# Patient Record
Sex: Male | Born: 1955 | Race: White | Hispanic: No | Marital: Married | Smoking: Never smoker
Health system: Southern US, Community
[De-identification: ages and names within clinical notes are randomized; demographics above are authoritative.]

## PROBLEM LIST (undated history)

## (undated) DIAGNOSIS — R011 Cardiac murmur, unspecified: Secondary | ICD-10-CM

## (undated) DIAGNOSIS — I4510 Unspecified right bundle-branch block: Secondary | ICD-10-CM

## (undated) DIAGNOSIS — I421 Obstructive hypertrophic cardiomyopathy: Secondary | ICD-10-CM

## (undated) DIAGNOSIS — I441 Atrioventricular block, second degree: Secondary | ICD-10-CM

## (undated) DIAGNOSIS — I442 Atrioventricular block, complete: Secondary | ICD-10-CM

## (undated) DIAGNOSIS — G35 Multiple sclerosis: Principal | ICD-10-CM

## (undated) DIAGNOSIS — R0602 Shortness of breath: Secondary | ICD-10-CM

## (undated) DIAGNOSIS — N400 Enlarged prostate without lower urinary tract symptoms: Secondary | ICD-10-CM

## (undated) HISTORY — DX: Benign prostatic hyperplasia without lower urinary tract symptoms: N40.0

## (undated) HISTORY — DX: Atrioventricular block, complete: I44.2

## (undated) HISTORY — DX: Multiple sclerosis: G35

## (undated) HISTORY — DX: Atrioventricular block, second degree: I44.1

## (undated) HISTORY — DX: Unspecified right bundle-branch block: I45.10

## (undated) HISTORY — DX: Shortness of breath: R06.02

## (undated) HISTORY — PX: OTHER SURGICAL HISTORY: SHX169

## (undated) HISTORY — DX: Obstructive hypertrophic cardiomyopathy: I42.1

## (undated) HISTORY — DX: Cardiac murmur, unspecified: R01.1

---

## 1996-07-06 DIAGNOSIS — G35 Multiple sclerosis: Secondary | ICD-10-CM

## 1996-07-06 HISTORY — DX: Multiple sclerosis: G35

## 2004-07-17 ENCOUNTER — Ambulatory Visit: Payer: Self-pay | Admitting: Internal Medicine

## 2004-07-21 ENCOUNTER — Ambulatory Visit: Payer: Self-pay | Admitting: Internal Medicine

## 2005-10-23 ENCOUNTER — Ambulatory Visit: Payer: Self-pay | Admitting: Hospitalist

## 2006-07-06 HISTORY — PX: LEG TENDON SURGERY: SHX1004

## 2006-07-18 ENCOUNTER — Emergency Department (HOSPITAL_COMMUNITY): Admission: EM | Admit: 2006-07-18 | Discharge: 2006-07-18 | Payer: Self-pay | Admitting: Emergency Medicine

## 2006-09-16 ENCOUNTER — Encounter (INDEPENDENT_AMBULATORY_CARE_PROVIDER_SITE_OTHER): Payer: Self-pay | Admitting: Internal Medicine

## 2006-10-30 ENCOUNTER — Emergency Department (HOSPITAL_COMMUNITY): Admission: EM | Admit: 2006-10-30 | Discharge: 2006-10-30 | Payer: Self-pay | Admitting: Emergency Medicine

## 2006-12-11 ENCOUNTER — Encounter (INDEPENDENT_AMBULATORY_CARE_PROVIDER_SITE_OTHER): Payer: Self-pay | Admitting: Internal Medicine

## 2007-01-12 ENCOUNTER — Encounter (INDEPENDENT_AMBULATORY_CARE_PROVIDER_SITE_OTHER): Payer: Self-pay | Admitting: Internal Medicine

## 2007-02-14 ENCOUNTER — Encounter (INDEPENDENT_AMBULATORY_CARE_PROVIDER_SITE_OTHER): Payer: Self-pay | Admitting: Internal Medicine

## 2007-07-07 DIAGNOSIS — I421 Obstructive hypertrophic cardiomyopathy: Secondary | ICD-10-CM

## 2007-07-07 HISTORY — DX: Obstructive hypertrophic cardiomyopathy: I42.1

## 2007-12-05 DIAGNOSIS — I442 Atrioventricular block, complete: Secondary | ICD-10-CM

## 2007-12-05 HISTORY — PX: PACEMAKER INSERTION: SHX728

## 2007-12-05 HISTORY — DX: Atrioventricular block, complete: I44.2

## 2007-12-12 ENCOUNTER — Telehealth (INDEPENDENT_AMBULATORY_CARE_PROVIDER_SITE_OTHER): Payer: Self-pay | Admitting: Internal Medicine

## 2007-12-12 ENCOUNTER — Encounter: Payer: Self-pay | Admitting: Cardiology

## 2007-12-12 ENCOUNTER — Ambulatory Visit: Payer: Self-pay | Admitting: Internal Medicine

## 2007-12-12 ENCOUNTER — Inpatient Hospital Stay (HOSPITAL_COMMUNITY): Admission: EM | Admit: 2007-12-12 | Discharge: 2007-12-16 | Payer: Self-pay | Admitting: Emergency Medicine

## 2007-12-12 ENCOUNTER — Ambulatory Visit: Payer: Self-pay | Admitting: Cardiology

## 2007-12-13 ENCOUNTER — Encounter: Payer: Self-pay | Admitting: Internal Medicine

## 2007-12-16 ENCOUNTER — Encounter: Payer: Self-pay | Admitting: Internal Medicine

## 2007-12-29 ENCOUNTER — Ambulatory Visit: Payer: Self-pay

## 2008-01-05 ENCOUNTER — Encounter (INDEPENDENT_AMBULATORY_CARE_PROVIDER_SITE_OTHER): Payer: Self-pay | Admitting: Internal Medicine

## 2008-01-05 ENCOUNTER — Ambulatory Visit: Payer: Self-pay | Admitting: Internal Medicine

## 2008-01-12 ENCOUNTER — Ambulatory Visit: Payer: Self-pay | Admitting: Internal Medicine

## 2008-01-12 ENCOUNTER — Ambulatory Visit: Payer: Self-pay

## 2008-01-12 ENCOUNTER — Encounter: Payer: Self-pay | Admitting: Cardiology

## 2008-01-12 ENCOUNTER — Encounter: Payer: Self-pay | Admitting: Internal Medicine

## 2008-01-13 ENCOUNTER — Ambulatory Visit: Payer: Self-pay

## 2008-01-30 ENCOUNTER — Ambulatory Visit: Payer: Self-pay | Admitting: Internal Medicine

## 2008-02-09 ENCOUNTER — Encounter (INDEPENDENT_AMBULATORY_CARE_PROVIDER_SITE_OTHER): Payer: Self-pay | Admitting: Internal Medicine

## 2008-02-13 ENCOUNTER — Ambulatory Visit (HOSPITAL_COMMUNITY): Admission: RE | Admit: 2008-02-13 | Discharge: 2008-02-13 | Payer: Self-pay | Admitting: Internal Medicine

## 2008-02-21 ENCOUNTER — Encounter (INDEPENDENT_AMBULATORY_CARE_PROVIDER_SITE_OTHER): Payer: Self-pay | Admitting: Internal Medicine

## 2008-03-20 ENCOUNTER — Ambulatory Visit: Payer: Self-pay | Admitting: Internal Medicine

## 2008-04-16 ENCOUNTER — Encounter (INDEPENDENT_AMBULATORY_CARE_PROVIDER_SITE_OTHER): Payer: Self-pay | Admitting: Internal Medicine

## 2008-04-18 ENCOUNTER — Ambulatory Visit: Payer: Self-pay

## 2008-04-18 ENCOUNTER — Encounter: Payer: Self-pay | Admitting: Cardiology

## 2008-04-25 ENCOUNTER — Ambulatory Visit: Payer: Self-pay | Admitting: Internal Medicine

## 2008-06-12 ENCOUNTER — Ambulatory Visit: Payer: Self-pay | Admitting: Internal Medicine

## 2008-06-14 ENCOUNTER — Ambulatory Visit: Payer: Self-pay

## 2008-08-31 ENCOUNTER — Ambulatory Visit: Payer: Self-pay | Admitting: Internal Medicine

## 2008-09-14 ENCOUNTER — Encounter: Payer: Self-pay | Admitting: Internal Medicine

## 2008-09-21 ENCOUNTER — Ambulatory Visit: Payer: Self-pay | Admitting: Internal Medicine

## 2008-09-21 ENCOUNTER — Encounter: Payer: Self-pay | Admitting: Internal Medicine

## 2008-10-18 ENCOUNTER — Telehealth (INDEPENDENT_AMBULATORY_CARE_PROVIDER_SITE_OTHER): Payer: Self-pay | Admitting: *Deleted

## 2008-11-02 ENCOUNTER — Ambulatory Visit: Payer: Self-pay

## 2008-11-02 ENCOUNTER — Encounter: Payer: Self-pay | Admitting: Internal Medicine

## 2008-12-18 ENCOUNTER — Ambulatory Visit: Payer: Self-pay | Admitting: Internal Medicine

## 2009-03-26 ENCOUNTER — Encounter (INDEPENDENT_AMBULATORY_CARE_PROVIDER_SITE_OTHER): Payer: Self-pay | Admitting: *Deleted

## 2009-03-28 ENCOUNTER — Ambulatory Visit: Payer: Self-pay | Admitting: Internal Medicine

## 2009-04-25 ENCOUNTER — Encounter: Payer: Self-pay | Admitting: Internal Medicine

## 2009-06-25 ENCOUNTER — Encounter: Payer: Self-pay | Admitting: Internal Medicine

## 2009-06-25 ENCOUNTER — Ambulatory Visit: Payer: Self-pay

## 2009-06-25 ENCOUNTER — Telehealth (INDEPENDENT_AMBULATORY_CARE_PROVIDER_SITE_OTHER): Payer: Self-pay | Admitting: *Deleted

## 2009-06-26 ENCOUNTER — Ambulatory Visit: Payer: Self-pay | Admitting: Internal Medicine

## 2009-07-08 ENCOUNTER — Telehealth: Payer: Self-pay | Admitting: Internal Medicine

## 2009-07-11 ENCOUNTER — Encounter: Payer: Self-pay | Admitting: Internal Medicine

## 2009-07-11 ENCOUNTER — Ambulatory Visit: Payer: Self-pay

## 2009-07-11 ENCOUNTER — Encounter: Payer: Self-pay | Admitting: Cardiovascular Disease

## 2009-07-11 ENCOUNTER — Encounter (HOSPITAL_COMMUNITY): Admission: RE | Admit: 2009-07-11 | Discharge: 2009-09-09 | Payer: Self-pay | Admitting: Internal Medicine

## 2009-07-11 ENCOUNTER — Ambulatory Visit: Payer: Self-pay | Admitting: Internal Medicine

## 2009-07-12 ENCOUNTER — Telehealth (INDEPENDENT_AMBULATORY_CARE_PROVIDER_SITE_OTHER): Payer: Self-pay | Admitting: *Deleted

## 2009-07-12 LAB — CONVERTED CEMR LAB
AST: 27 units/L (ref 0–37)
Bilirubin, Direct: 0 mg/dL (ref 0.0–0.3)
LDL Cholesterol: 111 mg/dL — ABNORMAL HIGH (ref 0–99)
Total Bilirubin: 1.2 mg/dL (ref 0.3–1.2)
VLDL: 18 mg/dL (ref 0.0–40.0)

## 2009-08-06 ENCOUNTER — Ambulatory Visit: Payer: Self-pay | Admitting: Internal Medicine

## 2009-08-07 ENCOUNTER — Telehealth: Payer: Self-pay | Admitting: Internal Medicine

## 2009-08-09 ENCOUNTER — Encounter: Payer: Self-pay | Admitting: Internal Medicine

## 2009-08-13 ENCOUNTER — Telehealth: Payer: Self-pay | Admitting: Internal Medicine

## 2009-08-19 ENCOUNTER — Encounter: Payer: Self-pay | Admitting: Cardiovascular Disease

## 2009-09-25 ENCOUNTER — Ambulatory Visit: Payer: Self-pay | Admitting: Internal Medicine

## 2009-10-01 ENCOUNTER — Encounter: Payer: Self-pay | Admitting: Internal Medicine

## 2009-11-06 ENCOUNTER — Encounter: Payer: Self-pay | Admitting: Internal Medicine

## 2009-11-13 ENCOUNTER — Encounter: Payer: Self-pay | Admitting: Internal Medicine

## 2010-01-28 ENCOUNTER — Ambulatory Visit: Payer: Self-pay | Admitting: Internal Medicine

## 2010-05-02 ENCOUNTER — Encounter: Payer: Self-pay | Admitting: Internal Medicine

## 2010-05-03 ENCOUNTER — Encounter: Payer: Self-pay | Admitting: Internal Medicine

## 2010-05-05 ENCOUNTER — Ambulatory Visit: Payer: Self-pay | Admitting: Internal Medicine

## 2010-05-21 ENCOUNTER — Encounter: Payer: Self-pay | Admitting: Internal Medicine

## 2010-07-24 ENCOUNTER — Telehealth: Payer: Self-pay | Admitting: Internal Medicine

## 2010-08-05 NOTE — Cardiovascular Report (Signed)
Summary: Office Visit Remote   Office Visit Remote   Imported By: Roderic Ovens 07/17/2009 14:57:01  _____________________________________________________________________  External Attachment:    Type:   Image     Comment:   External Document

## 2010-08-05 NOTE — Letter (Signed)
Summary: Remote Device Check  [employer], Main Office  [address]   [place], [place] [address]   Phone: [phone]  Fax: [phone]     July 11, 2009 MRN: [id number]   Rodney Adkins [address] [place], [place]  [address]   Dear Mr. Barbian,   Your remote transmission was recieved and reviewed by your physician.  All diagnostics were within normal limits for you.  __X___Your next transmission is scheduled for:     September 25, 2009.  Please transmit at any time this day.  If you have a wireless device your transmission will be sent automatically.     Sincerely,  Proofreader

## 2010-08-05 NOTE — Progress Notes (Signed)
Summary: [facility]   Imported By: Harlon Flor 08/28/2009 11:24:39  _____________________________________________________________________  External Attachment:    Type:   Image     Comment:   External Document

## 2010-08-05 NOTE — Letter (Signed)
Summary: Remote Device Check  [facility] [employer], Main Office  [address]   [place], [place] [address]   Phone: [phone]  Fax: [phone]     Nov 13, 2009 MRN: [id number]   Rodney Adkins [address] [place], [place]  [address]   Dear Mr. Bergland,   Your remote transmission was recieved and reviewed by your physician.  All diagnostics were within normal limits for you.   __X____Your next office visit is scheduled for:    August 2011. Please call our office to schedule an appointment.    Sincerely,  Vella Kohler

## 2010-08-05 NOTE — Letter (Signed)
Summary: Remote Device Check  [employer], Main Office  [address]   [place], [place] [address]   Phone: [phone]  Fax: [phone]     October 01, 2009 MRN: [id number]   JAYAN RAYMUNDO [address] [place], [place]  [address]   Dear Mr. Gunnarson,   Your remote transmission was recieved and reviewed by your physician.  All diagnostics were within normal limits for you.  __X___Your next transmission is scheduled for:   Nov 06, 2009.  Please transmit at any time this day.  If you have a wireless device your transmission will be sent automatically.      Sincerely,  Proofreader

## 2010-08-05 NOTE — Letter (Signed)
Summary: [facility] [employer], newspaper, Main Office  [address]   [place], [place] [address]   Phone: [phone]  Fax: [phone]     May 02, 2010 MRN: [id number]   ZACCARY CREECH [address] [place], [place]  [address]   Dear Mr. Theys,  According to our records, you were scheduled for a device phone transmission on 05-01-2010                             .     We did not receive any results from this check.  If you transmitted on your scheduled day, please call us to help troubleshoot your system.  If you forgot to send your transmission, please send one upon receipt of this letter.  Thank you,   Architectural technologist [facility]

## 2010-08-05 NOTE — Cardiovascular Report (Signed)
Summary: Office Visit   Office Visit   Imported By: Roderic Ovens 08/13/2009 11:22:45  _____________________________________________________________________  External Attachment:    Type:   Image     Comment:   External Document

## 2010-08-05 NOTE — Progress Notes (Signed)
Summary:  pt having SOB and wants to talk to someoone  Phone Note Call from Patient Call back at [phone]   Caller: Mom Reason for Call: Talk to Nurse, Talk to Doctor Summary of Call: pt is is having SOB and it is getting worse and he wants to talk to someone regarding this Initial call taken by: Omer Jack,  August 13, 2009 8:40 AM  Follow-up for Phone Call        S/W wife and they have an appt with Dr. Regino Schultze at [facility] but it isn't until Aprill 11, 2011. Mr. Bologna SOB was "Worse than it has ever been," yesterday. He is feeling some better today. Wants to know if there is anything that Dr. Graciela Husbands can do to get him in to see Dr. Regino Schultze any sooner. Also wants to know if they need to call the [facility] for Dr. Erenest Blank or if Dr. Graciela Husbands had gotten a change to contact him. She is aware that it will be late today when I s/w Dr. Graciela Husbands. Duncan Dull, RN, BSN  August 13, 2009 12:11 PM   Additional Follow-up for Phone Call Additional follow up Details #1::        Dr. Graciela Husbands s/w Meriam Sprague and let her know that he would be glad to get with Dr. Regino Schultze and let him know that SOB continues to be a problem with Mr. Ridlon. He also gave her the name of Dr. Forestine Na at [facility] whose specialty is Cardiac Genetics to call if she would like to call him in reference to gene testing.  Additional Follow-up by: Duncan Dull, RN, BSN,  August 13, 2009 6:17 PM

## 2010-08-05 NOTE — Cardiovascular Report (Signed)
Summary: Office Visit Remote   Office Visit Remote   Imported By: Roderic Ovens 10/03/2009 12:00:13  _____________________________________________________________________  External Attachment:    Type:   Image     Comment:   External Document

## 2010-08-05 NOTE — Progress Notes (Signed)
Summary:  regarding hocm  Phone Note Call from Patient Call back at Home Phone [phone] Call back at [phone]   Caller: Spouse-beverly Reason for Call: Talk to Nurse Details for Reason: pt was seen on yesterday, question regarding hocm - test.  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 I will s/w Dr. Graciela Husbands tomorrow.  Follow-up by: Duncan Dull, RN, BSN,  August 07, 2009 5:46 PM  Additional Follow-up for Phone Call Additional follow up Details #1::        Read next phone note Additional Follow-up by: Duncan Dull, RN, BSN,  August 13, 2009 6:17 PM

## 2010-08-05 NOTE — Progress Notes (Signed)
  Phone Note Outgoing Call   Call placed by: Duncan Dull, RN, BSN,  July 12, 2009 3:14 PM Call placed to: Patient Summary of Call: Called patient and left message on machine  To discuss Nuc. Study and labs Duncan Dull, RN, BSN  July 12, 2009 3:15 PM   Follow-up for Phone Call        Pt aware of Nuc Study and labs. Labs mailed at pt request Follow-up by: Duncan Dull, RN, BSN,  July 12, 2009 4:08 PM

## 2010-08-05 NOTE — Progress Notes (Signed)
[facility]: [facility]   Imported By: Harlon Flor 08/28/2009 11:25:04  _____________________________________________________________________  External Attachment:    Type:   Image     Comment:   External Document

## 2010-08-05 NOTE — Cardiovascular Report (Signed)
Summary: Office Visit Remote   Office Visit Remote   Imported By: Roderic Ovens 05/23/2010 14:04:45  _____________________________________________________________________  External Attachment:    Type:   Image     Comment:   External Document

## 2010-08-05 NOTE — Progress Notes (Signed)
  Phone Note Call from Patient Call back at Home Phone [phone] Call back at [phone]   Caller: Spouse Reason for Call: Talk to Nurse Summary of Call: chest tightness, pain and sob ongoing for a couple of weeks Initial call taken by: Migdalia Dk,  July 08, 2009 8:45 AM  Follow-up for Phone Call        Pt wants to see seen by a Cardiologist for a Nuc Stress Test to see if he has had an "event."  His appt with Dr. Graciela Husbands is not until Feb. I will s/w Dr. Graciela Husbands this afternoon and see what he suggests. Pt aware of plan.  Follow-up by: Duncan Dull, RN, BSN,  July 08, 2009 9:12 AM  Additional Follow-up for Phone Call Additional follow up Details #1::        S/w Dr. Graciela Husbands and pt was scheduled for a Nuc Stress and he will see Dr. Graciela Husbands as scheduled. Pt aware of and agrees with plan.  Additional Follow-up by: Duncan Dull, RN, BSN,  July 09, 2009 5:31 PM  New Problems: CHEST PAIN, INTERMITTENT (ICD-786.50)   New Problems: CHEST PAIN, INTERMITTENT (ICD-786.50)

## 2010-08-05 NOTE — Assessment & Plan Note (Signed)
Summary: rov/per pt call/jss   CC:  rov/ patient getting device checked and is reported lower than normal blood pressures..  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 After discussion with Dr. Regino Schultze at [facility] we undertook stress echo looking to see the effects on the mitral valve and left ventricular outflow tract. Nothing was noted by Dr. Myrtis Ser. he noted that there was a rapid heart rate with activity. The patient has intrinsic sinus function. He has had concerns about chest pain and recently underwent Myoview scanning that was normal with an ejection fraction of 65%.    He also has complaints of breathlessness occurring when he lies down at night. This occurred shortly after becoming recumbent. He then has to go to sleep propped up. His wife notes that sometimes he awakens 3-4 hours into the night and has to sit up to regain his breath. He continues to have some breathlessness with exertion. There has been no peripheral edema. They have noted that his blood pressures are relatively low at recent visits with systolics in the high 90s. He has not had significant lightheadedness dizziness or syncope     Current Medications (verified): 1)  Ativan 1 Mg  Tabs (Lorazepam) .... 1/2 To 1mg  At Night As Needed For Sleep 2)  Aspirin 81 Mg  Tbec (Aspirin) .... One By Mouth Every Day 3)  Fish Oil   Oil (Fish Oil) .... Qd 4)  Vitamin C 500 Mg  Tabs (Ascorbic Acid) .... By Mouth Bid 5)  Multivitamins  Tabs (Multiple Vitamin) .... Take One Tablet Once Daily  Allergies (verified): No Known Drug Allergies  Past History:  Past Medical History: Last updated: 09/18/2008 Multiple sclerosis dyspnea Murmur palpitation dizziness Flash pulmonary edema Right bundle-branch block HOCM  Past Surgical History: Last updated: 09/18/2008 pacemaker--Medtronic adapta ADDr4 --12/13/07  Family History: Last updated:  09/18/2008  His parents are both in their 66s and neither one has a   history of coronary artery disease.  There is no family history of   sudden death.  He has 1 brother who does not have heart disease.  There   is no family member that has required a pacemaker or other device of   which he is aware.   Social History: Last updated: 09/18/2008 Married  Tobacco Use - No.  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 HEENT Normal.  Neck veins were 7-carotids were brisk.  Lungs were clear.  Heart sounds were regular with a high-pitched 2-3/6 systolic murmur heard at the left lower sternal border that is accentuated by Valsalva and relieved with release Abdomen was soft with active bowel sounds. There is no clubbing cyanosis or edema. Skin Warm and dry    PPM Specifications Following MD:  Sherryl Manges, MD     PPM Vendor:  Medtronic     PPM Model Number:  ADDRL1  PPM Serial Number:  [id number] PPM DOI:  12/12/2007     PPM Implanting MD:  Sherryl Manges, MD  Lead 1    Location: RA     DOI: 12/12/2007     Model #: 9147     Serial #: [id number]     Status: active Lead 2    Location: RV     DOI: 12/12/2007     Model #: 6578     Serial #: [id number]     Status: active  Magnet Response Rate:  BOL 85 ERI  65  Indications:  CHB; HOCM  Explantation Comments:  Pacemaker dependent  PPM Follow Up Remote Check?  No Battery Voltage:  2.80 V     Battery Est. Longevity:  7.5 years     Pacer Dependent:  Yes       PPM Device Measurements Atrium  Amplitude: 5.6 mV,  Impedance: 545 ohms, Threshold: 0.375 V at 0.4 msec Right Ventricle  Impedance: 521 ohms, Threshold: 0.625 V at 0.4 msec  Episodes MS Episodes:  3     Percent Mode Switch:  <0.1%     Coumadin:  No Ventricular High Rate:  0     Atrial Pacing:  21.3%     Ventricular Pacing:  100%  Parameters Mode:  DDD     Lower Rate Limit:  60     Upper Rate Limit:  160 Paced AV Delay:  180     Sensed AV Delay:  150 Tech Comments:  Mode switch collection delay  changed to zero.  Carelink transmissions every 3 months.  ROV 6 months Dr. Graciela Husbands. Altha Harm, LPN  August 06, 2009 10:46 AM   Impression & Recommendations:  Problem # 1:  PAROXYSMAL NOCTURNAL DYSPNEA (ICD-786.09) I am bothered by this. He has mild jugular venous distention suggesting a little bit of right sided fluid overload. Recumbent dyspnea suggests left-sided fluid overload. He has normal left ventricular function this would be occurring in the context of diastolic abnormalities. We could certainly explain this related to his hypertrophic cardiopathy and possibly aggravation by pacing induced asynchrony. However, given the difficulties we have had with establishing a diagnosis I suggested that there may be value in having him seen by hypertrophic cardiomyopathy expert. We have discussed possibly going to the [facility] to see Dr. Erenest Blank.  For right now we will hold off on any symptomatic relief with either diuretics or nocturnal nitroglycerin as his blood pressures are low and I were a little bit about aggravation of his gradient His updated medication list for this problem includes:    Aspirin 81 Mg Tbec (Aspirin) ..... One by mouth every day  Problem # 2:  ATRIAL TACHYCARDIA (ICD-427.0) interrogation of his device demonstrated atrial tachycardia these were relatively brief there is a 2 episodes less than one minute one episode less than 10 minutes. Interestingly one of these occasions was associated with one to one antegrade conduction. I  do not think that this represented ventricular tachycardia as atrial rates were similar between episodes of conduction and heart block His updated medication list for this problem includes:    Aspirin 81 Mg Tbec (Aspirin) ..... One by mouth every day  Problem # 3:  PACEMAKER, PERMANENT (ICD-V45.01) Device parameters and data were reviewed .  we program the device to mode switch +0 his pulse  to MS + 30 to see if wecould  identify why was that the device is mode switching  Problem # 4:  ATRIOVENTRICULAR BLOCK,  COMPLETE (ICD-426.0) as above His updated medication list for this problem includes:    Aspirin 81 Mg Tbec (Aspirin) ..... 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 One by mouth every day

## 2010-08-05 NOTE — Letter (Signed)
Summary: Remote Device Check  [employer], Main Office  [address]   [place], [place] [address]   Phone: [phone]  Fax: [phone]     May 21, 2010 MRN: [id number]   ELENO WEIMAR [address] [place], [place]  [address]   Dear Mr. Sarwar,   Your remote transmission was recieved and reviewed by your physician.  All diagnostics were within normal limits for you.  __X___Your next transmission is scheduled for:   08-07-2010.  Please transmit at any time this day.  If you have a wireless device your transmission will be sent automatically.   Sincerely,  Vella Kohler

## 2010-08-05 NOTE — Cardiovascular Report (Signed)
Summary: Office Visit Remote   Office Visit Remote   Imported By: Roderic Ovens 11/13/2009 13:37:01  _____________________________________________________________________  External Attachment:    Type:   Image     Comment:   External Document

## 2010-08-05 NOTE — Progress Notes (Signed)
[facility]   Imported By: Harlon Flor 08/22/2009 12:41:18  _____________________________________________________________________  External Attachment:    Type:   Image     Comment:   External Document

## 2010-08-05 NOTE — Assessment & Plan Note (Signed)
Summary: pc2   CC:  pc2.  Pt states he is feeling well.l  He has no cardiac conerns at this time.  Marland Kitchen  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 After discussion with Dr. Regino Schultze at [facility] we undertook stress echo looking to see the effects on the mitral valve and left ventricular outflow tract. Nothing was noted by Dr. Cline Cools  the patient saw Dr. Regino Schultze at [facility] in February. evidence of outflow obstruction was noted by echo. Beta blocker therapy was recommended but never consummated by the patient. According to him and his wife the last 6 months however have been better with fewer symptoms than any of the preceding times.  The patient also notes that there is a certain sense of invalidism associated with the placement of the pacemaker    Allergies: No Known Drug Allergies  Past History:  Past Medical History: Last updated: 09/18/2008 Multiple sclerosis dyspnea Murmur palpitation dizziness Flash pulmonary edema Right bundle-branch block HOCM  Past Surgical History: Last updated: 09/18/2008 pacemaker--Medtronic adapta ADDr4 --12/13/07  Family History: Last updated: 09/18/2008  His parents are both in their 68s and neither one has a   history of coronary artery disease.  There is no family history of   sudden death.  He has 1 brother who does not have heart disease.  There   is no family member that has required a pacemaker or other device of   which he is aware.   Social History: Last updated: 09/18/2008 Married  Tobacco Use - No.  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 HEENT Normal.  Neck veins were 7-carotids were brisk.  Lungs were clear.  Heart sounds were regular with a high-pitched 2-3/6 systolic murmur heard at the left lower sternal border that is accentuated by Valsalva and relieved with release Abdomen was soft with active bowel sounds. There is no clubbing cyanosis or edema. Skin Warm and dry    PPM Specifications Following MD:  Sherryl Manges, MD     PPM Vendor:  Medtronic     PPM Model Number:  ADDRL1     PPM Serial Number:  [id number] PPM DOI:  12/12/2007     PPM Implanting MD:  Sherryl Manges, MD  Lead 1    Location: RA     DOI: 12/12/2007     Model #: 9147     Serial #: [id number]     Status: active Lead 2    Location: RV     DOI: 12/12/2007     Model #: 6578     Serial #: [id number]     Status: active  Magnet Response Rate:  BOL 85 ERI  65  Indications:  CHB; HOCM  Explantation Comments:  Pacemaker dependent  PPM Follow Up Remote Check?  No Battery Voltage:  2.80 V     Battery Est. Longevity:  10 years     Pacer Dependent:  Yes       PPM Device Measurements Atrium  Amplitude: 2.8 mV, Impedance: 563 ohms, Threshold: 0.5 V at 0.4 msec Right Ventricle  Impedance: 480 ohms, Threshold: 0.625 V at 0.4 msec  Episodes MS Episodes:  1     Percent Mode Switch:  <0.1%     Coumadin:  No Ventricular High Rate:  0     Atrial Pacing:  17.7%     Ventricular Pacing:  100%  Parameters Mode:  DDD     Lower Rate Limit:  60     Upper Rate Limit:  160 Paced AV Delay:  180     Sensed AV Delay:  150 Next Remote Date:  05/01/2010     Next Cardiology Appt Due:  01/04/2011 Tech Comments:  No parameter changes.  1 mode switch lasting > 1 hour, - coumadin.  Carelink transmissions every 3 months.  ROV 1 year with Dr. Graciela Husbands. Altha Harm, LPN  January 28, 2010 4:52 PM   Impression & Recommendations:  Problem # 1:  HYPERTROPHIC OBSTRUCTIVE  CARDIOMYOPATHY (ICD-425.1) the patient is doing relatively well. He has not taken his beta blocker. Review of his histogram shows that about 20% of his heartbeats are faster than 100 beats per minute. We discussed the physiology of diastolic dysfunction and its relationship to exercise performance. He is not interested at this point given the fact that he is doing well to try beta blockers because of the side effects. We have decided to hold off right now. The following medications were removed from the medication list:    Metoprolol Tartrate 25 Mg Tabs (Metoprolol tartrate) .Marland Kitchen... Take 1/2 tablet by mouth daily His updated medication list for this problem includes:    Aspirin 81 Mg Tbec (Aspirin) ..... One by mouth every day  Problem # 2:  ATRIOVENTRICULAR BLOCK, COMPLETE (ICD-426.0) .stable on current meds The following medications were removed from the medication list:    Metoprolol Tartrate 25 Mg Tabs (Metoprolol tartrate) .Marland Kitchen... Take 1/2 tablet by mouth daily His updated medication list for this problem includes:    Aspirin 81 Mg Tbec (Aspirin) ..... One by mouth every day  Problem # 3:  PAROXYSMAL NOCTURNAL DYSPNEA (ICD-786.09) dyspnea is stable  The following medications were removed from the medication list:    Metoprolol Tartrate 25 Mg Tabs (Metoprolol tartrate) .Marland Kitchen... Take 1/2 tablet by mouth daily His updated medication list for this problem includes:    Aspirin 81 Mg Tbec (Aspirin) ..... One by mouth every day  Problem # 4:  PACEMAKER, PERMANENT (ICD-V45.01) Device parameters and data were reviewed and no changes were made  Patient Instructions: 1)  Your physician wants you to follow-up in: March 2012 with Dr Graciela Husbands.  You will receive a reminder letter in the mail two months in advance. If you don't receive a letter, please call our office to schedule the follow-up appointment.

## 2010-08-05 NOTE — Assessment & Plan Note (Signed)
Summary: Cardiology Nuclear Study  Nuclear Med Background Indications for Stress Test: Evaluation for Ischemia   History: Echo, Myocardial Perfusion Study, Pacemaker  History Comments: 6/09 PTVP; 7/09 Rest Thallium:small infero-septal defect, ? thinning; 11/02/08 Stress Echo:normal, normal LVF; h/o hypertrophic obstructive cardiomyopathy  Symptoms: Chest Pain, Chest Pain with Exertion, Chest Tightness, Chest Tightness with Exertion, DOE, Palpitations, Rapid HR, SOB  Symptoms Comments: Last episode of CP:2 days ago.   Nuclear Pre-Procedure Caffeine/Decaff Intake: none NPO After: 9:00 PM Lungs: Clear IV 0.9% NS with Angio Cath: 20g     IV Site: (R) AC IV Started by: Stanton Kidney EMT-P Chest Size (in) 38-40     Height (in): 70 Weight (lb): 144 BMI: 20.74 Tech Comments: NO CARDIAC RISK FACTORS  Nuclear Med Study 1 or 2 day study:  1 day     Stress Test Type:  Adenosine Reading MD:  Dietrich Pates, MD     Referring MD:  Berton Mount, MD Resting Radionuclide:  Technetium 28m Tetrofosmin     Resting Radionuclide Dose:  11.0 mCi  Stress Radionuclide:  Technetium 4m Tetrofosmin     Stress Radionuclide Dose:  33.0 mCi   Stress Protocol  Dose of Adenosine:  36.7 mg    Stress Test Technologist:  Rea College CMA-N     Nuclear Technologist:  Burna Mortimer Deal RT-N  Rest Procedure  Myocardial perfusion imaging was performed at rest 45 minutes following the intravenous administration of Myoview Technetium 75m Tetrofosmin.  Stress Procedure  The patient received IV adenosine at 140 mcg/kg/min for 4 minutes. There were no significant changes with infusion. He did c/o chest tightness in recovery of  infusion.  Myoview was injected at the 2 minute mark and quantitative spect images were obtained after a 45 minute delay.  QPS Raw Data Images:  Stress images were motion corrected.  Note soft tissue (diaphragm, bowel activity) underlie heart. Stress Images:  Thinning with decreased counts in the  inferoseptal region (base.)  Note bowel activity overlies heart inferiorly. Rest Images:  Thinning in the inferoseptal region more prominent than in stress images (base, mid, minimally distal) Transient Ischemic Dilatation:  1.12  (Normal <1.22)  Lung/Heart Ratio:  .23  (Normal <0.45)  Quantitative Gated Spect Images QGS EDV:  90 ml QGS ESV:  32 ml QGS EF:  64 %   Overall Impression  Exercise Capacity: Adenosine study with no exercise. BP Response: Normal blood pressure response. Clinical Symptoms: Mild chest pain/dyspnea. ECG Impression: Nondiagnositic because of pacing activity Overall Impression: Probable normal perfusion and soft tissue attenuation (bowel activity, diaphragm).  No evidence of significant ischemia. Cannot completely excluded small subendocardial scar.   Overall low risk scan.  Appended Document: Cardiology Nuclear Study Pt requested after having a couple weeks of CP on and off. Pt aware of results Has f/u first week of Feb

## 2010-08-07 ENCOUNTER — Encounter (INDEPENDENT_AMBULATORY_CARE_PROVIDER_SITE_OTHER): Payer: BC Managed Care – PPO

## 2010-08-07 ENCOUNTER — Encounter: Payer: Self-pay | Admitting: Internal Medicine

## 2010-08-07 ENCOUNTER — Ambulatory Visit: Admit: 2010-08-07 | Payer: Self-pay | Admitting: Internal Medicine

## 2010-08-07 DIAGNOSIS — I495 Sick sinus syndrome: Secondary | ICD-10-CM

## 2010-08-07 NOTE — Progress Notes (Signed)
Summary: ref to pulmonary  Phone Note Call from Patient Call back at Home Phone [phone]   Caller: Patient Reason for Call: Talk to Nurse Summary of Call: pt would to be ref to a pulmonary dr. Initial call taken by: Roe Coombs,  July 24, 2010 10:54 AM  Follow-up for Phone Call        lmfcb, adv pt need more info and then will see if Dr. Graciela Husbands wants to ok referral next week.  Follow-up by: Claris Gladden RN,  July 24, 2010 3:23 PM  Additional Follow-up for Phone Call Additional follow up Details #1::        pt rtn your call you can reach him at [phone] Omer Jack  July 28, 2010 11:08 AM     Additional Follow-up for Phone Call Additional follow up Details #2::    see DR Marchelle Gearing Follow-up by: Nathen May, MD, [facility],  July 28, 2010 5:47 PM   Appended Document: ref to pulmonary    Clinical Lists Changes  Orders: Added new Referral order of Pulmonary Referral (Pulmonary) - Signed      Appended Document: ref to pulmonary adv Mrs. Lenis Noon of referal. Claris Gladden, RN, BSN

## 2010-08-14 ENCOUNTER — Institutional Professional Consult (permissible substitution): Payer: Self-pay | Admitting: Internal Medicine

## 2010-08-19 ENCOUNTER — Ambulatory Visit (INDEPENDENT_AMBULATORY_CARE_PROVIDER_SITE_OTHER): Payer: BC Managed Care – PPO | Admitting: Internal Medicine

## 2010-08-19 ENCOUNTER — Encounter: Payer: Self-pay | Admitting: Internal Medicine

## 2010-08-19 ENCOUNTER — Encounter (INDEPENDENT_AMBULATORY_CARE_PROVIDER_SITE_OTHER): Payer: BC Managed Care – PPO | Admitting: Internal Medicine

## 2010-08-19 DIAGNOSIS — I421 Obstructive hypertrophic cardiomyopathy: Secondary | ICD-10-CM

## 2010-08-19 DIAGNOSIS — Z950 Presence of cardiac pacemaker: Secondary | ICD-10-CM

## 2010-08-19 DIAGNOSIS — I442 Atrioventricular block, complete: Secondary | ICD-10-CM

## 2010-08-25 ENCOUNTER — Encounter (INDEPENDENT_AMBULATORY_CARE_PROVIDER_SITE_OTHER): Payer: Self-pay | Admitting: *Deleted

## 2010-08-27 NOTE — Assessment & Plan Note (Signed)
Summary: Per patient request/sl   History of Present Illness: Dr. [name] comes in in followup for his hypertrophic cardiomyopathy associated with exercise intolerance. After discussion with Dr. Regino Adkins at [facility] we undertook stress echo looking to see the effects on the mitral valve and left ventricular outflow tract. Nothing was noted by Dr. Cline Adkins  the patient saw Dr. Regino Adkins at [facility] in February. evidence of outflow obstruction was noted by echo. Beta blocker therapy was recommended but never consummated by the patient.Hcontinues to have problems with dyspnea on exertion. The beta blockers previously recommended were not undertaken.      Current Medications (verified): 1)  Ativan 1 Mg  Tabs (Lorazepam) .... 1/2 To 1mg  At Night As Needed For Sleep-Very Rarely 2)  Aspirin 81 Mg  Tbec (Aspirin) .... One By Mouth Every Day 3)  Fish Oil   Oil (Fish Oil) .... Qd 4)  Vitamin C 500 Mg  Tabs (Ascorbic Acid) .... By Mouth Bid 5)  Multivitamins  Tabs (Multiple Vitamin) .... Take One Tablet Once Daily  Allergies (verified): No Known Drug Allergies  Past History:  Past Medical History: Last updated: 09/18/2008 Multiple sclerosis dyspnea Murmur palpitation dizziness Flash pulmonary edema Right bundle-branch block HOCM  Past Surgical History: Last updated: 09/18/2008 pacemaker--Medtronic adapta ADDr4 --12/13/07  Family History: Last updated: 09/18/2008  His parents are both in their 36s and neither one has a   history of coronary artery disease.  There is no family history of   sudden death.  He has 1 brother who does not have heart disease.  There   is no family member that has required a pacemaker or other device of   which he is aware.   Social History: Last updated: 09/18/2008 Married  Tobacco Use - No.  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 HEENT Normal.  Neck veins were 7-carotids were brisk.  Lungs were clear.  Heart sounds were regular with a high-pitched 2-3/6 systolic murmur heard at the left lower sternal border that is accentuated by Valsalva and relieved with release Abdomen was soft with active bowel sounds. There is no clubbing cyanosis or edema. Skin Warm and dry    PPM Specifications Following MD:  Rodney Manges, MD     PPM Vendor:  Medtronic     PPM Model Number:  [id number]     PPM Serial Number:  [id number] PPM DOI:  12/12/2007     PPM Implanting MD:  Rodney Manges, MD  Lead 1    Location: RA     DOI: 12/12/2007     Model #: [id number]     Serial #: [id number]     Status: active Lead 2    Location: RV     DOI: 12/12/2007     Model #: [id number]     Serial #: [id number]     Status: active  Magnet Response Rate:  BOL 85 ERI  65  Indications:  CHB; HOCM  Explantation  Comments:  Pacemaker dependent  PPM Follow Up Pacer Dependent:  Yes      Episodes Coumadin:  No  Parameters Mode:  DDD     Lower Rate Limit:  60     Upper Rate Limit:  160 Paced AV Delay:  180     Sensed AV Delay:  150  Impression & Recommendations:  Problem # 1:  HYPERTROPHIC OBSTRUCTIVE CARDIOMYOPATHY (ICD-425.1)  Device parameters and data were reviewed and no changes were made   His updated medication list for this problem includes:    Aspirin 81 Mg Tbec (Aspirin) ..... One by mouth every day    Atenolol 50 Mg Tabs (Atenolol) .Marland Kitchen... As directed    Metoprolol Succinate 50 Mg Xr24h-tab (Metoprolol succinate) .Marland Kitchen... As directed    Corgard 40 Mg Tabs (Nadolol) .Marland Kitchen... As directed  His updated medication list for this problem includes:    Aspirin 81 Mg Tbec (Aspirin)  ..... One by mouth every day    Atenolol 50 Mg Tabs (Atenolol) .Marland Kitchen... As directed  Problem # 2:  PACEMAKER, PERMANENT (ICD-V45.01) Device parameters and data were reviewed and no changes were made  Problem # 3:  ATRIOVENTRICULAR BLOCK, COMPLETE (ICD-426.0)  stable His updated medication list for this problem includes:    Aspirin 81 Mg Tbec (Aspirin) ..... One by mouth every day    Atenolol 50 Mg Tabs (Atenolol) .Marland Kitchen... As directed    Metoprolol Succinate 50 Mg Xr24h-tab (Metoprolol succinate) .Marland Kitchen... As directed    Corgard 40 Mg Tabs (Nadolol) .Marland Kitchen... As directed  His updated medication list for this problem includes:    Aspirin 81 Mg Tbec (Aspirin) ..... One by mouth every day    Atenolol 50 Mg Tabs (Atenolol) .Marland Kitchen... As directed    Metoprolol Succinate 50 Mg Xr24h-tab (Metoprolol succinate) .Marland Kitchen... As directed  Problem # 4:  DYSPNEA ON EXERTION (ICD-786.09)  as above; we will also get a cardiopulmonary stress test results from [facility] His updated medication list for this problem includes:    Aspirin 81 Mg Tbec (Aspirin) ..... One by mouth every day    Atenolol 50 Mg Tabs (Atenolol) .Marland Kitchen... As directed    Metoprolol Succinate 50 Mg Xr24h-tab (Metoprolol succinate) .Marland Kitchen... As directed    Corgard 40 Mg Tabs (Nadolol) .Marland Kitchen... As directed  His updated medication list for this problem includes:    Aspirin 81 Mg Tbec (Aspirin) ..... One by mouth every day    Atenolol 50 Mg Tabs (Atenolol) .Marland Kitchen... As directed    Metoprolol Succinate 50 Mg Xr24h-tab (Metoprolol succinate) .Marland Kitchen... As directed    Corgard 40 Mg Tabs (Nadolol) .Marland Kitchen... As directed  Patient Instructions: 1)  Your physician recommends that you schedule a follow-up appointment in: 6 MONTHS WITH DR Rodney Adkins 2)  Your physician has recommended you make the following change in your medication: 3 scripts given take as directed Prescriptions: CORGARD 40 MG TABS (NADOLOL) as directed  #14 x 0   Entered by:   Scherrie Bateman, LPN   Authorized by:   Nathen May, MD, [facility]   Signed by:   Scherrie Bateman, LPN on 21/30/8657   Method used:   Print then Give to Patient   RxID:   [id number] METOPROLOL SUCCINATE 50 MG XR24H-TAB (METOPROLOL SUCCINATE) as directed  #14 x 0   Entered by:   Scherrie Bateman, LPN   Authorized by:   Nathen May, MD, [facility]   Signed by:   Scherrie Bateman, LPN on 07/08/7251   Method used:  Print then Give to Patient   RxID:   [id number] ATENOLOL 50 MG TABS (ATENOLOL) as directed  #14 x 0   Entered by:   Scherrie Bateman, LPN   Authorized by:   Nathen May, MD, [facility]   Signed by:   Scherrie Bateman, LPN on 69/62/9528   Method used:   Print then Give to Patient   RxID:   [id number]

## 2010-08-29 ENCOUNTER — Encounter: Payer: Self-pay | Admitting: Internal Medicine

## 2010-08-29 DIAGNOSIS — I4510 Unspecified right bundle-branch block: Secondary | ICD-10-CM | POA: Insufficient documentation

## 2010-08-29 DIAGNOSIS — I442 Atrioventricular block, complete: Secondary | ICD-10-CM

## 2010-08-29 DIAGNOSIS — I421 Obstructive hypertrophic cardiomyopathy: Secondary | ICD-10-CM | POA: Insufficient documentation

## 2010-08-29 DIAGNOSIS — G35 Multiple sclerosis: Secondary | ICD-10-CM | POA: Insufficient documentation

## 2010-09-01 ENCOUNTER — Institutional Professional Consult (permissible substitution) (INDEPENDENT_AMBULATORY_CARE_PROVIDER_SITE_OTHER): Payer: BC Managed Care – PPO | Admitting: Internal Medicine

## 2010-09-01 ENCOUNTER — Encounter: Payer: Self-pay | Admitting: Internal Medicine

## 2010-09-01 DIAGNOSIS — R05 Cough: Secondary | ICD-10-CM

## 2010-09-01 DIAGNOSIS — R0602 Shortness of breath: Secondary | ICD-10-CM

## 2010-09-02 NOTE — Letter (Signed)
Summary: Remote Device Check  [employer], Main Office  [address]   [place], [place] [address]   Phone: [phone]  Fax: [phone]     August 25, 2010 MRN: [id number]   Rodney Adkins [address] [place], [place]  [address]   Dear Mr. Grunert,   Your remote transmission was recieved and reviewed by your physician.  All diagnostics were within normal limits for you.  __X____Your next office visit is scheduled for:  July 2012 with Dr Graciela Husbands. Please call our office to schedule an appointment.    Sincerely,  Vella Kohler

## 2010-09-02 NOTE — Cardiovascular Report (Signed)
Summary: Office Visit   Office Visit   Imported By: Roderic Ovens 08/29/2010 09:04:28  _____________________________________________________________________  External Attachment:    Type:   Image     Comment:   External Document

## 2010-09-02 NOTE — Cardiovascular Report (Signed)
Summary: Office Visit Remote   Office Visit Remote   Imported By: Roderic Ovens 08/29/2010 11:49:58  _____________________________________________________________________  External Attachment:    Type:   Image     Comment:   External Document

## 2010-09-11 NOTE — Assessment & Plan Note (Addendum)
Summary: CONSULT FOR DYSPNEA PER DR KLINE//SH   Visit Type:  Initial Consult Copy to:  Dr. Sherryl Manges Primary Provider/Referring Provider:  none  CC:  Pulmonary Consult for SOB with activity x 2 years. .  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 Kown Heart Block on Pacer since 2009. Childhood asthma. Muld multiple sclerosis  Presents with wife. They state that with each viral URI  (happens 1-2 a year)he has a nasty, cough that lasts 3-4 weeks associated with wheeze/raspiness according to wife. STates these episodes have been going on since early to mid 2000s. Runs in families. Most recent episode was xmas 2011 and cough resolved early Feb 2012. in addition since 2005 (even befor CHF) he has mild dry  cough on many occassions  when he lies down to sleep but does not bother him during sleep and it goes away after he lies down for a while.   Also, c/o dyspnea since 2008. Initially insidious onset and with exertion like walking dog and then got worse. In 2009 dx with complete heart block and shock and pacer was placed. Did need multople adjustments on pacer 2009-2010 for exertional dyspnea and had  improvement in dyspnea but  then hit plateau. Dyspnea brought on by exertion like stairs, or walking up a hill.  Dyspnea improved by resting but paradoxically will have rare episodes of subjective dyspnea at night during sleep but they deny it is parozysmal nocturnal dyspnea. Subsequently referred to Dr. Regino Schultze at [facility] in Aug 2009. Recollects Treadmill CPST at [facility] in 2009 but ? results - told it was fine per hx. Last saw Dr. Regino Schultze Feb 2011 - noted to have LVH, normal EF but also  LVOT worsened with valsalva. Advised beta blocker but never took.  Other workup: August 2009 - normal CT chest. Jan 2011: Low risk nuclear medicine stress test  Preventive Screening-Counseling & Management  Alcohol-Tobacco     Smoking Status: never  Caffeine-Diet-Exercise   Does Patient Exercise: yes     Type of exercise: WALKSDOG     Times/week: 8+  Current Medications (verified): 1)  Aspirin 81 Mg  Tbec (Aspirin) .... One By Mouth Every Day 2)  Fish Oil   Oil (Fish Oil) .... Qd 3)  Vitamin C 500 Mg  Tabs (Ascorbic Acid) .... By Mouth Bid 4)  Multivitamins  Tabs (Multiple Vitamin) .... Take One Tablet Once Daily 5)  Metoprolol Succinate 50 Mg Xr24h-Tab (Metoprolol Succinate) .... As Directed 6)  Corgard 40 Mg Tabs (Nadolol) .... As Directed  Allergies (verified): No Known Drug Allergies  Past History:  Past Medical History: Multiple sclerosis dyspnea Murmur palpitation dizziness Flash pulmonary edema Right bundle-branch block HOCM  - echo fbe 2011:   Social History: Married  Tobacco Use - No.  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  The patient denies shortness of breath at rest, productive cough, non-productive cough, coughing up blood, chest pain, irregular heartbeats, acid heartburn, indigestion, loss of appetite, weight change, abdominal pain, difficulty swallowing, sore throat, tooth/dental problems, headaches, nasal congestion/difficulty breathing through nose, sneezing, itching, ear ache, anxiety, depression, hand/feet swelling, joint stiffness or pain, rash, change in color of  mucus, and fever.    Vital Signs:  Patient profile:   55 year old male Height:      70 inches Weight:      150.13 pounds BMI:     21.62 O2 Sat:      97 % on Room air Temp:     98.1 degrees F oral Pulse rate:   76 / minute BP sitting:   110 / 60  (left arm) Cuff size:   regular  Vitals Entered By: Carron Curie CMA (September 01, 2010 10:23 AM)  O2 Flow:  Room air  Serial Vital  Signs/Assessments:  Comments: Ambulatory Pulse Oximetry  Resting; HR__73___    02 Sat__99___  Lap1 (185 feet)   HR_106____   02 Sat__98___ Lap2 (185 feet)   HR__117___   02 Sat__98___    Lap3 (185 feet)   HR___122__   02 Sat__97___  __x_Test Completed without Difficulty ___Test Stopped due to:   By: Carron Curie CMA    Physical Exam  General:  well developed, well nourished, in no acute distress Head:  normocephalic and atraumatic Eyes:  PERRLA/EOM intact; conjunctiva and sclera clear Ears:  TMs intact and clear with normal canals Nose:  no deformity, discharge, inflammation, or lesions Mouth:  no deformity or lesions Neck:  no masses, thyromegaly, or abnormal cervical nodes Chest Wall:  no deformities noted Lungs:  clear bilaterally to auscultation and percussion Heart:  regular rhythm, normal rate, no rubs, no gallops, and Grade  3/6  systolic murmur loudest at apex.   Abdomen:  bowel sounds positive; abdomen soft and non-tender without masses, or organomegaly Msk:  no deformity or scoliosis noted with normal posture Pulses:  pulses normal Extremities:  no clubbing, cyanosis, edema, or deformity noted Neurologic:  CN II-XII grossly intact with normal reflexes, coordination, muscle strength and tone Skin:  intact without lesions or rashes Cervical Nodes:  no significant adenopathy Axillary Nodes:  no significant adenopathy Psych:  alert and cooperative; normal mood and affect; normal attention span and concentration   MISC. Report  Procedure date:  09/01/2010  Findings:      see HPI for images and results that I personally reviewed  Impression & Recommendations:  Problem # 1:  COUGH (ICD-786.2) Assessment New  Ddx is GERD, sillent nasal drainage both of which he denies or asthma which he and wife think are possibilities due to hx of it in childhood, wheeze and prlolnged cough after viral illness. I wil get full PFT and reassess  Orders: Consultation  Level V ([address])  Problem # 2:  SHORTNESS OF BREATH (SOB) (ICD-786.05) Assessment: New  Ddx is related to pacer inability to meet with demand of exercise, LV outlet obstruction wtih murmur, multiple sclerosis related restriction, ? asthma though I doubt this, and deconditioning  PLAN get old CPST report from [facility] get full PFTs based on above might need methacholine challenge test and/or repeat CPST - will need to know from Dr. Graciela Husbands if safe to do CPST because American Thor[employer] Society lists HOCM as relative contraindication  Orders: Consultation Level V [phone])  Patient Instructions: 1)  please have breathing test called PFT 2)   - call me after above or email me to discuss 3)    - I will discuss with Dr. Graciela Husbands about getting CPST bike test 4)  Please sign release to get CPST results from [facility] 2009-2010  Appended Document: CONSULT FOR DYSPNEA PER DR KLINE//SH i need to fu with Dr Graciela Husbands  Appended Document: CONSULT FOR  DYSPNEA PER DR KLINE//SH has he had his PFTs ? I called him to discuss but lmtcb. Dr. Graciela Husbands and Dr. Teressa Lower ok with doing CPST. Please get me his CPSTs from [facility]. Need them to compare once I order one here

## 2010-09-16 ENCOUNTER — Encounter (INDEPENDENT_AMBULATORY_CARE_PROVIDER_SITE_OTHER): Payer: BC Managed Care – PPO

## 2010-09-16 ENCOUNTER — Encounter: Payer: Self-pay | Admitting: Internal Medicine

## 2010-09-16 DIAGNOSIS — R05 Cough: Secondary | ICD-10-CM

## 2010-09-16 DIAGNOSIS — R059 Cough, unspecified: Secondary | ICD-10-CM

## 2010-09-16 DIAGNOSIS — R0609 Other forms of dyspnea: Secondary | ICD-10-CM

## 2010-09-16 LAB — PULMONARY FUNCTION TEST

## 2010-09-22 ENCOUNTER — Telehealth: Payer: Self-pay | Admitting: Internal Medicine

## 2010-09-22 ENCOUNTER — Encounter: Payer: Self-pay | Admitting: Internal Medicine

## 2010-09-23 ENCOUNTER — Telehealth: Payer: Self-pay | Admitting: *Deleted

## 2010-09-23 DIAGNOSIS — R0602 Shortness of breath: Secondary | ICD-10-CM

## 2010-09-23 NOTE — Assessment & Plan Note (Signed)
Summary: pft charges   Allergies: No Known Drug Allergies   Other Orders: Carbon Monoxide diffusing w/capacity (94729) Lung Volumes/Gas dilution or washout (94727) Spirometry (Pre & Post) (94060) 

## 2010-09-23 NOTE — Telephone Encounter (Signed)
Phone Note  Outgoing Call   Summary of Call: pfts 09/16/2010 is normal. pls let him know result. Need to do repeat CPST with EIB Testing. Order done. Pls give fu after CPST. ensure EIB testing with CPST Initial call taken by: Kalman Shan MD,  September 22, 2010 6:36 PM    Order placed for CPST. Pt aware. Carron Curie, MA

## 2010-09-24 NOTE — Telephone Encounter (Signed)
cpst scheduled@[facility]  10/06/10@1 :30pm Oneita Jolly

## 2010-09-29 ENCOUNTER — Telehealth: Payer: Self-pay | Admitting: Internal Medicine

## 2010-09-29 NOTE — Telephone Encounter (Signed)
lmomtcb x1. Per libby pt can call [phone] to reschedule his cpst  Carver Fila, CMA

## 2010-09-30 ENCOUNTER — Telehealth: Payer: Self-pay | Admitting: Internal Medicine

## 2010-09-30 NOTE — Telephone Encounter (Signed)
Spoke with patient-has already [facility] appt.

## 2010-09-30 NOTE — Telephone Encounter (Signed)
Victorino Dike please advise if you have seen pt's pft results. Thanks  Carver Fila, CMA

## 2010-10-01 ENCOUNTER — Encounter: Payer: Self-pay | Admitting: Internal Medicine

## 2010-10-01 NOTE — Telephone Encounter (Signed)
Printed results mailed to pt per pt request. Carron Curie, CMA

## 2010-10-02 NOTE — Progress Notes (Signed)
Summary: pft normal, needs EIB  Phone Note Outgoing Call   Summary of Call: pfts 09/16/2010 is normal. pls let him know result. Need to do repeat CPST with EIB Testing. Order done. Pls give fu after CPST. ensure EIB testing with CPST Initial call taken by: Kalman Shan MD,  September 22, 2010 6:36 PM  Follow-up for Phone Call        order placed to [facility]...see epic. Carron Curie CMA  September 23, 2010 5:43 PM

## 2010-10-02 NOTE — Letter (Signed)
Summary: CPST Network engineer Pulmonary  [address]   [place], [place] [address]   Phone: [phone]  Fax: [phone]     Patient's Name: Rodney Adkins Date of Birth: 16-Jul-1955 MRN: [id number]  CPST  Choose test method and choice  a)__x_Bike - recommended by ATS/ACCP. Do at [facility] at Dr. Gala Romney [facility]  b)___Treadmill - less preferred. Do at [facility] at Dr. Gala Romney lab or do at [facility] PFT lab  Choose one or more indication for test  INDICATIONS FOR CARDIOPULMONARY EXERCISE TESTING Evaluation of exercise tolerance __x____ Determination of functional impairment or capacity (peak V? O2) ___x___ Determination of exercise-limiting factors and pathophysiologic mechanisms  Evaluation of undiagnosed exercise intolerance __x___ Assessing contribution of cardiac and pulmonary etiology in coexisting disease __x___ Symptoms disproportionate to resting pulmonary and cardiac tests  __x___Unexplained dyspnea when initial cardiopulmonary testing is nondiagnostic  Evaluation of patients with cardiovascular disease _____ Functional evaluation and prognosis in patients with heart failure _____ Selection for cardiac transplantation _____ Exercise prescription and monitoring response to exercise training for cardiac rehabilitation (special circumstances; i.e., pacemakers)  Evaluation of patients with respiratory disease _____ Functional impairment assessment (see specific clinical applications)  _____Chronic obstructive pulmonary disease Establishing exercise limitation(s) and assessing other potential contributing factors, especially occult heart disease (ischemia) ______Determination of magnitude of hypoxemia and for O2 prescription When objective determination of therapeutic intervention is necessary and not adequately addressed by standard pulmonary function testing  _____ Interstitial lung diseases _____Detection of early (occult) gas exchange abnormalities _____Overall  assessment/monitoring of pulmonary gas exchange _____Determination of magnitude of hypoxemia and for O2 prescription _____Determination of potential exercise-limiting factors _____Documentation of therapeutic response to potentially toxic therapy  ____ Pulmonary vascular disease (careful risk-benefit analysis required)  ____ Cystic fibrosis  _x___ Exercise-induced bronchospasm  Specific clinical applications ____  Preoperative evaluation _____Lung resectional surgery _____Elderly patients undergoing major abdominal surgery _____Lung volume resectional surgery for emphysema (currently investigational)  ____ Exercise evaluation and prescription for pulmonary rehabilitation  ____ Evaluation for impairment-disability  ____ Evaluation for lung, heart-lung transplantation ____ Definition of abbreviation: V? O2______ -oxygen consumption.    Kalman Shan MD    [facility]

## 2010-10-02 NOTE — Letter (Signed)
Summary: CPST- R/O Contraindications  [facility]  [address]   [place], [place] [address]   Phone: [phone]  Fax: [phone]    Patient's Name: Rodney Adkins Date of Birth: October 12, 1955 MRN: [id number]  *********Rule out Contraindications**************** Absolute                                                                                                                           ___ Acute MI (3-5 Days)                                 ___ Unstable Angina                                          ___ Uncontrolled arrhythmias causing symptoms or hemodynamic compromise. ___ Syncope                                                     ___ Active endocarditis                                         ___ Acute Myocarditis/Pericarditis                        ___ Symptomatic severe aortic stenosis  ___ Acute Pulmonary embolus or pulmonary infarction                ___ Uncontrolled Heart Failure  ___ Thrombosis of lower extremitie ___ Suspected dissecting aneurysm ___ Uncontrolled Asthma                          ___ Pulmonary Edema                                        ___ RA desat @ rest<85%                                      ___ Repiratory Failure                                         ___ Acute noncardiopulmonary disorder that may affect exercise performance or be         aggravated by exercise (ie infection, renal failure,  thyrotoxicosis) .                               ___ Mental impairment leading to inabliity to cooperate   Relative ___ Left main coronary stenosis or its equivalent ___ Moderate stentoic valvular heart disease ___ Severe untreated arterial hypertension @ rest (<200 mmHg             systolic,>175mmHg Diastolic ___ Tachy/Brady Arrhythmias ___ High- degree artioventricular block __x_ Hypertrophic cardiomyopathy ___ Significant pulmonary hypertension ___ Advanced or complicated pregnancy ___ Electrolyte abnormalities ___ Orthopedic impairment  that compromises exercise performance  He has HOCM (partly) but has had 2 CPST in [facility] whic he tolerated fine. Face to face discussion with Dr. Graciela Husbands and Dr. Teressa Lower both have approved CPST testing in this patient.   Kalman Shan MD    [facility]

## 2010-10-06 ENCOUNTER — Ambulatory Visit (HOSPITAL_COMMUNITY): Payer: BC Managed Care – PPO

## 2010-10-21 ENCOUNTER — Ambulatory Visit (HOSPITAL_COMMUNITY): Payer: BC Managed Care – PPO | Attending: Internal Medicine

## 2010-10-21 DIAGNOSIS — R0602 Shortness of breath: Secondary | ICD-10-CM | POA: Insufficient documentation

## 2010-10-28 ENCOUNTER — Telehealth: Payer: Self-pay | Admitting: Internal Medicine

## 2010-10-28 DIAGNOSIS — R0602 Shortness of breath: Secondary | ICD-10-CM

## 2010-10-28 NOTE — Telephone Encounter (Signed)
Candise Bowens, Can you call Mr. Leasure. Please let him know that CPST shows no major lung issues but heart rate dropped at peak exercise. I am talking to cardiologist what this means and will get back to him. Meanwhile, Iwould recommend he do methacholine challenge test to rule out asthma due to hx of childhood asthma and current cough. The CPST test is not diagnostic for asthma. If he has more questions, he could email me (he has my email)

## 2010-11-03 ENCOUNTER — Encounter: Payer: BC Managed Care – PPO | Admitting: *Deleted

## 2010-11-04 NOTE — Telephone Encounter (Signed)
Spoke with pt and he will call back if he wants to set up MCT.Carron Curie, CMA

## 2010-11-10 ENCOUNTER — Other Ambulatory Visit: Payer: Self-pay

## 2010-11-10 ENCOUNTER — Ambulatory Visit (INDEPENDENT_AMBULATORY_CARE_PROVIDER_SITE_OTHER): Payer: BC Managed Care – PPO | Admitting: *Deleted

## 2010-11-10 DIAGNOSIS — I442 Atrioventricular block, complete: Secondary | ICD-10-CM

## 2010-11-10 NOTE — Telephone Encounter (Signed)
Addended by: Scherrie Bateman on: 11/10/2010 01:12 PM   Modules accepted: Orders

## 2010-11-10 NOTE — Progress Notes (Signed)
Pacer check in clinic  

## 2010-11-14 ENCOUNTER — Encounter: Payer: Self-pay | Admitting: Internal Medicine

## 2010-11-18 NOTE — Letter (Signed)
April 19, 2008    Nolon Rod, MD  [facility] [id number]  [place], [place]  [address]   RE:  ASTIN, SAYRE  MRN:  [id number]  /  DOB:  1956-04-09   Dear [name],   I hope this letter finds you well.  Please find the initial echo on Dr.  Lenis Noon.  I will look forward to hearing from you after you have just  looked at them with your team, as we seek to better understand the  catastrophic physiology with which he presented.   I should note that yesterday he came in with the intention reprogramming  his AV delay to see we could maximize his TVI and his cardiac output.  It turned out that he was in sinus rhythm with left bundle-branch block  and left axis deviation and that on interrogation of his pacemaker.  There has been 3% intrinsically conducted beats that was initially a  relatively short AV interval and that AV interval has been length and  down to 250 ms.  We will see how he does.   I am looking forward to hearing from you.    Sincerely,      [name], MD, [facility]  Electronically Signed    SCK/MedQ  DD: 04/19/2008  DT: 04/19/2008  Job #: [id number]

## 2010-11-18 NOTE — Procedures (Signed)
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  This study is performed to evaluate myocardial perfusion  and viability.   The patient was injected with 3.1 mCi of thallium at rest.  The images  were obtained 3 hours later.  Four hours following this, the patient was  reinjected with 1 mCi of thallium and again images were obtained 30  minutes later.   The images were reconstructed in the short axis as well as the vertical  and horizontal long axis.  Both at rest and reinjection images show a  small defect in the inferoseptal wall, but otherwise the perfusion is  normal.  There is no change in the defect from the initial images to the  reinjection images.  This appears to be a most likely inferoseptal  thinning, but otherwise normal perfusion.   FINAL INTERPRETATION:  Rest reinjection thallium revealing a small  inferoseptal defect, most likely due to inferior thinning.  The  remaining perfusion is normal.  This does not appear to suggest an  infiltrative process in the septum.     Madolyn Frieze Jens Som, MD, [facility]  Electronically Signed    BSC/MedQ  DD: 01/13/2008  DT: 01/14/2008  Job #: [id number]   cc:   [name], MD, [facility]

## 2010-11-18 NOTE — Letter (Signed)
January 05, 2008    Nolon Rod, M.D.  [address] [place], [place]  [address]   RE:  [name], [name]  MRN:  [id number]  /  DOB:  November 02, 1955   Dear [name],   This letter is to remind you about our conversation about Rodney Adkins, who will be calling you this coming week for an appointment and  consultation regarding his cardiomyopathy.   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  He  turned out to have an old known left  bundle-branch block and echocardiography done on the day of his  presentation, demonstrated an hypertrophic obstructive cardiomyopathy  with systolic anterior motion, significant MR.  He underwent emergent  pacing that night for both refractory hypotension and developing  pulmonary edema, which was associated with rapid improvement.   He continues to improve, although slowly.  He is still quite fatigued.   PAST MEDICAL HISTORY:  Notable for multiple sclerosis.   Of note is an echo that he had done in February 2004, which refers to  normal ventricular wall thicknesses, although they are not quantitated  in the report, normal-appearing mitral leaflets, and normal mitral  inflow.  No evidence of problems across the aortic valve by Doppler.  The question at this stage is the observed hypertrophic cardiomyopathy  and it is occurring in the setting of complete heart block and  antecedent left bundle-branch block, in fact, a hokum phenocopy or not.  To that end, we will be planing to do another echo here as well as a  thallium perfusion scan trying to identify the presence of an  intramyocardial process that might give rise to a phenocopy.   As relates to exercise capacity, at some point, stress echo may be, I  think, useful to elucidate what is happening with exercise, if he is not  able to get himself back to where he was with his walking and  yoga.   We also had a lengthy discussion about the impact of lifestyle on his  long-term health and is related to his hokum.   He will be working on modifying those things.   Thanks for your help in anticipation.     Sincerely,      [name], MD, [facility]  Electronically Signed    SCK/MedQ  DD: 01/06/2008  DT: 01/06/2008  Job #: [id number]   CC:    Madaline Guthrie, M.D.

## 2010-11-18 NOTE — Assessment & Plan Note (Signed)
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  We spent an hour or so working on  symptoms of shortness of breath with exertion.  This has been somewhat  episodic.  He has noted that when he walks his heart rate would get to  230, slow downs and abruptly fall to 75, and this was associated  significant symptoms.  This clearly represented upper rate limit  behavior.  We also interrogated the device and he recalls having been on  the [employer] a couple of months.  A number of weeks ago, he is able  to generate a heart rate of 150-250.  The heart rate has been in 140-  150, 50% of beats were intrinsically conducted.  We had seen some  evidence of intrinsically conducted beats some weeks before.   He also describes pounding with exertion and my hypothesis is that this  represents upper rate Wenckebach behavior with relative VA dissociation.   We did a number of walk tests and clarified in fact that with walking  and climbing stairs his heart rate exceeds upper rate limit of 130, does  not exceed upper rate limit of 150.  The trade-off here is obvious in  that higher rates are associated with short and diastole, it may  empiric/tolerance particularly in this cord of the patient with  diastolic dysfunction.  The alternative is to use beta-blockers with  their attendant side effects to reduce heart rate.   What we have decided to do initially is to try the former, increase his  heart rate to 150 and see how he does, see if any improved exercise  tolerance, and eliminates the pounding.  In the event that it does look  some symptoms persist and heart rate control with a beta-blocker would  be the right thing in the past.  We have been reluctant to do that  because  of blood pressures in the high 90s.  Today, it is 115 and so we  may well be able to get away with it.   He is to call us next week unless otherwise doing well.  We will see him  again in 6-8 weeks' time.     [name], MD, [facility]  Electronically Signed    SCK/MedQ  DD: 06/12/2008  DT: 06/13/2008  Job #: [id number]

## 2010-11-18 NOTE — H&P (Signed)
Rodney Adkins, Rodney Adkins              ACCOUNT NO.:  [id number]   MEDICAL RECORD NO.:  [id number]          PATIENT TYPE:  INP   LOCATION:  2306                         FACILITY:  [facility]   PHYSICIAN:  Rodney Sans. Wall, MD, FACCDATE OF BIRTH:  January 13, 1956   DATE OF ADMISSION:  12/12/2007  DATE OF DISCHARGE:                              HISTORY & PHYSICAL   PRIMARY CARE PHYSICIAN:  Rodney Guthrie, MD.   PRIMARY CARDIOLOGIST:  Rodney Sans. Wall, MD.   CHIEF COMPLAINT:  Weakness.   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  He has no history of coronary artery disease.  He woke up yesterday in the morning with shortness of breath and  dizziness.  His symptoms were worse with exertion.  Occasionally, he  also felt his heart pounding but does not feel that the rate was very  high.  His wife took his pulse and felt that his heart rate was low.  When his symptoms did not improve today, he went to an urgent care and  Rodney Adkins realized that he was in complete heart block and sent him  via EMS to the emergency room.   Rodney Adkins has never had symptoms such as these before.  He states that  he can walk from room to room and around in the house without  presyncope, but he does get short of breath with exertion and does not  feel that he would be able to make it up a whole flight of stairs  without stopping.  This is unusual for him and that he exercises  regularly.  Currently at rest, he feels a little weak but is otherwise  asymptomatic.   PAST MEDICAL HISTORY:  1. He has no history of diabetes, hypertension, hyperlipidemia,      tobacco use, or family history of premature coronary artery disease      or any history of sudden death or family history of arrhythmia.  2. Status post ER visit in January 2008, for chest pain, discharged      from the emergency room and Cardiology followup recommended, but      did not occur because his symptoms  resolved.  3. Multiple sclerosis.  4. History of systolic ejection murmur and a bundle branch block,      status post Cardiology evaluation in [place], [place],      several years ago.   SURGICAL HISTORY:  He is status post tonsillectomy and throat surgery.   ALLERGIES:  No known drug allergies.   CURRENT MEDICATIONS:  Occasional vitamins and occasional aspirin.   SOCIAL HISTORY:  Lives in [place] with his wife and works as a  Immunologist at [employer].  He has no history of alcohol, tobacco, or  drug abuse.  He is a vegetarian and exercises regularly.   FAMILY HISTORY:  His parents are both in their 54s and neither one has a  history of coronary artery disease.  There is no family history of  sudden death.  He has 1 brother who does not have heart disease.  There  is no family member that has required a pacemaker or other device of  which he is aware.   REVIEW OF SYSTEMS:  He complains of weakness.  He has some dyspnea on  exertion.  He has not had chest pain or presyncope, just slight  dizziness.  He denies arthralgias, hematemesis, hemoptysis, or melena.  Rodney Adkins 14-point review of systems is otherwise, negative.   PHYSICAL EXAMINATION:  VITAL SIGNS:  Temperature 96.7, blood pressure  107/63, pulse 47, and respiratory rate 18.  O2 saturation 100% on 2 L.  GENERAL:  He is a well-developed, well-nourished, white male in no acute  distress.  HEENT:  Normal.  NECK:  There is no lymphadenopathy, thyromegaly, or JVD noted.  HEART:  He has a heart murmur that radiates to both carotids.  CV:  His heart has regular rate and rhythm with an S1, S2, S3, and a 3/6  systolic ejection murmur is noted at the apex.  His distal pulses are  intact in all 4 extremities and no bruits are appreciated.  LUNGS:  Clear to auscultation bilaterally.  SKIN:  No rashes or lesions are noted.  ABDOMEN:  Soft and nontender with active bowel sounds.  EXTREMITIES:  There is no cyanosis, clubbing,  or edema noted.  MUSCULOSKELETAL:  There is no joint deformity or effusion and no spinal  or CVA tenderness was noted.  NEURO:  He is alert and oriented with cranial nerves II through XII  grossly intact.   Chest x-ray is pending.   EKG, complete heart block with a right bundle-branch block.  Rate 47.   LABORATORY DATA:  Laboratory values are pending but CBC is within normal  limits.   IMPRESSION:  Weakness/complete heart block:  He will be admitted to step  down.  We will get cardiac enzymes.  An echo has been ordered.  TSH and  other labs are pending.  Electrophysiology has been contacted regarding  a pacemaker.  His murmur is consistent more with HCM and/or MITRAL VALVE  ABNORMALITY.  He also needs a cardiac catheterization to make sure there  is no ischemic cause of his symptoms.  Further evaluation and treatment  will depend on the results of the above testing.      Rodney Demark, PA-C      Rodney Sans. Daleen Squibb, MD, [facility]  Electronically Signed    Rodney Adkins  D:  12/12/2007  T:  12/13/2007  Job:  [id number]   cc:   Rodney Adkins, M.D.  Rodney Adkins, M.D.

## 2010-11-18 NOTE — Letter (Signed)
April 25, 2008    Nolon Rod, MD  [facility]  [facility] [address]  [place], [place] [address]   RE:  Rodney Adkins, Rodney Adkins  MRN:  [id number]  /  DOB:  11/15/1955   Dear [name],   I am glad we had a chance to talk yesterday.  I am sorry it was so  brief, and I look forward to getting your letter.   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  Last week, he had come in and we had used a  TVI of the aortic valve to try to maximize cardiac output with various  AV delay programmings.  What we found is that if we took out the AV  delay long enough, he would conduct intrinsically, and indeed today in  sinus rhythm is conducting with a PR interval of 140 milliseconds.  However, rather paradoxically, he was feeling considerably worse over  the last week, with 86% intrinsic conduction.   I look forward to hearing what you say about the echoes, but the above  observation now begs the issue as to whether notwithstanding his  presumably still relatively normal left ventricular function and  presumably also the absence of tricuspid regurgitation, which I did not  hear about in the echoes, whether an LV lead might help here.   What I have done today is I have reprogrammed him to 220/250 to allow  him to intrinsically conduct at almost all heart rates.  I have  decreased his lower rate from 60 to 50 to minimize atrial pacing.  He is  to let us know how he feels.  If he does not feel better, we will plan  to have him come back, and we will reprogram him at 180/150 as he was  before.   I look forward to talking with you about the issue of resynchronization  in this gentleman.   I should note that on examination today his blood pressure was 92/72,  his pulse was 64, and there was a 2/6 murmur heard along the left lower  sternal border, which I could not modulate by reprogramming of his AV  delays.  This is not a big surprise as he has got left bundle-branch  block with or without pacing, and the QRS morphology of  his left bundle-  branch block is fairly similar except that the axis is a little bit more  leftward with pacing.   I look forward to hearing from you.  Thank you for your help with him.    Sincerely,      [name], MD, [facility]  Electronically Signed    SCK/MedQ  DD: 04/25/2008  DT: 04/25/2008  Job #: [id number]

## 2010-11-18 NOTE — Discharge Summary (Signed)
NAMEKAWON, WILLCUTT              ACCOUNT NO.:  [id number]   MEDICAL RECORD NO.:  [id number]          PATIENT TYPE:  INP   LOCATION:  3315                         FACILITY:  [facility]   PHYSICIAN:  [name], MD, FACCDATE OF BIRTH:  26-Dec-1955   DATE OF ADMISSION:  12/12/2007  DATE OF DISCHARGE:  12/16/2007                               DISCHARGE SUMMARY   PRIMARY CAREGIVERS:  Madaline Guthrie, M.D.   ALLERGIES:  This patient has no known drug allergies.   TIME FOR DICTATION EXAM AND DISCUSSION:  45 minutes.   FINAL DIAGNOSES:  1. Admitted with dyspnea/dizziness/palpitation.  2. Finding of complete heart block at this admission.  3. Right bundle-branch block.  (Previously, the patient had indication      for left bundle-branch block on previous electrocardiogram).  4. Murmur.  There is systolic anterior motion in the anterior and      posterior mitral valve leaflets.  5. HOCM:  Moderate focal basal septal hypertrophy by echocardiogram,      December 12, 2007.  6. Ejection fraction 55% to 65% by echocardiogram on December 12, 2007; no      left-ventricular wall-motion abnormalities; severe systolic      anterior motion of the anterior and posterior mitral valve leaflets      (SAM).  7. Flash pulmonary edema, day of admission.      a.     The patient was hypotensive, required extensive fluid       resuscitation.      b.     Loss of AV synchrony, with complete heart block.      c.     Decreased cardiac output with complete heart block.      d.     Decreased left ventricular filling exacerbates SAM/LVOT       gradient.  The patient has an estimated 36-mm gradient.  8. Urgent pacemaker implanted at midnight December 12, 2007.  The patient      is now discharging day 4 after pacemaker implantation.      a.     Intubation prior to the pacemaker.  The patient had       ventilator-dependent respiratory failure and was intubated for a       48-hour period.  9. Pleuritic chest pain, worse with cough,  worse with inspiration.      a.     Chest x-ray, December 16, 2007, lungs fully inflated; leads       appropriate position; no pneumothorax; minor right pleural       effusion at the costophrenic angle.      b.     Echocardiogram, December 16, 2007, trivial pericardial effusion.      c.     Home on NSAIDs with close followup with Dr. Graciela Husbands.   SECONDARY DIAGNOSES:  1. Persistent dry cough.  2. History of multiple sclerosis, in remission.   PLAN:  1. NSAIDs for pleurisy, which was a diagnosis of exclusion.  2. The patient is to call [phone] to call Dr. Graciela Husbands, Saturday, to      discuss how he is feeling  as an outpatient.  He is to discuss any      change in his breathing, increase cough, or increased chest pain.  3. Pacer clinic, Thursday, December 29, 2007 at 9:40 for an incision      check.  4. He will see Dr. Graciela Husbands on January 05, 2008, at 4:30, at that time to      formulate plans for possible repeat echocardiogram/possible Myoview      study; also to make a recommendation for pulmonary followup for the      persistent dry cough which has been there for several years.  Also,      if he has not already been contacted by Dr. Donalee Citrin he is to be      followed up by Dr. Modesto Charon.  5. He will see Dr. Graciela Husbands Tuesday, March 20, 2008, at 9:20 for re-      interrogation of his device.   PROCEDURE:  Midnight, December 12, 2007, implantation of a Medtronic Adapta  dual-chamber pacemaker urgently by Dr. Sherryl Manges; the patient was in  flash pulmonary edema.   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  He woke up on the morning of December 11, 2007,  short of breath and with dizziness.  He also had palpitations.  On December 12, 2007, his symptoms had gotten no better.  He was unable to walk far  without stopping.  He had increased dyspnea with exertion.  He was  admitted to the emergency room and found to be in complete heart block.  Serial cardiac enzymes  were negative for acute myocardial infarction;  they were 0.01, then 0.05, then 0.05.  Echocardiogram was also obtained.  A murmur was auscultated, and it was felt the patient probably had HOCM.   HOSPITAL COURSE:  The patient was admitted through the emergency room on  December 12, 2007.  He was in complete heart block.  He had right bundle-  branch block.  He was hypotensive.  He required fluid resuscitation.  He  was seen by Dr. Sherryl Manges on the day of admission, who recommended  implantation of pacemaker with possible temporary pacemaker if blood  pressure continued to be decreased.  At 11 o'clock in the evening of the  day of admission, December 12, 2007, the patient had worsening dyspnea.  Anesthesia was called, and Dr. Graciela Husbands was also called by Dr. Jens Som,  who had been monitoring the patient for 1 hour prior to this.  The  patient was intubated and the pacemaker was urgently implanted.  The  reasons for the flash pulmonary edema have been given above.  They  include decreased cardiac output, hypotension, decreased left  ventricular filling with worsening of the SAM, worsening of the left  ventricular outflow tract gradient, and loss of atrioventricular  synchrony.  All of these led to pulmonary edema and required pacemaker  implantation urgently.  The patient was intubated for about 48 hours,  was successfully weaned from the ventilator on December 14, 2007.  He  required 48 hours to fully recover.  He still has a persistent grade 2  over grade 6 holosystolic murmur.  He has had serial echocardiograms  here, most recently because he was complaining of pleuritic-type chest  pain on the day of discharge, December 16, 2007.  He had a chest x-ray which  showed no pneumothorax and very minor pleural effusions, right greater  than left.  The pacer wires were in appropriate position.  He also had a  2D echocardiogram on December 16, 2007; the study showed trivial pericardial  effusion.  The patient will  go home with the following medications:  1. Advil 200 mg 1-2 tablets every 4-6 hours as needed for pain; this      would be NSAID therapy.  2. Enteric-coated aspirin 81 mg daily.  3. Multivitamin daily.   He follows up as in the plan above.   LABORATORY STUDIES ON THIS ADMISSION:  His hemoglobin is 14.2,  hematocrit 40.6, white cells 14.3, platelets of 130.  Serum  electrolytes:  Sodium 142, potassium 3.9, chloride 109, bicarbonate 25,  BUN is 22,  creatinine 1.17, glucose 103.  His pro time this admission is 14.2, INR  1.1, alkaline phosphatase this admission is 67, SGOT 28, and SGPT is 16.  As mentioned above, troponin I studies are 0.01, then 0.05, then 0.05.  TSH this admission was 1.890.  The ESR was 1.  The BNP on admission was  986.      Maple Mirza, [place]      [name], MD, [facility]  Electronically Signed    GM/MEDQ  D:  12/16/2007  T:  12/17/2007  Job:  [id number]   cc:   Madaline Guthrie, M.D.  [name], MD, [facility]  Jesse Sans. Wall, MD, [facility]

## 2010-11-18 NOTE — Op Note (Signed)
NAMEORLEY, LAWRY              ACCOUNT NO.:  [id number]   MEDICAL RECORD NO.:  [id number]          PATIENT TYPE:  INP   LOCATION:  2306                         FACILITY:  [facility]   PHYSICIAN:  [name], MD, FACCDATE OF BIRTH:  01/23/1956   DATE OF PROCEDURE:  12/13/2007  DATE OF DISCHARGE:                               OPERATIVE REPORT   PREOPERATIVE DIAGNOSES:  1. Hypertrophic cardiomyopathy.  2. Complete heart block.  3. Congestive heart failure   POSTOPERATIVE DIAGNOSES:  1. Hypertrophic cardiomyopathy.  2. Complete heart block.  3. Congestive heart failure.   PROCEDURE:  Dual-chamber pacemaker implantation.   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  He was hypotensive.  Volume resuscitation resulted in acute pulmonary  edema requiring intubation.  He was taken emergently to the Cath Lab for  dual-chamber pacemaker implantation and restore dyssynchrony.   After routine prep and drape of the left upper chest, lidocaine was  infiltrated in the prepectoral subclavicular region.  The incision was  made and carried down to the level of prepectoral fascia with  electrocautery and sharp dissection.  A pocket was formed similarly.  Hemostasis was obtained.   Thereafter, attention was turned to gain access to the extrathoracic  left subclavian vein, which was accomplished without difficulty, without  the aspiration of air or puncture of the artery.  Two separate  venipunctures were accomplished.   Sequentially, 7-French sheaths were placed through which were passed  Medtronic 5076 58 cm active fixation ventricular lead serial number  [id number] and Medtronic 5076 52 cm active fixation atrial lead serial  number [id number].  Under fluoroscopic guidance, these were manipulated  in the right ventricular apex and right atrial appendage respectively  where the bipolar R-wave was 12.2 with the pace impedance of 1126 ohms  and threshold of 0.7 V at  0.5 milliseconds.  Current threshold was 0.6  mA.  No diaphragmatic pacing at 10 volts and the current of injury was  prominent.   Dyssynchrony was marked with a tie.   The bipolar P-wave was 8 with the pace impedance of 844 ohms and  threshold of 0.8 V at 0.5 milliseconds.  Current threshold was 1.2 mA.  There was no diaphragmatic pacing at 10 volts and the occurrence of  injury was prominent.  These leads were secured to the prepectoral  fascia, then attached to Medtronic adapter L pulse generator serial  number [id number].  Ventricular pacing and presynchronous pacing were  identified.  The pocket was copiously irrigated with antibiotic  containing saline solution.  Hemostasis was assured.  The leads and the  pulse generator were placed in the pocket, secured to the prepectoral  fascia and the wound was then closed in three layers in normal  fashion.  The wound was wash dried, and a benzoin Steri-Strip dressing  was applied.  Needle count, sponge counts, and instrument counts were  correct at the end of the procedure according to staff.  The patient  tolerated the procedure without apparent complication.      [name], MD, [facility]  Electronically Signed  SCK/MEDQ  D:  12/13/2007  T:  12/13/2007  Job:  [id number]

## 2010-11-18 NOTE — Assessment & Plan Note (Signed)
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  These are nonexertional unrelieved by rest, fairly constant and  can last hours.  His wife remains concerned that he may have underlying  structural heart disease.   There has been a great deal of stress of late.   He has no known coronary artery disease.   He continues to complain of exercise intolerance particularly while  doing yoga.   A stress echo that we did unfortunately does not have a lot of  information regarding changes in outflow obstruction with various AV  delays.   Medications include aspirin and fish oil.   On examination, his weight was 147, which is stable.  His blood pressure  is 104/68, his pulse was 60.  His lungs were clear.  Heart sounds were  regular.  There was a 2/6 murmur heard best as we went out to the apex.  The extremities were without edema.   Electrocardiogram demonstrated AV pacing.  Interrogation of his  pacemaker demonstrates his atrial paced about 35% of time with decent  heart rate excursion.   IMPRESSION:  1. Hypertrophic cardiomyopathy.  2. Complete heart block.  3. Ongoing problems with exercise intolerance.  4. Chest pain probably not cardiac.   We will plan to undertake troponin just to make sure that I am not long  here.  I suspect this is GE reflux disease and had probably getting a  Pepcid AC or omeprazole over-the-counter taken for 4 weeks.  As he has  persistent symptoms and his troponin is negative, he should follow up  with his primary care physician.   The other issue that I told him I would raise with Dr. Modesto Charon is whether  reprogramming his AV delay will tightly might help prevent outflow  obstruction  and faster heart rates and whether this  might improve exercise tolerance.  It may also be that he is just going  to have limited exercise tolerance given his current physiology.   We will see him again as previously scheduled.     [name], MD, [facility]  Electronically Signed    SCK/MedQ  DD: 08/31/2008  DT: 09/01/2008  Job #: [id number]

## 2010-11-18 NOTE — Letter (Signed)
March 20, 2008    Nolon Rod, MD  [facility] [address]  [place], [place] [address]   RE:  [name], [name]  MRN:  [id number]  /  DOB:  [birth year]   Dear [name],   I saw Rodney Adkins today with his wife.  I guess you saw them  yesterday.  The question remains on the table what is the underlying  process, and I think as I review our notes it may be helpful if I can  figure out a way to do it to get the initial data from the ultrasound  where Dr. Olga Millers noted the same with significant MR and focal  hypertrophy of the proximal septum.  That may help clarify what is going  on.  He has some shortness of breath, but he thinks maybe it is related  to inadequate exercise.  We will see how it is that it changes over the  next couple of months.  We talked about changing the AV delays, but they  have elected not to do that at present.   PHYSICAL EXAMINATION:  VITAL SIGNS:  His blood pressure is 107/69, and  his pulse is 71.  LUNGS:  Clear.  HEART:  Sounds regular.  EXTREMITIES:  Without edema.   I will try to get that data too just as quickly as I can.    Sincerely,      [name], MD, [facility]  Electronically Signed   SCK/MedQ  DD: 03/20/2008  DT: 03/21/2008  Job #: [id number]

## 2010-11-19 ENCOUNTER — Telehealth: Payer: Self-pay | Admitting: Internal Medicine

## 2010-11-19 ENCOUNTER — Ambulatory Visit (INDEPENDENT_AMBULATORY_CARE_PROVIDER_SITE_OTHER): Payer: BC Managed Care – PPO | Admitting: Internal Medicine

## 2010-11-19 DIAGNOSIS — R000 Tachycardia, unspecified: Secondary | ICD-10-CM

## 2010-11-19 NOTE — Telephone Encounter (Signed)
Rodney Adkins, He emailed stating he wanted CPST result copy. I already signed it but dont see it scanned into EPIC. Please print a copy for him and call him and have him pick it up at front desk. If it is not in epic, please get hold of Laymond Purser and have him fax you a copy that you can give the patient

## 2010-11-19 NOTE — Progress Notes (Signed)
Exercise Treadmill Test  Pre-Exercise Testing Evaluation Rhythm: Pacemaker  Rate: 65   PR:  .14 QRS:  .17  QT:  .46 QTc: .48     Test  Exercise Tolerance Test Ordering MD: Sherryl Manges, MD  Interpreting MD:  Sherryl Manges, MD  Unique Test No: 1  Treadmill:  1  Indication for ETT: Tachycardia  Contraindication to ETT: No   Stress Modality: exercise - treadmill  Cardiac Imaging Performed: non   Protocol: standard Bruce - submaximal  Max BP:198/74  Max MPHR (bpm):  164 139  MPHR obtained (bpm): 169 % MPHR obtained:  103%  Reached 85% MPHR (min:sec): 5:00 Total Exercise Time (min-sec): 11:51  Workload in METS:  13.1 Borg Scale15  Reason ETT Terminated:  desired heart rate attained    ST Segment Analysis At Rest: normal ST segments - no evidence of significant ST depression With Exercise: no evidence of significant ST depression  Other Information Arrhythmia:  No Angina during ETT:  absent (0) Quality of ETT:  diagnostic  ETT Interpretation:  normal - no evidence of ischemia by ST analysis  Comments:exercie associtated loss of atrial tracking with apusess reprogramming of devoce tp shprtem PVARP elimianated pauses   Recommendations: doene

## 2010-11-20 ENCOUNTER — Encounter: Payer: BC Managed Care – PPO | Admitting: *Deleted

## 2010-11-20 NOTE — Telephone Encounter (Signed)
Pt aware results available and he request I mail him a copy. Copy mailed. Carron Curie, CMA

## 2010-11-21 NOTE — Assessment & Plan Note (Signed)
[facility]                                 ON-CALL NOTE   NAME:Rodney Adkins, Rodney Adkins                       MRN:          [id number]  DATE:07/18/2006                            DOB:          [birth year]    This phone message is in response to a call that I took from the  emergency department at [facility].  The patient's name is Rodney Adkins.  He is new to our practice and apparently lives in [place] now.  By  report from the emergency room physician assistant, whose name I did not  get, the patient came to the emergency department today with 3 days of  chest pain, was evaluated, had negative serial cardiac enzymes, D-  dimers, and x-ray.  The report was that his ECG demonstrated an old left  bundle branch block.  Because the patient had all of the above findings,  it was felt by the emergency department staff that he was stable for  discharge, and we were called to arrange followup.  Specifically, we  were not asked to admit the patient.  I have instructed the physician's  assistant in the emergency department that we would be happy to arrange  followup as referred, which will be scheduled in the next several days  and gave our office number for the patient to call to be seen for  evaluation.     Doylene Canning. Ladona Ridgel, MD  Electronically Signed    GWT/MedQ  DD: 07/18/2006  DT: 07/18/2006  Job #: [id number]

## 2011-01-20 ENCOUNTER — Telehealth: Payer: Self-pay | Admitting: Gastroenterology

## 2011-01-20 ENCOUNTER — Telehealth: Payer: Self-pay | Admitting: Internal Medicine

## 2011-01-20 NOTE — Telephone Encounter (Signed)
That would be fine 

## 2011-01-20 NOTE — Telephone Encounter (Signed)
Pt is calling wanting to be seen, Dr. Arlyce Dice you are the doc of the day. Pt c/o nausea for 7-10days, has a very full/bloated feeling. He has noticed a narrowing of his stools and this morning he had very little at all. Pt would like to be seen. Please advise.

## 2011-01-20 NOTE — Telephone Encounter (Signed)
Will review with Dr. Graciela Husbands to see if he wants to make this referral or have the PCP do this.

## 2011-01-20 NOTE — Telephone Encounter (Signed)
Pt scheduled to see 1st available 01/27/11@10 :15am with Dr. Leone Payor. Wife states that have an appt with [facility] this Friday but she will discuss with her husband and may cancel. State she will call back. Pt contacted his PCP today.

## 2011-01-20 NOTE — Telephone Encounter (Signed)
Per pt wife calling, pt is having symptoms of colon blockage. Pt seeing [facility] for actue x-rays today to rule out acute blockage. Pt wife would like referral to [facility] gastroenterology for colonoscopy. Pt currently has appt for Friday at [facility] gastroenterology. However pt and pt wife would prefer to stay in the [facility] family for the colonoscopy provided Dr. Graciela Husbands can refer them. Pt wife also has questions about pt pace makers and the colonoscopy any complications associated.

## 2011-01-20 NOTE — Telephone Encounter (Signed)
He does not have to be seen today.  Can contact PCP or make app't for later in week

## 2011-01-21 NOTE — Telephone Encounter (Signed)
I spoke with the patient's wife and she states she had called [facility] yesterday before calling us. She only called [place] because she didn't know if they would need a referral. GI had already contacted her back with an appointment with Dr. Leone Payor for 01/27/11. She states her husband's x-rays came back negative for an acute blockage to his colon, but he is having decreased stools and bloating with abdominal pain. He is not able to eat as much. She wanted to know if we could call GI to see if they could get him in before next Tuesday. I called and spoke with the scheduling dept for GI. They did have a cancellation for Dr. Leone Payor tomorrow at 9:00am and they have moved the patient to that slot. I have spoken with the patient's wife and she is aware of the date and time.

## 2011-01-22 ENCOUNTER — Ambulatory Visit (INDEPENDENT_AMBULATORY_CARE_PROVIDER_SITE_OTHER): Payer: BC Managed Care – PPO | Admitting: Internal Medicine

## 2011-01-22 ENCOUNTER — Encounter: Payer: Self-pay | Admitting: Internal Medicine

## 2011-01-22 VITALS — BP 110/64 | HR 80 | Ht 70.0 in | Wt 144.0 lb

## 2011-01-22 DIAGNOSIS — R198 Other specified symptoms and signs involving the digestive system and abdomen: Secondary | ICD-10-CM

## 2011-01-22 DIAGNOSIS — R634 Abnormal weight loss: Secondary | ICD-10-CM

## 2011-01-22 MED ORDER — PEG-KCL-NACL-NASULF-NA ASC-C 100 G PO SOLR: 1.0000 | Freq: Once | ORAL | Status: DC

## 2011-01-22 NOTE — Assessment & Plan Note (Signed)
Narrow-caliber stools and smaller stools overall for 2-3 weeks. Cause not clear. No hx of colonoscopy. Needs one to evaluate for cause of this. Risks and benefits discussed. Mild weight loss associated with this problem also.

## 2011-01-22 NOTE — Assessment & Plan Note (Signed)
Mild recent weight loss, temporally associated with change in bowels

## 2011-01-22 NOTE — Patient Instructions (Signed)
You have been scheduled for a Colonoscopy with separate instructions given. Your prep kit has been sent to your pharmacy for you to pick up. You can try using a Fleets enema and Milk of Magnesia 1-2 tbsp several times a day to help with constipation for a couple of days as needed.

## 2011-01-22 NOTE — Progress Notes (Signed)
  Subjective:    Patient ID: Rodney Adkins, male    DOB: 1955-10-13, 55 y.o.   MRN: [id number]  HPI 55 yo wm has noticed recent change in bowels with reduced frequency of defecation. Bloated and nauseate. Gas on my stomach and a lot of belching. Stools have narrowed quite a bit. Last 2 nights tried a suppository to help defecation and metamucil was tried. Not much help. He saw Dr. Cliffton Asters of [place] [facility] 2 days ago and labs, xrays performed with no particular abnormalities, including a rectal exam. There was a large amount of fecal matter in the bowels on KUB per patent and wife. No prior history of problems like this. Not much if any pain. No vomiting but has had at least one spell of nausea. Only change was started exercising more. Healthy diet, vegetarian with dairy/eggs.  Review of Systems All other ROS negative or as per HPI.    Objective:   Physical Exam  Constitutional: He appears well-developed and well-nourished.  HENT:  Head: Normocephalic and atraumatic.  Mouth/Throat: No oropharyngeal exudate.  Eyes: Conjunctivae are normal. No scleral icterus.  Neck: Normal range of motion. Neck supple. No thyromegaly present.       No Gem adenopathy  Cardiovascular: Normal rate, regular rhythm and normal heart sounds.  Exam reveals no gallop and no friction rub.   No murmur heard. Pulmonary/Chest: Effort normal and breath sounds normal.  Abdominal: Soft. Bowel sounds are normal. He exhibits no distension and no mass. There is no tenderness. There is no guarding.       No HSM  Genitourinary: Rectum normal and prostate normal.       Normal tone, no stool in vault, no mass and normal anoderm  Musculoskeletal: He exhibits no edema.  Lymphadenopathy:    He has no cervical adenopathy.  Skin: Skin is warm and dry.  Psychiatric: He has a normal mood and affect. His behavior is normal.          Assessment & Plan:

## 2011-01-26 ENCOUNTER — Ambulatory Visit (AMBULATORY_SURGERY_CENTER): Payer: BC Managed Care – PPO | Admitting: Internal Medicine

## 2011-01-26 ENCOUNTER — Encounter: Payer: Self-pay | Admitting: Internal Medicine

## 2011-01-26 VITALS — BP 129/50 | HR 61 | Temp 98.4°F | Resp 16

## 2011-01-26 DIAGNOSIS — R198 Other specified symptoms and signs involving the digestive system and abdomen: Secondary | ICD-10-CM

## 2011-01-26 MED ORDER — SODIUM CHLORIDE 0.9 % IV SOLN: 500.0000 mL | INTRAVENOUS | Status: DC

## 2011-01-26 NOTE — Patient Instructions (Addendum)
Your colon and rectum were normal on exam today. I think your prostate gland is mildly enlarged but no nodules were felt. You should repeat a routine colonoscopy in 10 years and follow-up with your primary care physician about he prostate before the end of 2012, unless you were already aware of this and have been evaluated. You may continue with milk of magnesia or use MiraLax to promote better defecation and see me as needed. Iva Boop, MD, [facility]   Please review all discharge papers given to you by the recovery room nurse. Please call [phone] if you experience any problems after discharge. You will receive a call in the am from one of our nurses to see how you are doing and answer any questions you may have. Thank you

## 2011-01-27 ENCOUNTER — Ambulatory Visit: Payer: BC Managed Care – PPO | Admitting: Internal Medicine

## 2011-01-27 ENCOUNTER — Telehealth: Payer: Self-pay | Admitting: *Deleted

## 2011-01-27 NOTE — Telephone Encounter (Signed)

## 2011-04-02 LAB — BASIC METABOLIC PANEL
BUN: 15
CO2: 21
CO2: 25
Calcium: 8.6
Chloride: 109
Creatinine, Ser: 1.05
GFR calc Af Amer: >60
GFR calc Af Amer: >60
Glucose, Bld: 103 — ABNORMAL HIGH
Potassium: 3.9
Sodium: 142

## 2011-04-02 LAB — POCT I-STAT 3, ART BLOOD GAS (G3+)
Acid-base deficit: 7 — ABNORMAL HIGH
Acid-base deficit: 9 — ABNORMAL HIGH
Bicarbonate: 16.1 — ABNORMAL LOW
Bicarbonate: 20.1
Bicarbonate: 20.9
Bicarbonate: 22.4
O2 Saturation: 92
O2 Saturation: 96
O2 Saturation: 97
Operator id: 199821
Operator id: 199821
Patient temperature: 97.7
Patient temperature: 98.1
Patient temperature: 98.1
TCO2: 17
TCO2: 22
TCO2: 22
TCO2: 23
pCO2 arterial: 33.5 — ABNORMAL LOW
pCO2 arterial: 40.5
pH, Arterial: 7.404
pH, Arterial: 7.449
pO2, Arterial: 81
pO2, Arterial: 88
pO2, Arterial: 89

## 2011-04-02 LAB — COMPREHENSIVE METABOLIC PANEL
AST: 28
BUN: 19
CO2: 26
Calcium: 9.7
Chloride: 105
Creatinine, Ser: 0.97
GFR calc non Af Amer: >60
Glucose, Bld: 92
Total Bilirubin: 0.9

## 2011-04-02 LAB — CBC
HCT: 40.6
Hemoglobin: 14.2
Hemoglobin: 15.1
MCHC: 34.4
MCHC: 34.9
MCHC: 35.8
MCV: 89.9
MCV: 90.2
Platelets: 146 — ABNORMAL LOW
Platelets: 179
RBC: 4.51
RBC: 4.87
RBC: 5.39
RDW: 12.3
RDW: 12.5

## 2011-04-02 LAB — LIPID PANEL
Cholesterol: 151
HDL: 35 — ABNORMAL LOW
LDL Cholesterol: 100 — ABNORMAL HIGH
Total CHOL/HDL Ratio: 4.3
Triglycerides: 79

## 2011-04-02 LAB — B-NATRIURETIC PEPTIDE (CONVERTED LAB): Pro B Natriuretic peptide (BNP): 986 — ABNORMAL HIGH

## 2011-04-02 LAB — CARDIAC PANEL(CRET KIN+CKTOT+MB+TROPI)
CK, MB: 1.1
Relative Index: INVALID
Relative Index: INVALID
Total CK: 55
Total CK: 67
Total CK: 72

## 2011-04-02 LAB — SEDIMENTATION RATE: Sed Rate: 1

## 2011-04-02 LAB — DIFFERENTIAL
Basophils Relative: 0
Lymphocytes Relative: 23
Lymphs Abs: 2.3
Monocytes Relative: 9
Neutro Abs: 6.8
Neutrophils Relative %: 67

## 2011-04-02 LAB — CK TOTAL AND CKMB (NOT AT ARMC): Relative Index: INVALID

## 2011-04-02 LAB — APTT: aPTT: 32

## 2011-04-22 ENCOUNTER — Encounter: Payer: Self-pay | Admitting: Internal Medicine

## 2011-04-29 ENCOUNTER — Encounter: Payer: Self-pay | Admitting: Internal Medicine

## 2011-04-29 ENCOUNTER — Ambulatory Visit (INDEPENDENT_AMBULATORY_CARE_PROVIDER_SITE_OTHER): Payer: BC Managed Care – PPO | Admitting: Internal Medicine

## 2011-04-29 DIAGNOSIS — I442 Atrioventricular block, complete: Secondary | ICD-10-CM

## 2011-04-29 DIAGNOSIS — Z950 Presence of cardiac pacemaker: Secondary | ICD-10-CM | POA: Insufficient documentation

## 2011-04-29 DIAGNOSIS — I421 Obstructive hypertrophic cardiomyopathy: Secondary | ICD-10-CM

## 2011-04-29 LAB — PACEMAKER DEVICE OBSERVATION
AL AMPLITUDE: 5.6 mv
BAMS-0001: 210 {beats}/min
RV LEAD IMPEDENCE PM: 510 Ohm
RV LEAD THRESHOLD: 0.75 V

## 2011-04-29 NOTE — Assessment & Plan Note (Signed)
stable °

## 2011-04-29 NOTE — Assessment & Plan Note (Addendum)
Stable. We will need to review genetic testing; he came back. He has one brother. Neither he nor his brother has children. His brother will get echocardiographic evaluation

## 2011-04-29 NOTE — Assessment & Plan Note (Addendum)
The patient's device was interrogated.  The information was reviewed. No changes were made in the programming.    

## 2011-04-29 NOTE — Patient Instructions (Signed)

## 2011-04-29 NOTE — Progress Notes (Signed)
  HPI  Rodney Adkins is a 55 y.o. 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 After discussion with Dr. Regino Schultze at [facility] we undertook stress echo looking to see the effects on the mitral valve and left ventricular outflow tract. Nothing was noted by Dr. Myrtis Ser Also per does so slowly that he was having high rate Wenckebach behavior resulting in extreme exercise tolerance. He underwent treadmill testing earlier this year with subsequent reprogramming which has been associated with a marked improvement in exercise tolerance and quality of life. He is now exercising    Past Medical History  Diagnosis Date  . Multiple sclerosis 1998    Sxs started 1998 with L body numbness. MRI c/w MS. Started Avonex. Saw Dr Leotis Shames at [facility] and switched to Betaseron and was on for 5 yrs but developed depression and site rxns and D/C'd 2006. While on Betaseron, occassionally required solumedrol for flares.  . Complete heart block June 2009    Presented with hypotension and pul edema. Pacemaker placed. Thallium stress test 7/09 negative for infiltrative disease or ischemia.  Followed Dr Graciela Husbands  . HOCM (hypertrophic obstructive cardiomyopathy) 2009    Discovered by ECHO when he developed CHB. Mgmt Dr Graciela Husbands  and Dr Regino Schultze at [facility]. Has outflow obstruction on ECHO 2011  . Right bundle branch block     Past Surgical History  Procedure Date  . Leg tendon surgery 2008    Os peroneus resection and peroneal tendon repair  . Pacemaker insertion 12/2007    Complete heart block    Current Outpatient Prescriptions  Medication Sig Dispense Refill  . Ascorbic Acid (VITAMIN C) 500 MG tablet Take 500 mg by mouth daily.        Marland Kitchen aspirin 81 MG EC tablet Take 81 mg by mouth daily.        . Cholecalciferol (VITAMIN D-3 PO) Take 1 capsule by mouth daily.        . Multiple Vitamin (MULTIVITAMIN) tablet Take 1 tablet by mouth daily.        . Omega-3 Fatty Acids (FISH OIL PO) Take 1  capsule by mouth daily.        . silodosin (RAPAFLO) 8 MG CAPS capsule Take 8 mg by mouth daily with breakfast.         Current Facility-Administered Medications  Medication Dose Route Frequency Provider Last Rate Last Dose  . 0.9 %  sodium chloride infusion  500 mL Intravenous Continuous Iva Boop, MD        No Known Allergies  Review of Systems negative except from HPI and PMH  Physical Exam Well developed and well nourished in no acute distress HENT normal E scleral and icterus clear Neck Supple JVP flat; carotids brisk and full Clear to ausculation Regular rate and rhythm, no murmurs with or without the Valsalva Soft with active bowel sounds No clubbing cyanosis and edema Alert and oriented, grossly normal motor and sensory function Skin Warm and Dry    Assessment and  Plan

## 2011-07-30 ENCOUNTER — Encounter: Payer: BC Managed Care – PPO | Admitting: *Deleted

## 2011-08-03 ENCOUNTER — Encounter: Payer: Self-pay | Admitting: *Deleted

## 2011-09-25 ENCOUNTER — Ambulatory Visit (INDEPENDENT_AMBULATORY_CARE_PROVIDER_SITE_OTHER): Payer: BC Managed Care – PPO | Admitting: *Deleted

## 2011-09-25 DIAGNOSIS — Z950 Presence of cardiac pacemaker: Secondary | ICD-10-CM

## 2011-09-25 DIAGNOSIS — I421 Obstructive hypertrophic cardiomyopathy: Secondary | ICD-10-CM

## 2011-09-25 DIAGNOSIS — I442 Atrioventricular block, complete: Secondary | ICD-10-CM

## 2011-09-27 ENCOUNTER — Encounter: Payer: Self-pay | Admitting: Internal Medicine

## 2011-09-28 LAB — REMOTE PACEMAKER DEVICE
AL IMPEDENCE PM: 529 Ohm
ATRIAL PACING PM: 15
BAMS-0001: 210 {beats}/min
RV LEAD IMPEDENCE PM: 543 Ohm
VENTRICULAR PACING PM: 100

## 2011-10-06 NOTE — Progress Notes (Signed)
Remote pacer check  

## 2011-10-09 ENCOUNTER — Encounter: Payer: Self-pay | Admitting: *Deleted

## 2011-12-31 ENCOUNTER — Encounter: Payer: BC Managed Care – PPO | Admitting: *Deleted

## 2012-01-04 ENCOUNTER — Encounter: Payer: Self-pay | Admitting: *Deleted

## 2012-01-10 ENCOUNTER — Encounter: Payer: Self-pay | Admitting: Internal Medicine

## 2012-01-11 ENCOUNTER — Ambulatory Visit (INDEPENDENT_AMBULATORY_CARE_PROVIDER_SITE_OTHER): Payer: BC Managed Care – PPO | Admitting: *Deleted

## 2012-01-11 DIAGNOSIS — I442 Atrioventricular block, complete: Secondary | ICD-10-CM

## 2012-01-11 LAB — REMOTE PACEMAKER DEVICE
AL THRESHOLD: 0.5 V
ATRIAL PACING PM: 18
BAMS-0001: 210 {beats}/min
RV LEAD IMPEDENCE PM: 519 Ohm
RV LEAD THRESHOLD: 0.625 V

## 2012-01-29 ENCOUNTER — Encounter: Payer: Self-pay | Admitting: *Deleted

## 2012-02-01 ENCOUNTER — Telehealth: Payer: Self-pay | Admitting: Internal Medicine

## 2012-02-01 NOTE — Telephone Encounter (Signed)
The patient would like Dr. Graciela Husbands to recommend a urologist for BPH issues. I will call him back with a recommendation.

## 2012-02-01 NOTE — Telephone Encounter (Signed)
I left a message for the patient to call. 

## 2012-02-01 NOTE — Telephone Encounter (Signed)
PT REQUESTING NURSE CALL

## 2012-02-01 NOTE — Telephone Encounter (Signed)
Pt rtn call, pls call  °

## 2012-02-09 NOTE — Telephone Encounter (Signed)
No specific recs

## 2012-02-11 NOTE — Telephone Encounter (Signed)
The patient's wife is aware Dr. Graciela Husbands has no specific recommendations. I have stated we would typically refer to [facility] which I mentioned to the patient previously. Per the patient's wife, she thinks the patient has already called and gotten an appointment with someone there.

## 2012-02-25 ENCOUNTER — Encounter: Payer: Self-pay | Admitting: Internal Medicine

## 2012-02-25 ENCOUNTER — Ambulatory Visit (INDEPENDENT_AMBULATORY_CARE_PROVIDER_SITE_OTHER): Payer: BC Managed Care – PPO | Admitting: Internal Medicine

## 2012-02-25 VITALS — BP 96/71 | HR 96 | Ht 70.0 in | Wt 144.4 lb

## 2012-02-25 DIAGNOSIS — I442 Atrioventricular block, complete: Secondary | ICD-10-CM

## 2012-02-25 DIAGNOSIS — R079 Chest pain, unspecified: Secondary | ICD-10-CM | POA: Insufficient documentation

## 2012-02-25 LAB — PACEMAKER DEVICE OBSERVATION
AL AMPLITUDE: 2.8 mv
AL IMPEDENCE PM: 529 Ohm
BAMS-0001: 210 {beats}/min
BATTERY VOLTAGE: 2.8 V
RV LEAD IMPEDENCE PM: 519 Ohm
VENTRICULAR PACING PM: 100

## 2012-02-25 NOTE — Progress Notes (Signed)
  HPI  Rodney Adkins is a 56 y.o. 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 After discussion with Dr. Regino Schultze at [facility] we undertook stress echo looking to see the effects on the mitral valve and left ventricular outflow tract. Nothing was noted by Dr. Myrtis Ser Also per does so slowly that he was having high rate Wenckebach behavior resulting in extreme exercise tolerance. He underwent treadmill testing earlier this year with subsequent reprogramming which has been associated with a marked improvement in exercise tolerance and quality of life. He is now exercising   He has had a couple of episodes of left-sided chest pain. They have not been particularly related to exertion but have been related to anxiety. He is not fit. He does walk and has not had any symptoms  Past Medical History  Diagnosis Date  . Multiple sclerosis 1998    Sxs started 1998 with L body numbness. MRI c/w MS. Started Avonex. Saw Dr Leotis Shames at [facility] and switched to Betaseron and was on for 5 yrs but developed depression and site rxns and D/C'd 2006. While on Betaseron, occassionally required solumedrol for flares.  . Complete heart block June 2009    Presented with hypotension and pul edema. Pacemaker placed. Thallium stress test 7/09 negative for infiltrative disease or ischemia.  Followed Dr Graciela Husbands  . HOCM (hypertrophic obstructive cardiomyopathy) 2009    Discovered by ECHO when he developed CHB. Mgmt Dr Graciela Husbands  and Dr Regino Schultze at [facility]. Has outflow obstruction on ECHO 2011  . Right bundle branch block     Past Surgical History  Procedure Date  . Leg tendon surgery 2008    Os peroneus resection and peroneal tendon repair  . Pacemaker insertion 12/2007    Complete heart block    Current Outpatient Prescriptions  Medication Sig Dispense Refill  . Ascorbic Acid (VITAMIN C) 500 MG tablet Take 500 mg by mouth daily.        Marland Kitchen aspirin 81 MG EC tablet Take 81 mg by mouth daily.          . Cholecalciferol (VITAMIN D-3 PO) Take 1 capsule by mouth daily.        . Multiple Vitamin (MULTIVITAMIN) tablet Take 1 tablet by mouth daily.        . Omega-3 Fatty Acids (FISH OIL PO) Take 1 capsule by mouth daily.        . silodosin (RAPAFLO) 8 MG CAPS capsule Take 8 mg by mouth daily with breakfast.         Current Facility-Administered Medications  Medication Dose Route Frequency Provider Last Rate Last Dose  . DISCONTD: 0.9 %  sodium chloride infusion  500 mL Intravenous Continuous Iva Boop, MD        No Known Allergies  Review of Systems negative except from HPI and PMH  Physical Exam Well developed and well nourished in no acute distress HENT normal E scleral and icterus clear Neck Supple JVP flat; carotids brisk and full Device pocket well healed; without hematoma or erythema Clear to ausculation Regular rate and rhythm, no murmurs with or without the Valsalva Soft with active bowel sounds No clubbing cyanosis and edema Alert and oriented, grossly normal motor and sensory function Skin Warm and Dry  ECG demonstrates AV pacing  Assessment and  Plan

## 2012-02-25 NOTE — Patient Instructions (Addendum)
Please call Dr. Odessa Fleming nurse, Herbert Seta, if you decide to proceed with cardiac catheterization.  Remote monitoring is used to monitor your Pacemaker of ICD from home. This monitoring reduces the number of office visits required to check your device to one time per year. It allows [place] to keep an eye on the functioning of your device to ensure it is working properly. You are scheduled for a device check from home on 05/30/12. You may send your transmission at any time that day. If you have a wireless device, the transmission will be sent automatically. After your physician reviews your transmission, you will receive a postcard with your next transmission date.  Your physician wants you to follow-up in: 1 year with Dr. Graciela Husbands. You will receive a reminder letter in the mail two months in advance. If you don't receive a letter, please call our office to schedule the follow-up appointment.

## 2012-02-25 NOTE — Assessment & Plan Note (Signed)
The patient has had recurrent chest pain prompting emergency room evaluation a number of occasions. This is both typical and atypical features. His electrocardiogram is uninterpretable because of AV pacing; the presence of a right ventricular pacing lead precludes the utility of CT scanning.  We have discussed diagnostic tools and with recurrent ER visits( or almost visits), his pretest probability is sufficiently high that a post test prob with a nuclear test would not preclude in my mind the need for cath so I have recommended that  eh will need an FLP  He will let us know what he decides  We spent 35 min discussing the above

## 2012-05-30 ENCOUNTER — Encounter: Payer: BC Managed Care – PPO | Admitting: *Deleted

## 2012-06-03 ENCOUNTER — Ambulatory Visit (INDEPENDENT_AMBULATORY_CARE_PROVIDER_SITE_OTHER): Payer: BC Managed Care – PPO | Admitting: Family Medicine

## 2012-06-03 ENCOUNTER — Encounter: Payer: Self-pay | Admitting: Family Medicine

## 2012-06-03 VITALS — BP 116/72 | HR 73 | Temp 97.6°F | Resp 16 | Ht 68.75 in | Wt 147.0 lb

## 2012-06-03 DIAGNOSIS — Z950 Presence of cardiac pacemaker: Secondary | ICD-10-CM

## 2012-06-03 DIAGNOSIS — R011 Cardiac murmur, unspecified: Secondary | ICD-10-CM

## 2012-06-03 DIAGNOSIS — R209 Unspecified disturbances of skin sensation: Secondary | ICD-10-CM

## 2012-06-03 DIAGNOSIS — R200 Anesthesia of skin: Secondary | ICD-10-CM

## 2012-06-03 DIAGNOSIS — G35 Multiple sclerosis: Secondary | ICD-10-CM

## 2012-06-03 NOTE — Patient Instructions (Addendum)
Followup with your neurologist. If you feel like this is getting steadily worse however, gone to the emergency room.

## 2012-06-03 NOTE — Progress Notes (Signed)
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 Has had numbness in his left side. It has gradually gotten more obvious to him. He has a numbness sensation in his face also on the left. He has not had any motor changes. He does have a history of multiple sclerosis. He is concerned about his heart. He has been followed by Dr. Graciela Husbands for his heart, having received a pacemaker for heart block. That has functioned well. He does have a history of hypertrophic obstructive cardiomyopathy, and a murmur from his valve. He has been her alert and oriented. 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 Symptoms have persisted and may be a little worse ever since he got up.  Objective: Alert oriented in man in no acute distress. HEENT TMs normal. Throat clear. Neck supple without nodes thyromegaly. No carotid bruits. Chest is clear to auscultation. Heart regular with a grade 3/6 rumbling murmur at the left sternal border. His pacemaker is located in the left anterior chest wall. Abdomen soft without mass or tenderness. Finger to nose normal. Cranial nerves grossly normal. Good grip of upper extremities. Gait is normal without any gait disturbance.  Assessment: Left numbness, probably from his MS Pacemaker History of hypertrophic obstructive cardiomyopathy  Plan: Check EKG though it probably will not be very revealing because of the pacemaker. Proceed from there in making  a decision as to what to do.  EKG looks essentially the same  Discussed with the patient that I do not believe this is cardiac or a CVA, most consistent with his MS. I think he needs to see his neurologist back, and he will do so on Monday. He was wanted her to return if worse.

## 2012-06-07 ENCOUNTER — Encounter: Payer: Self-pay | Admitting: *Deleted

## 2012-06-19 ENCOUNTER — Encounter: Payer: Self-pay | Admitting: Internal Medicine

## 2012-06-20 ENCOUNTER — Ambulatory Visit (INDEPENDENT_AMBULATORY_CARE_PROVIDER_SITE_OTHER): Payer: BC Managed Care – PPO | Admitting: *Deleted

## 2012-06-20 DIAGNOSIS — Z950 Presence of cardiac pacemaker: Secondary | ICD-10-CM

## 2012-06-20 DIAGNOSIS — I442 Atrioventricular block, complete: Secondary | ICD-10-CM

## 2012-07-01 LAB — REMOTE PACEMAKER DEVICE
AL AMPLITUDE: 2.8 mv
AL THRESHOLD: 0.5 V
BAMS-0001: 210 {beats}/min
RV LEAD IMPEDENCE PM: 552 Ohm
RV LEAD THRESHOLD: 0.5 V

## 2012-07-12 ENCOUNTER — Encounter: Payer: Self-pay | Admitting: *Deleted

## 2012-09-26 ENCOUNTER — Encounter: Payer: BC Managed Care – PPO | Admitting: *Deleted

## 2012-10-06 ENCOUNTER — Encounter: Payer: Self-pay | Admitting: *Deleted

## 2013-02-13 ENCOUNTER — Ambulatory Visit (INDEPENDENT_AMBULATORY_CARE_PROVIDER_SITE_OTHER): Payer: BC Managed Care – PPO | Admitting: Internal Medicine

## 2013-02-13 ENCOUNTER — Encounter: Payer: Self-pay | Admitting: Internal Medicine

## 2013-02-13 VITALS — BP 111/68 | HR 67 | Ht 70.0 in | Wt 144.0 lb

## 2013-02-13 DIAGNOSIS — I421 Obstructive hypertrophic cardiomyopathy: Secondary | ICD-10-CM

## 2013-02-13 DIAGNOSIS — I4949 Other premature depolarization: Secondary | ICD-10-CM

## 2013-02-13 DIAGNOSIS — Z950 Presence of cardiac pacemaker: Secondary | ICD-10-CM

## 2013-02-13 DIAGNOSIS — I493 Ventricular premature depolarization: Secondary | ICD-10-CM | POA: Insufficient documentation

## 2013-02-13 DIAGNOSIS — I442 Atrioventricular block, complete: Secondary | ICD-10-CM

## 2013-02-13 LAB — PACEMAKER DEVICE OBSERVATION
AL AMPLITUDE: 5.6 mv
AL IMPEDENCE PM: 537 Ohm
AL THRESHOLD: 0.5 V
BATTERY VOLTAGE: 2.8 V
RV LEAD IMPEDENCE PM: 546 Ohm
VENTRICULAR PACING PM: 100

## 2013-02-13 NOTE — Progress Notes (Signed)
Patient Care Team: Cala Bradford, MD as PCP - General (Family Medicine) Iva Boop, MD as Consulting Physician (Gastroenterology) [name], MD as Consulting Physician (Cardiology)   HPI  Rodney Adkins is a 57 y.o. 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 After discussion with Dr. Regino Schultze at [facility] we undertook stress echo looking to see the effects on the mitral valve and left ventricular outflow tract. Nothing was noted by Dr. Myrtis Ser  It also turned out he was having high rate Wenckebach behavior resulting in extreme exercise tolerance. He underwent treadmill testing earlier this year with subsequent reprogramming which has been associated with a marked improvement in exercise tolerance and quality of life.  As occasional episodes of shortness of breath that lasts seconds      Past Medical History  Diagnosis Date  . Multiple sclerosis 1998    Sxs started 1998 with L body numbness. MRI c/w MS. Started Avonex. Saw Dr Leotis Shames at [facility] and switched to Betaseron and was on for 5 yrs but developed depression and site rxns and D/C'd 2006. While on Betaseron, occassionally required solumedrol for flares.  . Complete heart block June 2009    Presented with hypotension and pul edema. Pacemaker placed. Thallium stress test 7/09 negative for infiltrative disease or ischemia.  Followed Dr Graciela Husbands  . HOCM (hypertrophic obstructive cardiomyopathy) 2009    Discovered by ECHO when he developed CHB. Mgmt Dr Graciela Husbands  and Dr Regino Schultze at [facility]. Has outflow obstruction on ECHO 2011  . Right bundle branch block     Past Surgical History  Procedure Laterality Date  . Leg tendon surgery  2008    Os peroneus resection and peroneal tendon repair  . Pacemaker insertion  12/2007    Complete heart block    Current Outpatient Prescriptions  Medication Sig Dispense Refill  . Ascorbic Acid (VITAMIN C) 500 MG tablet Take 500 mg by mouth daily.        Marland Kitchen aspirin  81 MG EC tablet Take 81 mg by mouth daily.        . Cholecalciferol (VITAMIN D-3 PO) Take 1 capsule by mouth daily.        . Multiple Vitamin (MULTIVITAMIN) tablet Take 1 tablet by mouth daily.        . Omega-3 Fatty Acids (FISH OIL PO) Take 1 capsule by mouth daily.         No current facility-administered medications for this visit.    No Known Allergies  Review of Systems negative except from HPI and PMH  Physical Exam BP 111/68  Pulse 67  Ht 5\' 10"  (1.778 m)  Wt 144 lb (65.318 kg)  BMI 20.66 kg/m2 Well developed and well nourished in no acute distress HENT normal E scleral and icterus clear Neck Supple JVP flat; carotids brisk and full Clear to ausculation Device pocket well healed; without hematoma or erythema.  There is no tethering Regular rate and rhythm, no murmurs gallops or rub Soft with active bowel sounds No clubbing cyanosis none Edema Alert and oriented, grossly normal motor and sensory function Skin Warm and Dry  ECG AV pacing  Assessment and  Plan

## 2013-02-13 NOTE — Assessment & Plan Note (Signed)
Continue current medications. 

## 2013-02-13 NOTE — Assessment & Plan Note (Signed)
Identify a pacemaker interrogation. I suspect this is the cause for his dyspnea complaints.

## 2013-02-13 NOTE — Assessment & Plan Note (Signed)
Stable post pacing 

## 2013-02-13 NOTE — Assessment & Plan Note (Signed)
The patient's device was interrogated.  The information was reviewed. No changes were made in the programming.    

## 2013-02-13 NOTE — Patient Instructions (Signed)
Your physician wants you to follow-up in: ONE YEAR WITH DR KLEIN You will receive a reminder letter in the mail two months in advance. If you don't receive a letter, please call our office to schedule the follow-up appointment.    

## 2013-05-22 ENCOUNTER — Ambulatory Visit (INDEPENDENT_AMBULATORY_CARE_PROVIDER_SITE_OTHER): Payer: BC Managed Care – PPO | Admitting: *Deleted

## 2013-05-22 DIAGNOSIS — Z950 Presence of cardiac pacemaker: Secondary | ICD-10-CM

## 2013-05-22 DIAGNOSIS — I442 Atrioventricular block, complete: Secondary | ICD-10-CM

## 2013-05-23 ENCOUNTER — Encounter: Payer: Self-pay | Admitting: Internal Medicine

## 2013-05-23 LAB — MDC_IDC_ENUM_SESS_TYPE_REMOTE
Battery Remaining Longevity: 83 mo
Brady Statistic AP VS Percent: 0 %
Brady Statistic AS VP Percent: 77 %
Lead Channel Impedance Value: 569 Ohm
Lead Channel Pacing Threshold Amplitude: 0.75 V
Lead Channel Pacing Threshold Pulse Width: 0.4 ms
Lead Channel Sensing Intrinsic Amplitude: 2.8 mV
Lead Channel Setting Pacing Amplitude: 2 V

## 2013-06-06 ENCOUNTER — Encounter: Payer: Self-pay | Admitting: *Deleted

## 2013-08-01 ENCOUNTER — Telehealth: Payer: Self-pay | Admitting: Internal Medicine

## 2013-08-01 ENCOUNTER — Encounter: Payer: Self-pay | Admitting: Physician Assistant

## 2013-08-01 NOTE — Telephone Encounter (Signed)
Returned call to patient he stated he has had sob for 1 week or more.Stated he notices sob when he is walking.Also has had discomfort in left arm when he walks.No chest pain.Appointment scheduled with Melina Copa PA 08/02/13 at 10:00 am.Advised to go to ER if needed.

## 2013-08-01 NOTE — Telephone Encounter (Signed)
New problem ° ° °Pt having sob °

## 2013-08-02 ENCOUNTER — Encounter: Payer: Self-pay | Admitting: Physician Assistant

## 2013-08-02 ENCOUNTER — Ambulatory Visit (INDEPENDENT_AMBULATORY_CARE_PROVIDER_SITE_OTHER): Payer: BC Managed Care – PPO | Admitting: *Deleted

## 2013-08-02 ENCOUNTER — Ambulatory Visit (INDEPENDENT_AMBULATORY_CARE_PROVIDER_SITE_OTHER): Payer: BC Managed Care – PPO | Admitting: Physician Assistant

## 2013-08-02 ENCOUNTER — Encounter (INDEPENDENT_AMBULATORY_CARE_PROVIDER_SITE_OTHER): Payer: Self-pay

## 2013-08-02 VITALS — BP 120/66 | HR 71 | Ht 70.0 in | Wt 153.0 lb

## 2013-08-02 DIAGNOSIS — I442 Atrioventricular block, complete: Secondary | ICD-10-CM

## 2013-08-02 DIAGNOSIS — G35 Multiple sclerosis: Secondary | ICD-10-CM

## 2013-08-02 DIAGNOSIS — I4510 Unspecified right bundle-branch block: Secondary | ICD-10-CM

## 2013-08-02 DIAGNOSIS — R002 Palpitations: Secondary | ICD-10-CM

## 2013-08-02 DIAGNOSIS — I421 Obstructive hypertrophic cardiomyopathy: Secondary | ICD-10-CM

## 2013-08-02 DIAGNOSIS — R079 Chest pain, unspecified: Secondary | ICD-10-CM

## 2013-08-02 DIAGNOSIS — I4949 Other premature depolarization: Secondary | ICD-10-CM

## 2013-08-02 DIAGNOSIS — R0602 Shortness of breath: Secondary | ICD-10-CM | POA: Insufficient documentation

## 2013-08-02 LAB — MDC_IDC_ENUM_SESS_TYPE_INCLINIC
Battery Impedance: 504 Ohm
Brady Statistic AP VS Percent: 0 %
Brady Statistic AS VP Percent: 78 %
Brady Statistic AS VS Percent: 0 %
Date Time Interrogation Session: 20150128112002
Lead Channel Impedance Value: 521 Ohm
Lead Channel Pacing Threshold Amplitude: 0.75 V
Lead Channel Pacing Threshold Amplitude: 0.75 V
Lead Channel Pacing Threshold Pulse Width: 0.4 ms
Lead Channel Pacing Threshold Pulse Width: 0.4 ms
Lead Channel Sensing Intrinsic Amplitude: 4 mV
Lead Channel Setting Pacing Amplitude: 2.5 V
MDC IDC MSMT BATTERY REMAINING LONGEVITY: 82 mo
MDC IDC MSMT BATTERY VOLTAGE: 2.79 V
MDC IDC MSMT LEADCHNL RV IMPEDANCE VALUE: 610 Ohm
MDC IDC SET LEADCHNL RA PACING AMPLITUDE: 2 V
MDC IDC SET LEADCHNL RV PACING PULSEWIDTH: 0.4 ms
MDC IDC SET LEADCHNL RV SENSING SENSITIVITY: 5.6 mV
MDC IDC STAT BRADY AP VP PERCENT: 22 %

## 2013-08-02 NOTE — Patient Instructions (Signed)
Your physician recommends that you continue on your current medications as directed. Please refer to the Current Medication list given to you today.  Your physician recommends that you have labs today: CMET, CBC, BNP, Lipids, TSH  Your physician has requested that you have an echocardiogram. Echocardiography is a painless test that uses sound waves to create images of your heart. It provides your doctor with information about the size and shape of your heart and how well your heart's chambers and valves are working. This procedure takes approximately one hour. There are no  restrictions for this procedure.  Your physician has requested that you have an exercise tolerance test. For further information please visit [email or web address]. Please also follow instruction sheet, as given.  Your physician recommends that you schedule a follow-up appointment in: 2 weeks with Dr. Caryl Comes or Extender

## 2013-08-02 NOTE — Progress Notes (Signed)
[address], [place], [place]  [address] Phone: [phone] Fax:  [phone]  Date:  08/02/2013   ID:  Rodney Adkins, Rodney Adkins Sep 02, 1955, MRN [id number]  PCP:  Vidal Schwalbe, MD  Cardiologist:  Dr. Caryl Comes   History of Present Illness: Rodney Adkins is a 58 y.o. male with history of HOCM, multiple sclerosis, CHB s/p Medtronic pacemaker 2009, RBBB who presents to clinic for evaluation of SOB. He has previous evaluation of this in 2012 with grossly unremarkable PFTs and CPST that was noteable for Wenckebach at high-rate episodes (with exercise, HR paradoxicaly dropped into the 80s). Pacer was reprogrammed with improvement in sx and he was doing well at last OV 02/2013. Thallium nuc in 2009: "Rest reinjection thallium revealing a small inferoseptal defect, most likely due to inferior thinning. The remaining perfusion is normal. This does not appear to suggest an infiltrative process in the septum."  For the past 2-3 weeks he has been having exertional dyspnea associated with L arm heaviness i.e. with hills. For the past 5 days he was on an ACTH analog for exacerbation of MS symptoms in his right hand. The past 3 days he's noticed intermittent tachypalpitations described as a fast pounding. This is associated with chest pressure and dyspnea. The palpitations occur when he exerts himself and tend to calm down when he rests. He also sometimes feels them when they're laying down. They vary in length of time sustained. He is not sure if he's had any chest pressure in the absence of palpitations. No syncope, bleeding diathesis, LEE. He is not tachycardic or tachypnic in the office. Weight is up a few pounds but he relates this to recent inactivity. Mild orthopnea. Pacemaker interrogation in the office today reveals no mode switches, no atrial high rate episodes and no ventricular arrhythmias.  Recent Labs: No results found for requested labs within last 365 days.  Wt Readings from Last 3  Encounters:  08/02/13 153 lb (69.4 kg)  02/13/13 144 lb (65.318 kg)  06/03/12 147 lb (66.679 kg)     Past Medical History  Diagnosis Date  . Multiple sclerosis 1998    Sxs started 1998 with L body numbness. MRI c/w MS. Started Avonex. Saw Dr [name] at [facility] and switched to Betaseron and was on for 5 yrs but developed depression and site rxns and D/C'd 2006. While on Betaseron, occassionally required solumedrol for flares.  . Complete heart block June 2009    a. Presented with hypotension/pulm edema 12/2007 when HOCM was also discovered. s/p dual chamber MDT pacemaker. Thallium stest 7/09 negative for infiltrative disease or ischemia.  Followed Dr [name]  Marland Kitchen HOCM (hypertrophic obstructive cardiomyopathy) 2009    Discovered by ECHO when he developed CHB. Mgmt Dr Caryl Comes  and Dr [name] at [facility]. Has outflow obstruction on ECHO 2011  . Right bundle branch block   . Wenckebach     a. Decreased ex tolerance 2010/2012 - underwent stress echo to look @ effects of MV/LVOT - nothing noted; although pt had high rate Wenckebach behavior with associated ex intol. Pacer reprogrammed with improvement in sx.  . SOB (shortness of breath)     a. PFTs 09/2010 essentially normal per pulm note (mild obst defect). b. 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  . Prostate enlargement     Current Outpatient Prescriptions  Medication Sig Dispense Refill  . Ascorbic Acid (VITAMIN C) 500 MG tablet Take 500 mg by mouth daily.        Marland Kitchen  aspirin 81 MG EC tablet Take 81 mg by mouth daily.        . Cholecalciferol (VITAMIN D-3 PO) Take 1 capsule by mouth daily.        . Multiple Vitamin (MULTIVITAMIN) tablet Take 1 tablet by mouth daily.        . Omega-3 Fatty Acids (FISH OIL PO) Take 1 capsule by mouth daily.         No current facility-administered medications for this visit.    Allergies:   Review of patient's allergies indicates no known allergies.   Social History:  The patient  reports that he has never  smoked. He has never used smokeless tobacco. He reports that he drinks alcohol. He reports that he does not use illicit drugs.   Family History:  The patient's family history includes Colon polyps in his father; Diabetes in his paternal grandmother. There is no history of Heart disease.   ROS:  Please see the history of present illness.    All other systems reviewed and negative.   PHYSICAL EXAM: VS:  BP 120/66  Pulse 71  Ht 5\' 10"  (1.778 m)  Wt 153 lb (69.4 kg)  BMI 21.95 kg/m2 Well nourished, well developed, in no acute distress HEENT: normal Neck: no JVD Cardiac:  normal S1, S2; RRR; 2/6 SEM best at LLSB and apex Lungs:  clear to auscultation bilaterally, no wheezing, rhonchi or rales Abd: soft, nontender, no hepatomegaly Ext: no edema Skin: warm and dry Neuro:  moves all extremities spontaneously, no focal abnormalities noted  EKG:  V paced 71bpm  ASSESSMENT AND PLAN:  1. Tachypalpitations in the setting of MDT pacemaker for CHB - no significant arrhythmias noted on interrogation. Discussed case with Dr. Caryl Comes and reviewed information. He recommended exercise treadmill with programmer in hand with him at bedside. We will work on scheduling this sooner rather than later. Check TSH. 2. Dyspnea/chest discomfort - check basic labs including lytes, CBC, TSH, BNP given that he has not had these done in quite some time to comprehensively evaluate. Will also check FLP and CMET to risk stratify for CAD. Also d/w further eval with Dr. Caryl Comes - will start with GXT as above, continue ASA, and consider pursuing more intensive evaluation based on those results. No NTG given HOCM. Return precautions given to patient who verbalized understanding. 3. HOCM - update echocardiogram. In the past he has been disinclined to take beta blocker per notes. We can revisit this based on further information. 4. Multiple sclerosis - followed by neurologist.  Dispo: studies as above, f/u afterwards (approx 2  weeks) or sooner if studies require further intervention.  Signed, Melina Copa, PA-C  08/02/2013 11:08 AM

## 2013-08-02 NOTE — Progress Notes (Signed)
Pacemaker check in clinic. Normal device function. Thresholds, sensing, impedances consistent with previous measurements. Device programmed to maximize longevity. No mode switch or high ventricular rates noted. Device programmed at appropriate safety margins. Histogram distribution appropriate for patient activity level. Device programmed to optimize intrinsic conduction. Estimated longevity 7 years. Patient enrolled in remote follow-up/TTM's with [employer]. Plan to follow every 3 months remotely and see annually in office. Patient education completed.  [facility] 11/06/13.

## 2013-08-07 ENCOUNTER — Ambulatory Visit (INDEPENDENT_AMBULATORY_CARE_PROVIDER_SITE_OTHER): Payer: BC Managed Care – PPO | Admitting: *Deleted

## 2013-08-07 DIAGNOSIS — I442 Atrioventricular block, complete: Secondary | ICD-10-CM

## 2013-08-07 DIAGNOSIS — I4949 Other premature depolarization: Secondary | ICD-10-CM

## 2013-08-07 DIAGNOSIS — R634 Abnormal weight loss: Secondary | ICD-10-CM

## 2013-08-07 DIAGNOSIS — R0602 Shortness of breath: Secondary | ICD-10-CM

## 2013-08-07 DIAGNOSIS — R079 Chest pain, unspecified: Secondary | ICD-10-CM

## 2013-08-07 LAB — COMPREHENSIVE METABOLIC PANEL
ALT: 17 U/L (ref 0–53)
AST: 25 U/L (ref 0–37)
Albumin: 4 g/dL (ref 3.5–5.2)
Alkaline Phosphatase: 78 U/L (ref 39–117)
BUN: 18 mg/dL (ref 6–23)
CALCIUM: 9.1 mg/dL (ref 8.4–10.5)
CHLORIDE: 103 meq/L (ref 96–112)
CO2: 30 meq/L (ref 19–32)
CREATININE: 0.9 mg/dL (ref 0.4–1.5)
GFR: 94.3 mL/min (ref >60.00)
GLUCOSE: 94 mg/dL (ref 70–99)
Potassium: 4.2 mEq/L (ref 3.5–5.1)
Sodium: 138 mEq/L (ref 135–145)
TOTAL PROTEIN: 6.3 g/dL (ref 6.0–8.3)
Total Bilirubin: 1 mg/dL (ref 0.3–1.2)

## 2013-08-07 LAB — LIPID PANEL
CHOL/HDL RATIO: 4
Cholesterol: 176 mg/dL (ref 0–200)
HDL: 47.5 mg/dL (ref >39.00)
LDL CALC: 106 mg/dL — AB (ref 0–99)
TRIGLYCERIDES: 112 mg/dL (ref 0.0–149.0)
VLDL: 22.4 mg/dL (ref 0.0–40.0)

## 2013-08-07 LAB — CBC WITH DIFFERENTIAL/PLATELET
Basophils Absolute: 0.1 10*3/uL (ref 0.0–0.1)
Basophils Relative: 0.4 % (ref 0.0–3.0)
Eosinophils Absolute: 0.3 10*3/uL (ref 0.0–0.7)
Eosinophils Relative: 1.9 % (ref 0.0–5.0)
HCT: 48.1 % (ref 39.0–52.0)
HEMOGLOBIN: 15.9 g/dL (ref 13.0–17.0)
LYMPHS PCT: 12.7 % (ref 12.0–46.0)
Lymphs Abs: 1.7 10*3/uL (ref 0.7–4.0)
MCHC: 33.1 g/dL (ref 30.0–36.0)
MCV: 92.5 fl (ref 78.0–100.0)
MONOS PCT: 14.5 % — AB (ref 3.0–12.0)
Monocytes Absolute: 1.9 10*3/uL — ABNORMAL HIGH (ref 0.1–1.0)
Neutro Abs: 9.3 10*3/uL — ABNORMAL HIGH (ref 1.4–7.7)
Neutrophils Relative %: 70.5 % (ref 43.0–77.0)
PLATELETS: 205 10*3/uL (ref 150.0–400.0)
RBC: 5.2 Mil/uL (ref 4.22–5.81)
RDW: 13.4 % (ref 11.5–14.6)
WBC: 13.2 10*3/uL — ABNORMAL HIGH (ref 4.5–10.5)

## 2013-08-07 LAB — BRAIN NATRIURETIC PEPTIDE: Pro B Natriuretic peptide (BNP): 182 pg/mL — ABNORMAL HIGH (ref 0.0–100.0)

## 2013-08-07 LAB — TSH: TSH: 1.81 u[IU]/mL (ref 0.35–5.50)

## 2013-08-08 ENCOUNTER — Ambulatory Visit (INDEPENDENT_AMBULATORY_CARE_PROVIDER_SITE_OTHER): Payer: BC Managed Care – PPO | Admitting: Internal Medicine

## 2013-08-08 DIAGNOSIS — R0602 Shortness of breath: Secondary | ICD-10-CM

## 2013-08-08 DIAGNOSIS — R079 Chest pain, unspecified: Secondary | ICD-10-CM

## 2013-08-08 NOTE — Progress Notes (Signed)
The patient was admitted for treadmill testing. He was diffusely pacing throughout. Heart rate was approximately 182 minutes of stage I and the test was terminated 1 minute into stage II a heart rate of 135. Device function was normal. We discussed beta blockers. However, he is in the midst of a flare of his MS and he is much better this week than last week. We will hold off on beta blocker therapy.  He also tells me however that he has been having exertional chest discomfort that radiates into his left arm. It resolved with rest. We discussed nuclear stress testing versus catheterization. We will plan to pursue catheterization. He will call as tomorrow to find a day and will plan to do a in 1-2 weeks as the symptoms are stable but in the context of his MS would like that of a to  fully resovle  Well developed and nourished in no acute distress HENT normal Neck supple with JVP-flat Clear Regular rate and rhythm, no murmurs or gallops Abd-soft with active BS No Clubbing cyanosis edema Skin-warm and dry A & Oriented  Grossly normal sensory and motor function  ECG  NSR with P-synchronous/ AV  pacing   

## 2013-08-08 NOTE — Progress Notes (Signed)
Exercise Treadmill Test  Pre-Exercise Testing Evaluation Rhythm: normal sinus  Rate: 81 bpm     Test  Exercise Tolerance Test Ordering MD: Virl Axe, MD  Interpreting MD: Virl Axe, MD  Unique Test No: 1  Treadmill:  1  Indication for ETT: chest pain - rule out ischemia/SOB  Contraindication to ETT: No   Stress Modality: exercise - treadmill  Cardiac Imaging Performed: non   Protocol: standard Bruce - maximal  Max BP:  /  Max MPHR (bpm):  162 85% MPR (bpm):  138  MPHR obtained (bpm):  140 % MPHR obtained:    Reached 85% MPHR (min:sec):   Total Exercise Time (min-sec):    Workload in METS:   Borg Scale:   Reason ETT Terminated:      ST Segment Analysis At Rest:  With Exercise:   Other Information Arrhythmia:   Angina during ETT:   Quality of ETT:    ETT Interpretation:    Comments: Pk HR unassoc with pacer upeer rate behaviousr  Recommendations: Not an issue of pacer function  Need to address sinus tach

## 2013-08-09 ENCOUNTER — Encounter: Payer: Self-pay | Admitting: Cardiology

## 2013-08-09 ENCOUNTER — Ambulatory Visit (HOSPITAL_COMMUNITY): Payer: BC Managed Care – PPO | Attending: Cardiology | Admitting: Radiology

## 2013-08-09 DIAGNOSIS — G35 Multiple sclerosis: Secondary | ICD-10-CM | POA: Insufficient documentation

## 2013-08-09 DIAGNOSIS — I422 Other hypertrophic cardiomyopathy: Secondary | ICD-10-CM | POA: Insufficient documentation

## 2013-08-09 DIAGNOSIS — I4510 Unspecified right bundle-branch block: Secondary | ICD-10-CM | POA: Insufficient documentation

## 2013-08-09 DIAGNOSIS — R0602 Shortness of breath: Secondary | ICD-10-CM | POA: Insufficient documentation

## 2013-08-09 DIAGNOSIS — Z950 Presence of cardiac pacemaker: Secondary | ICD-10-CM | POA: Insufficient documentation

## 2013-08-09 DIAGNOSIS — I359 Nonrheumatic aortic valve disorder, unspecified: Secondary | ICD-10-CM | POA: Insufficient documentation

## 2013-08-09 NOTE — Progress Notes (Signed)
Echocardiogram performed.  

## 2013-08-10 ENCOUNTER — Telehealth: Payer: Self-pay | Admitting: *Deleted

## 2013-08-10 ENCOUNTER — Telehealth: Payer: Self-pay | Admitting: Internal Medicine

## 2013-08-10 NOTE — Telephone Encounter (Signed)
lmtcb to schedule catheterization in next week/two

## 2013-08-10 NOTE — Telephone Encounter (Signed)
lmptcb for lab results 

## 2013-08-10 NOTE — Telephone Encounter (Signed)
pt notified about echo results with verbal understanding. Pt states he did not get lab results yet. I advised results in but not read yet. I will see if I can get results read today. I asked if he wants results to be mailed to him; he said yes.

## 2013-08-10 NOTE — Telephone Encounter (Signed)
Follow Up    Pt is following up on information regarding scheduling his cath. Please call.

## 2013-08-11 NOTE — Telephone Encounter (Signed)
Follow up ° ° ° ° °Returning a nurses call for lab results °

## 2013-08-11 NOTE — Telephone Encounter (Signed)
lmptcb for lab results 

## 2013-08-14 ENCOUNTER — Encounter: Payer: Self-pay | Admitting: *Deleted

## 2013-08-14 ENCOUNTER — Encounter (HOSPITAL_COMMUNITY): Payer: Self-pay | Admitting: Pharmacy Technician

## 2013-08-14 ENCOUNTER — Telehealth: Payer: Self-pay | Admitting: Internal Medicine

## 2013-08-14 ENCOUNTER — Other Ambulatory Visit: Payer: Self-pay | Admitting: *Deleted

## 2013-08-14 DIAGNOSIS — Z01812 Encounter for preprocedural laboratory examination: Secondary | ICD-10-CM

## 2013-08-14 DIAGNOSIS — R0789 Other chest pain: Secondary | ICD-10-CM

## 2013-08-14 NOTE — Telephone Encounter (Signed)
Informed pt cath scheduled for 2/17 with Dr. Tamala Julian. Pre-procedure labs 2/12. Letter of instructions left at front desk for pt pickup. Pt verbalized understanding agreeable to plan.

## 2013-08-14 NOTE — Telephone Encounter (Signed)
New message    Returning a nurses call to set up a cath

## 2013-08-14 NOTE — Telephone Encounter (Signed)
s/w pt's wife this morning about lab results from [facility], [place], I apologized that I did not see that Laurence Aly. RN Dr. Olin Pia nurse called on 08/10/13, wife said that was ok because she had questions anyway. Wife states Dr. Caryl Comes said something to them about a cath radial artery. I looked back and saw a progress note from 08/08/13 Dr. Caryl Comes, he states; We discussed nuclear stress testing versus catheterization. We will plan to pursue catheterization. He will call as tomorrow to find a day and will plan to do a in 1-2 weeks as the symptoms are stable but in the context of his MS would like that of a to fully resolve.  So wife states today to me that they would like to go ahead and schedule this heart cath I advised that I will d/w Dr. Caryl Comes and his RN about this and either Sherri or I can call and get cath scheduled and we will c/c to schedule. Mrs. Dollinger said ok. She also states to me that she wants to know from Dr. Caryl Comes what does he think about pt having "supervised exercise program" . She states she is concerned about doing anything because she states he is afraid he is going to "hurt his heart". I explained that [facility] does have cardiac rehab which is a supervised exercise program so I will ask Dr. Caryl Comes about this as well. Mrs. Whittley said thank you for our help.

## 2013-08-15 ENCOUNTER — Other Ambulatory Visit: Payer: Self-pay | Admitting: Interventional Cardiology

## 2013-08-15 DIAGNOSIS — R072 Precordial pain: Secondary | ICD-10-CM

## 2013-08-17 ENCOUNTER — Other Ambulatory Visit (INDEPENDENT_AMBULATORY_CARE_PROVIDER_SITE_OTHER): Payer: BC Managed Care – PPO

## 2013-08-17 DIAGNOSIS — R0789 Other chest pain: Secondary | ICD-10-CM

## 2013-08-17 DIAGNOSIS — Z01812 Encounter for preprocedural laboratory examination: Secondary | ICD-10-CM

## 2013-08-17 LAB — CBC WITH DIFFERENTIAL/PLATELET
BASOS ABS: 0.1 10*3/uL (ref 0.0–0.1)
Basophils Relative: 0.8 % (ref 0.0–3.0)
EOS ABS: 0.1 10*3/uL (ref 0.0–0.7)
Eosinophils Relative: 1.8 % (ref 0.0–5.0)
HEMATOCRIT: 46.3 % (ref 39.0–52.0)
Hemoglobin: 15.4 g/dL (ref 13.0–17.0)
LYMPHS ABS: 1.4 10*3/uL (ref 0.7–4.0)
LYMPHS PCT: 21.7 % (ref 12.0–46.0)
MCHC: 33.4 g/dL (ref 30.0–36.0)
MCV: 92.5 fl (ref 78.0–100.0)
MONOS PCT: 10.8 % (ref 3.0–12.0)
Monocytes Absolute: 0.7 10*3/uL (ref 0.1–1.0)
Neutro Abs: 4.3 10*3/uL (ref 1.4–7.7)
Neutrophils Relative %: 64.9 % (ref 43.0–77.0)
Platelets: 188 10*3/uL (ref 150.0–400.0)
RBC: 5 Mil/uL (ref 4.22–5.81)
RDW: 12.6 % (ref 11.5–14.6)
WBC: 6.6 10*3/uL (ref 4.5–10.5)

## 2013-08-17 LAB — BASIC METABOLIC PANEL
BUN: 15 mg/dL (ref 6–23)
CO2: 29 meq/L (ref 19–32)
Calcium: 9.2 mg/dL (ref 8.4–10.5)
Chloride: 105 mEq/L (ref 96–112)
Creatinine, Ser: 0.8 mg/dL (ref 0.4–1.5)
GFR: 99.49 mL/min (ref >60.00)
GLUCOSE: 90 mg/dL (ref 70–99)
POTASSIUM: 3.9 meq/L (ref 3.5–5.1)
SODIUM: 141 meq/L (ref 135–145)

## 2013-08-21 ENCOUNTER — Encounter: Payer: Self-pay | Admitting: Internal Medicine

## 2013-08-22 ENCOUNTER — Encounter (HOSPITAL_COMMUNITY)
Admission: RE | Disposition: A | Discharge status: Home / Self Care | Payer: Self-pay | Source: Ambulatory Visit | Attending: Interventional Cardiology

## 2013-08-22 ENCOUNTER — Ambulatory Visit: Payer: BC Managed Care – PPO | Admitting: Cardiology

## 2013-08-22 ENCOUNTER — Ambulatory Visit (HOSPITAL_COMMUNITY)
Admission: RE | Admit: 2013-08-22 | Discharge: 2013-08-22 | Disposition: A | Discharge status: Home / Self Care | Payer: BC Managed Care – PPO | Source: Ambulatory Visit | Attending: Interventional Cardiology | Admitting: Interventional Cardiology

## 2013-08-22 DIAGNOSIS — R072 Precordial pain: Secondary | ICD-10-CM

## 2013-08-22 DIAGNOSIS — I421 Obstructive hypertrophic cardiomyopathy: Secondary | ICD-10-CM | POA: Insufficient documentation

## 2013-08-22 DIAGNOSIS — R079 Chest pain, unspecified: Secondary | ICD-10-CM | POA: Insufficient documentation

## 2013-08-22 HISTORY — PX: LEFT HEART CATHETERIZATION WITH CORONARY ANGIOGRAM: SHX5451

## 2013-08-22 LAB — PROTIME-INR
INR: 1.07 (ref 0.00–1.49)
Prothrombin Time: 13.7 seconds (ref 11.6–15.2)

## 2013-08-22 SURGERY — LEFT HEART CATHETERIZATION WITH CORONARY ANGIOGRAM
Anesthesia: LOCAL

## 2013-08-22 MED ORDER — LIDOCAINE HCL (PF) 1 % IJ SOLN
INTRAMUSCULAR | Status: AC
  Filled 2013-08-22: qty 30

## 2013-08-22 MED ORDER — SODIUM CHLORIDE 0.9 % IV SOLN: INTRAVENOUS | Status: DC

## 2013-08-22 MED ORDER — ASPIRIN 81 MG PO CHEW
81.0000 mg | CHEWABLE_TABLET | ORAL | Status: AC
  Administered 2013-08-22: 81 mg via ORAL
  Filled 2013-08-22: qty 1

## 2013-08-22 MED ORDER — HEPARIN (PORCINE) IN NACL 2-0.9 UNIT/ML-% IJ SOLN
INTRAMUSCULAR | Status: AC
  Filled 2013-08-22: qty 1000

## 2013-08-22 MED ORDER — SODIUM CHLORIDE 0.9 % IJ SOLN: 3.0000 mL | INTRAMUSCULAR | Status: DC | PRN

## 2013-08-22 MED ORDER — NITROGLYCERIN 0.2 MG/ML ON CALL CATH LAB
INTRAVENOUS | Status: AC
  Filled 2013-08-22: qty 1

## 2013-08-22 MED ORDER — MIDAZOLAM HCL 2 MG/2ML IJ SOLN
INTRAMUSCULAR | Status: AC
  Filled 2013-08-22: qty 2

## 2013-08-22 MED ORDER — FENTANYL CITRATE 0.05 MG/ML IJ SOLN
INTRAMUSCULAR | Status: AC
  Filled 2013-08-22: qty 2

## 2013-08-22 MED ORDER — HEPARIN SODIUM (PORCINE) 1000 UNIT/ML IJ SOLN
INTRAMUSCULAR | Status: AC
  Filled 2013-08-22: qty 1

## 2013-08-22 MED ORDER — DIAZEPAM 5 MG PO TABS
5.0000 mg | ORAL_TABLET | ORAL | Status: AC
  Administered 2013-08-22: 5 mg via ORAL
  Filled 2013-08-22: qty 1

## 2013-08-22 MED ORDER — SODIUM CHLORIDE 0.9 % IV SOLN: 250.0000 mL | INTRAVENOUS | Status: DC | PRN

## 2013-08-22 MED ORDER — SODIUM CHLORIDE 0.9 % IJ SOLN
3.0000 mL | Freq: Two times a day (BID) | INTRAMUSCULAR | Status: DC
Start: 2013-08-22 — End: 2013-08-22

## 2013-08-22 NOTE — CV Procedure (Signed)
     Left Heart Catheterization with Coronary Angiography  Report  Rodney Adkins  58 y.o.  male Oct 06, 1955  Procedure Date: 08/22/2013  Referring Physician: Olin Pia, M.D. Primary Cardiologist: Olin Pia, M.D.  INDICATIONS: Exertional and rest left chest and arm pain worrisome for angina pectoris. The patient has known hypertrophic obstructive cardiomyopathy. This study is being done to rule out concomitant obstructive coronary artery disease.  PROCEDURE: 1. Left heart catheterization; 2. Coronary angiography; 3. Left ventriculography  CONSENT:  The risks, benefits, and details of the procedure were explained in detail to the patient. Risks including death, stroke, heart attack, kidney injury, allergy, limb ischemia, bleeding and radiation injury were discussed.  The patient verbalized understanding and wanted to proceed.  Informed written consent was obtained.  PROCEDURE TECHNIQUE:  After Xylocaine anesthesia a 5 French Slender sheath was placed in the right radial artery with a double wall needle stick using an Angiocath after multiple passes.  Coronary angiography was done using a 5 [place] JR 4 and JL 3.5 cm catheters.  Left ventriculography was done using the 5 [place] JR 4 catheter and hand injection.   No complications occurred during the procedure. Hemostasis was achieved with a wrist band. 10 cc of saline was applied to achieve hemostasis.   CONTRAST:  Total of 70 cc.  COMPLICATIONS:  None   HEMODYNAMICS:  Aortic pressure 91/56 mmHg; LV pressure 115/5 mmHg; LVEDP 17 mm mercury; LV outflow gradient 24 mm mercury  ANGIOGRAPHIC DATA:   The left main coronary artery is normal.  The left anterior descending artery is normal.  The left circumflex artery is normal.  The right coronary artery is dominant and normal.   LEFT VENTRICULOGRAM:  Left ventricular angiogram was done in the 30 RAO projection and revealed normal systolic contractility with an estimated ejection  fraction of 65%.  Near cavity obliteration is noted in the mid and apical LV.   IMPRESSIONS:  1. Normal coronary arteries  2. Normal left ventricular contractility with an estimated ejection fraction of 65%  3. 25 mmHg LV outflow track gradient   RECOMMENDATION:  Medical management of hypertrophic obstructive cardiomyopathy.

## 2013-08-22 NOTE — Interval H&P Note (Signed)
History and Physical Interval Note:  08/22/2013 8:27 AM The patient has exertional and rest left arm and chest tightness that lasts less than 30 minutes. 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 No ischemic evaluation has been performed now in several years. Catheterization is indicated to assess the anatomy and help rule out the possibility of ischemia due to obstructive coronary disease as a cause of the chest discomfort. He obviously could be having ischemia due to his hypertrophic cardiomyopathy. Also the complete heart block could be ischemically mediated. Angiography would help to define anatomy. Rodney Adkins  has presented today for surgery, with the diagnosis of Chest pain  The various methods of treatment have been discussed with the patient and family. After consideration of risks, benefits and other options for treatment, the patient has consented to  Procedure(s): LEFT HEART CATHETERIZATION WITH CORONARY ANGIOGRAM (N/A) as a surgical intervention .  The patient's history has been reviewed, patient examined, no change in status, stable for surgery.  I have reviewed the patient's chart and labs.  Questions were answered to the patient's satisfaction.   Risks of stroke, death, myocardial infarction, bleeding, limb ischemia, allergy, kidney injury, among others were discussed in detail with the patient and wife. The patient accepts these risks  [name]

## 2013-08-22 NOTE — Discharge Instructions (Signed)

## 2013-08-22 NOTE — H&P (View-Only) (Signed)
The patient was admitted for treadmill testing. He was diffusely pacing throughout. Heart rate was approximately 182 minutes of stage I and the test was terminated 1 minute into stage II a heart rate of 135. Device function was normal. We discussed beta blockers. However, he is in the midst of a flare of his MS and he is much better this week than last week. We will hold off on beta blocker therapy.  He also tells me however that he has been having exertional chest discomfort that radiates into his left arm. It resolved with rest. We discussed nuclear stress testing versus catheterization. We will plan to pursue catheterization. He will call as tomorrow to find a day and will plan to do a in 1-2 weeks as the symptoms are stable but in the context of his MS would like that of a to  fully resovle  Well developed and nourished in no acute distress HENT normal Neck supple with JVP-flat Clear Regular rate and rhythm, no murmurs or gallops Abd-soft with active BS No Clubbing cyanosis edema Skin-warm and dry A & Oriented  Grossly normal sensory and motor function  ECG  NSR with P-synchronous/ AV  pacing

## 2013-09-12 ENCOUNTER — Encounter: Payer: Self-pay | Admitting: Internal Medicine

## 2013-09-12 ENCOUNTER — Ambulatory Visit (INDEPENDENT_AMBULATORY_CARE_PROVIDER_SITE_OTHER): Payer: BC Managed Care – PPO | Admitting: Internal Medicine

## 2013-09-12 VITALS — BP 104/68 | HR 71 | Ht 70.0 in | Wt 149.0 lb

## 2013-09-12 DIAGNOSIS — I442 Atrioventricular block, complete: Secondary | ICD-10-CM

## 2013-09-12 DIAGNOSIS — R0602 Shortness of breath: Secondary | ICD-10-CM

## 2013-09-12 DIAGNOSIS — I421 Obstructive hypertrophic cardiomyopathy: Secondary | ICD-10-CM

## 2013-09-12 DIAGNOSIS — Z950 Presence of cardiac pacemaker: Secondary | ICD-10-CM

## 2013-09-12 LAB — MDC_IDC_ENUM_SESS_TYPE_INCLINIC
Battery Voltage: 2.79 V
Brady Statistic AP VP Percent: 20 %
Brady Statistic AS VP Percent: 80 %
Brady Statistic AS VS Percent: 0 %
Date Time Interrogation Session: 20150310143901
Lead Channel Impedance Value: 529 Ohm
Lead Channel Impedance Value: 580 Ohm
Lead Channel Pacing Threshold Amplitude: 0.5 V
Lead Channel Pacing Threshold Pulse Width: 0.4 ms
Lead Channel Setting Pacing Amplitude: 2.5 V
Lead Channel Setting Pacing Pulse Width: 0.4 ms
Lead Channel Setting Sensing Sensitivity: 5.6 mV
MDC IDC MSMT BATTERY IMPEDANCE: 555 Ohm
MDC IDC MSMT BATTERY REMAINING LONGEVITY: 78 mo
MDC IDC MSMT LEADCHNL RA SENSING INTR AMPL: 4 mV
MDC IDC MSMT LEADCHNL RV PACING THRESHOLD AMPLITUDE: 0.75 V
MDC IDC MSMT LEADCHNL RV PACING THRESHOLD PULSEWIDTH: 0.4 ms
MDC IDC SET LEADCHNL RA PACING AMPLITUDE: 2 V
MDC IDC STAT BRADY AP VS PERCENT: 0 %

## 2013-09-12 NOTE — Patient Instructions (Signed)
Your physician has recommended you make the following change in your medication:   Handwritten prescriptions given to you for Atenolol, Metoprolol, and Inderal LA ----  DO NOT TAKE THESE MEDICATIONS AT THE SAME TIME. ONLY TAKE ONE AT A TIME.   Your physician recommends that you continue on your current medications as directed. Please refer to the Current Medication list given to you today..ins Remote monitoring is used to monitor your Pacemaker of ICD from home. This monitoring reduces the number of office visits required to check your device to one time per year. It allows [place] to keep an eye on the functioning of your device to ensure it is working properly. You are scheduled for a device check from home on 12/14/13. You may send your transmission at any time that day. If you have a wireless device, the transmission will be sent automatically. After your physician reviews your transmission, you will receive a postcard with your next transmission date.  Your physician wants you to follow-up in: 1 year with Dr. [name]  You will receive a reminder letter in the mail two months in advance. If you don't receive a letter, please call our office to schedule the follow-up appointment.

## 2013-09-12 NOTE — Progress Notes (Signed)
skf      Patient Care Team: Vidal Schwalbe, MD as PCP - General (Family Medicine) Gatha Mayer, MD as Consulting Physician (Gastroenterology) [name], MD as Consulting Physician (Cardiology)   HPI  Rodney Adkins is a 58 y.o. male Seen in followup for HTN with complete heart block status post pacing. He has been seen again recently because of exercise intolerance previously had been associated with upper rate Wenckebach behavior. Because of episodes of chest pain he underwent catheterization demonstrating no obstructive coronary disease but there were gradients.LVEDP>>5  Gxt assoc with exaggerated HR response to low level exercise   He continues to struggle with exertional dyspnea and arm pain   He is deconditioned as well as struggling w some melancholy   Past Medical History  Diagnosis Date  . Multiple sclerosis 1998    Sxs started 1998 with L body numbness. MRI c/w MS. Started Avonex. Saw Dr [name] at [facility] and switched to Betaseron and was on for 5 yrs but developed depression and site rxns and D/C'd 2006. While on Betaseron, occassionally required solumedrol for flares.  . Complete heart block June 2009    a. Presented with hypotension/pulm edema 12/2007 when HOCM was also discovered. s/p dual chamber MDT pacemaker. Thallium stest 7/09 negative for infiltrative disease or ischemia.  Followed Dr [name]  Marland Kitchen HOCM (hypertrophic obstructive cardiomyopathy) 2009    Discovered by ECHO when he developed CHB. Mgmt Dr Caryl Comes  and Dr [name] at [facility]. Has outflow obstruction on ECHO 2011  . Right bundle branch block   . Wenckebach     a. Decreased ex tolerance 2010/2012 - underwent stress echo to look @ effects of MV/LVOT - nothing noted; although pt had high rate Wenckebach behavior with associated ex intol. Pacer reprogrammed with improvement in sx.  . SOB (shortness of breath)     a. PFTs 09/2010 essentially normal per pulm note (mild obst defect). b. CPST - showed Wenckebach a/w  exercise intolerance.  . Prostate enlargement     Past Surgical History  Procedure Laterality Date  . Leg tendon surgery  2008    Os peroneus resection and peroneal tendon repair  . Pacemaker insertion  12/2007    Complete heart block    Current Outpatient Prescriptions  Medication Sig Dispense Refill  . Ascorbic Acid (VITAMIN C) 500 MG tablet Take 500 mg by mouth daily.        Marland Kitchen aspirin 81 MG tablet Take 81 mg by mouth daily.      . cholecalciferol (VITAMIN D) 1000 UNITS tablet Take 1,000 Units by mouth daily.      . Multiple Vitamin (MULTIVITAMIN) tablet Take 1 tablet by mouth daily.        . TECFIDERA 120 MG CPDR        No current facility-administered medications for this visit.    No Known Allergies  Review of Systems negative except from HPI and PMH  Physical Exam BP 104/68  Pulse 71  Ht 5\' 10"  (1.778 m)  Wt 149 lb (67.586 kg)  BMI 21.38 kg/m2 Well developed and well nourished in no acute distress HENT normal E scleral and icterus clear Neck Supple JVP flat; carotids brisk and full Clear to ausculation  Regular rate and rhythm, 2/6 murmur  Soft with active bowel sounds No clubbing cyanosis none Edema Alert and oriented, grossly normal motor and sensory function Skin Warm and Dry     Assessment and  Plan  HCM  Complete Heart  Block  Pacer Medtronic  Dyspnea on exertion  Complex physiology likely contributing to his dyspnea. Is likely includes relative intravascular depletion as suggested by an LVEDP of only fine at the time of catheterization. Furthermore, exaggerated heart rate with exertion with further impair diastolic filling. He has had a history in the past of volume overload.  Hence we will take a judicious approach of increasing fluid intake and perhaps modestly sodium intake. In addition we'll begin him on beta blockers and I have given him prescriptions for atenolol 25, metoprolol tartrate 25, and Inderal LA 60. We will regroup in a few months  to see which of his beta blockers as tolerated best  Also encouraged him to pursue some greater degree of fitness perhaps in the context of cardiac rehabilitation

## 2013-09-19 ENCOUNTER — Encounter: Payer: Self-pay | Admitting: Internal Medicine

## 2013-12-14 ENCOUNTER — Ambulatory Visit (INDEPENDENT_AMBULATORY_CARE_PROVIDER_SITE_OTHER): Payer: BC Managed Care – PPO | Admitting: *Deleted

## 2013-12-14 DIAGNOSIS — I442 Atrioventricular block, complete: Secondary | ICD-10-CM

## 2013-12-14 NOTE — Progress Notes (Signed)
Remote pacemaker transmission.   

## 2013-12-26 LAB — MDC_IDC_ENUM_SESS_TYPE_REMOTE
Battery Impedance: 656 Ohm
Battery Remaining Longevity: 72 mo
Battery Voltage: 2.79 V
Brady Statistic AP VP Percent: 18 %
Brady Statistic AS VP Percent: 82 %
Lead Channel Impedance Value: 563 Ohm
Lead Channel Impedance Value: 576 Ohm
Lead Channel Sensing Intrinsic Amplitude: 2.8 mV
Lead Channel Setting Pacing Amplitude: 2 V
Lead Channel Setting Pacing Amplitude: 2.5 V
Lead Channel Setting Pacing Pulse Width: 0.4 ms
MDC IDC MSMT LEADCHNL RA PACING THRESHOLD AMPLITUDE: 0.5 V
MDC IDC MSMT LEADCHNL RA PACING THRESHOLD PULSEWIDTH: 0.4 ms
MDC IDC MSMT LEADCHNL RV PACING THRESHOLD AMPLITUDE: 1 V
MDC IDC MSMT LEADCHNL RV PACING THRESHOLD PULSEWIDTH: 0.4 ms
MDC IDC SESS DTM: 20150611130141
MDC IDC SET LEADCHNL RV SENSING SENSITIVITY: 5.6 mV
MDC IDC STAT BRADY AP VS PERCENT: 0 %
MDC IDC STAT BRADY AS VS PERCENT: 0 %

## 2014-01-09 ENCOUNTER — Encounter: Payer: Self-pay | Admitting: Cardiology

## 2014-01-16 ENCOUNTER — Encounter: Payer: Self-pay | Admitting: Internal Medicine

## 2014-03-19 ENCOUNTER — Telehealth: Payer: Self-pay | Admitting: Cardiology

## 2014-03-19 ENCOUNTER — Encounter: Payer: BC Managed Care – PPO | Admitting: *Deleted

## 2014-03-19 NOTE — Telephone Encounter (Signed)
LMOVM reminding pt to send remote transmission.   

## 2014-03-20 ENCOUNTER — Encounter: Payer: Self-pay | Admitting: Cardiology

## 2014-03-23 ENCOUNTER — Encounter: Payer: BC Managed Care – PPO | Admitting: *Deleted

## 2014-04-23 ENCOUNTER — Encounter: Payer: Self-pay | Admitting: *Deleted

## 2014-04-26 ENCOUNTER — Telehealth: Payer: Self-pay | Admitting: Internal Medicine

## 2014-04-26 NOTE — Telephone Encounter (Signed)
New message     Pt received a letter stating we did not get his remote check---he sent it in sometimes in sept.  Please call

## 2014-04-27 NOTE — Telephone Encounter (Signed)
LMOVM stating pt may disregard letter.

## 2014-06-14 ENCOUNTER — Encounter (HOSPITAL_COMMUNITY): Payer: Self-pay | Admitting: Interventional Cardiology

## 2014-09-28 ENCOUNTER — Encounter: Payer: Self-pay | Admitting: Internal Medicine

## 2014-09-28 ENCOUNTER — Ambulatory Visit (INDEPENDENT_AMBULATORY_CARE_PROVIDER_SITE_OTHER): Payer: BC Managed Care – PPO | Admitting: Internal Medicine

## 2014-09-28 VITALS — BP 110/64 | HR 59 | Ht 70.0 in | Wt 142.8 lb

## 2014-09-28 DIAGNOSIS — I442 Atrioventricular block, complete: Secondary | ICD-10-CM

## 2014-09-28 DIAGNOSIS — Z45018 Encounter for adjustment and management of other part of cardiac pacemaker: Secondary | ICD-10-CM

## 2014-09-28 DIAGNOSIS — I421 Obstructive hypertrophic cardiomyopathy: Secondary | ICD-10-CM

## 2014-09-28 NOTE — Progress Notes (Signed)
skf      Patient Care Team: Harlan Stains, MD as PCP - General (Family Medicine) Gatha Mayer, MD as Consulting Physician (Gastroenterology) [name], MD as Consulting Physician (Cardiology)   HPI  Rodney Adkins is a 59 y.o. male Seen in followup for HTN with complete heart block status post pacing. He has been seen again recently because of exercise intolerance previously had been associated with upper rate Wenckebach behavior. Because of episodes of chest pain he underwent catheterization demonstrating no obstructive coronary disease but there were gradients.LVEDP>>5  He continues to struggle with exertional dyspnea and arm pain   He is deconditioned as well as struggling w some melancholy   This can be somewhat unpredictable. There is been no edema.  It is also his wifes impression that sometimes even at rest tehre is a very amoutn of energy ( see bleow)   Past Medical History  Diagnosis Date  . Multiple sclerosis 1998    Sxs started 1998 with L body numbness. MRI c/w MS. Started Avonex. Saw Dr [name] at [facility] and switched to Betaseron and was on for 5 yrs but developed depression and site rxns and D/C'd 2006. While on Betaseron, occassionally required solumedrol for flares.  . Complete heart block June 2009    a. Presented with hypotension/pulm edema 12/2007 when HOCM was also discovered. s/p dual chamber MDT pacemaker. Thallium stest 7/09 negative for infiltrative disease or ischemia.  Followed Dr [name]  Marland Kitchen HOCM (hypertrophic obstructive cardiomyopathy) 2009    Discovered by ECHO when he developed CHB. Mgmt Dr Caryl Comes  and Dr [name] at [facility]. Has outflow obstruction on ECHO 2011  . Right bundle branch block   . Wenckebach     a. Decreased ex tolerance 2010/2012 - underwent stress echo to look @ effects of MV/LVOT - nothing noted; although pt had high rate Wenckebach behavior with associated ex intol. Pacer reprogrammed with improvement in sx.  . SOB (shortness of breath)     a. PFTs 09/2010 essentially normal per pulm note (mild obst defect). b. 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  . Prostate enlargement     Past Surgical History  Procedure Laterality Date  . Leg tendon surgery  2008    Os peroneus resection and peroneal tendon repair  . Pacemaker insertion  12/2007    Complete heart block  . Left heart catheterization with coronary angiogram N/A 08/22/2013    Procedure: LEFT HEART CATHETERIZATION WITH CORONARY ANGIOGRAM;  Surgeon: [name], MD;  Location[facility] CATH LAB;  Service: Cardiovascular;  Laterality: N/A;    Current Outpatient Prescriptions  Medication Sig Dispense Refill  . Ascorbic Acid (VITAMIN C) 500 MG tablet Take 500 mg by mouth daily.      Marland Kitchen aspirin 81 MG tablet Take 81 mg by mouth daily.    . cholecalciferol (VITAMIN D) 1000 UNITS tablet Take 1,000 Units by mouth daily.    . Multiple Vitamin (MULTIVITAMIN) tablet Take 1 tablet by mouth daily.      . TECFIDERA 120 MG CPDR Take 240 mg by mouth 2 (two) times daily.      No current facility-administered medications for this visit.    No Known Allergies  Review of Systems negative except from HPI and PMH  Physical Exam BP 110/64 mmHg  Pulse 59  Ht 5\' 10"  (1.778 m)  Wt 142 lb 12.8 oz (64.774 kg)  BMI 20.49 kg/m2 Well developed and well nourished in no acute distress HENT normal  E scleral and icterus clear Neck Supple JVP flat; carotids brisk and full Clear to ausculation Device pocket well healed; without hematoma or erythema.  There is no tethering   Regular rate and rhythm, 2/6 murmur enhances With Valsalva  Soft with active bowel sounds No clubbing cyanosis none Edema Alert and oriented, grossly normal motor and sensory function Skin Warm and Dry   ECG ordered today demonstrates sinus rhythm at 62 with a synchronous pacing  Assessment and  Plan  HCM  Complete Heart Block  Pacer Medtronic The patient's device was interrogated.  The information was  reviewed. No changes were made in the programming.     Dyspnea on exertion     Device interrogation demonstrates that about 25% of his heart beats are faster than 100 bpm. In the context of his HCM this may be problematic in terms of dyspnea. There is also striking that since the high percentage of his heart rates are faster than 100 as he is largely sedentary. This begs the question as to whether some of the symptoms of dyspnea with minimal exertion occur in the context of a more stressful sedentary situation.  He is going to get a heart rate monitor and try to identify a correlation between his dyspnea and heart rate.  We have also discussed the benefits of cardiovascular fitness and potential role of low-dose beta blockers. Will review this again in about 6 weeks  We spent more than 50% of our >25 min visit in face to face counseling regarding the above

## 2014-09-28 NOTE — Patient Instructions (Addendum)
Your physician recommends that you continue on your current medications as directed. Please refer to the Current Medication list given to you today.  Your physician recommends that you schedule a follow-up appointment in: 6-8 weeks with Dr. Klein.   

## 2014-10-04 LAB — MDC_IDC_ENUM_SESS_TYPE_INCLINIC
Battery Impedance: 913 Ohm
Battery Remaining Longevity: 60 mo
Battery Voltage: 2.79 V
Brady Statistic AP VP Percent: 17 %
Brady Statistic AS VP Percent: 83 %
Brady Statistic AS VS Percent: 0 %
Date Time Interrogation Session: 20160325130032
Lead Channel Pacing Threshold Amplitude: 0.5 V
Lead Channel Pacing Threshold Pulse Width: 0.4 ms
Lead Channel Pacing Threshold Pulse Width: 0.4 ms
Lead Channel Setting Pacing Amplitude: 2 V
Lead Channel Setting Pacing Amplitude: 2.5 V
Lead Channel Setting Pacing Pulse Width: 0.4 ms
Lead Channel Setting Sensing Sensitivity: 5.6 mV
MDC IDC MSMT LEADCHNL RA IMPEDANCE VALUE: 563 Ohm
MDC IDC MSMT LEADCHNL RA SENSING INTR AMPL: 4 mV
MDC IDC MSMT LEADCHNL RV IMPEDANCE VALUE: 554 Ohm
MDC IDC MSMT LEADCHNL RV PACING THRESHOLD AMPLITUDE: 1 V
MDC IDC STAT BRADY AP VS PERCENT: 0 %

## 2014-11-15 ENCOUNTER — Encounter: Payer: BC Managed Care – PPO | Admitting: Internal Medicine

## 2014-11-19 ENCOUNTER — Encounter: Payer: BC Managed Care – PPO | Admitting: Internal Medicine

## 2014-12-07 ENCOUNTER — Telehealth: Payer: Self-pay | Admitting: Internal Medicine

## 2014-12-07 NOTE — Telephone Encounter (Signed)
Patient c/o Palpitations:  High priority if patient c/o lightheadedness and shortness of breath.  1. How long have you been having palpitations? A week or less  2. Are you currently experiencing lightheadedness and shortness of breath? no  3. Have you checked your BP and heart rate? (document readings) no  4. Are you experiencing any other symptoms? Fluttering

## 2014-12-07 NOTE — Telephone Encounter (Signed)
Patient reports fluttering of the chest in the last week.  States this is new issue. He is not seeing an issue with his HR on the monitor he has at home. Patient will send transmission report and we will review with Dr. Caryl Comes next week. He is aware I will call him once reviewed with Dr. [name]

## 2014-12-12 ENCOUNTER — Encounter: Payer: Self-pay | Admitting: Internal Medicine

## 2014-12-12 NOTE — Telephone Encounter (Signed)
Call Documentation      Rodney Adkins Respus at 12/12/2014 3:52 PM     Status: Signed       Expand All Collapse All   Follow Up        Pt calling back to get information about previous message from 12/07/14. Please call back and advise.

## 2014-12-12 NOTE — Telephone Encounter (Signed)
This encounter was created in error - please disregard.

## 2014-12-12 NOTE — Telephone Encounter (Signed)
Follow Up        Pt calling back to get information about previous message from 12/07/14. Please call back and advise.

## 2014-12-12 NOTE — Telephone Encounter (Signed)
Informed patient that transmission showed 2 events of high atrial tach lasting 11 and 12 seconds. He states seems as though events keep occurring. He will send another transmission Sunday/Monday and we will review again.

## 2014-12-16 ENCOUNTER — Encounter: Payer: Self-pay | Admitting: Internal Medicine

## 2014-12-24 NOTE — Telephone Encounter (Signed)
Informed patient transmission showed no other events, according to device clinic. Denies any current issues. Patient verbalized understanding and will call back if symptoms reoccur or become problematic.

## 2015-02-07 ENCOUNTER — Ambulatory Visit (INDEPENDENT_AMBULATORY_CARE_PROVIDER_SITE_OTHER): Payer: BC Managed Care – PPO | Admitting: Emergency Medicine

## 2015-02-07 VITALS — BP 100/54 | HR 79 | Temp 98.1°F | Resp 18 | Ht 69.5 in | Wt 138.0 lb

## 2015-02-07 DIAGNOSIS — M542 Cervicalgia: Secondary | ICD-10-CM | POA: Diagnosis not present

## 2015-02-07 NOTE — Progress Notes (Signed)
Subjective:  Patient ID: Rodney Adkins, male    DOB: 1955/09/13  Age: 59 y.o. MRN: [id number]  CC: Neck Pain   HPI Rodney Adkins presents  with a complaint of migratory neck pain. He says he has a pain that's started out in the mid right neck laterally and has descended. He has no adenopathy or swelling or tender mass. He's had no erythema or fever chills runny nose or throat cough or ear pain or other complaints been no improvement with over-the-counter medication  History Rodney Adkins has a past medical history of Multiple sclerosis (1998); Complete heart block (June 2009); HOCM (hypertrophic obstructive cardiomyopathy) (2009); Right bundle branch block; Wenckebach; SOB (shortness of breath); Prostate enlargement; and Heart murmur.   He has past surgical history that includes Leg Tendon Surgery (2008); Pacemaker insertion (12/2007); left heart catheterization with coronary angiogram (N/A, 08/22/2013); and pace maker.   His  family history includes Colon polyps in his father; Diabetes in his paternal grandmother; Hypertension in his father. There is no history of Heart disease.  He   reports that he has never smoked. He has never used smokeless tobacco. He reports that he does not drink alcohol or use illicit drugs.  Outpatient Prescriptions Prior to Visit  Medication Sig Dispense Refill  . Ascorbic Acid (VITAMIN C) 500 MG tablet Take 500 mg by mouth daily.      . cholecalciferol (VITAMIN D) 1000 UNITS tablet Take 1,000 Units by mouth daily.    . Multiple Vitamin (MULTIVITAMIN) tablet Take 1 tablet by mouth daily.      . TECFIDERA 120 MG CPDR Take 240 mg by mouth 2 (two) times daily.     Marland Kitchen aspirin 81 MG tablet Take 81 mg by mouth daily.     No facility-administered medications prior to visit.    History   Social History  . Marital Status: Married    Spouse Name: N/A  . Number of Children: 0  . Years of Education: N/A   Occupational History  . professor [employer]     Social History Main Topics  . Smoking status: Never Smoker   . Smokeless tobacco: Never Used  . Alcohol Use: No  . Drug Use: No  . Sexual Activity: Not on file   Other Topics Concern  . 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     Review of Systems  Constitutional: Negative for fever, chills and appetite change.  HENT: Negative for congestion, ear pain, postnasal drip, sinus pressure and sore throat.   Eyes: Negative for pain and redness.  Respiratory: Negative for cough, shortness of breath and wheezing.   Cardiovascular: Negative for leg swelling.  Gastrointestinal: Negative for nausea, vomiting, abdominal pain, diarrhea, constipation and blood in stool.  Endocrine: Negative for polyuria.  Genitourinary: Negative for dysuria, urgency, frequency and flank pain.  Musculoskeletal: Negative for gait problem.  Skin: Negative for rash.  Neurological: Negative for weakness and headaches.  Psychiatric/Behavioral: Negative for confusion and decreased concentration. The patient is not nervous/anxious.     Objective:  BP 100/54 mmHg  Pulse 79  Temp(Src) 98.1 F (36.7 C) (Oral)  Resp 18  Ht 5' 9.5" (1.765 m)  Wt 138 lb (62.596 kg)  BMI 20.09 kg/m2  SpO2 99%  Physical Exam  Constitutional: He is oriented to person, place, and time. He appears well-developed and well-nourished.  HENT:  Head: Normocephalic and atraumatic.  Right Ear: External ear normal.  Left Ear: External ear normal.  Nose: Nose normal.  Mouth/Throat: Oropharynx is clear and moist.  Eyes: Conjunctivae are normal. Pupils are equal, round, and reactive to light.  Neck: Normal range of motion. Neck supple.  Cardiovascular: Normal rate.   Pulmonary/Chest: Effort normal and breath sounds normal.  Abdominal: Soft.  Musculoskeletal: He exhibits no edema.  Lymphadenopathy:    He has no cervical adenopathy.  Neurological: He is alert and oriented to person, place, and  time.  Skin: Skin is warm and dry.  Psychiatric: He has a normal mood and affect. His behavior is normal. Thought content normal.      Assessment & Plan:   There are no diagnoses linked to this encounter. I am having Rodney Adkins maintain his multivitamin, vitamin C, aspirin, cholecalciferol, and TECFIDERA.  No orders of the defined types were placed in this encounter.    Appropriate red flag conditions were discussed with the patient as well as actions that should be taken.  Patient expressed his understanding.  Follow-up: No Follow-up on file.  Roselee Culver, MD

## 2015-02-08 ENCOUNTER — Ambulatory Visit (INDEPENDENT_AMBULATORY_CARE_PROVIDER_SITE_OTHER): Payer: BC Managed Care – PPO | Admitting: Internal Medicine

## 2015-02-08 ENCOUNTER — Encounter: Payer: Self-pay | Admitting: Internal Medicine

## 2015-02-08 ENCOUNTER — Encounter: Payer: Self-pay | Admitting: *Deleted

## 2015-02-08 VITALS — BP 120/66 | HR 78 | Ht 69.5 in | Wt 137.6 lb

## 2015-02-08 DIAGNOSIS — I421 Obstructive hypertrophic cardiomyopathy: Secondary | ICD-10-CM | POA: Diagnosis not present

## 2015-02-08 DIAGNOSIS — Z45018 Encounter for adjustment and management of other part of cardiac pacemaker: Secondary | ICD-10-CM | POA: Diagnosis not present

## 2015-02-08 DIAGNOSIS — I442 Atrioventricular block, complete: Secondary | ICD-10-CM | POA: Diagnosis not present

## 2015-02-08 DIAGNOSIS — Z01812 Encounter for preprocedural laboratory examination: Secondary | ICD-10-CM | POA: Diagnosis not present

## 2015-02-08 LAB — CUP PACEART INCLINIC DEVICE CHECK
Battery Impedance: 1101 Ohm
Battery Remaining Longevity: 52 mo
Battery Voltage: 2.79 V
Brady Statistic AS VP Percent: 86 %
Brady Statistic AS VS Percent: 0 %
Date Time Interrogation Session: 20160805150126
Lead Channel Impedance Value: 3966 Ohm
Lead Channel Impedance Value: 546 Ohm
Lead Channel Pacing Threshold Amplitude: 1 V
Lead Channel Pacing Threshold Pulse Width: 1 ms
Lead Channel Sensing Intrinsic Amplitude: 5.6 mV
Lead Channel Setting Pacing Amplitude: 2.5 V
Lead Channel Setting Pacing Pulse Width: 0.4 ms
Lead Channel Setting Sensing Sensitivity: 5.6 mV
MDC IDC MSMT LEADCHNL RA PACING THRESHOLD AMPLITUDE: 3 V
MDC IDC MSMT LEADCHNL RV PACING THRESHOLD PULSEWIDTH: 0.4 ms
MDC IDC SET LEADCHNL RA PACING AMPLITUDE: 5 V
MDC IDC STAT BRADY AP VP PERCENT: 14 %
MDC IDC STAT BRADY AP VS PERCENT: 0 %

## 2015-02-08 LAB — BASIC METABOLIC PANEL
BUN: 16 mg/dL (ref 6–23)
CALCIUM: 9.7 mg/dL (ref 8.4–10.5)
CHLORIDE: 103 meq/L (ref 96–112)
CO2: 32 meq/L (ref 19–32)
Creatinine, Ser: 0.76 mg/dL (ref 0.40–1.50)
GFR: 111.11 mL/min (ref >60.00)
Glucose, Bld: 95 mg/dL (ref 70–99)
POTASSIUM: 4.2 meq/L (ref 3.5–5.1)
SODIUM: 140 meq/L (ref 135–145)

## 2015-02-08 LAB — CBC WITH DIFFERENTIAL/PLATELET
Basophils Absolute: 0 10*3/uL (ref 0.0–0.1)
Basophils Relative: 0.6 % (ref 0.0–3.0)
EOS ABS: 0.1 10*3/uL (ref 0.0–0.7)
EOS PCT: 1.7 % (ref 0.0–5.0)
HCT: 44.3 % (ref 39.0–52.0)
Hemoglobin: 15.2 g/dL (ref 13.0–17.0)
LYMPHS ABS: 1.3 10*3/uL (ref 0.7–4.0)
Lymphocytes Relative: 18.4 % (ref 12.0–46.0)
MCHC: 34.3 g/dL (ref 30.0–36.0)
MCV: 90.5 fl (ref 78.0–100.0)
MONO ABS: 0.7 10*3/uL (ref 0.1–1.0)
MONOS PCT: 10.1 % (ref 3.0–12.0)
NEUTROS ABS: 4.9 10*3/uL (ref 1.4–7.7)
Neutrophils Relative %: 69.2 % (ref 43.0–77.0)
Platelets: 231 10*3/uL (ref 150.0–400.0)
RBC: 4.9 Mil/uL (ref 4.22–5.81)
RDW: 13.3 % (ref 11.5–15.5)
WBC: 7.1 10*3/uL (ref 4.0–10.5)

## 2015-02-08 NOTE — Patient Instructions (Addendum)
Medication Instructions:  Your physician recommends that you continue on your current medications as directed. Please refer to the Current Medication list given to you today.  Labwork: Pre procedure labs today: BMET, CBCD  Testing/Procedures: Your physician has recommended that you have a venogram,  pacemaker lead revision and generator replacement.  Please see instruction sheet given to you today.   Follow-Up: Your wound check is scheduled for 02/21/15 at 9:30 a.m. at [address]    Any Other Special Instructions Will Be Listed Below (If Applicable). Thank you for choosing [facility]!!

## 2015-02-08 NOTE — Progress Notes (Signed)
skf      Patient Care Team: Harlan Stains, MD as PCP - General (Family Medicine) Gatha Mayer, MD as Consulting Physician (Gastroenterology) [name], MD as Consulting Physician (Cardiology)   HPI  Rodney Adkins is a 59 y.o. male Seen in followup for HTN with complete heart block status post pacing. He has been seen again recently because of exercise intolerance previously had been associated with upper rate Wenckebach behavior. Because of episodes of chest pain he underwent catheterization demonstrating no obstructive coronary disease but there were gradients.LVEDP>>5  He continues to struggle with exertional dyspnea and arm pain   there is some question as to whether rapid rates were contributing to this. He was to get a heart rate monitor.   He is having pain over his right jugular vein. He has marked with a pen. Began after he took aspirin for headache. He has also had some flutters dating back a month or 2.  It is also his wifes impression that sometimes even at rest tehre is a very amoutn of energy ( see bleow)   Past Medical History  Diagnosis Date  . Multiple sclerosis 1998    Sxs started 1998 with L body numbness. MRI c/w MS. Started Avonex. Saw Dr [name] at [facility] and switched to Betaseron and was on for 5 yrs but developed depression and site rxns and D/C'd 2006. While on Betaseron, occassionally required solumedrol for flares.  . Complete heart block June 2009    a. Presented with hypotension/pulm edema 12/2007 when HOCM was also discovered. s/p dual chamber MDT pacemaker. Thallium stest 7/09 negative for infiltrative disease or ischemia.  Followed Dr [name]  Marland Kitchen HOCM (hypertrophic obstructive cardiomyopathy) 2009    Discovered by ECHO when he developed CHB. Mgmt Dr Caryl Comes  and Dr [name] at [facility]. Has outflow obstruction on ECHO 2011  . Right bundle branch block   . Wenckebach     a. Decreased ex tolerance 2010/2012 - underwent stress echo to look @ effects of MV/LVOT -  nothing noted; although pt had high rate Wenckebach behavior with associated ex intol. Pacer reprogrammed with improvement in sx.  . SOB (shortness of breath)     a. PFTs 09/2010 essentially normal per pulm note (mild obst defect). b. 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  . Prostate enlargement   . Heart murmur     Past Surgical History  Procedure Laterality Date  . Leg tendon surgery  2008    Os peroneus resection and peroneal tendon repair  . Pacemaker insertion  12/2007    Complete heart block  . Left heart catheterization with coronary angiogram N/A 08/22/2013    Procedure: LEFT HEART CATHETERIZATION WITH CORONARY ANGIOGRAM;  Surgeon: [name], MD;  Location: [facility] CATH LAB;  Service: Cardiovascular;  Laterality: N/A;  . Pace maker      Current Outpatient Prescriptions  Medication Sig Dispense Refill  . Ascorbic Acid (VITAMIN C) 500 MG tablet Take 500 mg by mouth daily.      Marland Kitchen aspirin 81 MG tablet Take 81 mg by mouth daily.    . cholecalciferol (VITAMIN D) 1000 UNITS tablet Take 1,000 Units by mouth daily.    . Multiple Vitamin (MULTIVITAMIN) tablet Take 1 tablet by mouth daily.      . TECFIDERA 120 MG CPDR Take 240 mg by mouth 2 (two) times daily.      No current facility-administered medications for this visit.    No Known Allergies  Review of Systems negative except from HPI and PMH  Physical Exam BP 120/66 mmHg  Pulse 78  Ht 5' 9.5" (1.765 m)  Wt 137 lb 9.6 oz (62.415 kg)  BMI 20.04 kg/m2 Well developed and well nourished in no acute distress HENT normal E scleral and icterus clear Neck Supple Some tenderness of L IJ JVP flat; carotids brisk and full Clear to ausculation Device pocket well healed; without hematoma or erythema.  There is no tethering   Regular rate and rhythm, 2/6 murmur enhances With Valsalva  Soft with active bowel sounds No clubbing cyanosis none Edema Alert and oriented, grossly normal motor and sensory function Skin Warm  and Dry   ECG ordered today demonstrates sinus rhythm at 62 with a synchronous pacing  Assessment and  Plan  HCM  Complete Heart Block  Pacer Medtronic The patient's device was interrogated.  The information was reviewed. No changes were made in the programming.     Dyspnea on exertion  Atrial lead faiilure   The patient has atrial lead failure. This is been associated with oversensing palpitations. He is device dependent in the ventricle. The device is now more than 60 years old. He will need a venogram to determine whether the pain is patent. The event that it is patent we'll get a new lead with capping of the old lead and generator replacement. The event that the vein is occluded we will refer him   for lead extraction with Dr. Lovena Le.  The issue of pain in his neck is not clear to me. There is some tenderness over the internal jugular vein;  he has potentially this could be some phlebitis. We will start him on low-dose aspirin.

## 2015-02-12 ENCOUNTER — Ambulatory Visit (HOSPITAL_COMMUNITY)
Admission: RE | Admit: 2015-02-12 | Discharge: 2015-02-12 | Disposition: A | Discharge status: Home / Self Care | Payer: BC Managed Care – PPO | Source: Ambulatory Visit | Attending: Internal Medicine | Admitting: Internal Medicine

## 2015-02-12 ENCOUNTER — Encounter (HOSPITAL_COMMUNITY): Payer: Self-pay | Admitting: Internal Medicine

## 2015-02-12 ENCOUNTER — Encounter (HOSPITAL_COMMUNITY)
Admission: RE | Disposition: A | Discharge status: Home / Self Care | Payer: Self-pay | Source: Ambulatory Visit | Attending: Internal Medicine

## 2015-02-12 DIAGNOSIS — R011 Cardiac murmur, unspecified: Secondary | ICD-10-CM | POA: Diagnosis not present

## 2015-02-12 DIAGNOSIS — I421 Obstructive hypertrophic cardiomyopathy: Secondary | ICD-10-CM | POA: Diagnosis not present

## 2015-02-12 DIAGNOSIS — R002 Palpitations: Secondary | ICD-10-CM | POA: Diagnosis not present

## 2015-02-12 DIAGNOSIS — N400 Enlarged prostate without lower urinary tract symptoms: Secondary | ICD-10-CM | POA: Insufficient documentation

## 2015-02-12 DIAGNOSIS — Z7982 Long term (current) use of aspirin: Secondary | ICD-10-CM | POA: Diagnosis not present

## 2015-02-12 DIAGNOSIS — I10 Essential (primary) hypertension: Secondary | ICD-10-CM | POA: Diagnosis not present

## 2015-02-12 DIAGNOSIS — Z01812 Encounter for preprocedural laboratory examination: Secondary | ICD-10-CM

## 2015-02-12 DIAGNOSIS — Z45018 Encounter for adjustment and management of other part of cardiac pacemaker: Secondary | ICD-10-CM

## 2015-02-12 DIAGNOSIS — I871 Compression of vein: Secondary | ICD-10-CM | POA: Insufficient documentation

## 2015-02-12 DIAGNOSIS — I4510 Unspecified right bundle-branch block: Secondary | ICD-10-CM | POA: Insufficient documentation

## 2015-02-12 DIAGNOSIS — I442 Atrioventricular block, complete: Secondary | ICD-10-CM | POA: Diagnosis not present

## 2015-02-12 DIAGNOSIS — Z950 Presence of cardiac pacemaker: Secondary | ICD-10-CM | POA: Diagnosis not present

## 2015-02-12 DIAGNOSIS — T82110A Breakdown (mechanical) of cardiac electrode, initial encounter: Secondary | ICD-10-CM | POA: Diagnosis not present

## 2015-02-12 DIAGNOSIS — G35 Multiple sclerosis: Secondary | ICD-10-CM | POA: Insufficient documentation

## 2015-02-12 HISTORY — PX: PERIPHERAL VASCULAR CATHETERIZATION: SHX172C

## 2015-02-12 LAB — SURGICAL PCR SCREEN
MRSA, PCR: NEGATIVE
Staphylococcus aureus: NEGATIVE

## 2015-02-12 SURGERY — UPPER EXTREMITY VENOGRAPHY

## 2015-02-12 MED ORDER — IOHEXOL 350 MG/ML SOLN
INTRAVENOUS | Status: DC | PRN
  Administered 2015-02-12: 15 mL via INTRAVENOUS

## 2015-02-12 MED ORDER — SODIUM CHLORIDE 0.9 % IV SOLN: INTRAVENOUS | Status: DC

## 2015-02-12 MED ORDER — MUPIROCIN 2 % EX OINT
TOPICAL_OINTMENT | CUTANEOUS | Status: AC
  Administered 2015-02-12: 1
  Filled 2015-02-12: qty 22

## 2015-02-12 MED ORDER — FENTANYL CITRATE (PF) 100 MCG/2ML IJ SOLN
INTRAMUSCULAR | Status: AC
  Filled 2015-02-12: qty 4

## 2015-02-12 MED ORDER — SODIUM CHLORIDE 0.9 % IR SOLN
80.0000 mg | Status: AC
  Filled 2015-02-12: qty 2

## 2015-02-12 MED ORDER — CEFAZOLIN SODIUM-DEXTROSE 2-3 GM-% IV SOLR: 2.0000 g | INTRAVENOUS | Status: AC

## 2015-02-12 MED ORDER — MIDAZOLAM HCL 5 MG/5ML IJ SOLN
INTRAMUSCULAR | Status: AC
  Filled 2015-02-12: qty 25

## 2015-02-12 MED ORDER — CEFAZOLIN SODIUM-DEXTROSE 2-3 GM-% IV SOLR
INTRAVENOUS | Status: AC
  Filled 2015-02-12: qty 50

## 2015-02-12 SURGICAL SUPPLY — 14 items
CABLE SURGICAL S-101-97-12 (CABLE) IMPLANT
HEMOSTAT SURGICEL 2X4 FIBR (HEMOSTASIS) IMPLANT
KIT ESSENTIALS PG (KITS) IMPLANT
KIT PV (KITS) IMPLANT
PAD DEFIB LIFELINK (PAD) IMPLANT
SHEATH CLASSIC 7F (SHEATH) IMPLANT
SHEATH CLASSIC 8F (SHEATH) IMPLANT
SHEATH CLASSIC 9.5F (SHEATH) IMPLANT
SHEATH CLASSIC 9F (SHEATH) IMPLANT
SHIELD RADPAD SCOOP 12X17 (MISCELLANEOUS) IMPLANT
SYR MEDRAD MARK V 150ML (SYRINGE) IMPLANT
TRANSDUCER W/STOPCOCK (MISCELLANEOUS) IMPLANT
TRAY PACEMAKER INSERTION (CUSTOM PROCEDURE TRAY) IMPLANT
TRAY PV CATH (CUSTOM PROCEDURE TRAY) IMPLANT

## 2015-02-12 NOTE — H&P (View-Only) (Signed)
skf      Patient Care Team: Harlan Stains, MD as PCP - General (Family Medicine) Gatha Mayer, MD as Consulting Physician (Gastroenterology) [name], MD as Consulting Physician (Cardiology)   HPI  Rodney Adkins is a 59 y.o. male Seen in followup for HTN with complete heart block status post pacing. He has been seen again recently because of exercise intolerance previously had been associated with upper rate Wenckebach behavior. Because of episodes of chest pain he underwent catheterization demonstrating no obstructive coronary disease but there were gradients.LVEDP>>5  He continues to struggle with exertional dyspnea and arm pain   there is some question as to whether rapid rates were contributing to this. He was to get a heart rate monitor.   He is having pain over his right jugular vein. He has marked with a pen. Began after he took aspirin for headache. He has also had some flutters dating back a month or 2.  It is also his wifes impression that sometimes even at rest tehre is a very amoutn of energy ( see bleow)   Past Medical History  Diagnosis Date  . Multiple sclerosis 1998    Sxs started 1998 with L body numbness. MRI c/w MS. Started Avonex. Saw Dr [name] at [facility] and switched to Betaseron and was on for 5 yrs but developed depression and site rxns and D/C'd 2006. While on Betaseron, occassionally required solumedrol for flares.  . Complete heart block June 2009    a. Presented with hypotension/pulm edema 12/2007 when HOCM was also discovered. s/p dual chamber MDT pacemaker. Thallium stest 7/09 negative for infiltrative disease or ischemia.  Followed Dr [name]  Marland Kitchen HOCM (hypertrophic obstructive cardiomyopathy) 2009    Discovered by ECHO when he developed CHB. Mgmt Dr Caryl Comes  and Dr [name] at [facility] Has outflow obstruction on ECHO 2011  . Right bundle branch block   . Wenckebach     a. Decreased ex tolerance 2010/2012 - underwent stress echo to look @ effects of MV/LVOT -  nothing noted; although pt had high rate Wenckebach behavior with associated ex intol. Pacer reprogrammed with improvement in sx.  . SOB (shortness of breath)     a. PFTs 09/2010 essentially normal per pulm note (mild obst defect). b. 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  . Prostate enlargement   . Heart murmur     Past Surgical History  Procedure Laterality Date  . Leg tendon surgery  2008    Os peroneus resection and peroneal tendon repair  . Pacemaker insertion  12/2007    Complete heart block  . Left heart catheterization with coronary angiogram N/A 08/22/2013    Procedure: LEFT HEART CATHETERIZATION WITH CORONARY ANGIOGRAM;  Surgeon: [name], MD;  Location: [facility] CATH LAB;  Service: Cardiovascular;  Laterality: N/A;  . Pace maker      Current Outpatient Prescriptions  Medication Sig Dispense Refill  . Ascorbic Acid (VITAMIN C) 500 MG tablet Take 500 mg by mouth daily.      Marland Kitchen aspirin 81 MG tablet Take 81 mg by mouth daily.    . cholecalciferol (VITAMIN D) 1000 UNITS tablet Take 1,000 Units by mouth daily.    . Multiple Vitamin (MULTIVITAMIN) tablet Take 1 tablet by mouth daily.      . TECFIDERA 120 MG CPDR Take 240 mg by mouth 2 (two) times daily.      No current facility-administered medications for this visit.    No Known Allergies  Review of Systems negative except from HPI and PMH  Physical Exam BP 120/66 mmHg  Pulse 78  Ht 5' 9.5" (1.765 m)  Wt 137 lb 9.6 oz (62.415 kg)  BMI 20.04 kg/m2 Well developed and well nourished in no acute distress HENT normal E scleral and icterus clear Neck Supple Some tenderness of L IJ JVP flat; carotids brisk and full Clear to ausculation Device pocket well healed; without hematoma or erythema.  There is no tethering   Regular rate and rhythm, 2/6 murmur enhances With Valsalva  Soft with active bowel sounds No clubbing cyanosis none Edema Alert and oriented, grossly normal motor and sensory function Skin Warm  and Dry   ECG ordered today demonstrates sinus rhythm at 62 with a synchronous pacing  Assessment and  Plan  HCM  Complete Heart Block  Pacer Medtronic The patient's device was interrogated.  The information was reviewed. No changes were made in the programming.     Dyspnea on exertion  Atrial lead faiilure   The patient has atrial lead failure. This is been associated with oversensing palpitations. He is device dependent in the ventricle. The device is now more than 60 years old. He will need a venogram to determine whether the pain is patent. The event that it is patent we'll get a new lead with capping of the old lead and generator replacement. The event that the vein is occluded we will refer him   for lead extraction with Dr. Lovena Le.  The issue of pain in his neck is not clear to me. There is some tenderness over the internal jugular vein;  he has potentially this could be some phlebitis. We will start him on low-dose aspirin.

## 2015-02-12 NOTE — Interval H&P Note (Signed)
History and Physical Interval Note:  02/12/2015 12:20 PM  Rodney Adkins  has presented today for surgery, with the diagnosis of atrial lead failure  The various methods of treatment have been discussed with the patient and family. After consideration of risks, benefits and other options for treatment, the patient has consented to  Procedure(s): Venogram (N/A) as a surgical intervention .  The patient's history has been reviewed, patient examined, no change in status, stable for surgery.  I have reviewed the patient's chart and labs.  Questions were answered to the patient's satisfaction.     Virl Axe

## 2015-02-12 NOTE — Progress Notes (Signed)
Patient self dressed and left

## 2015-02-12 NOTE — Interval H&P Note (Signed)
History and Physical Interval Note:  02/12/2015 10:57 AM  Rodney Adkins  has presented today for surgery, with the diagnosis of atrial lead failure  The various methods of treatment have been discussed with the patient and family. After consideration of risks, benefits and other options for treatment, the patient has consented to  Procedure(s): Venogram (N/A) as a surgical intervention .  The patient's history has been reviewed, patient examined, no change in status, stable for surgery.  I have reviewed the patient's chart and labs.  Questions were answered to the patient's satisfaction.     Virl Axe  lenghy discussion >30 min outlining the issue of MRI and the preclusion of abandoned leads.  With his MS, he thinks he would like that option so we will do venogram and athen have him meet with GT to discuss re extraction

## 2015-02-12 NOTE — Progress Notes (Signed)
Discharge instructions reviewed w/patient; copy handed to him. Waiting on d/c orders from Dr. [name] Wife going to get his cloths.

## 2015-02-12 NOTE — Discharge Instructions (Signed)
Venogram, Care After °Refer to this sheet in the next few weeks. These instructions provide you with information on caring for yourself after your procedure. Your health care provider may also give you more specific instructions. Your treatment has been planned according to current medical practices, but problems sometimes occur. Call your health care provider if you have any problems or questions after your procedure. °WHAT TO EXPECT AFTER THE PROCEDURE °After your procedure, it is typical to have the following sensations: °· Mild discomfort at the catheter insertion site. °HOME CARE INSTRUCTIONS  °· Take all medicines exactly as directed. °· Follow any prescribed diet. °· Follow instructions regarding both rest and physical activity. °· Drink more fluids for the first several days after the procedure in order to help flush dye from your kidneys. °SEEK MEDICAL CARE IF: °· You develop a rash. °· You have fever not controlled by medicine. °SEEK IMMEDIATE MEDICAL CARE IF: °· There is pain, drainage, bleeding, redness, swelling, warmth or a red streak at the site of the IV tube. °· The extremity where your IV tube was placed becomes discolored, numb, or cool. °· You have difficulty breathing or shortness of breath. °· You develop chest pain. °· You have excessive dizziness or fainting. °Document Released: 04/12/2013 Document Revised: 06/27/2013 Document Reviewed: 04/12/2013 °ExitCare® Patient Information ©2015 [employer]. This information is not intended to replace advice given to you by your health care provider. Make sure you discuss any questions you have with your health care provider. ° °

## 2015-02-14 ENCOUNTER — Telehealth: Payer: Self-pay | Admitting: Internal Medicine

## 2015-02-14 NOTE — Telephone Encounter (Signed)
Informed patient that Dr. Caryl Comes would not be in the office the day. But assured patient that Dr. Lovena Le would be aware of recent venogram findings (which was one of patient's concerns) and collaborate with Dr. Caryl Comes about his plan of care. Patient verbalized understanding.

## 2015-02-14 NOTE — Telephone Encounter (Signed)
° ° ° ° °  Pt returning your call Please call by 4:45 if possible

## 2015-02-14 NOTE — Telephone Encounter (Signed)
New message     Pt is scheduled to have a procedure  Pt has an appointment with Dr Lovena Le on 8-31 @ 4pm and pt is wanting to know if Dr Caryl Comes could come in during his appt with Dr. Lovena Le  Please call to discuss

## 2015-02-21 ENCOUNTER — Ambulatory Visit: Payer: BC Managed Care – PPO

## 2015-03-06 ENCOUNTER — Encounter: Payer: Self-pay | Admitting: Internal Medicine

## 2015-03-06 ENCOUNTER — Ambulatory Visit (INDEPENDENT_AMBULATORY_CARE_PROVIDER_SITE_OTHER): Payer: BC Managed Care – PPO | Admitting: Internal Medicine

## 2015-03-06 VITALS — BP 102/64 | HR 94 | Ht 70.0 in | Wt 134.8 lb

## 2015-03-06 DIAGNOSIS — Z950 Presence of cardiac pacemaker: Secondary | ICD-10-CM

## 2015-03-06 NOTE — Progress Notes (Signed)
HPI Rodney Adkins is referred by Rodney. Rodney Adkins for consideration for PM lead extraction. He is a pleasant 59 yo man with CHB, who is s/p PPM insertion. He is active in yoga. He was found to have a broken atrial lead and underwent a venogram to assess his subclavian vein patency. 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  No Known Allergies   Current Outpatient Prescriptions  Medication Sig Dispense Refill  . Ascorbic Acid (VITAMIN C) 500 MG tablet Take 500 mg by mouth daily.      . cholecalciferol (VITAMIN D) 1000 UNITS tablet Take 1,000 Units by mouth daily.    . Dimethyl Fumarate (TECFIDERA) 240 MG CPDR Take 240 mg by mouth 2 (two) times daily.    . Multiple Vitamin (MULTIVITAMIN) tablet Take 1 tablet by mouth daily.       No current facility-administered medications for this visit.     Past Medical History  Diagnosis Date  . Multiple sclerosis 1998    Sxs started 1998 with L body numbness. MRI c/w MS. Started Avonex. Saw Rodney Adkins at [facility] and switched to Betaseron and was on for 5 yrs but developed depression and site rxns and D/C'd 2006. While on Betaseron, occassionally required solumedrol for flares.  . Complete heart block June 2009    a. Presented with hypotension/pulm edema 12/2007 when HOCM was also discovered. s/p dual chamber MDT pacemaker. Thallium stest 7/09 negative for infiltrative disease or ischemia.  Followed Rodney Adkins.  Marland Kitchen HOCM (hypertrophic obstructive cardiomyopathy) 2009    Discovered by ECHO when he developed CHB. Mgmt Rodney Adkins  and Rodney Adkins at [facility]. Has outflow obstruction on ECHO 2011  . Right bundle branch block   . Wenckebach     a. Decreased ex tolerance 2010/2012 - underwent stress echo to look @ effects of MV/LVOT - nothing noted; although pt had high rate Wenckebach behavior with associated ex intol. Pacer  reprogrammed with improvement in sx.  . SOB (shortness of breath)     a. PFTs 09/2010 essentially normal per pulm note (mild obst defect). b. 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  . Prostate enlargement   . Heart murmur     ROS:   All systems reviewed and negative except as noted in the HPI.   Past Surgical History  Procedure Laterality Date  . Leg tendon surgery  2008    Os peroneus resection and peroneal tendon repair  . Pacemaker insertion  12/2007    Complete heart block  . Left heart catheterization with coronary angiogram N/A 08/22/2013    Procedure: LEFT HEART CATHETERIZATION WITH CORONARY ANGIOGRAM;  Surgeon: Rodney Grooms, MD;  Location: [facility] CATH LAB;  Service: Cardiovascular;  Laterality: N/A;  . Pace maker    . Peripheral vascular catheterization N/A 02/12/2015    Procedure: Venogram;  Surgeon: Rodney Sprang, MD;  Location: [place] CV LAB;  Service: Cardiovascular;  Laterality: N/A;     Family History  Problem Relation Age of Onset  . Heart disease Neg Hx   . Colon polyps Father   . Hypertension Father   . Diabetes Paternal Grandmother      Social History   Social History  . Marital Status: Married    Spouse Name: N/A  . Number of Children: 0  .  Years of Education: N/A   Occupational History  . professor [employer]   Social History Main Topics  . Smoking status: Never Smoker   . Smokeless tobacco: Never Used  . Alcohol Use: No  . Drug Use: No  . Sexual Activity: Not on file   Other Topics Concern  . 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     BP 102/64 mmHg  Pulse 94  Ht 5\' 10"  (1.778 m)  Wt 134 lb 12.8 oz (61.145 kg)  BMI 19.34 kg/m2  Physical Exam:  Well appearing 59 yo man, NAD HEENT: Unremarkable Neck:  6 cm JVD, no thyromegally Lymphatics:  No adenopathy Back:  No CVA tenderness Lungs:  Clear with no wheezes HEART:  Regular rate rhythm, no murmurs, no rubs,  no clicks Abd:  soft, positive bowel sounds, no organomegally, no rebound, no guarding Ext:  2 plus pulses, no edema, no cyanosis, no clubbing Skin:  No rashes no nodules Neuro:  CN II through XII intact, motor grossly intact  DEVICE  Normal device function.  See PaceArt for details.   Assess/Plan:

## 2015-03-06 NOTE — Assessment & Plan Note (Addendum)
His medtronic device has a broken atrial lead which has been well documented. I have discussed the treatment options with the patient and his wife. The risks/benefits/goals/expectations of atrial lead extraction and insertion of a new atrial lead were reviewed vs having Dr. Caryl Comes place a new atrial lead. He is reflecting and will call us if he would like to proceed with atrial lead extraction and insertion of a new device.  I spent over an hour working on this patient with over 30 minutes spent formulating a plan with the patient face to face.

## 2015-03-06 NOTE — Patient Instructions (Signed)
Medication Instructions: - no changes  Labwork: - none  Procedures/Testing: - none  Follow-Up: - Call Janan Halter, RN [phone] when ready to schedule your procedure.  Any Additional Special Instructions Will Be Listed Below (If Applicable).

## 2015-03-07 ENCOUNTER — Encounter: Payer: Self-pay | Admitting: Internal Medicine

## 2015-03-07 ENCOUNTER — Ambulatory Visit (INDEPENDENT_AMBULATORY_CARE_PROVIDER_SITE_OTHER): Payer: BC Managed Care – PPO | Admitting: Internal Medicine

## 2015-03-07 VITALS — BP 119/60 | HR 75 | Ht 70.0 in | Wt 136.4 lb

## 2015-03-07 DIAGNOSIS — I442 Atrioventricular block, complete: Secondary | ICD-10-CM | POA: Diagnosis not present

## 2015-03-07 NOTE — Progress Notes (Signed)
Mostly inferior skf      Patient Care Team: Harlan Stains, MD as PCP - General (Family Medicine) Gatha Mayer, MD as Consulting Physician (Gastroenterology) [name], MD as Consulting Physician (Cardiology)   HPI  Rodney Adkins is a 59 y.o. male Seen in followup for HTN with complete heart block status post pacing. He has been seen again recently because of exercise intolerance previously had been associated with upper rate Wenckebach behavior. Because of episodes of chest pain he underwent catheterization demonstrating no obstructive coronary disease but there were gradients.LVEDP>>5  He continues to struggle with exertional dyspnea and arm pain   there is some question as to whether rapid rates were contributing to this. He was to get a heart rate monitor.   He is having pain over his right jugular vein. He has marked with a pen. Began after he took aspirin for headache. He has also had some flutters dating back a month or 2.  It is also his wifes impression that sometimes even at rest tehre is a very amoutn of energy ( see bleow)   Past Medical History  Diagnosis Date  . Multiple sclerosis 1998    Sxs started 1998 with L body numbness. MRI c/w MS. Started Avonex. Saw Dr [name] at [facility] and switched to Betaseron and was on for 5 yrs but developed depression and site rxns and D/C'd 2006. While on Betaseron, occassionally required solumedrol for flares.  . Complete heart block June 2009    a. Presented with hypotension/pulm edema 12/2007 when HOCM was also discovered. s/p dual chamber MDT pacemaker. Thallium stest 7/09 negative for infiltrative disease or ischemia.  Followed Dr [name]  Marland Kitchen HOCM (hypertrophic obstructive cardiomyopathy) 2009    Discovered by ECHO when he developed CHB. Mgmt Dr Caryl Comes  and Dr [name] at [facility]. Has outflow obstruction on ECHO 2011  . Right bundle branch block   . Wenckebach     a. Decreased ex tolerance 2010/2012 - underwent stress echo to look @  effects of MV/LVOT - nothing noted; although pt had high rate Wenckebach behavior with associated ex intol. Pacer reprogrammed with improvement in sx.  . SOB (shortness of breath)     a. PFTs 09/2010 essentially normal per pulm note (mild obst defect). b. 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  . Prostate enlargement   . Heart murmur     Past Surgical History  Procedure Laterality Date  . Leg tendon surgery  2008    Os peroneus resection and peroneal tendon repair  . Pacemaker insertion  12/2007    Complete heart block  . Left heart catheterization with coronary angiogram N/A 08/22/2013    Procedure: LEFT HEART CATHETERIZATION WITH CORONARY ANGIOGRAM;  Surgeon: [name], MD;  Location: [facility] CATH LAB;  Service: Cardiovascular;  Laterality: N/A;  . Pace maker    . Peripheral vascular catheterization N/A 02/12/2015    Procedure: Venogram;  Surgeon: [name], MD;  Location: [place] CV LAB;  Service: Cardiovascular;  Laterality: N/A;    Current Outpatient Prescriptions  Medication Sig Dispense Refill  . Ascorbic Acid (VITAMIN C) 500 MG tablet Take 500 mg by mouth daily as needed (Vitamin c supplement).     . cholecalciferol (VITAMIN D) 1000 UNITS tablet Take 1,000 Units by mouth daily.    . Dimethyl Fumarate (TECFIDERA) 240 MG CPDR Take 240 mg by mouth 2 (two) times daily.    . Multiple Vitamin (MULTIVITAMIN) tablet Take 1 tablet  by mouth daily as needed (Vitamin supplement).      No current facility-administered medications for this visit.    No Known Allergies  Review of Systems negative except from HPI and PMH  Physical Exam BP 119/60 mmHg  Pulse 75  Ht 5' 10"  (1.778 m)  Wt 136 lb 6.4 oz (61.871 kg)  BMI 19.57 kg/m2 Well developed and well nourished in no acute distress HENT normal E scleral and icterus clear Neck Supple Some tenderness of L IJ JVP flat; carotids brisk and full Clear to ausculation Device pocket well healed; without hematoma or  erythema.  There is no tethering   Regular rate and rhythm, 2/6 murmur enhances With Valsalva  Soft with active bowel sounds No clubbing cyanosis none Edema Alert and oriented, grossly normal motor and sensory function Skin Warm and Dry   ECG ordered today demonstrates sinus rhythm at 62 with a synchronous pacing  Assessment and  Plan  HCM  Complete Heart Block  Pacer Medtronic The patient's device was interrogated.  The information was reviewed. No changes were made in the programming.     Dyspnea on exertion  Atrial lead faiilure  We had lengthy discussions regarding lead replacement versus the addition of a new lead.  I met with Dr. Lovena Le. He would like further impression. We will try to arrange for him to get seen in [place].  We spent more than 50% of our >25 min visit in face to face counseling regarding the above

## 2015-03-19 ENCOUNTER — Telehealth: Payer: Self-pay | Admitting: Internal Medicine

## 2015-03-19 NOTE — Telephone Encounter (Signed)
Dr. Caryl Comes spoke with the cardiologist in [place] and they are to call the patient tomorrow for an appointment. He also spoke with the patient's wife and answered her questions.

## 2015-03-19 NOTE — Telephone Encounter (Signed)
NewMessage   Pt wife calling to speak w/ Dr Caryl Comes or RN concerning surgical procedures. Please call back and discuss.

## 2015-03-19 NOTE — Telephone Encounter (Signed)
I spoke with the patient's wife. No call received from [place]. She did have some questions that she wanted to ask Dr. Caryl Comes that she did not think about earlier. I advised I would let him

## 2015-04-19 ENCOUNTER — Telehealth: Payer: Self-pay | Admitting: Internal Medicine

## 2015-04-19 ENCOUNTER — Encounter: Payer: Self-pay | Admitting: *Deleted

## 2015-04-19 NOTE — Telephone Encounter (Signed)
Dr. Caryl Comes received a follow up note from Dr. Neila Gear at [facility] EP clinic [facility]). He asked that I call the patient to see what he has decided concerning his lead as he did not want to lose the patient to follow up. I have left a message for the patient to call.

## 2015-04-19 NOTE — Telephone Encounter (Signed)
I spoke with the patient.  He states that he has several questions for Dr. Caryl Comes and asked if these could be emailed. I inquired if he is on MyChart and is currently is not. I advised him I will send him an activation code so that he can get this set up and then send his questions in. He is agreeable. Mailing address confirmed.

## 2015-05-28 ENCOUNTER — Telehealth: Payer: Self-pay | Admitting: Internal Medicine

## 2015-05-28 NOTE — Telephone Encounter (Signed)
New Message    Pt calling to schedule procedure. Please call back and advise.

## 2015-05-29 NOTE — Telephone Encounter (Signed)
Will need to see Dr Lovena Le again prior to scheduling procedure.  Lenna Sciara is calling patient to schedule and ask him to bring records from MD visit is [place]

## 2015-06-06 ENCOUNTER — Encounter: Payer: Self-pay | Admitting: Internal Medicine

## 2015-06-06 ENCOUNTER — Ambulatory Visit (INDEPENDENT_AMBULATORY_CARE_PROVIDER_SITE_OTHER): Payer: BC Managed Care – PPO | Admitting: Internal Medicine

## 2015-06-06 VITALS — BP 108/68 | HR 74 | Ht 70.0 in | Wt 136.4 lb

## 2015-06-06 DIAGNOSIS — T82190D Other mechanical complication of cardiac electrode, subsequent encounter: Secondary | ICD-10-CM

## 2015-06-06 DIAGNOSIS — I493 Ventricular premature depolarization: Secondary | ICD-10-CM | POA: Diagnosis not present

## 2015-06-06 DIAGNOSIS — T82110D Breakdown (mechanical) of cardiac electrode, subsequent encounter: Secondary | ICD-10-CM

## 2015-06-06 NOTE — Progress Notes (Signed)
HPI Rodney Adkins returns today for followup. He is a pleasant 59 yo man with complete heart block who was found to have a broken atrial lead and has been seen in the past regarding lead extraction. He has gotten a second opinion in [place] by Dr. Edison Nasuti. He returns today for followup. In the interim, he feels well. He has multiple questions regarding the technical details of his procedure.  No Known Allergies   Current Outpatient Prescriptions  Medication Sig Dispense Refill  . Ascorbic Acid (VITAMIN C) 500 MG tablet Take 500 mg by mouth daily as needed (Vitamin c supplement).     . cholecalciferol (VITAMIN D) 1000 UNITS tablet Take 1,000 Units by mouth daily.    . Dimethyl Fumarate (TECFIDERA) 240 MG CPDR Take 240 mg by mouth 2 (two) times daily.    . Multiple Vitamin (MULTIVITAMIN) tablet Take 1 tablet by mouth daily as needed (Vitamin supplement).      No current facility-administered medications for this visit.     Past Medical History  Diagnosis Date  . Multiple sclerosis ([place]) 1998    Sxs started 1998 with L body numbness. MRI c/w MS. Started Avonex. Saw Dr [name] at [facility] and switched to Betaseron and was on for 5 yrs but developed depression and site rxns and D/C'd 2006. While on Betaseron, occassionally required solumedrol for flares.  . Complete heart block [facility]) June 2009    a. Presented with hypotension/pulm edema 12/2007 when HOCM was also discovered. s/p dual chamber MDT pacemaker. Thallium stest 7/09 negative for infiltrative disease or ischemia.  Followed Dr [name]  Marland Kitchen HOCM (hypertrophic obstructive cardiomyopathy) ([place]) 2009    Discovered by ECHO when he developed CHB. Mgmt Dr Caryl Comes  and Dr [name] at [facility]. Has outflow obstruction on ECHO 2011  . Right bundle branch block   . Wenckebach     a. Decreased ex tolerance 2010/2012 - underwent stress echo to look @ effects of MV/LVOT - nothing noted; although pt had high rate Wenckebach behavior with associated ex  intol. Pacer reprogrammed with improvement in sx.  . SOB (shortness of breath)     a. PFTs 09/2010 essentially normal per pulm note (mild obst defect). b. 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  . Prostate enlargement   . Heart murmur     ROS:   All systems reviewed and negative except as noted in the HPI.   Past Surgical History  Procedure Laterality Date  . Leg tendon surgery  2008    Os peroneus resection and peroneal tendon repair  . Pacemaker insertion  12/2007    Complete heart block  . Left heart catheterization with coronary angiogram N/A 08/22/2013    Procedure: LEFT HEART CATHETERIZATION WITH CORONARY ANGIOGRAM;  Surgeon: [name], MD;  Location: [facility] CATH LAB;  Service: Cardiovascular;  Laterality: N/A;  . Pace maker    . Peripheral vascular catheterization N/A 02/12/2015    Procedure: Venogram;  Surgeon: [name], MD;  Location: [place] CV LAB;  Service: Cardiovascular;  Laterality: N/A;     Family History  Problem Relation Age of Onset  . Heart disease Neg Hx   . Colon polyps Father   . Hypertension Father   . Diabetes Paternal Grandmother      Social History   Social History  . Marital Status: Married    Spouse Name: N/A  . Number of Children: 0  . Years of Education: N/A   Occupational  History  . professor [employer]   Social History Main Topics  . Smoking status: Never Smoker   . Smokeless tobacco: Never Used  . Alcohol Use: No  . Drug Use: No  . Sexual Activity: Not on file   Other Topics Concern  . 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     BP 108/68 mmHg  Pulse 74  Ht 5\' 10"  (1.778 m)  Wt 136 lb 6.4 oz (61.871 kg)  BMI 19.57 kg/m2  Physical Exam:  Well appearing NAD HEENT: Unremarkable Neck:  No JVD, no thyromegally Lymphatics:  No adenopathy Back:  No CVA tenderness Lungs:  Clear with no wheezes. Skin over the left chest has evidence of enlarged  veins. HEART:  Regular rate rhythm, no murmurs, no rubs, no clicks Abd:  soft, positive bowel sounds, no organomegally, no rebound, no guarding Ext:  2 plus pulses, no edema, no cyanosis, no clubbing Skin:  No rashes no nodules Neuro:  CN II through XII intact, motor grossly intact   Assess/Plan:

## 2015-06-06 NOTE — Patient Instructions (Signed)
Medication Instructions: - no changes  Labwork: - pending confirmation of procedure date  Procedures/Testing: - Your physician has recommended that you have a Lead extraction with placement of a new atrial lead/ Pacemaker.- Claiborne Billings (Dr. Tanna Furry) nurse will call you to confirm.  Follow-Up: - pending confirmation of procedure date  Any Additional Special Instructions Will Be Listed Below (If Applicable).

## 2015-06-06 NOTE — Assessment & Plan Note (Signed)
He appears to be asymptomatic. Will follow.

## 2015-06-06 NOTE — Assessment & Plan Note (Signed)
We discussed the treatment options in detail again. The risks/benefits/goals/expectations of pacing system extraction (atrial lead) and insertion of a new atrial lead and a MRI compatible PPM were discussed in detail and he wishes to proceed.

## 2015-06-11 ENCOUNTER — Telehealth: Payer: Self-pay | Admitting: Internal Medicine

## 2015-06-11 NOTE — Telephone Encounter (Signed)
New message     Patient seen Dr. Lovena Le as new consult - checking on the status of upcoming procedure

## 2015-06-11 NOTE — Telephone Encounter (Signed)
Calling wanting to know details of procedure and if has been scheduled.  Advised that Leonia Reader not in office today but will forward message to her.  States Dr. Lovena Le had told him Jul 10, 2015.  Will forward to [employer]

## 2015-06-12 NOTE — Telephone Encounter (Signed)
Spoke with patient and let him know 07/10/15 is not open to do procedure and will get back with him today with a date and time

## 2015-06-12 NOTE — Telephone Encounter (Signed)
Follow up      Calling to let nurse know that 07-10-15 will be a good day to have the procedure.  Please call pt to let him know it has been scheduled.

## 2015-06-13 NOTE — Telephone Encounter (Signed)
Spoke with patient and let him know the dates available which are 12/22 and 1/12.  He will call me back tomorrow and let me know

## 2015-06-14 NOTE — Telephone Encounter (Signed)
Spoke with patient and he is aware to be at the hospital at 5:30am on the day of procedure.  NPO after midnight.  Will draw labs at the hospital as type and cross can not be drawn at the office.

## 2015-06-14 NOTE — Telephone Encounter (Signed)
New Message   Pt can do Jan 12,2017  Please call pt back

## 2015-07-04 ENCOUNTER — Telehealth: Payer: Self-pay | Admitting: Internal Medicine

## 2015-07-04 NOTE — Telephone Encounter (Signed)
Spoke with patient and let him know the labs will be drawn at the hospital the day of the procedure

## 2015-07-04 NOTE — Telephone Encounter (Signed)
New Message  Pt calling to speak w/ RN concerning surgery next month. Please call back and discuss.

## 2015-07-07 DIAGNOSIS — Z950 Presence of cardiac pacemaker: Secondary | ICD-10-CM

## 2015-07-07 HISTORY — DX: Presence of cardiac pacemaker: Z95.0

## 2015-07-15 ENCOUNTER — Inpatient Hospital Stay (HOSPITAL_COMMUNITY)
Admission: RE | Admit: 2015-07-15 | Discharge: 2015-07-15 | Disposition: A | Discharge status: Home / Self Care | Payer: BC Managed Care – PPO | Source: Ambulatory Visit

## 2015-07-17 ENCOUNTER — Encounter (HOSPITAL_COMMUNITY): Payer: Self-pay

## 2015-07-17 ENCOUNTER — Encounter (HOSPITAL_COMMUNITY)
Admission: RE | Admit: 2015-07-17 | Discharge: 2015-07-17 | Disposition: A | Discharge status: Home / Self Care | Payer: BC Managed Care – PPO | Source: Ambulatory Visit | Attending: Internal Medicine | Admitting: Internal Medicine

## 2015-07-17 DIAGNOSIS — I442 Atrioventricular block, complete: Secondary | ICD-10-CM | POA: Diagnosis not present

## 2015-07-17 DIAGNOSIS — Z79899 Other long term (current) drug therapy: Secondary | ICD-10-CM | POA: Diagnosis not present

## 2015-07-17 DIAGNOSIS — G35 Multiple sclerosis: Secondary | ICD-10-CM | POA: Diagnosis not present

## 2015-07-17 DIAGNOSIS — I10 Essential (primary) hypertension: Secondary | ICD-10-CM | POA: Diagnosis not present

## 2015-07-17 DIAGNOSIS — I421 Obstructive hypertrophic cardiomyopathy: Secondary | ICD-10-CM | POA: Diagnosis not present

## 2015-07-17 DIAGNOSIS — Y831 Surgical operation with implant of artificial internal device as the cause of abnormal reaction of the patient, or of later complication, without mention of misadventure at the time of the procedure: Secondary | ICD-10-CM | POA: Diagnosis not present

## 2015-07-17 DIAGNOSIS — N400 Enlarged prostate without lower urinary tract symptoms: Secondary | ICD-10-CM | POA: Diagnosis not present

## 2015-07-17 DIAGNOSIS — T82110A Breakdown (mechanical) of cardiac electrode, initial encounter: Secondary | ICD-10-CM | POA: Diagnosis not present

## 2015-07-17 LAB — SURGICAL PCR SCREEN
MRSA, PCR: NEGATIVE
STAPHYLOCOCCUS AUREUS: NEGATIVE

## 2015-07-17 NOTE — Progress Notes (Signed)
Lab results received from Dr. Mayer Camel office and were placed on chart.

## 2015-07-17 NOTE — Pre-Procedure Instructions (Signed)
Rodney Adkins  07/17/2015     Your procedure is scheduled on Thursday, Jul 18, 2015 at 7:30 AM.  Report to [facility] Admitting at 5:30 AM.  Call this number if you have problems the morning of surgery: [phone]    Remember:  Do not eat food or drink liquids after midnight on Jan. 11  Take these medicines the morning of surgery with A SIP OF WATER : none             Stop herbal medicines/suppliments and NSAIDS: advil, motrin, ibuprofen,aleve, BC'S   Do not wear jewelry.  Do not wear lotions, powders, or cologne.    Men may shave face and neck.  Do not bring valuables to the hospital.  [facility] is not responsible for any belongings or valuables.  Contacts, dentures or bridgework may not be worn into surgery.  Leave your suitcase in the car.  After surgery it may be brought to your room.  For patients admitted to the hospital, discharge time will be determined by your treatment team.  Patients discharged the day of surgery will not be allowed to drive home.   Name and phone number of your driver:     Special instructions: Shower using CHG soap the night before and the morning of your surgery  Please read over the following fact sheets that you were given. Pain Booklet, Coughing and Deep Breathing, MRSA Information and Surgical Site Infection Prevention

## 2015-07-17 NOTE — Progress Notes (Signed)
Nurse called Dr. Mayer Camel office and spoke with Cristie Hem to check on the status of the fax that was sent requesting lab results from 07/09/15. Alex stated that there were over 300 faxes and that it would take approximately 3 days for them to respond. Nurse informed Cristie Hem that patients surgery was tomorrow morning, and requested that recent lab work be sent STAT. Cristie Hem stated he would fax the results as long as I sent another fax to [phone] instead of [phone] as it had been previously faxed.   Nurse re faxed request to [phone].    Will continue to await results

## 2015-07-17 NOTE — Progress Notes (Signed)
PCP is Harlan Stains  Patient sees Dr. Cristopher Peru for Medtronic pacemaker needs  Patient informed Nurse that he had lab work drawn at Dr. Vallery Ridge (Neurology) office in [place] on 07/09/15. STAT request for labs sent to Dr. Mayer Camel office at [phone]. Nurse informed patient that if lab work was not received today then lab work would need to be obtained tomorrow. Patient verbalized understanding.   Patient denied having any acute cardiac or pulmonary issues

## 2015-07-18 ENCOUNTER — Encounter (HOSPITAL_COMMUNITY)
Admission: RE | Disposition: A | Discharge status: Home / Self Care | Payer: Self-pay | Source: Ambulatory Visit | Attending: Internal Medicine

## 2015-07-18 ENCOUNTER — Ambulatory Visit (HOSPITAL_COMMUNITY): Payer: BC Managed Care – PPO | Admitting: Anesthesiology

## 2015-07-18 ENCOUNTER — Ambulatory Visit (HOSPITAL_COMMUNITY): Payer: BC Managed Care – PPO

## 2015-07-18 ENCOUNTER — Ambulatory Visit (HOSPITAL_COMMUNITY)
Admission: RE | Admit: 2015-07-18 | Discharge: 2015-07-19 | Disposition: A | Discharge status: Home / Self Care | Payer: BC Managed Care – PPO | Source: Ambulatory Visit | Attending: Internal Medicine | Admitting: Internal Medicine

## 2015-07-18 ENCOUNTER — Encounter (HOSPITAL_COMMUNITY): Payer: Self-pay | Admitting: *Deleted

## 2015-07-18 DIAGNOSIS — I421 Obstructive hypertrophic cardiomyopathy: Secondary | ICD-10-CM | POA: Insufficient documentation

## 2015-07-18 DIAGNOSIS — I442 Atrioventricular block, complete: Secondary | ICD-10-CM | POA: Insufficient documentation

## 2015-07-18 DIAGNOSIS — T82110A Breakdown (mechanical) of cardiac electrode, initial encounter: Secondary | ICD-10-CM | POA: Diagnosis not present

## 2015-07-18 DIAGNOSIS — I10 Essential (primary) hypertension: Secondary | ICD-10-CM | POA: Insufficient documentation

## 2015-07-18 DIAGNOSIS — Z79899 Other long term (current) drug therapy: Secondary | ICD-10-CM | POA: Insufficient documentation

## 2015-07-18 DIAGNOSIS — Y831 Surgical operation with implant of artificial internal device as the cause of abnormal reaction of the patient, or of later complication, without mention of misadventure at the time of the procedure: Secondary | ICD-10-CM | POA: Insufficient documentation

## 2015-07-18 DIAGNOSIS — T82110D Breakdown (mechanical) of cardiac electrode, subsequent encounter: Secondary | ICD-10-CM | POA: Diagnosis not present

## 2015-07-18 DIAGNOSIS — N400 Enlarged prostate without lower urinary tract symptoms: Secondary | ICD-10-CM | POA: Insufficient documentation

## 2015-07-18 DIAGNOSIS — Z950 Presence of cardiac pacemaker: Secondary | ICD-10-CM

## 2015-07-18 DIAGNOSIS — G35 Multiple sclerosis: Secondary | ICD-10-CM | POA: Insufficient documentation

## 2015-07-18 HISTORY — PX: TEE WITHOUT CARDIOVERSION: SHX5443

## 2015-07-18 HISTORY — PX: PACEMAKER LEAD REMOVAL: SHX5064

## 2015-07-18 LAB — TYPE AND SCREEN
ABO/RH(D): O POS
ANTIBODY SCREEN: NEGATIVE

## 2015-07-18 SURGERY — REMOVAL, ELECTRODE LEAD, CARDIAC PACEMAKER, WITHOUT REPLACEMENT
Anesthesia: General | Site: Chest

## 2015-07-18 MED ORDER — SODIUM CHLORIDE 0.9 % IV SOLN
Freq: Once | INTRAVENOUS | Status: AC
  Administered 2015-07-18: 08:00:00 via INTRAVENOUS

## 2015-07-18 MED ORDER — PROMETHAZINE HCL 25 MG/ML IJ SOLN: 6.2500 mg | INTRAMUSCULAR | Status: DC | PRN

## 2015-07-18 MED ORDER — SODIUM CHLORIDE 0.9 % IV SOLN: INTRAVENOUS | Status: DC

## 2015-07-18 MED ORDER — LIDOCAINE HCL (PF) 1 % IJ SOLN
INTRAMUSCULAR | Status: AC
  Filled 2015-07-18: qty 60

## 2015-07-18 MED ORDER — EPHEDRINE SULFATE 50 MG/ML IJ SOLN
INTRAMUSCULAR | Status: AC
  Filled 2015-07-18: qty 1

## 2015-07-18 MED ORDER — PHENYLEPHRINE HCL 10 MG/ML IJ SOLN
INTRAMUSCULAR | Status: DC | PRN
  Administered 2015-07-18 (×2): 40 ug via INTRAVENOUS
  Administered 2015-07-18: 80 ug via INTRAVENOUS
  Administered 2015-07-18: 40 ug via INTRAVENOUS
  Administered 2015-07-18 (×2): 80 ug via INTRAVENOUS
  Administered 2015-07-18 (×5): 40 ug via INTRAVENOUS

## 2015-07-18 MED ORDER — CEFAZOLIN SODIUM-DEXTROSE 2-3 GM-% IV SOLR
2.0000 g | INTRAVENOUS | Status: AC
  Administered 2015-07-18: 2 g via INTRAVENOUS
  Filled 2015-07-18: qty 50

## 2015-07-18 MED ORDER — SODIUM CHLORIDE 0.9 % IR SOLN
Status: DC | PRN
  Administered 2015-07-18: 500 mL

## 2015-07-18 MED ORDER — SODIUM CHLORIDE 0.9 % IJ SOLN
INTRAMUSCULAR | Status: AC
  Filled 2015-07-18: qty 10

## 2015-07-18 MED ORDER — PROPOFOL 10 MG/ML IV BOLUS
INTRAVENOUS | Status: AC
  Filled 2015-07-18: qty 20

## 2015-07-18 MED ORDER — MIDAZOLAM HCL 5 MG/5ML IJ SOLN
INTRAMUSCULAR | Status: DC | PRN
  Administered 2015-07-18: 2 mg via INTRAVENOUS

## 2015-07-18 MED ORDER — DEXAMETHASONE SODIUM PHOSPHATE 4 MG/ML IJ SOLN
INTRAMUSCULAR | Status: DC | PRN
  Administered 2015-07-18: 4 mg via INTRAVENOUS

## 2015-07-18 MED ORDER — HYDROMORPHONE HCL 1 MG/ML IJ SOLN
0.2500 mg | INTRAMUSCULAR | Status: DC | PRN
  Administered 2015-07-18: 0.25 mg via INTRAVENOUS

## 2015-07-18 MED ORDER — ONDANSETRON HCL 4 MG/2ML IJ SOLN: 4.0000 mg | Freq: Four times a day (QID) | INTRAMUSCULAR | Status: DC | PRN

## 2015-07-18 MED ORDER — ONDANSETRON HCL 4 MG/2ML IJ SOLN
INTRAMUSCULAR | Status: DC | PRN
  Administered 2015-07-18: 4 mg via INTRAVENOUS

## 2015-07-18 MED ORDER — EPHEDRINE SULFATE 50 MG/ML IJ SOLN
INTRAMUSCULAR | Status: DC | PRN
Start: 2015-07-18 — End: 2015-07-18
  Administered 2015-07-18 (×2): 5 mg via INTRAVENOUS

## 2015-07-18 MED ORDER — LACTATED RINGERS IV SOLN
INTRAVENOUS | Status: DC | PRN
  Administered 2015-07-18 (×2): via INTRAVENOUS

## 2015-07-18 MED ORDER — SODIUM CHLORIDE 0.9 % IV SOLN
INTRAVENOUS | Status: DC | PRN
  Administered 2015-07-18: 500 mL

## 2015-07-18 MED ORDER — LIDOCAINE HCL (CARDIAC) 20 MG/ML IV SOLN
INTRAVENOUS | Status: AC
  Filled 2015-07-18: qty 5

## 2015-07-18 MED ORDER — SUCCINYLCHOLINE CHLORIDE 20 MG/ML IJ SOLN
INTRAMUSCULAR | Status: AC
  Filled 2015-07-18: qty 1

## 2015-07-18 MED ORDER — HYDROMORPHONE HCL 1 MG/ML IJ SOLN
INTRAMUSCULAR | Status: AC
  Administered 2015-07-18: 0.25 mg via INTRAVENOUS
  Filled 2015-07-18: qty 1

## 2015-07-18 MED ORDER — FENTANYL CITRATE (PF) 250 MCG/5ML IJ SOLN
INTRAMUSCULAR | Status: AC
  Filled 2015-07-18: qty 5

## 2015-07-18 MED ORDER — ROCURONIUM BROMIDE 50 MG/5ML IV SOLN
INTRAVENOUS | Status: AC
  Filled 2015-07-18: qty 1

## 2015-07-18 MED ORDER — LIDOCAINE HCL (PF) 1 % IJ SOLN
INTRAMUSCULAR | Status: DC | PRN
  Administered 2015-07-18: 6 mL

## 2015-07-18 MED ORDER — SUGAMMADEX SODIUM 200 MG/2ML IV SOLN
INTRAVENOUS | Status: AC
  Filled 2015-07-18: qty 2

## 2015-07-18 MED ORDER — MIDAZOLAM HCL 2 MG/2ML IJ SOLN
INTRAMUSCULAR | Status: AC
  Filled 2015-07-18: qty 2

## 2015-07-18 MED ORDER — SUGAMMADEX SODIUM 200 MG/2ML IV SOLN
INTRAVENOUS | Status: DC | PRN
  Administered 2015-07-18: 150 mg via INTRAVENOUS

## 2015-07-18 MED ORDER — PHENYLEPHRINE 40 MCG/ML (10ML) SYRINGE FOR IV PUSH (FOR BLOOD PRESSURE SUPPORT)
PREFILLED_SYRINGE | INTRAVENOUS | Status: AC
  Filled 2015-07-18: qty 20

## 2015-07-18 MED ORDER — CHLORHEXIDINE GLUCONATE 4 % EX LIQD: 60.0000 mL | Freq: Once | CUTANEOUS | Status: DC

## 2015-07-18 MED ORDER — ROCURONIUM BROMIDE 100 MG/10ML IV SOLN
INTRAVENOUS | Status: DC | PRN
  Administered 2015-07-18: 50 mg via INTRAVENOUS

## 2015-07-18 MED ORDER — LIDOCAINE HCL (CARDIAC) 20 MG/ML IV SOLN
INTRAVENOUS | Status: DC | PRN
  Administered 2015-07-18: 100 mg via INTRAVENOUS

## 2015-07-18 MED ORDER — ACETAMINOPHEN 325 MG PO TABS
325.0000 mg | ORAL_TABLET | ORAL | Status: DC | PRN
  Administered 2015-07-18: 650 mg via ORAL
  Filled 2015-07-18: qty 2

## 2015-07-18 MED ORDER — DEXAMETHASONE SODIUM PHOSPHATE 4 MG/ML IJ SOLN
INTRAMUSCULAR | Status: AC
  Filled 2015-07-18: qty 1

## 2015-07-18 MED ORDER — SODIUM CHLORIDE 0.9 % IR SOLN
80.0000 mg | Status: DC
  Filled 2015-07-18 (×2): qty 2

## 2015-07-18 MED ORDER — CEFAZOLIN SODIUM 1-5 GM-% IV SOLN
1.0000 g | Freq: Four times a day (QID) | INTRAVENOUS | Status: AC
  Administered 2015-07-18 – 2015-07-19 (×3): 1 g via INTRAVENOUS
  Filled 2015-07-18 (×3): qty 50

## 2015-07-18 MED ORDER — FENTANYL CITRATE (PF) 100 MCG/2ML IJ SOLN
INTRAMUSCULAR | Status: DC | PRN
  Administered 2015-07-18: 50 ug via INTRAVENOUS
  Administered 2015-07-18: 150 ug via INTRAVENOUS
  Administered 2015-07-18: 50 ug via INTRAVENOUS

## 2015-07-18 MED ORDER — PROPOFOL 10 MG/ML IV BOLUS
INTRAVENOUS | Status: DC | PRN
  Administered 2015-07-18: 160 mg via INTRAVENOUS

## 2015-07-18 MED ORDER — TRAMADOL HCL 50 MG PO TABS
50.0000 mg | ORAL_TABLET | Freq: Four times a day (QID) | ORAL | Status: DC | PRN
  Filled 2015-07-18: qty 1

## 2015-07-18 MED ORDER — STERILE WATER FOR INJECTION IJ SOLN
INTRAMUSCULAR | Status: AC
  Filled 2015-07-18: qty 10

## 2015-07-18 SURGICAL SUPPLY — 57 items
BAG BANDED W/RUBBER/TAPE 36X54 (MISCELLANEOUS) ×4 IMPLANT
BAG DECANTER FOR FLEXI CONT (MISCELLANEOUS) ×8 IMPLANT
BENZOIN TINCTURE PRP APPL 2/3 (GAUZE/BANDAGES/DRESSINGS) ×4 IMPLANT
BLADE 10 SAFETY STRL DISP (BLADE) ×4 IMPLANT
BLADE OSCILLATING /SAGITTAL (BLADE) IMPLANT
BLADE STERNUM SYSTEM 6 (BLADE) ×4 IMPLANT
BLADE SURG 10 STRL SS (BLADE) ×4 IMPLANT
BNDG COHESIVE 4X5 WHT NS (GAUZE/BANDAGES/DRESSINGS) IMPLANT
CANISTER SUCTION 2500CC (MISCELLANEOUS) ×4 IMPLANT
CLOSURE STERI-STRIP 1/2X4 (GAUZE/BANDAGES/DRESSINGS) ×1
CLSR STERI-STRIP ANTIMIC 1/2X4 (GAUZE/BANDAGES/DRESSINGS) ×3 IMPLANT
COIL ONE TIE COMPRESSION (MISCELLANEOUS) ×4 IMPLANT
COVER SURGICAL LIGHT HANDLE (MISCELLANEOUS) ×4 IMPLANT
COVER TABLE BACK 60X90 (DRAPES) ×4 IMPLANT
DRAPE CARDIOVASCULAR INCISE (DRAPES) ×2
DRAPE SRG 135X102X78XABS (DRAPES) ×2 IMPLANT
DRSG OPSITE 6X11 MED (GAUZE/BANDAGES/DRESSINGS) IMPLANT
DRSG TEGADERM 2-3/8X2-3/4 SM (GAUZE/BANDAGES/DRESSINGS) ×4 IMPLANT
DRSG TEGADERM 4X4.75 (GAUZE/BANDAGES/DRESSINGS) ×4 IMPLANT
ELECT REM PT RETURN 9FT ADLT (ELECTROSURGICAL) ×8
ELECTRODE REM PT RTRN 9FT ADLT (ELECTROSURGICAL) ×4 IMPLANT
GAUZE SPONGE 4X4 12PLY STRL (GAUZE/BANDAGES/DRESSINGS) IMPLANT
GAUZE SPONGE 4X4 16PLY XRAY LF (GAUZE/BANDAGES/DRESSINGS) IMPLANT
GLOVE BIOGEL PI IND STRL 6.5 (GLOVE) ×2 IMPLANT
GLOVE BIOGEL PI IND STRL 7.5 (GLOVE) ×2 IMPLANT
GLOVE BIOGEL PI INDICATOR 6.5 (GLOVE) ×2
GLOVE BIOGEL PI INDICATOR 7.5 (GLOVE) ×2
GLOVE ECLIPSE 8.0 STRL XLNG CF (GLOVE) ×12 IMPLANT
GLOVE SURG SS PI 6.5 STRL IVOR (GLOVE) ×4 IMPLANT
GOWN STRL REUS W/ TWL LRG LVL3 (GOWN DISPOSABLE) IMPLANT
GOWN STRL REUS W/ TWL XL LVL3 (GOWN DISPOSABLE) ×2 IMPLANT
GOWN STRL REUS W/TWL LRG LVL3 (GOWN DISPOSABLE)
GOWN STRL REUS W/TWL XL LVL3 (GOWN DISPOSABLE) ×6 IMPLANT
GUIDEWIRE ANGLED .035X150CM (WIRE) ×4 IMPLANT
KIT ROOM TURNOVER OR (KITS) ×4 IMPLANT
LEAD CAPSURE NOVUS 5076-52CM (Lead) ×4 IMPLANT
NEEDLE PERC 18GX7CM (NEEDLE) ×4 IMPLANT
PAD ARMBOARD 7.5X6 YLW CONV (MISCELLANEOUS) ×8 IMPLANT
PAD ELECT DEFIB RADIOL ZOLL (MISCELLANEOUS) ×4 IMPLANT
PPM ADVISA MRI DR A2DR01 (Pacemaker) ×4 IMPLANT
PROTECTION STATION PRESSURIZED (MISCELLANEOUS) ×4
SHEATH EVOLUTION RL 9F (SHEATH) ×4 IMPLANT
SHEATH EVOLUTION SHORTIE RL 9F (SHEATH) ×4 IMPLANT
SHEATH PINNACLE 6F 10CM (SHEATH) ×4 IMPLANT
SPONGE GAUZE 4X4 12PLY STER LF (GAUZE/BANDAGES/DRESSINGS) ×8 IMPLANT
STATION PROTECTION PRESSURIZED (MISCELLANEOUS) ×2 IMPLANT
STYLET LIBERATOR LOCKING (MISCELLANEOUS) ×4 IMPLANT
SUT ETHIBOND 2 0 SH (SUTURE) ×2
SUT ETHIBOND 2 0 SH 36X2 (SUTURE) ×2 IMPLANT
SUT PROLENE 2 0 SH DA (SUTURE) IMPLANT
TOWEL OR 17X24 6PK STRL BLUE (TOWEL DISPOSABLE) ×8 IMPLANT
TOWEL OR 17X26 10 PK STRL BLUE (TOWEL DISPOSABLE) ×8 IMPLANT
TRAY FOLEY IC TEMP SENS 14FR (CATHETERS) ×8 IMPLANT
TUBE CONNECTING 12'X1/4 (SUCTIONS)
TUBE CONNECTING 12X1/4 (SUCTIONS) IMPLANT
WIRE J 3MM .035X145CM (WIRE) ×4 IMPLANT
YANKAUER SUCT BULB TIP NO VENT (SUCTIONS) IMPLANT

## 2015-07-18 NOTE — Anesthesia Procedure Notes (Signed)
Procedure Name: Intubation Date/Time: 07/18/2015 8:08 AM Performed by: Ollen Bowl Pre-anesthesia Checklist: Patient identified, Emergency Drugs available, Suction available, Patient being monitored and Timeout performed Patient Re-evaluated:Patient Re-evaluated prior to inductionOxygen Delivery Method: Circle system utilized and Simple face mask Preoxygenation: Pre-oxygenation with 100% oxygen Intubation Type: IV induction Ventilation: Mask ventilation without difficulty Laryngoscope Size: Miller and 3 Grade View: Grade I Tube type: Oral Tube size: 7.5 mm Number of attempts: 1 Airway Equipment and Method: Patient positioned with wedge pillow and Stylet Placement Confirmation: ETT inserted through vocal cords under direct vision,  positive ETCO2 and breath sounds checked- equal and bilateral Secured at: 22 cm Tube secured with: Tape Dental Injury: Teeth and Oropharynx as per pre-operative assessment

## 2015-07-18 NOTE — OR Nursing (Signed)
07/18/2015 : Or Nursing Note: Right groin venous sheath removed at 1001. Pressure held for 15 minutes by Carl Best, RCIS.

## 2015-07-18 NOTE — H&P (Signed)
HPI [name] returns today for followup. He is a pleasant 60 yo man with complete heart block who was found to have a broken atrial lead and has been seen in the past regarding lead extraction. He has gotten a second opinion in [place] by Dr. Edison Nasuti. He returns today for followup. In the interim, he feels well. He has multiple questions regarding the technical details of his procedure.  No Known Allergies   Current Outpatient Prescriptions  Medication Sig Dispense Refill  . Ascorbic Acid (VITAMIN C) 500 MG tablet Take 500 mg by mouth daily as needed (Vitamin c supplement).     . cholecalciferol (VITAMIN D) 1000 UNITS tablet Take 1,000 Units by mouth daily.    . Dimethyl Fumarate (TECFIDERA) 240 MG CPDR Take 240 mg by mouth 2 (two) times daily.    . Multiple Vitamin (MULTIVITAMIN) tablet Take 1 tablet by mouth daily as needed (Vitamin supplement).      No current facility-administered medications for this visit.     Past Medical History  Diagnosis Date  . Multiple sclerosis ([place]) 1998    Sxs started 1998 with L body numbness. MRI c/w MS. Started Avonex. Saw Dr [name] at [facility] and switched to Betaseron and was on for 5 yrs but developed depression and site rxns and D/C'd 2006. While on Betaseron, occassionally required solumedrol for flares.  . Complete heart block [facility]) June 2009    a. Presented with hypotension/pulm edema 12/2007 when HOCM was also discovered. s/p dual chamber MDT pacemaker. Thallium stest 7/09 negative for infiltrative disease or ischemia. Followed Dr [name]  Marland Kitchen HOCM (hypertrophic obstructive cardiomyopathy) ([place]) 2009    Discovered by ECHO when he developed CHB. Mgmt Dr Caryl Comes and Dr [name] at [facility]. Has outflow obstruction on ECHO 2011  . Right bundle branch block   . Wenckebach     a. Decreased ex tolerance 2010/2012 - underwent stress echo to look @ effects of MV/LVOT - nothing noted; although pt had  high rate Wenckebach behavior with associated ex intol. Pacer reprogrammed with improvement in sx.  . SOB (shortness of breath)     a. PFTs 09/2010 essentially normal per pulm note (mild obst defect). b. 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  . Prostate enlargement   . Heart murmur     ROS:  All systems reviewed and negative except as noted in the HPI.   Past Surgical History  Procedure Laterality Date  . Leg tendon surgery  2008    Os peroneus resection and peroneal tendon repair  . Pacemaker insertion  12/2007    Complete heart block  . Left heart catheterization with coronary angiogram N/A 08/22/2013    Procedure: LEFT HEART CATHETERIZATION WITH CORONARY ANGIOGRAM; Surgeon: [name], MD; Location: [facility] CATH LAB; Service: Cardiovascular; Laterality: N/A;  . Pace maker    . Peripheral vascular catheterization N/A 02/12/2015    Procedure: Venogram; Surgeon: [name], MD; Location: [place] CV LAB; Service: Cardiovascular; Laterality: N/A;     Family History  Problem Relation Age of Onset  . Heart disease Neg Hx   . Colon polyps Father   . Hypertension Father   . Diabetes Paternal Grandmother      Social History   Social History  . Marital Status: Married    Spouse Name: N/A  . Number of Children: 0  . Years of Education: N/A   Occupational History  . professor [employer]   Social History Main Topics  .  Smoking status: Never Smoker   . Smokeless tobacco: Never Used  . Alcohol Use: No  . Drug Use: No  . Sexual Activity: Not on file   Other Topics Concern  . 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     BP 108/68 mmHg  Pulse 74  Ht 5\' 10"  (1.778 m)  Wt 136 lb 6.4 oz (61.871 kg)  BMI 19.57 kg/m2  Physical Exam:  Well appearing NAD HEENT:  Unremarkable Neck: No JVD, no thyromegally Lymphatics: No adenopathy Back: No CVA tenderness Lungs: Clear with no wheezes. Skin over the left chest has evidence of enlarged veins. HEART: Regular rate rhythm, no murmurs, no rubs, no clicks Abd: soft, positive bowel sounds, no organomegally, no rebound, no guarding Ext: 2 plus pulses, no edema, no cyanosis, no clubbing Skin: No rashes no nodules Neuro: CN II through XII intact, motor grossly intact   Assess/Plan:            Pacemaker lead failure - [name], MD at 06/06/2015 9:23 AM     Status: Written Related Problem: Pacemaker lead failure   Expand All Collapse All   We discussed the treatment options in detail again. The risks/benefits/goals/expectations of pacing system extraction (atrial lead) and insertion of a new atrial lead and a MRI compatible PPM were discussed in detail and he wishes to proceed.             PVC (premature ventricular contraction) - [name], MD at 06/06/2015 9:23 AM     Status: Written Related Problem: PVC (premature ventricular contraction)   Expand All Collapse All   He appears to be asymptomatic. Will follow.        EP Attending  Patient seen and examined. He is appropriately anxious this morning. Since clinic visit above, there has been no change. Ok to proceed with atrial lead removal. Will plan to place a new PM generator with MRI compatibility if possible.   [name].D.

## 2015-07-18 NOTE — Addendum Note (Signed)
Addendum  created 07/18/15 1455 by Duane Boston, MD   Modules edited: Anesthesia Events

## 2015-07-18 NOTE — Anesthesia Postprocedure Evaluation (Signed)
Anesthesia Post Note  Patient: Rodney Adkins  Procedure(s) Performed: Procedure(s) (LRB): ATRIAL PACEMAKER LEAD EXTRACTION; ATRIAL PACEMAKER LEAD INSERTION; GENERATOR REPLACEMENT. (Left) TRANSESOPHAGEAL ECHOCARDIOGRAM (TEE) (N/A)  Patient location during evaluation: PACU Anesthesia Type: General Level of consciousness: sedated Pain management: pain level controlled Vital Signs Assessment: post-procedure vital signs reviewed and stable Respiratory status: spontaneous breathing and respiratory function stable Cardiovascular status: stable Anesthetic complications: no    Last Vitals:  Filed Vitals:   07/18/15 1226 07/18/15 1241  BP: 111/57 105/60  Pulse: 74 64  Temp:    Resp: 15 10    Last Pain: There were no vitals filed for this visit.               Hisashi Amadon DANIEL

## 2015-07-18 NOTE — Progress Notes (Signed)
  Echocardiogram Echocardiogram Transesophageal has been performed.  Bobbye Charleston 07/18/2015, 9:07 AM

## 2015-07-18 NOTE — Progress Notes (Signed)
This nurse called Dannial Monarch, [employer] rep, whom reported that she was aware of 0730 procedure in the OR and will be present.

## 2015-07-18 NOTE — Anesthesia Preprocedure Evaluation (Addendum)
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 s/p dual chamber MDT pacemaker. Thallium stest 7/09 negative for infiltrative disease or ischemia.  Echo 08/09/13: Study Conclusions  - Left ventricle: The cavity size was normal. Systolic function was vigorous. The estimated ejection fraction was in the range of 65% to 70%. Wall motion was normal; there were no regional wall motion abnormalities. Features are consistent with a pseudonormal left ventricular filling pattern, with concomitant abnormal relaxation and increased filling pressure (grade 2 diastolic dysfunction). - Aortic valve: There was very mild stenosis.   Neuro/Psych Multiple Sclerosis negative psych ROS   GI/Hepatic negative GI ROS, Neg liver ROS,   Endo/Other  negative endocrine ROS  Renal/GU negative Renal ROS     Musculoskeletal   Abdominal   Peds  Hematology   Anesthesia Other Findings   Reproductive/Obstetrics                           Anesthesia Physical Anesthesia Plan  ASA: III  Anesthesia Plan: General   Post-op Pain Management:    Induction:   Airway Management Planned: Oral ETT  Additional Equipment: Arterial line  Intra-op Plan:   Post-operative Plan: Extubation in OR  Informed Consent: I have reviewed the patients History and Physical, chart, labs and discussed the procedure including the risks, benefits and alternatives for the  proposed anesthesia with the patient or authorized representative who has indicated his/her understanding and acceptance.   Dental advisory given  Plan Discussed with: CRNA, Anesthesiologist and Surgeon  Anesthesia Plan Comments:        Anesthesia Quick Evaluation

## 2015-07-18 NOTE — CV Procedure (Signed)
EP Procedure Note  Procedure: Pacemaker lead extraction, insertion of a new atrial pacing, removal of the previously implanted PM and insertion of an MRI compatible PM with relocation of the patient's skin pocket in the subpectoral location.  Preprocedure diagnosis:  Atrial lead fracture  Post procedure diagnosis: Same as preprocedure diagnosis  Description of the procedure: After informed consent was obtained, the patient was taken to the operating Room  In the fasting state. After the usual preparation and draping,, including initiation of endotracheal anesthesia  And insertion of a trans-esophageal, A 6 French sheath was placed in the right femoral vein. A 5 cm incision was carried out over the left pectoral region. Electrocautery  Was utilized to dissect down  To the pacemaker pocket.  The pacemaker generator was removed.  Electrocautery was utilized to free up the dense fibrous scar tissue around the atrial lead. The silk suture was removed.  A stylette was inserted into the, and the helix retracted.  The lead was cut.  A Cook Liberator locking style was inserted into the lead body  And fixed in place. One tie suture was placed on the proximal portion of the lead. The Cook 9 [place] RL short mechanical extraction sheath was  Over the atrial lead and advanced to the innominate vein. The short  Sheath was removed and the  Long Cook  9 French  RL sheath was advanced  Over the atrial lead.  Utilizing a combination of pressure and counter pressure, traction and  Countertraction, the atrial lead was removed in total. Access to the central circulation was maintained with the outer sheath. A guidewire was inserted into the central circulation. A 7 French  Sheath followed by  A Medtronic 5076 active fixation pacing lead, serial #  [id number], Was advanced into the right atrium and placed in the anterolateral portion of the right atrium. P waves measured 5 mV. The pacing impedance was 781 ohm. The pacing  threshold was 1 V at 0.5 ms. With the satisfactory parameters, the lead was secured with silk suture. The sewing sleeve was secured with Silk suture. At this point,the pocket was irrigated. Blunt dissection  Was utilized to dissect down to the pectoralis minor. A subpectoral pocket was created. The new atrial lead, the old ventricular lead,and the new Medtronic MRI compatible dual-chamber pacemaker, serial number [id number], Were connected and placed in the sub pectoral pocket.  The pocket was irrigated. Pectoralis major was reapproximated with Vicryl suture, and the deep tissue was closed with multiple layers of Vicryl suture. The skin was closed with Vicryl Benzoin and Steri-Strips  Were painted on the skin, and the patient was returned to the Recovery area in satisfactory condition.  Complications: There were no immediate procedural complications  Estimated blood loss: 50 cc  Conclusion: Successful extraction of an atrial pacing lead, removal of a permanent pacemaker, insertion of a new atrial pacing , and a new MRI compatible pacemaker in a patient with complete heart  Block,  And a broken atrial pacing lead.  Cristopher Peru, M.D.

## 2015-07-18 NOTE — Transfer of Care (Signed)
Immediate Anesthesia Transfer of Care Note  Patient: ODA TREVIZO  Procedure(s) Performed: Procedure(s) with comments: ATRIAL PACEMAKER LEAD EXTRACTION; ATRIAL PACEMAKER LEAD INSERTION; GENERATOR REPLACEMENT. (Left) - [name] to back up TRANSESOPHAGEAL ECHOCARDIOGRAM (TEE) (N/A)  Patient Location: PACU  Anesthesia Type:General  Level of Consciousness: awake and alert   Airway & Oxygen Therapy: Patient Spontanous Breathing and Patient connected to nasal cannula oxygen  Post-op Assessment: Report given to RN and Post -op Vital signs reviewed and stable  Post vital signs: Reviewed and stable  Last Vitals:  Filed Vitals:   07/18/15 0624  BP: 114/60  Pulse: 61  Temp: 36.3 C  Resp: 18    Complications: No apparent anesthesia complications

## 2015-07-18 NOTE — Progress Notes (Signed)
Patient ID: Rodney Adkins, male   DOB: 09/13/1955, 60 y.o.   MRN: [id number]       [address]       [place] [address]             [phone]    I was asked by Dr. Lovena Le to provide dedicated surgical backup for the atrial lead extraction on patient Rodney Adkins and replacement with a new atrial lead. This was provided from 7:30 AM on the morning of January 12 until 10 AM. Grace Isaac MD      [address] [place] [address] Office [phone]   [place]

## 2015-07-19 ENCOUNTER — Encounter (HOSPITAL_COMMUNITY): Payer: Self-pay | Admitting: Internal Medicine

## 2015-07-19 ENCOUNTER — Observation Stay (HOSPITAL_COMMUNITY): Payer: BC Managed Care – PPO

## 2015-07-19 DIAGNOSIS — I442 Atrioventricular block, complete: Secondary | ICD-10-CM | POA: Diagnosis not present

## 2015-07-19 DIAGNOSIS — T82110A Breakdown (mechanical) of cardiac electrode, initial encounter: Secondary | ICD-10-CM | POA: Diagnosis not present

## 2015-07-19 LAB — ABO/RH: ABO/RH(D): O POS

## 2015-07-19 NOTE — Discharge Instructions (Signed)
° ° °  Supplemental Discharge Instructions for  Pacemaker/Defibrillator Patients  Activity No heavy lifting or vigorous activity with your left/right arm for 6 to 8 weeks.  Do not raise your left/right arm above your head for one week.  Gradually raise your affected arm as drawn below.             07/23/15                    07/24/15                    07/25/15                  07/26/15 __  NO DRIVING for  1 week   ; you may begin driving on   [id number]  .  WOUND CARE - Keep the wound area clean and dry.  Do not get this area wet for one week. No showers for one week; you may shower on   07/26/15  . - The tape/steri-strips on your wound will fall off; do not pull them off.  No bandage is needed on the site.  DO  NOT apply any creams, oils, or ointments to the wound area. - If you notice any drainage or discharge from the wound, any swelling or bruising at the site, or you develop a fever > 101? F after you are discharged home, call the office at once.  Special Instructions - You are still able to use cellular telephones; use the ear opposite the side where you have your pacemaker/defibrillator.  Avoid carrying your cellular phone near your device. - When traveling through airports, show security personnel your identification card to avoid being screened in the metal detectors.  Ask the security personnel to use the hand wand. - Avoid arc welding equipment, MRI testing (magnetic resonance imaging), TENS units (transcutaneous nerve stimulators).  Call the office for questions about other devices. - Avoid electrical appliances that are in poor condition or are not properly grounded. - Microwave ovens are safe to be near or to operate.  Additional information for defibrillator patients should your device go off: - If your device goes off ONCE and you feel fine afterward, notify the device clinic nurses. - If your device goes off ONCE and you do not feel well afterward, call 911. - If your device goes  off TWICE, call 911. - If your device goes off THREE times in one day, call 911.  DO NOT DRIVE YOURSELF OR A FAMILY MEMBER WITH A DEFIBRILLATOR TO THE HOSPITAL--CALL 911.

## 2015-07-19 NOTE — Progress Notes (Signed)
Pt has orders to be discharged. Discharge instructions given and pt has no additional questions at this time. Medication regimen reviewed and pt educated. Pt verbalized understanding and has no additional questions. Telemetry box removed. IV removed and site in good condition. Pt stable and waiting for transportation.   Tyan Dy RN 

## 2015-07-19 NOTE — Discharge Summary (Signed)
ELECTROPHYSIOLOGY PROCEDURE DISCHARGE SUMMARY    Patient ID: Rodney Adkins,  MRN: [id number], DOB/AGE: [birth year] 61 y.o.  Admit date: 07/18/2015 Discharge date: 07/19/2015  Primary Care Physician: Vidal Schwalbe, MD Electrophysiologist: Dr. Caryl Comes  Primary Discharge Diagnosis:  CHB, Pacemaker atrial  lead fracture  Secondary Discharge Diagnosis:  1. MS 2. HCM 3. HTN  No Known Allergies   Procedures This Admission:  1. Extraction of right atrial lead and removal of pacemaker generator, relocation of pacemaker pocket to subpectoral location 2.  Implantation of a new MRI compatible MDT dual chamber PPM (now subpectoral) on 07/17/14 by Dr Lovena Le.  The patient received a Medtronic MRI compatible dual-chamber pacemaker, serial number [id number], with Medtronic 5076 active fixation pacing lead, serial # [id number] right atrial lead he remained with hischronic right ventricular lead in place. There were no immediate post procedure complications. 3.  CXR on 07/19/2015 demonstrated no pneumothorax status post device implantation. Leads were both in good position.  Brief HPI: Rodney BALLOG is a 60 y.o. male was referred to electrophysiology in the outpatient setting for consideration of PPM implantation.  Past medical history includes Multiple sclerosis, CHB, HCM.  The patient has had symptomatic bradycardia without reversible causes identified.  Risks, benefits, and alternatives to PPM implantation were reviewed with the patient who wished to proceed.   Hospital Course:  The patient was admitted and underwent atrial lead extraction and pacemaker generator removal and implantation of a new PPM and atrial lead with details as outlined above.  He was monitored on telemetry overnight which demonstrated P synchronous ventricular pacing.  Left chest was without hematoma or ecchymosis.  The device was interrogated and found to be functioning normally.  CXR was obtained and demonstrated no  pneumothorax status post device implantation.  Wound care, arm mobility, and restrictions were reviewed with the patient.  The patient was examined by Dr. Lovena Le and considered stable for discharge to home.    Physical Exam: Filed Vitals:   07/18/15 1312 07/18/15 1345 07/18/15 2157 07/19/15 0419  BP:  105/58 93/52 99/59   Pulse:  66 60 59  Temp: 97.9 F (36.6 C) 97.8 F (36.6 C) 98.2 F (36.8 C) 98.1 F (36.7 C)  TempSrc:  Oral Oral Oral  Resp:  18 15 16   Weight:    133 lb 14.4 oz (60.737 kg)  SpO2:  99% 99% 96%    GEN- The patient is well appearing, alert and oriented x 3 today.   HEENT: normocephalic, atraumatic; sclera clear, conjunctiva pink; hearing intact; oropharynx clear; neck supple, no JVP Lungs- Clear to ausculation bilaterally, normal work of breathing.  No wheezes, rales, rhonchi Heart- Regular rate and rhythm, no murmurs, rubs or gallops, PMI not laterally displaced GI- soft, non-tender, non-distended, bowel sounds present, no hepatosplenomegaly Extremities- no clubbing, cyanosis, or edema; DP/PT/radial pulses 2+ bilaterally MS- no significant deformity or atrophy Skin- warm and dry, no rash or lesion, left chest without hematoma/ecchymosis Psych- euthymic mood, full affect Neuro- no gross deficits   Labs:   Lab Results  Component Value Date   WBC 7.1 02/08/2015   HGB 15.2 02/08/2015   HCT 44.3 02/08/2015   MCV 90.5 02/08/2015   PLT 231.0 02/08/2015   No results for input(s): NA, K, CL, CO2, BUN, CREATININE, CALCIUM, PROT, BILITOT, ALKPHOS, ALT, AST, GLUCOSE in the last 168 hours.  Invalid input(s): LABALBU  Discharge Medications:    Medication List    ASK your doctor about these medications  cholecalciferol 1000 units tablet  Commonly known as:  VITAMIN D  Take 1,000 Units by mouth daily.     multivitamin tablet  Take 1 tablet by mouth daily.     TECFIDERA 240 MG Cpdr  Generic drug:  Dimethyl Fumarate  Take 240 mg by mouth 2 (two) times  daily.     vitamin C 500 MG tablet  Commonly known as:  ASCORBIC ACID  Take 500 mg by mouth daily as needed (Vitamin c supplement).        Disposition: Home    Duration of Discharge Encounter: Greater than 30 minutes including physician time.  Venetia Night, PA-C 07/19/2015 4:54 AM  EP Attending  Patient seen and examined. Agree with the findings as noted above. His new atrial lead is functioning appropriately. Incision looks good. He is stable for discharge with the usual followup.   [name].D.

## 2015-07-25 ENCOUNTER — Encounter (HOSPITAL_COMMUNITY): Payer: Self-pay | Admitting: Internal Medicine

## 2015-08-01 ENCOUNTER — Encounter: Payer: Self-pay | Admitting: Internal Medicine

## 2015-08-01 ENCOUNTER — Ambulatory Visit (INDEPENDENT_AMBULATORY_CARE_PROVIDER_SITE_OTHER): Payer: BC Managed Care – PPO | Admitting: *Deleted

## 2015-08-01 DIAGNOSIS — I442 Atrioventricular block, complete: Secondary | ICD-10-CM | POA: Diagnosis not present

## 2015-08-01 DIAGNOSIS — Z950 Presence of cardiac pacemaker: Secondary | ICD-10-CM | POA: Diagnosis not present

## 2015-08-01 LAB — CUP PACEART INCLINIC DEVICE CHECK
Brady Statistic AP VS Percent: 0 %
Brady Statistic AS VP Percent: 89.24 %
Brady Statistic AS VS Percent: 0 %
Brady Statistic RV Percent Paced: 100 %
Date Time Interrogation Session: 20170126140244
Implantable Lead Implant Date: 20170112
Implantable Lead Location: 753860
Implantable Lead Model: 5076
Lead Channel Impedance Value: 380 Ohm
Lead Channel Impedance Value: 475 Ohm
Lead Channel Pacing Threshold Amplitude: 0.5 V
Lead Channel Pacing Threshold Amplitude: 1 V
Lead Channel Setting Pacing Amplitude: 2.5 V
Lead Channel Setting Pacing Amplitude: 3.5 V
Lead Channel Setting Sensing Sensitivity: 5.6 mV
MDC IDC LEAD IMPLANT DT: 20090608
MDC IDC LEAD LOCATION: 753859
MDC IDC MSMT BATTERY REMAINING LONGEVITY: 122 mo
MDC IDC MSMT BATTERY VOLTAGE: 3.1 V
MDC IDC MSMT LEADCHNL RA IMPEDANCE VALUE: 399 Ohm
MDC IDC MSMT LEADCHNL RA IMPEDANCE VALUE: 456 Ohm
MDC IDC MSMT LEADCHNL RA PACING THRESHOLD PULSEWIDTH: 0.4 ms
MDC IDC MSMT LEADCHNL RA SENSING INTR AMPL: 5 mV
MDC IDC MSMT LEADCHNL RV PACING THRESHOLD PULSEWIDTH: 0.4 ms
MDC IDC SET LEADCHNL RV PACING PULSEWIDTH: 0.4 ms
MDC IDC STAT BRADY AP VP PERCENT: 10.76 %
MDC IDC STAT BRADY RA PERCENT PACED: 10.76 %

## 2015-08-01 NOTE — Progress Notes (Signed)
Wound check appointment. Steri-strips removed. Wound without redness or edema. Incision edges approximated aside from pinhole on left lateral incision, GT saw patient, recommended washing 2x daily with soap and warm water. Patient aware to call with signs/symptoms of infection. Normal device function. Thresholds, sensing, and impedances consistent with implant measurements. RA output at 3.5V/auto capture programmed on for extra safety margin until 3 month visit; RV output at chronic settings. Histogram distribution appropriate for patient and level of activity. No mode switches or high ventricular rates noted. Patient educated about wound care, arm mobility, lifting restrictions. ROV in 3 months with GT.

## 2015-09-03 ENCOUNTER — Ambulatory Visit (INDEPENDENT_AMBULATORY_CARE_PROVIDER_SITE_OTHER): Payer: BC Managed Care – PPO | Admitting: *Deleted

## 2015-09-03 ENCOUNTER — Ambulatory Visit (INDEPENDENT_AMBULATORY_CARE_PROVIDER_SITE_OTHER): Payer: BC Managed Care – PPO | Admitting: Physician Assistant

## 2015-09-03 ENCOUNTER — Encounter: Payer: Self-pay | Admitting: Internal Medicine

## 2015-09-03 ENCOUNTER — Encounter: Payer: Self-pay | Admitting: Physician Assistant

## 2015-09-03 VITALS — BP 90/58 | HR 72 | Ht 70.0 in | Wt 142.8 lb

## 2015-09-03 DIAGNOSIS — R002 Palpitations: Secondary | ICD-10-CM

## 2015-09-03 DIAGNOSIS — Z950 Presence of cardiac pacemaker: Secondary | ICD-10-CM

## 2015-09-03 DIAGNOSIS — R0609 Other forms of dyspnea: Secondary | ICD-10-CM

## 2015-09-03 DIAGNOSIS — I442 Atrioventricular block, complete: Secondary | ICD-10-CM

## 2015-09-03 DIAGNOSIS — I421 Obstructive hypertrophic cardiomyopathy: Secondary | ICD-10-CM

## 2015-09-03 NOTE — Patient Instructions (Addendum)
Medication Instructions:  Your physician recommends that you continue on your current medications as directed. Please refer to the Current Medication list given to you today.  Labwork: NONE  Testing/Procedures: NONE  Follow-Up: Your physician wants you to follow-up in: 6 months with Dr. [name] You will receive a reminder letter in the mail two months in advance. If you don't receive a letter, please call our office to schedule the follow-up appointment.  Your physician wants you to call the office to schedule a exercise treadmill stress test if your symptoms do not get any better.  Your physician wants you to eat lunch daily and increase your food and drink intake that includes protein.    If you need a refill on your cardiac medications before your next appointment, please call your pharmacy.

## 2015-09-03 NOTE — Progress Notes (Signed)
Pacemaker check in clinic, added on per Rosaria Ferries, PA for recent exercise intolerance and dyspnea. Interrogation only for episodes, no testing performed. Incision well healed. Normal device function. Device programmed to maximize longevity. No mode switch or high ventricular rates noted. Device programmed at appropriate safety margins. Histogram distribution appropriate for patient activity level. Noted upper track rate set at 200bpm (since GXT in 2012), reviewed with WC, no changes at this time, defer to SK and GT. Device programmed to optimize intrinsic conduction. Estimated longevity 10.5 years. Patient education completed. ROV with GT on 10/09/15 as previously scheduled.

## 2015-09-03 NOTE — Progress Notes (Signed)
Cardiology Office Note   Date:  09/03/2015   ID:  Rodney, Adkins 1955-10-24, MRN [id number]  PCP:  Vidal Schwalbe, MD  Cardiologist:  Dr Suzie Portela, PA-C   Chief Complaint  Patient presents with  . Follow-up    SOB- No chest pain/tightness/pressure    History of Present Illness: Rodney Adkins is a 60 y.o. male with a history of MS, HOCM, CHB>>MDT PPM.   D/C 01/13 after extraction of RA lead and generator, replacement w/ generator subpectoral. Wound check 01/26 was OK.  Rodney Adkins presents for evaluation of dyspnea on exertion.  Rodney Adkins has occasional problems with orthostatic dizziness. He has recently begun noticing that he gets dyspneic much more easily than usual. He has been unable to do his usual exercises because of his recent procedure. He is normally very active, doing yoga and has been a runner in the past. He states the other day he was walking up a hill with her friend and had to stop before reaching the top because he was so short of breath. This very unusual for him. Once he got inside the building, he had to stop again. He is very concerned about this as he wants to increase back to his previous activity level once he is fully recovered from the device.  He will feel his heart started to pound when he starts going up a hill. He is concerned about this as he feels it is unusual  He has not had lower extremity edema, orthopnea or PND. His weight has not changed significantly. He feels his fluid intake is not high, feels it is normal. He misses meals on a regular basis. He does not use salt. He is a vegetarian. He does eat cheese and milk products   Past Medical History  Diagnosis Date  . Multiple sclerosis ([place]) 1998    Sxs started 1998 with L body numbness. MRI c/w MS. Started Avonex. Saw Dr [name] at [facility] and switched to Betaseron and was on for 5 yrs but developed depression and site rxns and D/C'd 2006. While on Betaseron,  occassionally required solumedrol for flares.  . Complete heart block [facility]) June 2009    a. Presented with hypotension/pulm edema 12/2007 when HOCM was also discovered. s/p dual chamber MDT pacemaker. Thallium stest 7/09 negative for infiltrative disease or ischemia.  Followed Dr [name]  Marland Kitchen HOCM (hypertrophic obstructive cardiomyopathy) ([place]) 2009    Discovered by ECHO when he developed CHB. Mgmt Dr Caryl Comes  and Dr [name] at [facility]. Has outflow obstruction on ECHO 2011  . Right bundle branch block   . Wenckebach     a. Decreased ex tolerance 2010/2012 - underwent stress echo to look @ effects of MV/LVOT - nothing noted; although pt had high rate Wenckebach behavior with associated ex intol. Pacer reprogrammed with improvement in sx.  . SOB (shortness of breath)     a. PFTs 09/2010 essentially normal per pulm note (mild obst defect). b. 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  . Prostate enlargement   . Heart murmur     Past Surgical History  Procedure Laterality Date  . Leg tendon surgery  2008    Os peroneus resection and peroneal tendon repair  . Pacemaker insertion  12/2007    Complete heart block  . Left heart catheterization with coronary angiogram N/A 08/22/2013    Procedure: LEFT HEART CATHETERIZATION WITH CORONARY ANGIOGRAM;  Surgeon: [name], MD;  Location:  [place] CATH LAB;  Service: Cardiovascular;  Laterality: N/A;  . Pace maker    . Peripheral vascular catheterization N/A 02/12/2015    Procedure: Venogram;  Surgeon: [name], MD;  Location: [place] CV LAB;  Service: Cardiovascular;  Laterality: N/A;  . Pacemaker lead removal Left 07/18/2015    Procedure: ATRIAL PACEMAKER LEAD EXTRACTION; ATRIAL PACEMAKER LEAD INSERTION; GENERATOR REPLACEMENT.;  Surgeon: [name], MD;  Location: [place];  Service: Cardiovascular;  Laterality: Left;  Rodney Adkins to back up  . Tee without cardioversion N/A 07/18/2015    Procedure: TRANSESOPHAGEAL ECHOCARDIOGRAM (TEE);  Surgeon: [name], MD;  Location: [facility];  Service: Cardiovascular;  Laterality: N/A;    Current Outpatient Prescriptions  Medication Sig Dispense Refill  . Ascorbic Acid (VITAMIN C) 500 MG tablet Take 500 mg by mouth daily as needed (Vitamin c supplement).     . cholecalciferol (VITAMIN D) 1000 UNITS tablet Take 1,000 Units by mouth daily. Take 5,000 units daily    . Dimethyl Fumarate (TECFIDERA) 240 MG CPDR Take 240 mg by mouth 2 (two) times daily.    . Multiple Vitamin (MULTIVITAMIN) tablet Take 1 tablet by mouth daily.      No current facility-administered medications for this visit.    Allergies:   Review of patient's allergies indicates no known allergies.    Social History:  The patient  reports that he has never smoked. He has never used smokeless tobacco. He reports that he does not drink alcohol or use illicit drugs.   Family History:  The patient's family history includes Colon polyps in his father; Diabetes in his paternal grandmother; Hypertension in his father. There is no history of Heart disease.    ROS:  Please see the history of present illness. All other systems are reviewed and negative.    PHYSICAL EXAM: VS:  BP 90/58 mmHg  Pulse 72  Ht 5\' 10"  (1.778 m)  Wt 142 lb 12.8 oz (64.774 kg)  BMI 20.49 kg/m2  SpO2 98% , BMI Body mass index is 20.49 kg/(m^2). GEN: Well nourished, well developed, male in no acute distress HEENT: normal for age  Neck: no JVD, no carotid bruit, no masses Cardiac: RRR; 2-3/6 murmur, no rubs, or gallops Respiratory:  clear to auscultation bilaterally, normal work of breathing GI: soft, nontender, nondistended, + BS MS: no deformity or atrophy; no edema; distal pulses are 2+ in all 4 extremities  Skin: warm and dry, no rash Neuro:  Strength and sensation are intact Psych: euthymic mood, full affect   EKG:  EKG is not ordered today.   Recent Labs: 02/08/2015: BUN 16; Creatinine, Ser 0.76; Hemoglobin 15.2; Platelets 231.0; Potassium 4.2; Sodium  140    Lipid Panel    Component Value Date/Time   CHOL 176 08/07/2013 0839   TRIG 112.0 08/07/2013 0839   HDL 47.50 08/07/2013 0839   CHOLHDL 4 08/07/2013 0839   VLDL 22.4 08/07/2013 0839   LDLCALC 106* 08/07/2013 0839     Wt Readings from Last 3 Encounters:  09/03/15 142 lb 12.8 oz (64.774 kg)  07/19/15 133 lb 14.4 oz (60.737 kg)  07/17/15 138 lb 7.2 oz (62.8 kg)     Other studies Reviewed: Additional studies/ records that were reviewed today include: Office Notes hospital records, procedures and testing..  ASSESSMENT AND PLAN:  1.  HOCM: I reminded the patient that his functional status is better if his heart rate is on the low side. His blood pressure is not currently high  enough to add a beta blocker but he understands this would be the appropriate medication.. We checked orthostatic vital signs which were mildly positive. SBP had only mild changes but HR went from 60->75 when going from lying>standing at 3 minutes.   I recommended that he increase by mouth intake and liberalize salt intake as I felt his blood pressure was a little low for him. Encouraged him to make sure that he does not miss meals, and to make sure that he gets protein at every meal. If this does not help significantly, he is to contact us, to consider further testing with a treadmill at that time.  2. Tachycardia: His device was interrogated. This device is set to track up to a heart rate 200. It does not show any extreme high heart rate episodes. He is pacing and tracking appropriately.  If he has becoming symptomatic when his heart rate is elevated, consider decreasing the high heart rate response. A treadmill may help [place] figure this out.  We also discussed that the shortness of breath may also be a possible anginal equivalent. He does not feel this is so and does not wish to pursue any testing of this possibility at this time.    Current medicines are reviewed at length with the patient today.  The  patient does not have concerns regarding medicines.  The following changes have been made:  no change  Labs/ tests ordered today include:  No orders of the defined types were placed in this encounter.     Disposition:   FU with Dr. Caryl Comes  Signed, Latonda Larrivee, [address]  09/03/2015 9:56 AM    [place] Phone: [phone]; Fax: [phone]  This note was written with the assistance of speech recognition software. Please excuse any transcriptional errors.

## 2015-10-09 ENCOUNTER — Encounter: Payer: Self-pay | Admitting: Internal Medicine

## 2015-10-09 ENCOUNTER — Ambulatory Visit (INDEPENDENT_AMBULATORY_CARE_PROVIDER_SITE_OTHER): Payer: BC Managed Care – PPO | Admitting: Internal Medicine

## 2015-10-09 ENCOUNTER — Encounter: Payer: BC Managed Care – PPO | Admitting: Internal Medicine

## 2015-10-09 VITALS — BP 110/64 | HR 72 | Ht 70.0 in | Wt 147.0 lb

## 2015-10-09 DIAGNOSIS — R0602 Shortness of breath: Secondary | ICD-10-CM | POA: Diagnosis not present

## 2015-10-09 DIAGNOSIS — I442 Atrioventricular block, complete: Secondary | ICD-10-CM

## 2015-10-09 LAB — CUP PACEART INCLINIC DEVICE CHECK
Battery Voltage: 3.04 V
Brady Statistic AS VP Percent: 88.58 %
Date Time Interrogation Session: 20170405105055
Implantable Lead Implant Date: 20170112
Implantable Lead Location: 753859
Implantable Lead Location: 753860
Lead Channel Impedance Value: 418 Ohm
Lead Channel Impedance Value: 475 Ohm
Lead Channel Impedance Value: 551 Ohm
Lead Channel Pacing Threshold Pulse Width: 0.4 ms
Lead Channel Setting Pacing Pulse Width: 0.4 ms
MDC IDC LEAD IMPLANT DT: 20090608
MDC IDC MSMT BATTERY REMAINING LONGEVITY: 116 mo
MDC IDC MSMT LEADCHNL RA PACING THRESHOLD AMPLITUDE: 0.5 V
MDC IDC MSMT LEADCHNL RA PACING THRESHOLD PULSEWIDTH: 0.4 ms
MDC IDC MSMT LEADCHNL RA SENSING INTR AMPL: 2.6 mV
MDC IDC MSMT LEADCHNL RV IMPEDANCE VALUE: 437 Ohm
MDC IDC MSMT LEADCHNL RV PACING THRESHOLD AMPLITUDE: 1 V
MDC IDC SET LEADCHNL RA PACING AMPLITUDE: 2 V
MDC IDC SET LEADCHNL RV PACING AMPLITUDE: 2.5 V
MDC IDC SET LEADCHNL RV SENSING SENSITIVITY: 5.6 mV
MDC IDC STAT BRADY AP VP PERCENT: 11.41 %
MDC IDC STAT BRADY AP VS PERCENT: 0 %
MDC IDC STAT BRADY AS VS PERCENT: 0 %
MDC IDC STAT BRADY RA PERCENT PACED: 11.41 %
MDC IDC STAT BRADY RV PERCENT PACED: 100 %

## 2015-10-09 NOTE — Patient Instructions (Addendum)
Medication Instructions:  Your physician recommends that you continue on your current medications as directed. Please refer to the Current Medication list given to you today.   Labwork: None ordered   Testing/Procedures: Your physician has requested that you have an echocardiogram. Echocardiography is a painless test that uses sound waves to create images of your heart. It provides your doctor with information about the size and shape of your heart and how well your heart's chambers and valves are working. This procedure takes approximately one hour. There are no restrictions for this procedure.  Your physician has recommended that you have a cardiopulmonary stress test (CPX). CPX testing is a non-invasive measurement of heart and lung function. It replaces a traditional treadmill stress test. This type of test provides a tremendous amount of information that relates not only to your present condition but also for future outcomes. This test combines measurements of you ventilation, respiratory gas exchange in the lungs, electrocardiogram (EKG), blood pressure and physical response before, during, and following an exercise protocol.  A chest x-ray takes a picture of the organs and structures inside the chest, including the heart, lungs, and blood vessels. This test can show several things, including, whether the heart is enlarges; whether fluid is building up in the lungs; and whether pacemaker / defibrillator leads are still in place.    Follow-Up:  Your physician recommends that you schedule a follow-up appointment in: 4 weeks wit Dr Caryl Comes  Your physician wants you to follow-up in: 9 months with Dr Hartford Poli will receive a reminder letter in the mail two months in advance. If you don't receive a letter, please call our office to schedule the follow-up appointment.  Remote monitoring is used to monitor your Pacemaker  from home. This monitoring reduces the number of office visits required to  check your device to one time per year. It allows [place] to keep an eye on the functioning of your device to ensure it is working properly. You are scheduled for a device check from home on 01/08/16. You may send your transmission at any time that day. If you have a wireless device, the transmission will be sent automatically. After your physician reviews your transmission, you will receive a postcard with your next transmission date.     Any Other Special Instructions Will Be Listed Below (If Applicable).     If you need a refill on your cardiac medications before your next appointment, please call your pharmacy.

## 2015-10-09 NOTE — Progress Notes (Signed)
HPI Mr. Bogie returns today for followup. He is a pleasant 60 yo man with complete heart block who was found to have a broken atrial lead and has been seen in the past regarding lead extraction. He underwent lead extraction with insertion of a new atrial lead several months ago. Over the past few weeks he has noted increasing sob. No edema. No orthopnea but he has had dyspnea with exertion. No chest pain. Marland KitchenNo Known Allergies   Current Outpatient Prescriptions  Medication Sig Dispense Refill  . Ascorbic Acid (VITAMIN C) 500 MG tablet Take 500 mg by mouth daily as needed (Vitamin c supplement).     . cholecalciferol (VITAMIN D) 1000 UNITS tablet Take 1,000 Units by mouth daily. Take 5,000 units daily    . Dimethyl Fumarate (TECFIDERA) 240 MG CPDR Take 240 mg by mouth 2 (two) times daily.    . Multiple Vitamin (MULTIVITAMIN) tablet Take 1 tablet by mouth daily.      No current facility-administered medications for this visit.     Past Medical History  Diagnosis Date  . Multiple sclerosis ([place]) 1998    Sxs started 1998 with L body numbness. MRI c/w MS. Started Avonex. Saw Dr [name] at [facility] and switched to Betaseron and was on for 5 yrs but developed depression and site rxns and D/C'd 2006. While on Betaseron, occassionally required solumedrol for flares.  . Complete heart block [facility]) June 2009    a. Presented with hypotension/pulm edema 12/2007 when HOCM was also discovered. s/p dual chamber MDT pacemaker. Thallium stest 7/09 negative for infiltrative disease or ischemia.  Followed Dr [name]  Marland Kitchen HOCM (hypertrophic obstructive cardiomyopathy) ([place]) 2009    Discovered by ECHO when he developed CHB. Mgmt Dr Caryl Comes  and Dr [name] at [facility]. Has outflow obstruction on ECHO 2011  . Right bundle branch block   . Wenckebach     a. Decreased ex tolerance 2010/2012 - underwent stress echo to look @ effects of MV/LVOT - nothing noted; although pt had high rate Wenckebach behavior with associated ex  intol. Pacer reprogrammed with improvement in sx.  . SOB (shortness of breath)     a. PFTs 09/2010 essentially normal per pulm note (mild obst defect). b. 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  . Prostate enlargement   . Heart murmur     ROS:   All systems reviewed and negative except as noted in the HPI.   Past Surgical History  Procedure Laterality Date  . Leg tendon surgery  2008    Os peroneus resection and peroneal tendon repair  . Pacemaker insertion  12/2007    Complete heart block  . Left heart catheterization with coronary angiogram N/A 08/22/2013    Procedure: LEFT HEART CATHETERIZATION WITH CORONARY ANGIOGRAM;  Surgeon: [name], MD;  Location: [facility] CATH LAB;  Service: Cardiovascular;  Laterality: N/A;  . Pace maker    . Peripheral vascular catheterization N/A 02/12/2015    Procedure: Venogram;  Surgeon: [name], MD;  Location: [place] CV LAB;  Service: Cardiovascular;  Laterality: N/A;  . Pacemaker lead removal Left 07/18/2015    Procedure: ATRIAL PACEMAKER LEAD EXTRACTION; ATRIAL PACEMAKER LEAD INSERTION; GENERATOR REPLACEMENT.;  Surgeon: [name], MD;  Location: [place];  Service: Cardiovascular;  Laterality: Left;  [name] to back up  . Tee without cardioversion N/A 07/18/2015    Procedure: TRANSESOPHAGEAL ECHOCARDIOGRAM (TEE);  Surgeon: [name], MD;  Location: [place];  Service:  Cardiovascular;  Laterality: N/A;     Family History  Problem Relation Age of Onset  . Heart disease Neg Hx   . Colon polyps Father   . Hypertension Father   . Diabetes Paternal Grandmother      Social History   Social History  . Marital Status: Married    Spouse Name: N/A  . Number of Children: 0  . Years of Education: N/A   Occupational History  . professor [employer]   Social History Main Topics  . Smoking status: Never Smoker   . Smokeless tobacco: Never Used  . Alcohol Use: No  . Drug Use: No  . Sexual Activity: Not on file     Other Topics Concern  . 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     BP 110/64 mmHg  Pulse 72  Ht 5\' 10"  (1.778 m)  Wt 147 lb (66.679 kg)  BMI 21.09 kg/m2  Physical Exam:  Well appearing 60 yo man, NAD HEENT: Unremarkable Neck:  6 cm JVD, no thyromegally Lymphatics:  No adenopathy Back:  No CVA tenderness Lungs:  Clear with no wheezes. Skin over the left chest has evidence of enlarged veins. HEART:  Regular rate rhythm, no murmurs, no rubs, no clicks Abd:  soft, positive bowel sounds, no organomegally, no rebound, no guarding Ext:  2 plus pulses, no edema, no cyanosis, no clubbing Skin:  No rashes no nodules Neuro:  CN II through XII intact, motor grossly intact  PPM interrogation - normal device function. No atrial arrhythmias   Assess/Plan: 1. Dyspnea - this is his main problem. The etiology is unclear. I have recommended he undergo a CXR, 2D echo and cardiopulmonary stress test unless there is an obvious cause by cxr or echo. He will followup with Dr. Renaldo Reel. 2. HCM - he will continue his current meds. 3. CHB - he is asymptomatic from this and is pacing 99% of the time. 4. PPM - his Medtronic DDD PM is working normally. Will follow.  [name].D.

## 2015-10-10 ENCOUNTER — Ambulatory Visit
Admission: RE | Admit: 2015-10-10 | Discharge: 2015-10-10 | Disposition: A | Discharge status: Home / Self Care | Payer: BC Managed Care – PPO | Source: Ambulatory Visit | Attending: Internal Medicine | Admitting: Internal Medicine

## 2015-10-10 DIAGNOSIS — I442 Atrioventricular block, complete: Secondary | ICD-10-CM

## 2015-10-10 DIAGNOSIS — R0602 Shortness of breath: Secondary | ICD-10-CM

## 2015-10-29 ENCOUNTER — Ambulatory Visit (HOSPITAL_COMMUNITY): Payer: BC Managed Care – PPO | Attending: Internal Medicine

## 2015-10-29 ENCOUNTER — Ambulatory Visit (HOSPITAL_BASED_OUTPATIENT_CLINIC_OR_DEPARTMENT_OTHER): Payer: BC Managed Care – PPO

## 2015-10-29 ENCOUNTER — Other Ambulatory Visit: Payer: Self-pay

## 2015-10-29 DIAGNOSIS — R0602 Shortness of breath: Secondary | ICD-10-CM | POA: Insufficient documentation

## 2015-10-29 DIAGNOSIS — I4510 Unspecified right bundle-branch block: Secondary | ICD-10-CM | POA: Insufficient documentation

## 2015-10-29 DIAGNOSIS — G35 Multiple sclerosis: Secondary | ICD-10-CM | POA: Insufficient documentation

## 2015-10-29 DIAGNOSIS — Z950 Presence of cardiac pacemaker: Secondary | ICD-10-CM | POA: Diagnosis not present

## 2015-10-29 DIAGNOSIS — I421 Obstructive hypertrophic cardiomyopathy: Secondary | ICD-10-CM | POA: Diagnosis not present

## 2015-10-29 DIAGNOSIS — I442 Atrioventricular block, complete: Secondary | ICD-10-CM | POA: Diagnosis not present

## 2015-10-30 DIAGNOSIS — R0602 Shortness of breath: Secondary | ICD-10-CM | POA: Diagnosis not present

## 2015-11-14 ENCOUNTER — Encounter: Payer: Self-pay | Admitting: Internal Medicine

## 2015-11-14 ENCOUNTER — Ambulatory Visit (INDEPENDENT_AMBULATORY_CARE_PROVIDER_SITE_OTHER): Payer: BC Managed Care – PPO | Admitting: Internal Medicine

## 2015-11-14 VITALS — BP 133/67 | HR 75 | Ht 70.0 in | Wt 146.8 lb

## 2015-11-14 DIAGNOSIS — R0609 Other forms of dyspnea: Secondary | ICD-10-CM

## 2015-11-14 DIAGNOSIS — I442 Atrioventricular block, complete: Secondary | ICD-10-CM | POA: Diagnosis not present

## 2015-11-14 DIAGNOSIS — Z950 Presence of cardiac pacemaker: Secondary | ICD-10-CM

## 2015-11-14 DIAGNOSIS — I422 Other hypertrophic cardiomyopathy: Secondary | ICD-10-CM

## 2015-11-14 NOTE — Patient Instructions (Signed)
Medication Instructions:  Your physician recommends that you continue on your current medications as directed. Please refer to the Current Medication list given to you today.   Labwork: None ordered   Testing/Procedures: None ordered   Follow-Up: Your physician wants you to follow-up in: 6 months with Dr Gari Crown will receive a reminder letter in the mail two months in advance. If you don't receive a letter, please call our office to schedule the follow-up appointment.  Remote monitoring is used to monitor your Pacemaker  from home. This monitoring reduces the number of office visits required to check your device to one time per year. It allows [place] to keep an eye on the functioning of your device to ensure it is working properly. You are scheduled for a device check from home on 02/13/16. You may send your transmission at any time that day. If you have a wireless device, the transmission will be sent automatically. After your physician reviews your transmission, you will receive a postcard with your next transmission date.     Any Other Special Instructions Will Be Listed Below (If Applicable).     If you need a refill on your cardiac medications before your next appointment, please call your pharmacy.

## 2015-11-14 NOTE — Progress Notes (Signed)
Mostly inferior skf      Patient Care Team: Harlan Stains, MD as PCP - General (Family Medicine) Gatha Mayer, MD as Consulting Physician (Gastroenterology) [name], MD as Consulting Physician (Cardiology)   HPI  Rodney Adkins is a 60 y.o. male Seen in followup for HTN with complete heart block status post pacing. He has been seen again recently because of exercise intolerance previously had been associated with upper rate Wenckebach behavior. Because of episodes of chest pain he underwent catheterization demonstrating no obstructive coronary disease but there were gradients.LVEDP>>5  He had a broken atrial lead. He saw both Dr. Elliot Cousin and [facility] and elected to have his extraction done here. It was done by Dr. Lovena Le 1/17  He continues to struggle with dyspnea.  He was soon admitted for cardiopulmonary stress testing which was reviewed. He describes orthostatic hypotensive blood pressure responses; heart rate peaked 158; peak blood pressures recorded 140.     It is also his wifes impression that sometimes even at rest tehre is a very amoutn of energy ( see bleow)   Past Medical History  Diagnosis Date  . Multiple sclerosis ([place]) 1998    Sxs started 1998 with L body numbness. MRI c/w MS. Started Avonex. Saw Dr [name] at [facility] and switched to Betaseron and was on for 5 yrs but developed depression and site rxns and D/C'd 2006. While on Betaseron, occassionally required solumedrol for flares.  . Complete heart block [facility]) June 2009    a. Presented with hypotension/pulm edema 12/2007 when HOCM was also discovered. s/p dual chamber MDT pacemaker. Thallium stest 7/09 negative for infiltrative disease or ischemia.  Followed Dr [name]  Marland Kitchen HOCM (hypertrophic obstructive cardiomyopathy) ([place]) 2009    Discovered by ECHO when he developed CHB. Mgmt Dr Caryl Comes  and Dr [name] at [facility]. Has outflow obstruction on ECHO 2011  . Right bundle branch block   . Wenckebach     a. Decreased ex tolerance  2010/2012 - underwent stress echo to look @ effects of MV/LVOT - nothing noted; although pt had high rate Wenckebach behavior with associated ex intol. Pacer reprogrammed with improvement in sx.  . SOB (shortness of breath)     a. PFTs 09/2010 essentially normal per pulm note (mild obst defect). b. 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  . Prostate enlargement   . Heart murmur     Past Surgical History  Procedure Laterality Date  . Leg tendon surgery  2008    Os peroneus resection and peroneal tendon repair  . Pacemaker insertion  12/2007    Complete heart block  . Left heart catheterization with coronary angiogram N/A 08/22/2013    Procedure: LEFT HEART CATHETERIZATION WITH CORONARY ANGIOGRAM;  Surgeon: [name], MD;  Location: [facility] CATH LAB;  Service: Cardiovascular;  Laterality: N/A;  . Pace maker    . Peripheral vascular catheterization N/A 02/12/2015    Procedure: Venogram;  Surgeon: [name], MD;  Location: [place] CV LAB;  Service: Cardiovascular;  Laterality: N/A;  . Pacemaker lead removal Left 07/18/2015    Procedure: ATRIAL PACEMAKER LEAD EXTRACTION; ATRIAL PACEMAKER LEAD INSERTION; GENERATOR REPLACEMENT.;  Surgeon: [name], MD;  Location: [place];  Service: Cardiovascular;  Laterality: Left;  [name] to back up  . Tee without cardioversion N/A 07/18/2015    Procedure: TRANSESOPHAGEAL ECHOCARDIOGRAM (TEE);  Surgeon: [name], MD;  Location: [facility] OR;  Service: Cardiovascular;  Laterality: N/A;    Current Outpatient Prescriptions  Medication Sig Dispense Refill  . Ascorbic Acid (VITAMIN C) 500 MG tablet Take 500 mg by mouth daily as needed (Vitamin c supplement).     . cholecalciferol (VITAMIN D) 1000 UNITS tablet Take 1,000 Units by mouth daily. Take 5,000 units daily    . Dimethyl Fumarate (TECFIDERA) 240 MG CPDR Take 240 mg by mouth 2 (two) times daily.    . Multiple Vitamin (MULTIVITAMIN) tablet Take 1 tablet by mouth daily.      No current  facility-administered medications for this visit.    No Known Allergies  Review of Systems negative except from HPI and PMH  Physical Exam BP 133/67 mmHg  Pulse 75  Ht 5\' 10"  (1.778 m)  Wt 146 lb 12.8 oz (66.588 kg)  BMI 21.06 kg/m2 Well developed and well nourished in no acute distress HENT normal E scleral and icterus clear Neck Supple Some tenderness of L IJ JVP flat; carotids brisk and full Clear to ausculation Device pocket well healed; without hematoma or erythema.  There is no tethering   Regular rate and rhythm, 2/6 murmur enhances With Valsalva  Soft with active bowel sounds No clubbing cyanosis none Edema Alert and oriented, grossly normal motor and sensory function Skin Warm and Dry   ECG ordered today demonstrates sinus rhythm at 62 with a synchronous pacing  Assessment and  Plan  HCM  Complete Heart Block  Pacer Medtronic The patient's device was interrogated.  The information was reviewed. No changes were made in the programming.     Dyspnea on exertion  Exercise assoc hypotension   The patient's continues to struggle with dyspnea. The differential diagnosis would include exercise associated obstruction, inappropriate vasomotor tone with hypotension associated with his HCM, conditioning, other cardiac limitations. I will review his CPX with Dr. Reine Just; I am struck by the description but are quite sure how to interpret. Volume expansion in the context of his chronic hypotension though seems a reasonable start so will add salt and water.  Given the fact that he has evidence of orthostatic intolerance with a heart rate increase of 30+ beats per minute, I am loath to consider the use of disopyramide and/or a beta blocker or a calcium blocker although she does have outflow obstruction this dynamic this could be helpful. Prior medication initiation I will be in touch with Dr. Farrel Conners Duke who has been helpful with him before  We have also reprogrammed the AV delay to  make a dynamic to see if at higher heart rates that further premature stimulation of the septum doesn't help decrease his outflow obstruction that we are hypothesizing is ienhanced with exercise

## 2015-11-19 LAB — CUP PACEART INCLINIC DEVICE CHECK
Battery Remaining Longevity: 110 mo
Brady Statistic AP VP Percent: 14.84 %
Brady Statistic AP VS Percent: 0 %
Brady Statistic AS VS Percent: 0 %
Brady Statistic RV Percent Paced: 100 %
Date Time Interrogation Session: 20170511184257
Implantable Lead Implant Date: 20170112
Implantable Lead Location: 753859
Implantable Lead Location: 753860
Implantable Lead Model: 5076
Lead Channel Impedance Value: 418 Ohm
Lead Channel Impedance Value: 456 Ohm
Lead Channel Pacing Threshold Amplitude: 0.375 V
Lead Channel Pacing Threshold Amplitude: 0.875 V
Lead Channel Pacing Threshold Pulse Width: 0.4 ms
Lead Channel Pacing Threshold Pulse Width: 0.4 ms
Lead Channel Setting Pacing Amplitude: 2.5 V
Lead Channel Setting Pacing Pulse Width: 0.4 ms
MDC IDC LEAD IMPLANT DT: 20090608
MDC IDC MSMT BATTERY VOLTAGE: 3.03 V
MDC IDC MSMT LEADCHNL RA IMPEDANCE VALUE: 399 Ohm
MDC IDC MSMT LEADCHNL RA SENSING INTR AMPL: 1.875 mV
MDC IDC MSMT LEADCHNL RV IMPEDANCE VALUE: 513 Ohm
MDC IDC SET LEADCHNL RA PACING AMPLITUDE: 2 V
MDC IDC SET LEADCHNL RV SENSING SENSITIVITY: 5.6 mV
MDC IDC STAT BRADY AS VP PERCENT: 85.16 %
MDC IDC STAT BRADY RA PERCENT PACED: 14.84 %

## 2015-12-28 ENCOUNTER — Encounter: Payer: Self-pay | Admitting: Internal Medicine

## 2016-01-01 ENCOUNTER — Ambulatory Visit (INDEPENDENT_AMBULATORY_CARE_PROVIDER_SITE_OTHER): Payer: BC Managed Care – PPO | Admitting: Family Medicine

## 2016-01-01 ENCOUNTER — Ambulatory Visit (INDEPENDENT_AMBULATORY_CARE_PROVIDER_SITE_OTHER): Payer: BC Managed Care – PPO

## 2016-01-01 VITALS — BP 112/62 | HR 92 | Temp 100.1°F | Resp 17 | Ht 70.0 in | Wt 143.0 lb

## 2016-01-01 DIAGNOSIS — R29818 Other symptoms and signs involving the nervous system: Secondary | ICD-10-CM

## 2016-01-01 DIAGNOSIS — N319 Neuromuscular dysfunction of bladder, unspecified: Secondary | ICD-10-CM

## 2016-01-01 DIAGNOSIS — R059 Cough, unspecified: Secondary | ICD-10-CM

## 2016-01-01 DIAGNOSIS — R509 Fever, unspecified: Secondary | ICD-10-CM

## 2016-01-01 DIAGNOSIS — R319 Hematuria, unspecified: Secondary | ICD-10-CM

## 2016-01-01 DIAGNOSIS — R05 Cough: Secondary | ICD-10-CM

## 2016-01-01 DIAGNOSIS — R2689 Other abnormalities of gait and mobility: Secondary | ICD-10-CM

## 2016-01-01 LAB — POCT CBC
Granulocyte percent: 89.7 %G — AB (ref 37–80)
HCT, POC: 38.5 % — AB (ref 43.5–53.7)
HEMOGLOBIN: 13.9 g/dL — AB (ref 14.1–18.1)
Lymph, poc: 1 (ref 0.6–3.4)
MCH: 31.6 pg — AB (ref 27–31.2)
MCHC: 36 g/dL — AB (ref 31.8–35.4)
MCV: 87.9 fL (ref 80–97)
MID (cbc): 0.7 (ref 0–0.9)
MPV: 7.7 fL (ref 0–99.8)
PLATELET COUNT, POC: 173 10*3/uL (ref 142–424)
POC Granulocyte: 14.8 — AB (ref 2–6.9)
POC LYMPH PERCENT: 5.9 %L — AB (ref 10–50)
POC MID %: 4.4 %M (ref 0–12)
RBC: 4.38 M/uL — AB (ref 4.69–6.13)
RDW, POC: 12.5 %
WBC: 16.5 10*3/uL — AB (ref 4.6–10.2)

## 2016-01-01 LAB — POC MICROSCOPIC URINALYSIS (UMFC)

## 2016-01-01 LAB — POCT URINALYSIS DIP (MANUAL ENTRY)
GLUCOSE UA: NEGATIVE
LEUKOCYTES UA: NEGATIVE
Nitrite, UA: NEGATIVE
Protein Ur, POC: 100 — AB
SPEC GRAV UA: 1.02
UROBILINOGEN UA: 1
pH, UA: 5

## 2016-01-01 MED ORDER — CEFTRIAXONE SODIUM 1 G IJ SOLR
1.0000 g | Freq: Once | INTRAMUSCULAR | Status: AC
  Administered 2016-01-01: 1 g via INTRAMUSCULAR

## 2016-01-01 MED ORDER — LEVOFLOXACIN 500 MG PO TABS: 500.0000 mg | ORAL_TABLET | Freq: Every day | ORAL | Status: DC

## 2016-01-01 NOTE — Patient Instructions (Addendum)
     IF you received an x-ray today, you will receive an invoice from [facility]. Please contact [facility] at [phone] with questions or concerns regarding your invoice.   IF you received labwork today, you will receive an invoice from [employer]. Please contact [facility] at [phone] with questions or concerns regarding your invoice.   Our billing staff will not be able to assist you with questions regarding bills from these companies.  You will be contacted with the lab results as soon as they are available. The fastest way to get your results is to activate your My Chart account. Instructions are located on the last page of this paperwork. If you have not heard from [place] regarding the results in 2 weeks, please contact this office.     Even though your x-ray did not indicate a pneumonia, I'm suspicious for an early pneumonia as a cause of your symptoms and elevated blood count, as well as fever. I will check a prostate test and urine culture to look into urinary tract or prostate infection as a cause of your symptoms, but for now we'll start an antibiotic that should cover both. Tylenol is okay for fever, start the first dose of Levaquin tonight, and follow-up with Dr. Tamala Julian tomorrow. As we discussed, if you are worsening overnight, including more shaking chills, worsening fever, shortness of breath, or otherwise feeling worse, be seen in the emergency room.

## 2016-01-01 NOTE — Progress Notes (Addendum)
By signing my name below, I, [name], attest that this documentation has been prepared under the direction and in the presence of [name], MD.  Electronically Signed: [name], Medical Scribe. 01/01/2016. 7:23 PM.  Subjective:    Patient ID: Rodney Adkins, male    DOB: January 03, 1956, 60 y.o.   MRN: [id number]  HPI Chief Complaint  Patient presents with  . Fever    x 2 days    HPI Comments: Rodney Adkins is a 60 y.o. male who presents to the [facility] complaining of fever onset 2 days. Pt has a PMHx of multiple problems including a heart block with a pace maker, hypertropic obstructive cardiomyopathy, Nephrogenic bladder, and MS. He has been evaluated by cardiology for dyspnea. Note reviewed from May 11th from Dr. [name]   Pt had a fever of 102 2 days ago. Pt states he was hot and sweaty. He became chilled when he wiped the sweat off of him. Pt states his balance is a little bit more often than usual, unsteady. Pt has to hold on to something when he gets out of bed. Pt states this is worse than he's used to with his MS. Pt states his cough has got worse in the last 24 hours. Pt took Tylenol for his symptoms.   Pt reports hematuria with self bladder catherterization for his neurogenic bladder previously, but was evaluated by urology and noted to have bladder stones. . Pt has not had a UTIs in the past. Pt had gross hematuria a few weeks ago with bladder stones. Pt denies sick contacts, , photophobia, phonophobia, neck pain, and neck stiffness.  Patient Active Problem List   Diagnosis Date Noted  . Fractured atrial pacemaker lead wire 07/18/2015  . 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 Chest pain 08/02/2013  . SOB (shortness of breath) 08/02/2013  . PVC (premature ventricular contraction) 02/13/2013  . Chest pain on exertion 02/25/2012  . Pacemaker-dual-chamber Medtronic 04/29/2011  . Change in bowel habits 01/22/2011  . Weight loss 01/22/2011    . COUGH 09/01/2010  . Multiple sclerosis ([place])   . HOCM (hypertrophic obstructive cardiomyopathy) ([place])   . Right bundle branch block   . Complete heart block ([place]) 12/05/2007   Past Medical History  Diagnosis Date  . Multiple sclerosis ([place]) 1998    Sxs started 1998 with L body numbness. MRI c/w MS. Started Avonex. Saw Dr [name] at [facility] and switched to Betaseron and was on for 5 yrs but developed depression and site rxns and D/C'd 2006. While on Betaseron, occassionally required solumedrol for flares.  . Complete heart block [facility]) June 2009    a. Presented with hypotension/pulm edema 12/2007 when HOCM was also discovered. s/p dual chamber MDT pacemaker. Thallium stest 7/09 negative for infiltrative disease or ischemia.  Followed Dr [name]  Marland Kitchen HOCM (hypertrophic obstructive cardiomyopathy) ([place]) 2009    Discovered by ECHO when he developed CHB. Mgmt Dr Caryl Comes  and Dr [name] at [facility]. Has outflow obstruction on ECHO 2011  . Right bundle branch block   . Wenckebach     a. Decreased ex tolerance 2010/2012 - underwent stress echo to look @ effects of MV/LVOT - nothing noted; although pt had high rate Wenckebach behavior with associated ex intol. Pacer reprogrammed with improvement in sx.  . SOB (shortness of breath)     a. PFTs 09/2010 essentially normal per pulm note (mild obst defect). b. 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  . Prostate  enlargement   . Heart murmur    Past Surgical History  Procedure Laterality Date  . Leg tendon surgery  2008    Os peroneus resection and peroneal tendon repair  . Pacemaker insertion  12/2007    Complete heart block  . Left heart catheterization with coronary angiogram N/A 08/22/2013    Procedure: LEFT HEART CATHETERIZATION WITH CORONARY ANGIOGRAM;  Surgeon: [name], MD;  Location: [facility] CATH LAB;  Service: Cardiovascular;  Laterality: N/A;  . Pace maker    . Peripheral vascular catheterization N/A 02/12/2015    Procedure: Venogram;   Surgeon: [name], MD;  Location: [place] CV LAB;  Service: Cardiovascular;  Laterality: N/A;  . Pacemaker lead removal Left 07/18/2015    Procedure: ATRIAL PACEMAKER LEAD EXTRACTION; ATRIAL PACEMAKER LEAD INSERTION; GENERATOR REPLACEMENT.;  Surgeon: [name], MD;  Location: [place];  Service: Cardiovascular;  Laterality: Left;  [name] to back up  . Tee without cardioversion N/A 07/18/2015    Procedure: TRANSESOPHAGEAL ECHOCARDIOGRAM (TEE);  Surgeon: [name], MD;  [facility] OR;  Service: Cardiovascular;  Laterality: N/A;   No Known Allergies Prior to Admission medications   Medication Sig Start Date End Date Taking? Authorizing Provider  Ascorbic Acid (VITAMIN C) 500 MG tablet Take 500 mg by mouth daily as needed (Vitamin c supplement).    Yes Historical Provider, MD  cholecalciferol (VITAMIN D) 1000 UNITS tablet Take 1,000 Units by mouth daily. Take 5,000 units daily   Yes Historical Provider, MD  Dimethyl Fumarate (TECFIDERA) 240 MG CPDR Take 240 mg by mouth 2 (two) times daily.   Yes Historical Provider, MD  Multiple Vitamin (MULTIVITAMIN) tablet Take 1 tablet by mouth daily.    Yes Historical Provider, MD  finasteride (PROSCAR) 5 MG tablet  12/20/15   Historical Provider, MD  tamsulosin (FLOMAX) 0.4 MG CAPS capsule  12/20/15   Historical Provider, MD   Social History   Social History  . Marital Status: Married    Spouse Name: N/A  . Number of Children: 0  . Years of Education: N/A   Occupational History  . professor [employer]   Social History Main Topics  . Smoking status: Never Smoker   . Smokeless tobacco: Never Used  . Alcohol Use: No  . Drug Use: No  . Sexual Activity: Not on file   Other Topics Concern  . 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   Review of Systems  Constitutional: Positive for fever, chills, diaphoresis and fatigue.  Eyes: Negative for photophobia.  Respiratory: Positive  for cough and wheezing.   Gastrointestinal: Negative for abdominal pain.  Genitourinary: Positive for hematuria.  Musculoskeletal: Negative for neck pain and neck stiffness.  Neurological: Positive for tremors and headaches. Negative for dizziness.   Objective:  BP 112/62 mmHg  Pulse 92  Temp(Src) 100.1 F (37.8 C) (Oral)  Resp 17  Ht 5\' 10"  (1.778 m)  Wt 143 lb (64.864 kg)  BMI 20.52 kg/m2  SpO2 96%  Physical Exam  Constitutional: He appears well-developed and well-nourished. No distress.  HENT:  Head: Normocephalic and atraumatic.  Eyes: Conjunctivae are normal.  Neck: Neck supple.  Cardiovascular: Normal rate.   Pulmonary/Chest: Effort normal. He has rhonchi (few) in the right lower field.  Abdominal: There is no tenderness.  Neurological: He is alert.  Skin: Skin is warm and dry.  Psychiatric: He has a normal mood and affect. His  behavior is normal.  Nursing note and vitals reviewed.  Results for orders placed or performed in visit on 01/01/16  POCT CBC  Result Value Ref Range   WBC 16.5 (A) 4.6 - 10.2 K/uL   Lymph, poc 1.0 0.6 - 3.4   POC LYMPH PERCENT 5.9 (A) 10 - 50 %L   MID (cbc) 0.7 0 - 0.9   POC MID % 4.4 0 - 12 %M   POC Granulocyte 14.8 (A) 2 - 6.9   Granulocyte percent 89.7 (A) 37 - 80 %G   RBC 4.38 (A) 4.69 - 6.13 M/uL   Hemoglobin 13.9 (A) 14.1 - 18.1 g/dL   HCT, POC 38.5 (A) 43.5 - 53.7 %   MCV 87.9 80 - 97 fL   MCH, POC 31.6 (A) 27 - 31.2 pg   MCHC 36.0 (A) 31.8 - 35.4 g/dL   RDW, POC 12.5 %   Platelet Count, POC 173 142 - 424 K/uL   MPV 7.7 0 - 99.8 fL   Dg Chest 2 View  01/01/2016  CLINICAL DATA:  Cough, fever and chills for 2 days. EXAM: CHEST  2 VIEW COMPARISON:  October 10, 2015 FINDINGS: The heart size and mediastinal contours are stable. Dual lead cardiac pacemaker is unchanged. There is no focal infiltrate, pulmonary edema, or pleural effusion. The visualized skeletal structures are unremarkable. IMPRESSION: No active cardiopulmonary disease.  Electronically Signed   By: Abelardo Diesel M.D.   On: 01/01/2016 20:05   Assessment & Plan:   Rodney Adkins is a 60 y.o. male Cough - Plan: POCT CBC, DG Chest 2 View, cefTRIAXone (ROCEPHIN) injection 1 g, levofloxacin (LEVAQUIN) 500 MG tablet, DISCONTINUED: levofloxacin (LEVAQUIN) 500 MG tablet Fever, unspecified - Plan: POCT CBC, DG Chest 2 View, POCT Microscopic Urinalysis ([facility]), POCT urinalysis dipstick, cefTRIAXone (ROCEPHIN) injection 1 g, PSA  - clinically appears to have early pneumonia, but no focal infiltrate noted on CXR. Discussed ER eval, but chose trial of outpatient Rx with recheck in 1 day, O/n ER precautions discussed.   -rocephin 1 gram in office, levaquin 500mg  QD for 10 days - SED, and tendon risks discussed   Neurogenic bladder - Plan: POCT Microscopic Urinalysis ([facility]), POCT urinalysis dipstick, Urine culture Hematuria - Plan: cefTRIAXone (ROCEPHIN) injection 1 g, PSA, Urine culture, levofloxacin (LEVAQUIN) 500 MG tablet, DISCONTINUED: levofloxacin (LEVAQUIN) 500 MG tablet  -hx of hematuria, no new urinary sx's.  Will check PSA, urine cx, but will be on levaquin which would typically cover infection in either area.   Balance problem  -ambulated without apparent difficulty in office and no focal neuro findings. Hx of MS. May be related to current illness and flair.   -abx and symptomatic care, rtc for recheck in 1 day, ER precautions overnight discussed    Meds ordered this encounter  Medications  . finasteride (PROSCAR) 5 MG tablet    Sig:   . tamsulosin (FLOMAX) 0.4 MG CAPS capsule    Sig:   . cefTRIAXone (ROCEPHIN) injection 1 g    Sig:     Order Specific Question:  Antibiotic Indication:    Answer:  Other Indication (list below)  . DISCONTD: levofloxacin (LEVAQUIN) 500 MG tablet    Sig: Take 1 tablet (500 mg total) by mouth daily.    Dispense:  10 tablet    Refill:  0  . levofloxacin (LEVAQUIN) 500 MG tablet    Sig: Take 1 tablet (500 mg total) by mouth  daily.    Dispense:  10 tablet  Refill:  0   Patient Instructions       IF you received an x-ray today, you will receive an invoice from [facility]. Please contact [facility] at [phone] with questions or concerns regarding your invoice.   IF you received labwork today, you will receive an invoice from [employer]. Please contact [facility] at [phone] with questions or concerns regarding your invoice.   Our billing staff will not be able to assist you with questions regarding bills from these companies.  You will be contacted with the lab results as soon as they are available. The fastest way to get your results is to activate your My Chart account. Instructions are located on the last page of this paperwork. If you have not heard from [place] regarding the results in 2 weeks, please contact this office.     Even though your x-ray did not indicate a pneumonia, I'm suspicious for an early pneumonia as a cause of your symptoms and elevated blood count, as well as fever. I will check a prostate test and urine culture to look into urinary tract or prostate infection as a cause of your symptoms, but for now we'll start an antibiotic that should cover both. Tylenol is okay for fever, start the first dose of Levaquin tonight, and follow-up with Dr. Tamala Julian tomorrow. As we discussed, if you are worsening overnight, including more shaking chills, worsening fever, shortness of breath, or otherwise feeling worse, be seen in the emergency room.    I personally performed the services described in this documentation, which was scribed in my presence. The recorded information has been reviewed and considered, and addended by me as needed.   Signed,   [name], MD [facility].  01/01/2016 8:46 PM

## 2016-01-02 ENCOUNTER — Encounter: Payer: Self-pay | Admitting: Internal Medicine

## 2016-01-02 ENCOUNTER — Encounter: Payer: BC Managed Care – PPO | Admitting: Family Medicine

## 2016-01-02 ENCOUNTER — Ambulatory Visit (INDEPENDENT_AMBULATORY_CARE_PROVIDER_SITE_OTHER): Payer: BC Managed Care – PPO | Admitting: Family Medicine

## 2016-01-02 VITALS — BP 120/70 | HR 80 | Temp 97.6°F | Resp 16 | Ht 70.0 in | Wt 143.0 lb

## 2016-01-02 DIAGNOSIS — R509 Fever, unspecified: Secondary | ICD-10-CM

## 2016-01-02 DIAGNOSIS — I421 Obstructive hypertrophic cardiomyopathy: Secondary | ICD-10-CM | POA: Diagnosis not present

## 2016-01-02 DIAGNOSIS — Z950 Presence of cardiac pacemaker: Secondary | ICD-10-CM

## 2016-01-02 DIAGNOSIS — I442 Atrioventricular block, complete: Secondary | ICD-10-CM

## 2016-01-02 DIAGNOSIS — R05 Cough: Secondary | ICD-10-CM | POA: Diagnosis not present

## 2016-01-02 DIAGNOSIS — R059 Cough, unspecified: Secondary | ICD-10-CM

## 2016-01-02 DIAGNOSIS — G35 Multiple sclerosis: Secondary | ICD-10-CM | POA: Diagnosis not present

## 2016-01-02 DIAGNOSIS — R369 Urethral discharge, unspecified: Secondary | ICD-10-CM

## 2016-01-02 LAB — POCT CBC
Granulocyte percent: 82.1 %G — AB (ref 37–80)
HCT, POC: 35.9 % — AB (ref 43.5–53.7)
HEMOGLOBIN: 13.2 g/dL — AB (ref 14.1–18.1)
Lymph, poc: 1.9 (ref 0.6–3.4)
MCH: 32.2 pg — AB (ref 27–31.2)
MCHC: 36.9 g/dL — AB (ref 31.8–35.4)
MCV: 87.2 fL (ref 80–97)
MID (CBC): 1 — AB (ref 0–0.9)
MPV: 7.8 fL (ref 0–99.8)
PLATELET COUNT, POC: 165 10*3/uL (ref 142–424)
POC GRANULOCYTE: 13.3 — AB (ref 2–6.9)
POC LYMPH PERCENT: 11.8 %L (ref 10–50)
POC MID %: 6.1 % (ref 0–12)
RBC: 4.12 M/uL — AB (ref 4.69–6.13)
RDW, POC: 12.5 %
WBC: 16.2 10*3/uL — AB (ref 4.6–10.2)

## 2016-01-02 MED ORDER — CEFTRIAXONE SODIUM 1 G IJ SOLR
1.0000 g | Freq: Once | INTRAMUSCULAR | Status: AC
  Administered 2016-01-02: 1 g via INTRAMUSCULAR

## 2016-01-02 MED ORDER — BENZONATATE 100 MG PO CAPS: 100.0000 mg | ORAL_CAPSULE | Freq: Three times a day (TID) | ORAL | Status: DC | PRN

## 2016-01-02 NOTE — Progress Notes (Signed)
Subjective:    Patient ID: Rodney Adkins, male    DOB: Apr 06, 1956, 61 y.o.   MRN: [id number]  01/02/2016  Follow-up   HPI This 60 y.o. male presents for evaluation of fever, cough.  Evaluated by Dr. Carlota Raspberry last night; treated with Rocephin and Levaquin.  Returning for recheck.  Did have chills last night.  Also had sweats. Chills less severe last night.  No ear pain. Did recall having a sore throat and R ear pain three days ago that has resolved.  No rhinorrhea or nasal congestion.  +cough has persisted; no sputum production; no wheezing; no SOB.  Mild nausea last night; no vomiting or diarrhea; no abdominal pain.  Self caths due to neurogenic bladder +/- BPH thus no dysuria; urine is darker than normal; no hematuria.  No odor to urine.  No rash. No tick bites.  No sick exposures.  Tolerated Rocephin last night; took Levaquin 500mg  last night.    Review of Systems  Constitutional: Positive for fever, chills, diaphoresis and fatigue. Negative for activity change and appetite change.  HENT: Negative for congestion, ear pain, rhinorrhea, sinus pressure, sore throat, trouble swallowing and voice change.   Respiratory: Positive for cough. Negative for shortness of breath.   Cardiovascular: Negative for chest pain, palpitations and leg swelling.  Gastrointestinal: Negative for nausea, vomiting, abdominal pain, diarrhea and blood in stool.  Endocrine: Negative for cold intolerance, heat intolerance, polydipsia, polyphagia and polyuria.  Genitourinary: Negative for dysuria, urgency, frequency, genital sores and penile pain.  Skin: Negative for color change, rash and wound.  Neurological: Negative for dizziness, tremors, seizures, syncope, facial asymmetry, speech difficulty, weakness, light-headedness, numbness and headaches.  Psychiatric/Behavioral: Negative for sleep disturbance and dysphoric mood. The patient is not nervous/anxious.     Past Medical History  Diagnosis Date  . Multiple  sclerosis ([place]) 1998    Sxs started 1998 with L body numbness. MRI c/w MS. Started Avonex. Saw Dr [name] at [facility] and switched to Betaseron and was on for 5 yrs but developed depression and site rxns and D/C'd 2006. While on Betaseron, occassionally required solumedrol for flares.  . Complete heart block [facility]) June 2009    a. Presented with hypotension/pulm edema 12/2007 when HOCM was also discovered. s/p dual chamber MDT pacemaker. Thallium stest 7/09 negative for infiltrative disease or ischemia.  Followed Dr [name]  Marland Kitchen HOCM (hypertrophic obstructive cardiomyopathy) ([place]) 2009    Discovered by ECHO when he developed CHB. Mgmt Dr Caryl Comes  and Dr [name] at [facility]. Has outflow obstruction on ECHO 2011  . Right bundle branch block   . Wenckebach     a. Decreased ex tolerance 2010/2012 - underwent stress echo to look @ effects of MV/LVOT - nothing noted; although pt had high rate Wenckebach behavior with associated ex intol. Pacer reprogrammed with improvement in sx.  . SOB (shortness of breath)     a. PFTs 09/2010 essentially normal per pulm note (mild obst defect). b. 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  . Prostate enlargement   . Heart murmur    Past Surgical History  Procedure Laterality Date  . Leg tendon surgery  2008    Os peroneus resection and peroneal tendon repair  . Pacemaker insertion  12/2007    Complete heart block  . Left heart catheterization with coronary angiogram N/A 08/22/2013    Procedure: LEFT HEART CATHETERIZATION WITH CORONARY ANGIOGRAM;  Surgeon: [name], MD;  Location: [facility] CATH LAB;  Service: Cardiovascular;  Laterality: N/A;  . Huey maker    . Peripheral vascular catheterization N/A 02/12/2015    Procedure: Venogram;  Surgeon: [name], MD;  Location: [place] CV LAB;  Service: Cardiovascular;  Laterality: N/A;  . Pacemaker lead removal Left 07/18/2015    Procedure: ATRIAL PACEMAKER LEAD EXTRACTION; ATRIAL PACEMAKER LEAD INSERTION; GENERATOR  REPLACEMENT.;  Surgeon: [name], MD;  Location: [place];  Service: Cardiovascular;  Laterality: Left;  [name] to back up  . Tee without cardioversion N/A 07/18/2015    Procedure: TRANSESOPHAGEAL ECHOCARDIOGRAM (TEE);  Surgeon: [name], MD;  Location: [facility] OR;  Service: Cardiovascular;  Laterality: N/A;   No Known Allergies Current Outpatient Prescriptions  Medication Sig Dispense Refill  . Ascorbic Acid (VITAMIN C) 500 MG tablet Take 500 mg by mouth daily as needed (Vitamin c supplement).     . cholecalciferol (VITAMIN D) 1000 UNITS tablet Take 1,000 Units by mouth daily. Take 5,000 units daily    . Dimethyl Fumarate (TECFIDERA) 240 MG CPDR Take 240 mg by mouth 2 (two) times daily.    . finasteride (PROSCAR) 5 MG tablet     . levofloxacin (LEVAQUIN) 500 MG tablet Take 1 tablet (500 mg total) by mouth daily. 10 tablet 0  . Multiple Vitamin (MULTIVITAMIN) tablet Take 1 tablet by mouth daily.     . tamsulosin (FLOMAX) 0.4 MG CAPS capsule      No current facility-administered medications for this visit.   Social History   Social History  . Marital Status: Married    Spouse Name: N/A  . Number of Children: 0  . Years of Education: N/A   Occupational History  . professor [employer]   Social History Main Topics  . Smoking status: Never Smoker   . Smokeless tobacco: Never Used  . Alcohol Use: No  . Drug Use: No  . Sexual Activity: Not on file   Other Topics Concern  . 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   Family History  Problem Relation Age of Onset  . Heart disease Neg Hx   . Colon polyps Father   . Hypertension Father   . Diabetes Paternal Grandmother        Objective:    BP 120/70 mmHg  Pulse 80  Temp(Src) 97.6 F (36.4 C) (Oral)  Resp 16  Ht 5\' 10"  (1.778 m)  Wt 143 lb (64.864 kg)  BMI 20.52 kg/m2  SpO2 99% Physical Exam  Constitutional: He is oriented to person, place, and time. He appears  well-developed and well-nourished.  Non-toxic appearance. He appears ill. No distress.  HENT:  Head: Normocephalic and atraumatic.  Right Ear: External ear normal.  Left Ear: External ear normal.  Nose: Nose normal.  Mouth/Throat: Oropharynx is clear and moist.  Eyes: Conjunctivae and EOM are normal. Pupils are equal, round, and reactive to light.  Neck: Normal range of motion. Neck supple. Carotid bruit is not present. No thyromegaly present.  Cardiovascular: Normal rate, regular rhythm and intact distal pulses.  Exam reveals no gallop and no friction rub.   Murmur heard.  Systolic murmur is present with a grade of 2/6  Pulmonary/Chest: Effort normal and breath sounds normal. He has no wheezes. He has no rales.  Abdominal: Soft. Bowel sounds are normal. He exhibits no distension and no mass. There is no tenderness. There is no rebound and no guarding.  Lymphadenopathy:    He has no cervical  adenopathy.  Neurological: He is alert and oriented to person, place, and time. No cranial nerve deficit.  Skin: Skin is warm and dry. No rash noted. He is not diaphoretic.  Psychiatric: He has a normal mood and affect. His behavior is normal.  Nursing note and vitals reviewed.  Results for orders placed or performed in visit on 01/02/16  POCT CBC  Result Value Ref Range   WBC 16.2 (A) 4.6 - 10.2 K/uL   Lymph, poc 1.9 0.6 - 3.4   POC LYMPH PERCENT 11.8 10 - 50 %L   MID (cbc) 1.0 (A) 0 - 0.9   POC MID % 6.1 0 - 12 %M   POC Granulocyte 13.3 (A) 2 - 6.9   Granulocyte percent 82.1 (A) 37 - 80 %G   RBC 4.12 (A) 4.69 - 6.13 M/uL   Hemoglobin 13.2 (A) 14.1 - 18.1 g/dL   HCT, POC 35.9 (A) 43.5 - 53.7 %   MCV 87.2 80 - 97 fL   MCH, POC 32.2 (A) 27 - 31.2 pg   MCHC 36.9 (A) 31.8 - 35.4 g/dL   RDW, POC 12.5 %   Platelet Count, POC 165 142 - 424 K/uL   MPV 7.8 0 - 99.8 fL       Assessment & Plan:   1. Fever, unspecified   2. Cough   3. Multiple sclerosis ([place])   4. HOCM (hypertrophic  obstructive cardiomyopathy) ([place])   5. Complete heart block ([place])   6. Pacemaker-dual-chamber Medtronic    -clinically improved from last night yet WBC essentially unchanged. -s/p repeat Rocephin injection due to persistently elevated WBC.  Continue Levaquin. -follow-up with Dr. Carlota Raspberry tomorrow in 24 hours.   Orders Placed This Encounter  Procedures  . POCT CBC   Meds ordered this encounter  Medications  . cefTRIAXone (ROCEPHIN) injection 1 g    Sig:     No Follow-up on file.   Rodney Adkins, M.D. [facility] [address] [place], [place]  [phone] phone [phone] fax

## 2016-01-02 NOTE — Patient Instructions (Addendum)
     IF you received an x-ray today, you will receive an invoice from [facility]. Please contact [facility] at [phone] with questions or concerns regarding your invoice.   IF you received labwork today, you will receive an invoice from [employer]. Please contact [facility] at [phone] with questions or concerns regarding your invoice.   Our billing staff will not be able to assist you with questions regarding bills from these companies.  You will be contacted with the lab results as soon as they are available. The fastest way to get your results is to activate your My Chart account. Instructions are located on the last page of this paperwork. If you have not heard from [place] regarding the results in 2 weeks, please contact this office.     Fever, Adult A fever is an increase in the body's temperature. It is usually defined as a temperature of 100F (38C) or higher. Brief mild or moderate fevers generally have no long-term effects, and they often do not require treatment. Moderate or high fevers may make you feel uncomfortable and can sometimes be a sign of a serious illness or disease. The sweating that may occur with repeated or prolonged fever may also cause dehydration. Fever is confirmed by taking a temperature with a thermometer. A measured temperature can vary with:  Age.  Time of day.  Location of the thermometer:  Mouth (oral).  Rectum (rectal).  Ear (tympanic).  Underarm (axillary).  Forehead (temporal). HOME CARE INSTRUCTIONS Pay attention to any changes in your symptoms. Take these actions to help with your condition:  Take over-the counter and prescription medicines only as told by your health care provider. Follow the dosing instructions carefully.  If you were prescribed an antibiotic medicine, take it as told by your health care provider. Do not stop taking the antibiotic even if you start to feel better.  Rest  as needed.  Drink enough fluid to keep your urine clear or pale yellow. This helps to prevent dehydration.  Sponge yourself or bathe with room-temperature water to help reduce your body temperature as needed. Do not use ice water.  Do not overbundle yourself in blankets or heavy clothes. SEEK MEDICAL CARE IF:  You vomit.  You cannot eat or drink without vomiting.  You have diarrhea.  You have pain when you urinate.  Your symptoms do not improve with treatment.  You develop new symptoms.  You develop excessive weakness. SEEK IMMEDIATE MEDICAL CARE IF:  You have shortness of breath or have trouble breathing.  You are dizzy or you faint.  You are disoriented or confused.  You develop signs of dehydration, such as a dry mouth, decreased urination, or paleness.  You develop severe pain in your abdomen.  You have persistent vomiting or diarrhea.  You develop a skin rash.  Your symptoms suddenly get worse.   This information is not intended to replace advice given to you by your health care provider. Make sure you discuss any questions you have with your health care provider.   Document Released: 12/16/2000 Document Revised: 03/13/2015 Document Reviewed: 08/16/2014 Elsevier Interactive Patient Education [employer].

## 2016-01-02 NOTE — Patient Instructions (Signed)
     IF you received an x-ray today, you will receive an invoice from [facility]. Please contact [facility] at [phone] with questions or concerns regarding your invoice.   IF you received labwork today, you will receive an invoice from [facility]. Please contact [facility] at [phone] with questions or concerns regarding your invoice.   Our billing staff will not be able to assist you with questions regarding bills from these companies.  You will be contacted with the lab results as soon as they are available. The fastest way to get your results is to activate your My Chart account. Instructions are located on the last page of this paperwork. If you have not heard from us regarding the results in 2 weeks, please contact this office.      

## 2016-01-02 NOTE — Progress Notes (Deleted)
Subjective:    Patient ID: Rodney Adkins, male    DOB: 1955-09-07, 60 y.o.   MRN: [id number]  01/02/2016  Infection   HPI This 60 y.o. male presents for evaluation of    Review of Systems  Constitutional: Negative for fever, chills, diaphoresis, activity change, appetite change and fatigue.  Respiratory: Negative for cough and shortness of breath.   Cardiovascular: Negative for chest pain, palpitations and leg swelling.  Gastrointestinal: Negative for nausea, vomiting, abdominal pain and diarrhea.  Endocrine: Negative for cold intolerance, heat intolerance, polydipsia, polyphagia and polyuria.  Skin: Negative for color change, rash and wound.  Neurological: Negative for dizziness, tremors, seizures, syncope, facial asymmetry, speech difficulty, weakness, light-headedness, numbness and headaches.  Psychiatric/Behavioral: Negative for sleep disturbance and dysphoric mood. The patient is not nervous/anxious.     Past Medical History  Diagnosis Date  . Multiple sclerosis ([place]) 1998    Sxs started 1998 with L body numbness. MRI c/w MS. Started Avonex. Saw Dr [name] at [facility] and switched to Betaseron and was on for 5 yrs but developed depression and site rxns and D/C'd 2006. While on Betaseron, occassionally required solumedrol for flares.  . Complete heart block [facility]) June 2009    a. Presented with hypotension/pulm edema 12/2007 when HOCM was also discovered. s/p dual chamber MDT pacemaker. Thallium stest 7/09 negative for infiltrative disease or ischemia.  Followed Dr [name]  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 Mgmt Dr Caryl Comes  and Dr [name] at [facility]. Has outflow obstruction on ECHO 2011  . Right bundle branch block   . Wenckebach     a. Decreased ex tolerance 2010/2012 - underwent stress echo to look @ effects of MV/LVOT - nothing noted; although pt had high rate Wenckebach behavior with associated ex intol. Pacer  reprogrammed with improvement in sx.  . SOB (shortness of breath)     a. PFTs 09/2010 essentially normal per pulm note (mild obst defect). b. 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  . Prostate enlargement   . Heart murmur    Past Surgical History  Procedure Laterality Date  . Leg tendon surgery  2008    Os peroneus resection and peroneal tendon repair  . Pacemaker insertion  12/2007    Complete heart block  . Left heart catheterization with coronary angiogram N/A 08/22/2013    Procedure: LEFT HEART CATHETERIZATION WITH CORONARY ANGIOGRAM;  Surgeon: [name], MD;  Location: [facility] CATH LAB;  Service: Cardiovascular;  Laterality: N/A;  . Pace maker    . Peripheral vascular catheterization N/A 02/12/2015    Procedure: Venogram;  Surgeon: [name], MD;  Location: [place] CV LAB;  Service: Cardiovascular;  Laterality: N/A;  . Pacemaker lead removal Left 07/18/2015    Procedure: ATRIAL PACEMAKER LEAD EXTRACTION; ATRIAL PACEMAKER LEAD INSERTION; GENERATOR REPLACEMENT.;  Surgeon: [name], MD;  Location: [place];  Service: Cardiovascular;  Laterality: Left;  [name] to back up  . Tee without cardioversion N/A 07/18/2015    Procedure: TRANSESOPHAGEAL ECHOCARDIOGRAM (TEE);  Surgeon: [name], MD;  [facility]: [facility] OR;  Service: Cardiovascular;  Laterality: N/A;   No Known Allergies Current Outpatient Prescriptions  Medication Sig Dispense Refill  . Ascorbic Acid (VITAMIN C) 500 MG tablet Take 500 mg by mouth daily as needed (Vitamin c supplement).     . benzonatate (TESSALON) 100 MG capsule Take 1-2 capsules (100-200 mg total) by mouth 3 (three)  times daily as needed for cough. 60 capsule 0  . cholecalciferol (VITAMIN D) 1000 UNITS tablet Take 1,000 Units by mouth daily. Take 5,000 units daily    . Dimethyl Fumarate (TECFIDERA) 240 MG CPDR Take 240 mg by mouth 2 (two) times daily.    . finasteride (PROSCAR) 5 MG tablet     . levofloxacin (LEVAQUIN) 500 MG tablet Take 1  tablet (500 mg total) by mouth daily. 10 tablet 0  . Multiple Vitamin (MULTIVITAMIN) tablet Take 1 tablet by mouth daily.     . tamsulosin (FLOMAX) 0.4 MG CAPS capsule      No current facility-administered medications for this visit.   Social History   Social History  . Marital Status: Married    Spouse Name: N/A  . Number of Children: 0  . Years of Education: N/A   Occupational History  . professor [employer]   Social History Main Topics  . Smoking status: Never Smoker   . Smokeless tobacco: Never Used  . Alcohol Use: No  . Drug Use: No  . Sexual Activity: Not on file   Other Topics Concern  . 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   Family History  Problem Relation Age of Onset  . Heart disease Neg Hx   . Colon polyps Father   . Hypertension Father   . Diabetes Paternal Grandmother        Objective:    BP 136/68 mmHg  Pulse 90  Temp(Src) 102 F (38.9 C) (Oral)  Resp 18  SpO2 100% Physical Exam  Constitutional: He is oriented to person, place, and time. He appears well-developed and well-nourished. No distress.  HENT:  Head: Normocephalic and atraumatic.  Right Ear: External ear normal.  Left Ear: External ear normal.  Nose: Nose normal.  Mouth/Throat: Oropharynx is clear and moist.  Eyes: Conjunctivae and EOM are normal. Pupils are equal, round, and reactive to light.  Neck: Normal range of motion. Neck supple. Carotid bruit is not present. No thyromegaly present.  Cardiovascular: Normal rate, regular rhythm, normal heart sounds and intact distal pulses.  Exam reveals no gallop and no friction rub.   No murmur heard. Pulmonary/Chest: Effort normal and breath sounds normal. He has no wheezes. He has no rales.  Abdominal: Soft. Bowel sounds are normal. He exhibits no distension and no mass. There is no tenderness. There is no rebound and no guarding.  Lymphadenopathy:    He has no cervical adenopathy.   Neurological: He is alert and oriented to person, place, and time. No cranial nerve deficit.  Skin: Skin is warm and dry. No rash noted. He is not diaphoretic.  Psychiatric: He has a normal mood and affect. His behavior is normal.  Nursing note and vitals reviewed.  Results for orders placed or performed in visit on 01/02/16  POCT CBC  Result Value Ref Range   WBC 16.2 (A) 4.6 - 10.2 K/uL   Lymph, poc 1.9 0.6 - 3.4   POC LYMPH PERCENT 11.8 10 - 50 %L   MID (cbc) 1.0 (A) 0 - 0.9   POC MID % 6.1 0 - 12 %M   POC Granulocyte 13.3 (A) 2 - 6.9   Granulocyte percent 82.1 (A) 37 - 80 %G   RBC 4.12 (A) 4.69 - 6.13 M/uL   Hemoglobin 13.2 (A) 14.1 - 18.1 g/dL   HCT, POC 35.9 (A) 43.5 - 53.7 %   MCV 87.2  80 - 97 fL   MCH, POC 32.2 (A) 27 - 31.2 pg   MCHC 36.9 (A) 31.8 - 35.4 g/dL   RDW, POC 12.5 %   Platelet Count, POC 165 142 - 424 K/uL   MPV 7.8 0 - 99.8 fL       Assessment & Plan:  No diagnosis found.  No orders of the defined types were placed in this encounter.   Meds ordered this encounter  Medications  . benzonatate (TESSALON) 100 MG capsule    Sig: Take 1-2 capsules (100-200 mg total) by mouth 3 (three) times daily as needed for cough.    Dispense:  60 capsule    Refill:  0    No Follow-up on file.    Karey Suthers Elayne Guerin, M.D. [facility] [address] [place], [place]  [address] [phone] phone [phone] fax  This encounter was created in error - please disregard.

## 2016-01-03 ENCOUNTER — Ambulatory Visit (INDEPENDENT_AMBULATORY_CARE_PROVIDER_SITE_OTHER): Payer: BC Managed Care – PPO | Admitting: Family Medicine

## 2016-01-03 VITALS — BP 122/64 | HR 85 | Temp 98.0°F | Resp 18 | Ht 70.0 in | Wt 141.0 lb

## 2016-01-03 DIAGNOSIS — R509 Fever, unspecified: Secondary | ICD-10-CM | POA: Diagnosis not present

## 2016-01-03 DIAGNOSIS — R05 Cough: Secondary | ICD-10-CM

## 2016-01-03 DIAGNOSIS — N400 Enlarged prostate without lower urinary tract symptoms: Secondary | ICD-10-CM | POA: Diagnosis not present

## 2016-01-03 DIAGNOSIS — N410 Acute prostatitis: Secondary | ICD-10-CM | POA: Diagnosis not present

## 2016-01-03 DIAGNOSIS — R059 Cough, unspecified: Secondary | ICD-10-CM

## 2016-01-03 LAB — POCT CBC
Granulocyte percent: 78.6 %G (ref 37–80)
HCT, POC: 36.4 % — AB (ref 43.5–53.7)
HEMOGLOBIN: 13.1 g/dL — AB (ref 14.1–18.1)
LYMPH, POC: 1.6 (ref 0.6–3.4)
MCH, POC: 31.8 pg — AB (ref 27–31.2)
MCHC: 36.1 g/dL — AB (ref 31.8–35.4)
MCV: 88 fL (ref 80–97)
MID (CBC): 0.7 (ref 0–0.9)
MPV: 8.2 fL (ref 0–99.8)
PLATELET COUNT, POC: 190 10*3/uL (ref 142–424)
POC Granulocyte: 8.3 — AB (ref 2–6.9)
POC LYMPH PERCENT: 15 %L (ref 10–50)
POC MID %: 6.4 % (ref 0–12)
RBC: 4.14 M/uL — AB (ref 4.69–6.13)
RDW, POC: 11.8 %
WBC: 10.5 10*3/uL — AB (ref 4.6–10.2)

## 2016-01-03 LAB — PSA: PSA: 39.47 ng/mL — AB (ref <=4.00)

## 2016-01-03 MED ORDER — CEFTRIAXONE SODIUM 1 G IJ SOLR
1.0000 g | Freq: Once | INTRAMUSCULAR | Status: AC
  Administered 2016-01-03: 1 g via INTRAMUSCULAR

## 2016-01-03 NOTE — Progress Notes (Signed)
Subjective:    Patient ID: Rodney Adkins, male    DOB: 1955-11-12, 59 y.o.   MRN: [id number]  01/02/2016  Follow-up   HPI This 60 y.o. male presents for a second visit today around 5:00pm due to penile discharge this afternoon while attempting self cath. Patient reports that discharge was a very small amount and yellow-green in nature. Pt is very concerned with UTI as cause of penile discharge.  Must self cath due to neurogenic bladder.  Currently has fever, cough, discoloration of urine. Urine culture is pending. S/p Rocephin injection this morning.  Has taken one dose of Levaquin last night.  Also awaiting PSA to return from yesterday.  No abdominal pain; no n/v/d/c.  With self cath, large amount of debris in urine.  Has known bladder stones; has saved a stone from urine sample. Attempted to see on-call urologist today but no appointments available at 3:30 as surgeon was leaving for the hospital.  Married; no new sexual partners.  No recent intercourse since having to perform self catheterization for the past month.   Review of Systems  Constitutional: Negative for fever, chills, diaphoresis and fatigue.  HENT: Negative for congestion, ear pain, rhinorrhea and sore throat.   Respiratory: Positive for cough. Negative for shortness of breath and wheezing.   Gastrointestinal: Negative for nausea, vomiting, abdominal pain, diarrhea, constipation, blood in stool, abdominal distention and rectal pain.  Genitourinary: Positive for discharge. Negative for dysuria, urgency, frequency, hematuria, flank pain, decreased urine volume, penile swelling, scrotal swelling, genital sores, penile pain and testicular pain.    Past Medical History  Diagnosis Date  . Multiple sclerosis ([place]) 1998    Sxs started 1998 with L body numbness. MRI c/w MS. Started Avonex. Saw Dr [name] at [facility] and switched to Betaseron and was on for 5 yrs but developed depression and site rxns and D/C'd 2006. While on Betaseron,  occassionally required solumedrol for flares.  . Complete heart block [facility]) June 2009    a. Presented with hypotension/pulm edema 12/2007 when HOCM was also discovered. s/p dual chamber MDT pacemaker. Thallium stest 7/09 negative for infiltrative disease or ischemia.  Followed Dr [name]  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 Mgmt Dr Caryl Comes  and Dr [name] at [facility]. Has outflow obstruction on ECHO 2011  . Right bundle branch block   . Wenckebach     a. Decreased ex tolerance 2010/2012 - underwent stress echo to look @ effects of MV/LVOT - nothing noted; although pt had high rate Wenckebach behavior with associated ex intol. Pacer reprogrammed with improvement in sx.  . SOB (shortness of breath)     a. PFTs 09/2010 essentially normal per pulm note (mild obst defect). b. 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  . Prostate enlargement   . Heart murmur    Past Surgical History  Procedure Laterality Date  . Leg tendon surgery  2008    Os peroneus resection and peroneal tendon repair  . Pacemaker insertion  12/2007    Complete heart block  . Left heart catheterization with coronary angiogram N/A 08/22/2013    Procedure: LEFT HEART CATHETERIZATION WITH CORONARY ANGIOGRAM;  Surgeon: [name], MD;  Location: [facility] CATH LAB;  Service: Cardiovascular;  Laterality: N/A;  . Pace maker    . Peripheral vascular catheterization N/A 02/12/2015    Procedure: Venogram;  Surgeon: [name], MD;  Location: [place] CV LAB;  Service:  Cardiovascular;  Laterality: N/A;  . Pacemaker lead removal Left 07/18/2015    Procedure: ATRIAL PACEMAKER LEAD EXTRACTION; ATRIAL PACEMAKER LEAD INSERTION; GENERATOR REPLACEMENT.;  Surgeon: [name], MD;  Location: [place];  Service: Cardiovascular;  Laterality: Left;  [name] to back up  . Tee without cardioversion N/A 07/18/2015    Procedure: TRANSESOPHAGEAL ECHOCARDIOGRAM (TEE);  Surgeon: [name], MD;  Location: [facility] OR;  Service: Cardiovascular;  Laterality: N/A;   No Known Allergies  Social History   Social History  . Marital Status: Married    Spouse Name: N/A  . Number of Children: 0  . Years of Education: N/A   Occupational History  . professor [employer]   Social History Main Topics  . Smoking status: Never Smoker   . Smokeless tobacco: Never Used  . Alcohol Use: No  . Drug Use: No  . Sexual Activity: Not on file   Other Topics Concern  . 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   Family History  Problem Relation Age of Onset  . Heart disease Neg Hx   . Colon polyps Father   . Hypertension Father   . Diabetes Paternal Grandmother        Objective:    BP 120/70 mmHg  Pulse 80  Temp(Src) 97.6 F (36.4 C) ([place])  Resp 16  Ht 5\' 10"  (1.778 m)  Wt 143 lb (64.864 kg)  BMI 20.52 kg/m2  SpO2 99% Physical Exam  Constitutional: He is oriented to person, place, and time. He appears well-developed and well-nourished. No distress.  HENT:  Head: Normocephalic and atraumatic.  Eyes: Conjunctivae and EOM are normal. Pupils are equal, round, and reactive to light.  Neck: Normal range of motion. Neck supple. Carotid bruit is not present. No thyromegaly present.  Cardiovascular: Normal rate, regular rhythm, normal heart sounds and intact distal pulses.  Exam reveals no gallop and no friction rub.   No murmur heard. Pulmonary/Chest: Effort normal and breath sounds normal. He has no wheezes. He has no rales.  Frequent coughing during the exam.  Abdominal: Soft. Bowel sounds are normal. He exhibits no distension and no mass. There is no tenderness. There is no rebound and no guarding. Hernia confirmed negative in the right inguinal area and confirmed negative in the left inguinal area.  Genitourinary: Testes normal and penis normal. Right testis shows no mass, no swelling and no tenderness. Left testis shows  no mass, no swelling and no tenderness. Circumcised. No penile erythema or penile tenderness. No discharge found.  Lymphadenopathy:    He has no cervical adenopathy.       Right: No inguinal adenopathy present.       Left: No inguinal adenopathy present.  Neurological: He is alert and oriented to person, place, and time. No cranial nerve deficit.  Skin: Skin is warm and dry. No rash noted. He is not diaphoretic.  Psychiatric: He has a normal mood and affect. His behavior is normal.  Nursing note and vitals reviewed.  Results for orders placed or performed in visit on 01/02/16  POCT CBC  Result Value Ref Range   WBC 16.2 (A) 4.6 - 10.2 K/uL   Lymph, poc 1.9 0.6 - 3.4   POC LYMPH PERCENT 11.8 10 - 50 %L   MID (cbc) 1.0 (A) 0 - 0.9   POC MID % 6.1 0 - 12 %M   POC Granulocyte 13.3 (A) 2 - 6.9  Granulocyte percent 82.1 (A) 37 - 80 %G   RBC 4.12 (A) 4.69 - 6.13 M/uL   Hemoglobin 13.2 (A) 14.1 - 18.1 g/dL   HCT, POC 35.9 (A) 43.5 - 53.7 %   MCV 87.2 80 - 97 fL   MCH, POC 32.2 (A) 27 - 31.2 pg   MCHC 36.9 (A) 31.8 - 35.4 g/dL   RDW, POC 12.5 %   Platelet Count, POC 165 142 - 424 K/uL   MPV 7.8 0 - 99.8 fL       Assessment & Plan:   1. Fever, unspecified   2. Cough   3. Multiple sclerosis ([place])   4. HOCM (hypertrophic obstructive cardiomyopathy) ([place])   5. Complete heart block ([place])   6. Pacemaker-dual-chamber Medtronic   7. Penile discharge    -no change in therapy at this time. -continue Levaquin 500mg  daily. -attempt to see urology tomorrow -also follow-up with Dr. Carlota Raspberry tomorrow for recheck -urine culture and PSA still pending. -rx for Tessalon Per[place] provided for worsening cough.   Orders Placed This Encounter  Procedures  . POCT CBC   Meds ordered this encounter  Medications  . cefTRIAXone (ROCEPHIN) injection 1 g    Sig:     Return if symptoms worsen or fail to improve.    Kyree Fedorko Elayne Guerin, M.D. [facility] [address] [place], [place]  [address] [phone] phone [phone] fax

## 2016-01-03 NOTE — Patient Instructions (Addendum)
IF you received an x-ray today, you will receive an invoice from [facility]. Please contact [facility] at [phone] with questions or concerns regarding your invoice.   IF you received labwork today, you will receive an invoice from [employer]. Please contact [facility] at [phone] with questions or concerns regarding your invoice.   Our billing staff will not be able to assist you with questions regarding bills from these companies.  You will be contacted with the lab results as soon as they are available. The fastest way to get your results is to activate your My Chart account. Instructions are located on the last page of this paperwork. If you have not heard from [place] regarding the results in 2 weeks, please contact this office.    lungs sound clear today, your blood count is improving. I suspect most of fever and chills were due to a prostate infection. I discussed this with the urologist on call, and he did not recommend any change in treatment at this point. We will do one more injection of Rocephin today, and continue the Levaquin. Follow-up on Monday if you are not continuing to improve, tomorrow if worse, otherwise keep follow-up as planned with urologist next week. If you do encounter any difficulty passing the catheter, then may need to have a catheter that is left in until the prostate infection improves. However after discussion with urologist, if you are passing the catheter without any difficulty, we can hold off on indwelling cath at this point.  Okay to continue Tessalon as needed for cough, make sure you're drinking plenty of fluids. Return for recheck if cough worsens.  Return to the clinic or go to the nearest emergency room if any of your symptoms worsen or new symptoms occur.  Prostatitis The prostate gland is about the size and shape of a walnut. It is located just below your bladder. It produces one of the components of  semen, which is made up of sperm and the fluids that help nourish and transport it out from the testicles. Prostatitis is inflammation of the prostate gland.  There are four types of prostatitis:  Acute bacterial prostatitis. This is the least common type of prostatitis. It starts quickly and usually is associated with a bladder infection, high fever, and shaking chills. It can occur at any age.  Chronic bacterial prostatitis. This is a persistent bacterial infection in the prostate. It usually develops from repeated acute bacterial prostatitis or acute bacterial prostatitis that was not properly treated. It can occur in men of any age but is most common in middle-aged men whose prostate has begun to enlarge. The symptoms are not as severe as those in acute bacterial prostatitis. Discomfort in the part of your body that is in front of your rectum and below your scrotum (perineum), lower abdomen, or in the head of your penis (glans) may represent your primary discomfort.  Chronic prostatitis (nonbacterial). This is the most common type of prostatitis. It is inflammation of the prostate gland that is not caused by a bacterial infection. The cause is unknown and may be associated with a viral infection or autoimmune disorder.  Prostatodynia (pelvic floor disorder). This is associated with increased muscular tone in the pelvis surrounding the prostate. CAUSES The causes of bacterial prostatitis are bacterial infection. The causes of the other types of prostatitis are unknown.  SYMPTOMS  Symptoms can vary depending upon the type of prostatitis that exists. There can also be overlap in  symptoms. Possible symptoms for each type of prostatitis are listed below. Acute Bacterial Prostatitis  Painful urination.  Fever or chills.  Muscle or joint pains.  Low back pain.  Low abdominal pain.  Inability to empty bladder completely. Chronic Bacterial Prostatitis, Chronic Nonbacterial Prostatitis, and  Prostatodynia  Sudden urge to urinate.  Frequent urination.  Difficulty starting urine stream.  Weak urine stream.  Discharge from the urethra.  Dribbling after urination.  Rectal pain.  Pain in the testicles, penis, or tip of the penis.  Pain in the perineum.  Problems with sexual function.  Painful ejaculation.  Bloody semen. DIAGNOSIS  In order to diagnose prostatitis, your health care provider will ask about your symptoms. One or more urine samples will be taken and tested (urinalysis). If the urinalysis result is negative for bacteria, your health care provider may use a finger to feel your prostate (digital rectal exam). This exam helps your health care provider determine if your prostate is swollen and tender. It will also produce a specimen of semen that can be analyzed. TREATMENT  Treatment for prostatitis depends on the cause. If a bacterial infection is the cause, it can be treated with antibiotic medicine. In cases of chronic bacterial prostatitis, the use of antibiotics for up to 1 month or 6 weeks may be necessary. Your health care provider may instruct you to take sitz baths to help relieve pain. A sitz bath is a bath of hot water in which your hips and buttocks are under water. This relaxes the pelvic floor muscles and often helps to relieve the pressure on your prostate. HOME CARE INSTRUCTIONS   Take all medicines as directed by your health care provider.  Take sitz baths as directed by your health care provider. SEEK MEDICAL CARE IF:   Your symptoms get worse, not better.  You have a fever. SEEK IMMEDIATE MEDICAL CARE IF:   You have chills.  You feel nauseous or vomit.  You feel lightheaded or faint.  You are unable to urinate.  You have blood or blood clots in your urine. MAKE SURE YOU:  Understand these instructions.  Will watch your condition.  Will get help right away if you are not doing well or get worse.   This information is not  intended to replace advice given to you by your health care provider. Make sure you discuss any questions you have with your health care provider.   Document Released: 06/19/2000 Document Revised: 07/13/2014 Document Reviewed: 01/09/2013 Elsevier Interactive Patient Education [employer].

## 2016-01-03 NOTE — Progress Notes (Signed)
Entered in error

## 2016-01-03 NOTE — Progress Notes (Signed)
Subjective:  By signing my name below, I, Raven Small, attest that this documentation has been prepared under the direction and in the presence of [name], MD.  Electronically Signed: Thea Alken, ED Scribe. 01/03/2016. 1:09 PM.   Patient ID: Rodney Adkins, male    DOB: [birth year], 60 y.o.   MRN: [id number]  HPI Chief Complaint  Patient presents with  . Follow-up    Infection    HPI Comments: Rodney Adkins is a 60 y.o. male who presents to the [facility] for a follow up. Hx of multi med problem including heart block, MS and neurogenic bladder. He was seen 2 days ago with acute onset fever, chills and cough. Suspected early pneumonia, treated with 1g of rocephin anf levaquin 500 mg. Did have some slight worsening of his balance difficulty, but non focal neuro exam. He did have hematuria but no other urinary symptoms and has been seen by urology previously. He was seen in follow up with Dr. Tamala Julian yesterday. He had persistent leukocytosis  yesterday morning but clincally improved. Rocephin was repeated. He did have sig elevated PSA of 39.4. Urine culture still in process.   cough same or a little worse, but overall feeling better. temps improving. last night up to 99.2. not shortness of breath, no rigors.  able to eat and drink ok - just not alot. has taken 4 of tessalon perles for cough, tylenol last night and ASA today. no new side effects with Levaquin. no diarrhea. tolerating fluids.   noted small amount of pus from the penis yesterday afternoon and last night. noted prior to self cath. no penile discharge later this morning. no blood in urine. no back or abd pain. no new sexual contacts. able to self cath without obstruction or difficulty. uses 18ga french with coudet tip catheter. Urologist : Gaynelle Arabian at [facility], appt next Friday.   Patient Active Problem List   Diagnosis Date Noted  . Fractured atrial pacemaker lead wire 07/18/2015  . 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 Chest pain 08/02/2013  . SOB (shortness of breath) 08/02/2013  . PVC (premature ventricular contraction) 02/13/2013  . Chest pain on exertion 02/25/2012  . Pacemaker-dual-chamber Medtronic 04/29/2011  . Change in bowel habits 01/22/2011  . Weight loss 01/22/2011  . COUGH 09/01/2010  . Multiple sclerosis ([place])   . HOCM (hypertrophic obstructive cardiomyopathy) ([place])   . Right bundle branch block   . Complete heart block ([place]) 12/05/2007   Past Medical History  Diagnosis Date  . Multiple sclerosis ([place]) 1998    Sxs started 1998 with L body numbness. MRI c/w MS. Started Avonex. Saw Dr [name] at [facility] and switched to Betaseron and was on for 5 yrs but developed depression and site rxns and D/C'd 2006. While on Betaseron, occassionally required solumedrol for flares.  . Complete heart block [facility]) June 2009    a. Presented with hypotension/pulm edema 12/2007 when HOCM was also discovered. s/p dual chamber MDT pacemaker. Thallium stest 7/09 negative for infiltrative disease or ischemia.  Followed Dr [name]  Marland Kitchen HOCM (hypertrophic obstructive cardiomyopathy) ([place]) 2009    Discovered by ECHO when he developed CHB. Mgmt Dr Caryl Comes  and Dr [name] at [facility]. Has outflow obstruction on ECHO 2011  . Right bundle branch block   . Wenckebach     a. Decreased ex tolerance 2010/2012 - underwent stress echo to look @ effects of MV/LVOT - nothing noted; although pt had high rate Wenckebach behavior with associated  ex intol. Pacer reprogrammed with improvement in sx.  . SOB (shortness of breath)     a. PFTs 09/2010 essentially normal per pulm note (mild obst defect). b. 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  . Prostate enlargement   . Heart murmur    Past Surgical History  Procedure Laterality Date  . Leg tendon surgery  2008    Os peroneus resection and peroneal tendon repair  . Pacemaker insertion  12/2007    Complete heart block  . Left heart catheterization with coronary  angiogram N/A 08/22/2013    Procedure: LEFT HEART CATHETERIZATION WITH CORONARY ANGIOGRAM;  Surgeon: [name], MD;  Location: [facility] CATH LAB;  Service: Cardiovascular;  Laterality: N/A;  . Pace maker    . Peripheral vascular catheterization N/A 02/12/2015    Procedure: Venogram;  Surgeon: [name], MD;  Location: [facility] CV LAB;  Service: Cardiovascular;  Laterality: N/A;  . Pacemaker lead removal Left 07/18/2015    Procedure: ATRIAL PACEMAKER LEAD EXTRACTION; ATRIAL PACEMAKER LEAD INSERTION; GENERATOR REPLACEMENT.;  Surgeon: [name], MD;  Location: [facility];  Service: Cardiovascular;  Laterality: Left;  [name] to back up  . Tee without cardioversion N/A 07/18/2015    Procedure: TRANSESOPHAGEAL ECHOCARDIOGRAM (TEE);  Surgeon: [name], MD;  Location: [facility] OR;  Service: Cardiovascular;  Laterality: N/A;   No Known Allergies Prior to Admission medications   Medication Sig Start Date End Date Taking? Authorizing Provider  Ascorbic Acid (VITAMIN C) 500 MG tablet Take 500 mg by mouth daily as needed (Vitamin c supplement).     Historical Provider, MD  benzonatate (TESSALON) 100 MG capsule Take 1-2 capsules (100-200 mg total) by mouth 3 (three) times daily as needed for cough. 01/02/16   Wardell Honour, MD  cholecalciferol (VITAMIN D) 1000 UNITS tablet Take 1,000 Units by mouth daily. Take 5,000 units daily    Historical Provider, MD  Dimethyl Fumarate (TECFIDERA) 240 MG CPDR Take 240 mg by mouth 2 (two) times daily.    Historical Provider, MD  finasteride (PROSCAR) 5 MG tablet  12/20/15   Historical Provider, MD  levofloxacin (LEVAQUIN) 500 MG tablet Take 1 tablet (500 mg total) by mouth daily. 01/01/16   [name], MD  Multiple Vitamin (MULTIVITAMIN) tablet Take 1 tablet by mouth daily.     Historical Provider, MD  tamsulosin (FLOMAX) 0.4 MG CAPS capsule  12/20/15   Historical Provider, MD   Social History   Social History  . Marital Status: Married    Spouse Name: N/A    . Number of Children: 0  . Years of Education: N/A   Occupational History  . professor [employer]   Social History Main Topics  . Smoking status: Never Smoker   . Smokeless tobacco: Never Used  . Alcohol Use: No  . Drug Use: No  . Sexual Activity: Not on file   Other Topics Concern  . 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      Review of Systems  Constitutional: Positive for fever. Negative for chills.  Respiratory: Positive for cough. Negative for shortness of breath.   Gastrointestinal: Negative for abdominal pain.  Genitourinary: Positive for discharge. Negative for flank pain.       Objective:   Physical Exam  Constitutional: He is oriented to person, place, and time. He appears well-developed and well-nourished.  HENT:  Head: Normocephalic and atraumatic.  Right Ear:  Tympanic membrane, external ear and ear canal normal.  Left Ear: Tympanic membrane, external ear and ear canal normal.  Nose: No rhinorrhea.  Mouth/Throat: Oropharynx is clear and moist and mucous membranes are normal. No oropharyngeal exudate or posterior oropharyngeal erythema.  Eyes: Conjunctivae and EOM are normal. Pupils are equal, round, and reactive to light.  Neck: Neck supple. No JVD present. Carotid bruit is not present.  Cardiovascular: Normal rate, regular rhythm, normal heart sounds and intact distal pulses.   No murmur heard. Pulmonary/Chest: Effort normal and breath sounds normal. He has no wheezes. He has no rhonchi. He has no rales.  Abdominal: Soft. There is no tenderness. There is no rebound and no CVA tenderness.  Musculoskeletal: He exhibits no edema.  Lymphadenopathy:    He has no cervical adenopathy.  Neurological: He is alert and oriented to person, place, and time.  Skin: Skin is warm and dry. No rash noted.  Psychiatric: He has a normal mood and affect. His behavior is normal.  Vitals reviewed.  Filed Vitals:    01/03/16 1129  BP: 122/64  Pulse: 85  Temp: 98 F (36.7 C)  TempSrc: Oral  Resp: 18  Height: 5\' 10"  (1.778 m)  Weight: 141 lb (63.957 kg)  SpO2: 100%    Results for orders placed or performed in visit on 01/03/16  POCT CBC  Result Value Ref Range   WBC 10.5 (A) 4.6 - 10.2 K/uL   Lymph, poc 1.6 0.6 - 3.4   POC LYMPH PERCENT 15.0 10 - 50 %L   MID (cbc) 0.7 0 - 0.9   POC MID % 6.4 0 - 12 %M   POC Granulocyte 8.3 (A) 2 - 6.9   Granulocyte percent 78.6 37 - 80 %G   RBC 4.14 (A) 4.69 - 6.13 M/uL   Hemoglobin 13.1 (A) 14.1 - 18.1 g/dL   HCT, POC 36.4 (A) 43.5 - 53.7 %   MCV 88.0 80 - 97 fL   MCH, POC 31.8 (A) 27 - 31.2 pg   MCHC 36.1 (A) 31.8 - 35.4 g/dL   RDW, POC 11.8 %   Platelet Count, POC 190 142 - 424 K/uL   MPV 8.2 0 - 99.8 fL       Assessment & Plan:   Rodney Adkins is a 60 y.o. male Fever, unspecified - Plan: POCT CBC, cefTRIAXone (ROCEPHIN) injection 1 g  Cough - Plan: POCT CBC  BPH (benign prostatic hyperplasia) - Plan: POCT CBC  Prostatitis, acute - Plan: POCT CBC, cefTRIAXone (ROCEPHIN) injection 1 g  History BPH, now with acute prostatitis likely. Clinically improving, leukocytosis improving, afebrile in office. Tolerating Levaquin without new side effects, and Rocephin injections past 2 days. Discussed with urology.  -Repeat Rocephin 1 g today, then should be sufficient to just continue Levaquin.  -RTC/ER precautions if difficulty passing catheter as may need indwelling catheter, but improving so less likely.  -Persistent cough, may be upper respiratory infection or viral syndrome along with his current prostatitis. Continue Tessalon Perles, symptomatic care and RTC precautions.   Meds ordered this encounter  Medications  . cefTRIAXone (ROCEPHIN) injection 1 g    Sig:     Order Specific Question:  Antibiotic Indication:    Answer:  Other Indication (list below)   Patient Instructions       IF you received an x-ray today, you will receive an  invoice from [facility]. Please contact [facility] at [phone] with questions or concerns regarding your invoice.   IF  you received labwork today, you will receive an invoice from [employer]. Please contact [facility] at [phone] with questions or concerns regarding your invoice.   Our billing staff will not be able to assist you with questions regarding bills from these companies.  You will be contacted with the lab results as soon as they are available. The fastest way to get your results is to activate your My Chart account. Instructions are located on the last page of this paperwork. If you have not heard from [place] regarding the results in 2 weeks, please contact this office.    lungs sound clear today, your blood count is improving. I suspect most of fever and chills were due to a prostate infection. I discussed this with the urologist on call, and he did not recommend any change in treatment at this point. We will do one more injection of Rocephin today, and continue the Levaquin. Follow-up on Monday if you are not continuing to improve, tomorrow if worse, otherwise keep follow-up as planned with urologist next week. If you do encounter any difficulty passing the catheter, then may need to have a catheter that is left in until the prostate infection improves. However after discussion with urologist, if you are passing the catheter without any difficulty, we can hold off on indwelling cath at this point.  Okay to continue Tessalon as needed for cough, make sure you're drinking plenty of fluids. Return for recheck if cough worsens.  Return to the clinic or go to the nearest emergency room if any of your symptoms worsen or new symptoms occur.  Prostatitis The prostate gland is about the size and shape of a walnut. It is located just below your bladder. It produces one of the components of semen, which is made up of sperm and the fluids that help  nourish and transport it out from the testicles. Prostatitis is inflammation of the prostate gland.  There are four types of prostatitis:  Acute bacterial prostatitis. This is the least common type of prostatitis. It starts quickly and usually is associated with a bladder infection, high fever, and shaking chills. It can occur at any age.  Chronic bacterial prostatitis. This is a persistent bacterial infection in the prostate. It usually develops from repeated acute bacterial prostatitis or acute bacterial prostatitis that was not properly treated. It can occur in men of any age but is most common in middle-aged men whose prostate has begun to enlarge. The symptoms are not as severe as those in acute bacterial prostatitis. Discomfort in the part of your body that is in front of your rectum and below your scrotum (perineum), lower abdomen, or in the head of your penis (glans) may represent your primary discomfort.  Chronic prostatitis (nonbacterial). This is the most common type of prostatitis. It is inflammation of the prostate gland that is not caused by a bacterial infection. The cause is unknown and may be associated with a viral infection or autoimmune disorder.  Prostatodynia (pelvic floor disorder). This is associated with increased muscular tone in the pelvis surrounding the prostate. CAUSES The causes of bacterial prostatitis are bacterial infection. The causes of the other types of prostatitis are unknown.  SYMPTOMS  Symptoms can vary depending upon the type of prostatitis that exists. There can also be overlap in symptoms. Possible symptoms for each type of prostatitis are listed below. Acute Bacterial Prostatitis  Painful urination.  Fever or chills.  Muscle or joint pains.  Low back pain.  Low abdominal  pain.  Inability to empty bladder completely. Chronic Bacterial Prostatitis, Chronic Nonbacterial Prostatitis, and Prostatodynia  Sudden urge to urinate.  Frequent  urination.  Difficulty starting urine stream.  Weak urine stream.  Discharge from the urethra.  Dribbling after urination.  Rectal pain.  Pain in the testicles, penis, or tip of the penis.  Pain in the perineum.  Problems with sexual function.  Painful ejaculation.  Bloody semen. DIAGNOSIS  In order to diagnose prostatitis, your health care provider will ask about your symptoms. One or more urine samples will be taken and tested (urinalysis). If the urinalysis result is negative for bacteria, your health care provider may use a finger to feel your prostate (digital rectal exam). This exam helps your health care provider determine if your prostate is swollen and tender. It will also produce a specimen of semen that can be analyzed. TREATMENT  Treatment for prostatitis depends on the cause. If a bacterial infection is the cause, it can be treated with antibiotic medicine. In cases of chronic bacterial prostatitis, the use of antibiotics for up to 1 month or 6 weeks may be necessary. Your health care provider may instruct you to take sitz baths to help relieve pain. A sitz bath is a bath of hot water in which your hips and buttocks are under water. This relaxes the pelvic floor muscles and often helps to relieve the pressure on your prostate. HOME CARE INSTRUCTIONS   Take all medicines as directed by your health care provider.  Take sitz baths as directed by your health care provider. SEEK MEDICAL CARE IF:   Your symptoms get worse, not better.  You have a fever. SEEK IMMEDIATE MEDICAL CARE IF:   You have chills.  You feel nauseous or vomit.  You feel lightheaded or faint.  You are unable to urinate.  You have blood or blood clots in your urine. MAKE SURE YOU:  Understand these instructions.  Will watch your condition.  Will get help right away if you are not doing well or get worse.   This information is not intended to replace advice given to you by your health  care provider. Make sure you discuss any questions you have with your health care provider.   Document Released: 06/19/2000 Document Revised: 07/13/2014 Document Reviewed: 01/09/2013 Elsevier Interactive Patient Education [employer].      I personally performed the services described in this documentation, which was scribed in my presence. The recorded information has been reviewed and considered, and addended by me as needed.   Signed,   [name], MD [facility].  01/03/2016 2:56 PM

## 2016-01-03 NOTE — Addendum Note (Signed)
Addended by: Wardell Honour on: 01/03/2016 10:37 PM   Modules accepted: Level of Service

## 2016-01-05 LAB — URINE CULTURE: Colony Count: 50000

## 2016-01-06 ENCOUNTER — Ambulatory Visit (INDEPENDENT_AMBULATORY_CARE_PROVIDER_SITE_OTHER): Payer: BC Managed Care – PPO | Admitting: Family Medicine

## 2016-01-06 VITALS — BP 110/68 | HR 71 | Temp 97.8°F | Resp 16 | Ht 70.0 in | Wt 142.0 lb

## 2016-01-06 DIAGNOSIS — R05 Cough: Secondary | ICD-10-CM | POA: Diagnosis not present

## 2016-01-06 DIAGNOSIS — G35 Multiple sclerosis: Secondary | ICD-10-CM | POA: Diagnosis not present

## 2016-01-06 DIAGNOSIS — N410 Acute prostatitis: Secondary | ICD-10-CM

## 2016-01-06 DIAGNOSIS — N400 Enlarged prostate without lower urinary tract symptoms: Secondary | ICD-10-CM | POA: Diagnosis not present

## 2016-01-06 DIAGNOSIS — M7582 Other shoulder lesions, left shoulder: Secondary | ICD-10-CM

## 2016-01-06 DIAGNOSIS — R059 Cough, unspecified: Secondary | ICD-10-CM

## 2016-01-06 MED ORDER — AMOXICILLIN-POT CLAVULANATE 875-125 MG PO TABS: 1.0000 | ORAL_TABLET | Freq: Two times a day (BID) | ORAL | Status: DC

## 2016-01-06 NOTE — Progress Notes (Signed)
Subjective:    Patient ID: Rodney Adkins, male    DOB: 1955/07/11, 60 y.o.   MRN: [id number]  01/06/2016  Follow-up (fever and cough , concern that he is having tendon problems from abx )   HPI This 60 y.o. male presents for evaluation of L shoulder pain.   Onset of L shoulder pain two days ago.  Yesterday, could raise arm; now today cannot lift over 45 degrees.  Worried about tendonitis from [place].  history of rotator cuff injury.  No known injury.  No associated neck pain.  No n/t/burning.  Decreased ROM.  No treatment at this time.   Prostatitis: last fever two days ago.  No chills but +sweats continue.  Temperature has been below normal which is common; 97.2.  +HA last night; no ear pain or sore throat.  No rhinorrhea; coughing is better; OTC works better than [employer].  No SOB. No sputum production.  No wheezing.  No n/v/d. No abdominal pain.  Urine is normal color.  Small amount of penile discharge.  Urine culture +.   Review of Systems  Constitutional: Positive for chills and fever. Negative for activity change, appetite change, diaphoresis and fatigue.  HENT: Negative for congestion, ear pain, rhinorrhea, sinus pressure, sore throat and voice change.   Respiratory: Positive for cough. Negative for shortness of breath and wheezing.   Cardiovascular: Negative for chest pain, palpitations and leg swelling.  Gastrointestinal: Negative for nausea, vomiting, abdominal pain and diarrhea.  Endocrine: Negative for cold intolerance, heat intolerance, polydipsia, polyphagia and polyuria.  Genitourinary: Positive for discharge. Negative for decreased urine volume, dysuria, flank pain, frequency, genital sores, hematuria, penile pain, penile swelling, scrotal swelling, testicular pain and urgency.  Musculoskeletal: Positive for arthralgias and myalgias. Negative for neck pain and neck stiffness.  Skin: Negative for color change, rash and wound.  Neurological: Positive for headaches. Negative  for dizziness, tremors, seizures, syncope, facial asymmetry, speech difficulty, weakness, light-headedness and numbness.  Psychiatric/Behavioral: Negative for sleep disturbance and dysphoric mood. The patient is not nervous/anxious.     Past Medical History:  Diagnosis Date  . Complete heart block [facility]) June 2009   a. Presented with hypotension/pulm edema 12/2007 when HOCM was also discovered. s/p dual chamber MDT pacemaker. Thallium stest 7/09 negative for infiltrative disease or ischemia.  Followed Dr [name]  Marland Kitchen Heart murmur   . HOCM (hypertrophic obstructive cardiomyopathy) ([place]) 2009   Discovered by ECHO when he developed CHB. Mgmt Dr Caryl Comes  and Dr [name] at [facility]. Has outflow obstruction on ECHO 2011  . Multiple sclerosis ([place]) 1998   Sxs started 1998 with L body numbness. MRI c/w MS. Started Avonex. Saw Dr [name] at [facility] and switched to Betaseron and was on for 5 yrs but developed depression and site rxns and D/C'd 2006. While on Betaseron, occassionally required solumedrol for flares.  . Prostate enlargement   . Right bundle branch block   . SOB (shortness of breath)    a. PFTs 09/2010 essentially normal per pulm note (mild obst defect). b. 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  . 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 Pacer reprogrammed with improvement in sx.   Past Surgical History:  Procedure Laterality Date  . LEFT HEART CATHETERIZATION WITH CORONARY ANGIOGRAM N/A 08/22/2013   Procedure: LEFT HEART CATHETERIZATION WITH CORONARY ANGIOGRAM;  Surgeon: Mallie Mussel  Carlye Grippe, MD;  Location: [facility] CATH LAB;  Service: Cardiovascular;  Laterality: N/A;  . LEG TENDON SURGERY  2008   Os peroneus resection and peroneal tendon repair  . pace Agricultural engineer    . PACEMAKER INSERTION  12/2007   Complete heart block  . PACEMAKER LEAD REMOVAL Left  07/18/2015   Procedure: ATRIAL PACEMAKER LEAD EXTRACTION; ATRIAL PACEMAKER LEAD INSERTION; GENERATOR REPLACEMENT.;  Surgeon: [name], MD;  Location: [facility];  Service: Cardiovascular;  Laterality: Left;  [name] to back up  . PERIPHERAL VASCULAR CATHETERIZATION N/A 02/12/2015   Procedure: Venogram;  Surgeon: [name], MD;  Location: [place] CV LAB;  Service: Cardiovascular;  Laterality: N/A;  . TEE WITHOUT CARDIOVERSION N/A 07/18/2015   Procedure: TRANSESOPHAGEAL ECHOCARDIOGRAM (TEE);  Surgeon: [name], MD;  Location: [facility] OR;  Service: Cardiovascular;  Laterality: N/A;   No Known Allergies  Social History   Social History  . Marital status: Married    Spouse name: N/A  . Number of children: 0  . Years of education: N/A   Occupational History  . professor [employer]   Social History Main Topics  . Smoking status: Never Smoker  . Smokeless tobacco: Never Used  . Alcohol use No  . Drug use: No  . Sexual activity: Not on file   Other Topics Concern  . 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   Family History  Problem Relation Age of Onset  . Heart disease Neg Hx   . Colon polyps Father   . Hypertension Father   . Diabetes Paternal Grandmother        Objective:    BP 110/68   Pulse 71   Temp 97.8 F (36.6 C) (Oral)   Resp 16   Ht 5\' 10"  (1.778 m)   Wt 142 lb (64.4 kg)   SpO2 100%   BMI 20.37 kg/m  Physical Exam  Constitutional: He is oriented to person, place, and time. He appears well-developed and well-nourished. No distress.  HENT:  Head: Normocephalic and atraumatic.  Right Ear: External ear normal.  Left Ear: External ear normal.  Nose: Nose normal.  Mouth/Throat: Oropharynx is clear and moist.  Eyes: Conjunctivae and EOM are normal. Pupils are equal, round, and reactive to light.  Neck: Normal range of motion. Neck supple. Carotid bruit is not present. No thyromegaly present.    Cardiovascular: Normal rate, regular rhythm, normal heart sounds and intact distal pulses.  Exam reveals no gallop and no friction rub.   No murmur heard. Pulmonary/Chest: Effort normal and breath sounds normal. He has no wheezes. He has no rales.  Abdominal: Soft. Bowel sounds are normal. He exhibits no distension and no mass. There is no tenderness. There is no rebound and no guarding.  Musculoskeletal:       Left shoulder: He exhibits decreased range of motion and pain. He exhibits no tenderness, no bony tenderness, no swelling, no spasm, normal pulse and normal strength.       Cervical back: Normal. He exhibits normal range of motion, no tenderness, no bony tenderness, no swelling and no edema.  L shoulder:  +pain with elevation of shoulder above 90 degrees; passively can elevate shoulder to 150 degrees.  Cross over positive.  Empty can sign positive.    Lymphadenopathy:    He has no cervical adenopathy.  Neurological: He is alert and oriented to person, place, and time. No cranial  nerve deficit.  Skin: Skin is warm and dry. No rash noted. He is not diaphoretic.  Psychiatric: He has a normal mood and affect. His behavior is normal.  Nursing note and vitals reviewed.  Results for orders placed or performed in visit on 01/03/16  POCT CBC  Result Value Ref Range   WBC 10.5 (A) 4.6 - 10.2 K/uL   Lymph, poc 1.6 0.6 - 3.4   POC LYMPH PERCENT 15.0 10 - 50 %L   MID (cbc) 0.7 0 - 0.9   POC MID % 6.4 0 - 12 %M   POC Granulocyte 8.3 (A) 2 - 6.9   Granulocyte percent 78.6 37 - 80 %G   RBC 4.14 (A) 4.69 - 6.13 M/uL   Hemoglobin 13.1 (A) 14.1 - 18.1 g/dL   HCT, POC 36.4 (A) 43.5 - 53.7 %   MCV 88.0 80 - 97 fL   MCH, POC 31.8 (A) 27 - 31.2 pg   MCHC 36.1 (A) 31.8 - 35.4 g/dL   RDW, POC 11.8 %   Platelet Count, POC 190 142 - 424 K/uL   MPV 8.2 0 - 99.8 fL       Assessment & Plan:   1. Rotator cuff tendonitis, left   2. Acute prostatitis   3. Cough   4. Multiple sclerosis ([place])   5.  BPH (benign prostatic hyperplasia)    -New L shoulder tendonitis presumed secondary to Levaquin side effect; no recent injury or overuse as suffering with acute illness. -stop Levaquin; rx for Augmentin provided which will appropriately treat prostatitis/UTI and respiratory illness. -recommend rest, icing, home exercise program for L shoulder tendonitis. Pt declined xray today; if no improvement in 1-2 weeks, will warrant xray and follow-up with Dr. Garth [facility].   No orders of the defined types were placed in this encounter.  Meds ordered this encounter  Medications  . amoxicillin-clavulanate (AUGMENTIN) 875-125 MG tablet    Sig: Take 1 tablet by mouth 2 (two) times daily.    Dispense:  30 tablet    Refill:  0    No Follow-up on file.    Lesia Monica Elayne Guerin, M.D. [facility] [address] [place], [place]  [address] [phone] phone [phone] fax

## 2016-01-06 NOTE — Patient Instructions (Addendum)
   IF you received an x-ray today, you will receive an invoice from [facility]. Please contact [facility] at [phone] with questions or concerns regarding your invoice.   IF you received labwork today, you will receive an invoice from [facility]. Please contact [facility] at [phone] with questions or concerns regarding your invoice.   Our billing staff will not be able to assist you with questions regarding bills from these companies.  You will be contacted with the lab results as soon as they are available. The fastest way to get your results is to activate your My Chart account. Instructions are located on the last page of this paperwork. If you have not heard from us regarding the results in 2 weeks, please contact this office.    Impingement Syndrome, Rotator Cuff, Bursitis With Rehab Impingement syndrome is a condition that involves inflammation of the tendons of the rotator cuff and the subacromial bursa, that causes pain in the shoulder. The rotator cuff consists of four tendons and muscles that control much of the shoulder and upper arm function. The subacromial bursa is a fluid filled sac that helps reduce friction between the rotator cuff and one of the bones of the shoulder (acromion). Impingement syndrome is usually an overuse injury that causes swelling of the bursa (bursitis), swelling of the tendon (tendonitis), and/or a tear of the tendon (strain). Strains are classified into three categories. Grade 1 strains cause pain, but the tendon is not lengthened. Grade 2 strains include a lengthened ligament, due to the ligament being stretched or partially ruptured. With grade 2 strains there is still function, although the function may be decreased. Grade 3 strains include a complete tear of the tendon or muscle, and function is usually impaired. SYMPTOMS   Pain around the shoulder, often at the outer portion of the upper arm.  Pain  that gets worse with shoulder function, especially when reaching overhead or lifting.  Sometimes, aching when not using the arm.  Pain that wakes you up at night.  Sometimes, tenderness, swelling, warmth, or redness over the affected area.  Loss of strength.  Limited motion of the shoulder, especially reaching behind the back (to the back pocket or to unhook bra) or across your body.  Crackling sound (crepitation) when moving the arm.  Biceps tendon pain and inflammation (in the front of the shoulder). Worse when bending the elbow or lifting. CAUSES  Impingement syndrome is often an overuse injury, in which chronic (repetitive) motions cause the tendons or bursa to become inflamed. A strain occurs when a force is paced on the tendon or muscle that is greater than it can withstand. Common mechanisms of injury include: Stress from sudden increase in duration, frequency, or intensity of training.  Direct hit (trauma) to the shoulder.  Aging, erosion of the tendon with normal use.  Bony bump on shoulder (acromial spur). RISK INCREASES WITH:  Contact sports (football, wrestling, boxing).  Throwing sports (baseball, tennis, volleyball).  Weightlifting and bodybuilding.  Heavy labor.  Previous injury to the rotator cuff, including impingement.  Poor shoulder strength and flexibility.  Failure to warm up properly before activity.  Inadequate protective equipment.  Old age.  Bony bump on shoulder (acromial spur). PREVENTION   Warm up and stretch properly before activity.  Allow for adequate recovery between workouts.  Maintain physical fitness:  Strength, flexibility, and endurance.  Cardiovascular fitness.  Learn and use proper exercise technique. PROGNOSIS  If treated properly, impingement syndrome usually goes   away within 6 weeks. Sometimes surgery is required.  RELATED COMPLICATIONS   Longer healing time if not properly treated, or if not given enough time to  heal.  Recurring symptoms, that result in a chronic condition.  Shoulder stiffness, frozen shoulder, or loss of motion.  Rotator cuff tendon tear.  Recurring symptoms, especially if activity is resumed too soon, with overuse, with a direct blow, or when using poor technique. TREATMENT  Treatment first involves the use of ice and medicine, to reduce pain and inflammation. The use of strengthening and stretching exercises may help reduce pain with activity. These exercises may be performed at home or with a therapist. If non-surgical treatment is unsuccessful after more than 6 months, surgery may be advised. After surgery and rehabilitation, activity is usually possible in 3 months.  MEDICATION  If pain medicine is needed, nonsteroidal anti-inflammatory medicines (aspirin and ibuprofen), or other minor pain relievers (acetaminophen), are often advised.  Do not take pain medicine for 7 days before surgery.  Prescription pain relievers may be given, if your caregiver thinks they are needed. Use only as directed and only as much as you need.  Corticosteroid injections may be given by your caregiver. These injections should be reserved for the most serious cases, because they may only be given a certain number of times. HEAT AND COLD  Cold treatment (icing) should be applied for 10 to 15 minutes every 2 to 3 hours for inflammation and pain, and immediately after activity that aggravates your symptoms. Use ice packs or an ice massage.  Heat treatment may be used before performing stretching and strengthening activities prescribed by your caregiver, physical therapist, or athletic trainer. Use a heat [place] or a warm water soak. SEEK MEDICAL CARE IF:   Symptoms get worse or do not improve in 4 to 6 weeks, despite treatment.  New, unexplained symptoms develop. (Drugs used in treatment may produce side effects.) EXERCISES  RANGE OF MOTION (ROM) AND STRETCHING EXERCISES - Impingement Syndrome  (Rotator Cuff  Tendinitis, Bursitis) These exercises may help you when beginning to rehabilitate your injury. Your symptoms may go away with or without further involvement from your physician, physical therapist or athletic trainer. While completing these exercises, remember:   Restoring tissue flexibility helps normal motion to return to the joints. This allows healthier, less painful movement and activity.  An effective stretch should be held for at least 30 seconds.  A stretch should never be painful. You should only feel a gentle lengthening or release in the stretched tissue. STRETCH - Flexion, Standing  Stand with good posture. With an underhand grip on your right / left hand, and an overhand grip on the opposite hand, grasp a broomstick or cane so that your hands are a little more than shoulder width apart.  Keeping your right / left elbow straight and shoulder muscles relaxed, push the stick with your opposite hand, to raise your right / left arm in front of your body and then overhead. Raise your arm until you feel a stretch in your right / left shoulder, but before you have increased shoulder pain.  Try to avoid shrugging your right / left shoulder as your arm rises, by keeping your shoulder blade tucked down and toward your mid-back spine. Hold for __________ seconds.  Slowly return to the starting position. Repeat __________ times. Complete this exercise __________ times per day. STRETCH - Abduction, Supine  Lie on your back. With an underhand grip on your right / left hand and   an overhand grip on the opposite hand, grasp a broomstick or cane so that your hands are a little more than shoulder width apart.  Keeping your right / left elbow straight and your shoulder muscles relaxed, push the stick with your opposite hand, to raise your right / left arm out to the side of your body and then overhead. Raise your arm until you feel a stretch in your right / left shoulder, but before you  have increased shoulder pain.  Try to avoid shrugging your right / left shoulder as your arm rises, by keeping your shoulder blade tucked down and toward your mid-back spine. Hold for __________ seconds.  Slowly return to the starting position. Repeat __________ times. Complete this exercise __________ times per day. ROM - Flexion, Active-Assisted  Lie on your back. You may bend your knees for comfort.  Grasp a broomstick or cane so your hands are about shoulder width apart. Your right / left hand should grip the end of the stick, so that your hand is positioned "thumbs-up," as if you were about to shake hands.  Using your healthy arm to lead, raise your right / left arm overhead, until you feel a gentle stretch in your shoulder. Hold for __________ seconds.  Use the stick to assist in returning your right / left arm to its starting position. Repeat __________ times. Complete this exercise __________ times per day.  ROM - Internal Rotation, Supine   Lie on your back on a firm surface. Place your right / left elbow about 60 degrees away from your side. Elevate your elbow with a folded towel, so that the elbow and shoulder are the same height.  Using a broomstick or cane and your strong arm, pull your right / left hand toward your body until you feel a gentle stretch, but no increase in your shoulder pain. Keep your shoulder and elbow in place throughout the exercise.  Hold for __________ seconds. Slowly return to the starting position. Repeat __________ times. Complete this exercise __________ times per day. STRETCH - Internal Rotation  Place your right / left hand behind your back, palm up.  Throw a towel or belt over your opposite shoulder. Grasp the towel with your right / left hand.  While keeping an upright posture, gently pull up on the towel, until you feel a stretch in the front of your right / left shoulder.  Avoid shrugging your right / left shoulder as your arm rises, by  keeping your shoulder blade tucked down and toward your mid-back spine.  Hold for __________ seconds. Release the stretch, by lowering your healthy hand. Repeat __________ times. Complete this exercise __________ times per day. ROM - Internal Rotation   Using an underhand grip, grasp a stick behind your back with both hands.  While standing upright with good posture, slide the stick up your back until you feel a mild stretch in the front of your shoulder.  Hold for __________ seconds. Slowly return to your starting position. Repeat __________ times. Complete this exercise __________ times per day.  STRETCH - Posterior Shoulder Capsule   Stand or sit with good posture. Grasp your right / left elbow and draw it across your chest, keeping it at the same height as your shoulder.  Pull your elbow, so your upper arm comes in closer to your chest. Pull until you feel a gentle stretch in the back of your shoulder.  Hold for __________ seconds. Repeat __________ times. Complete this exercise __________ times per   day. STRENGTHENING EXERCISES - Impingement Syndrome (Rotator Cuff Tendinitis, Bursitis) These exercises may help you when beginning to rehabilitate your injury. They may resolve your symptoms with or without further involvement from your physician, physical therapist or athletic trainer. While completing these exercises, remember:  Muscles can gain both the endurance and the strength needed for everyday activities through controlled exercises.  Complete these exercises as instructed by your physician, physical therapist or athletic trainer. Increase the resistance and repetitions only as guided.  You may experience muscle soreness or fatigue, but the pain or discomfort you are trying to eliminate should never worsen during these exercises. If this pain does get worse, stop and make sure you are following the directions exactly. If the pain is still present after adjustments, discontinue the  exercise until you can discuss the trouble with your clinician.  During your recovery, avoid activity or exercises which involve actions that place your injured hand or elbow above your head or behind your back or head. These positions stress the tissues which you are trying to heal. STRENGTH - Scapular Depression and Adduction   With good posture, sit on a firm chair. Support your arms in front of you, with pillows, arm rests, or on a table top. Have your elbows in line with the sides of your body.  Gently draw your shoulder blades down and toward your mid-back spine. Gradually increase the tension, without tensing the muscles along the top of your shoulders and the back of your neck.  Hold for __________ seconds. Slowly release the tension and relax your muscles completely before starting the next repetition.  After you have practiced this exercise, remove the arm support and complete the exercise in standing as well as sitting position. Repeat __________ times. Complete this exercise __________ times per day.  STRENGTH - Shoulder Abductors, Isometric  With good posture, stand or sit about 4-6 inches from a wall, with your right / left side facing the wall.  Bend your right / left elbow. Gently press your right / left elbow into the wall. Increase the pressure gradually, until you are pressing as hard as you can, without shrugging your shoulder or increasing any shoulder discomfort.  Hold for __________ seconds.  Release the tension slowly. Relax your shoulder muscles completely before you begin the next repetition. Repeat __________ times. Complete this exercise __________ times per day.  STRENGTH - External Rotators, Isometric  Keep your right / left elbow at your side and bend it 90 degrees.  Step into a door frame so that the outside of your right / left wrist can press against the door frame without your upper arm leaving your side.  Gently press your right / left wrist into the  door frame, as if you were trying to swing the back of your hand away from your stomach. Gradually increase the tension, until you are pressing as hard as you can, without shrugging your shoulder or increasing any shoulder discomfort.  Hold for __________ seconds.  Release the tension slowly. Relax your shoulder muscles completely before you begin the next repetition. Repeat __________ times. Complete this exercise __________ times per day.  STRENGTH - Supraspinatus   Stand or sit with good posture. Grasp a __________ weight, or an exercise band or tubing, so that your hand is "thumbs-up," like you are shaking hands.  Slowly lift your right / left arm in a "V" away from your thigh, diagonally into the space between your side and straight ahead. Lift your hand to   shoulder height or as far as you can, without increasing any shoulder pain. At first, many people do not lift their hands above shoulder height.  Avoid shrugging your right / left shoulder as your arm rises, by keeping your shoulder blade tucked down and toward your mid-back spine.  Hold for __________ seconds. Control the descent of your hand, as you slowly return to your starting position. Repeat __________ times. Complete this exercise __________ times per day.  STRENGTH - External Rotators  Secure a rubber exercise band or tubing to a fixed object (table, pole) so that it is at the same height as your right / left elbow when you are standing or sitting on a firm surface.  Stand or sit so that the secured exercise band is at your uninjured side.  Bend your right / left elbow 90 degrees. Place a folded towel or small pillow under your right / left arm, so that your elbow is a few inches away from your side.  Keeping the tension on the exercise band, pull it away from your body, as if pivoting on your elbow. Be sure to keep your body steady, so that the movement is coming only from your rotating shoulder.  Hold for __________  seconds. Release the tension in a controlled manner, as you return to the starting position. Repeat __________ times. Complete this exercise __________ times per day.  STRENGTH - Internal Rotators   Secure a rubber exercise band or tubing to a fixed object (table, pole) so that it is at the same height as your right / left elbow when you are standing or sitting on a firm surface.  Stand or sit so that the secured exercise band is at your right / left side.  Bend your elbow 90 degrees. Place a folded towel or small pillow under your right / left arm so that your elbow is a few inches away from your side.  Keeping the tension on the exercise band, pull it across your body, toward your stomach. Be sure to keep your body steady, so that the movement is coming only from your rotating shoulder.  Hold for __________ seconds. Release the tension in a controlled manner, as you return to the starting position. Repeat __________ times. Complete this exercise __________ times per day.  STRENGTH - Scapular Protractors, Standing   Stand arms length away from a wall. Place your hands on the wall, keeping your elbows straight.  Begin by dropping your shoulder blades down and toward your mid-back spine.  To strengthen your protractors, keep your shoulder blades down, but slide them forward on your rib cage. It will feel as if you are lifting the back of your rib cage away from the wall. This is a subtle motion and can be challenging to complete. Ask your caregiver for further instruction, if you are not sure you are doing the exercise correctly.  Hold for __________ seconds. Slowly return to the starting position, resting the muscles completely before starting the next repetition. Repeat __________ times. Complete this exercise __________ times per day. STRENGTH - Scapular Protractors, Supine  Lie on your back on a firm surface. Extend your right / left arm straight into the air while holding a __________  weight in your hand.  Keeping your head and back in place, lift your shoulder off the floor.  Hold for __________ seconds. Slowly return to the starting position, and allow your muscles to relax completely before starting the next repetition. Repeat __________ times. Complete this   exercise __________ times per day. STRENGTH - Scapular Protractors, Quadruped  Get onto your hands and knees, with your shoulders directly over your hands (or as close as you can be, comfortably).  Keeping your elbows locked, lift the back of your rib cage up into your shoulder blades, so your mid-back rounds out. Keep your neck muscles relaxed.  Hold this position for __________ seconds. Slowly return to the starting position and allow your muscles to relax completely before starting the next repetition. Repeat __________ times. Complete this exercise __________ times per day.  STRENGTH - Scapular Retractors  Secure a rubber exercise band or tubing to a fixed object (table, pole), so that it is at the height of your shoulders when you are either standing, or sitting on a firm armless chair.  With a palm down grip, grasp an end of the band in each hand. Straighten your elbows and lift your hands straight in front of you, at shoulder height. Step back, away from the secured end of the band, until it becomes tense.  Squeezing your shoulder blades together, draw your elbows back toward your sides, as you bend them. Keep your upper arms lifted away from your body throughout the exercise.  Hold for __________ seconds. Slowly ease the tension on the band, as you reverse the directions and return to the starting position. Repeat __________ times. Complete this exercise __________ times per day. STRENGTH - Shoulder Extensors   Secure a rubber exercise band or tubing to a fixed object (table, pole) so that it is at the height of your shoulders when you are either standing, or sitting on a firm armless chair.  With a  thumbs-up grip, grasp an end of the band in each hand. Straighten your elbows and lift your hands straight in front of you, at shoulder height. Step back, away from the secured end of the band, until it becomes tense.  Squeezing your shoulder blades together, pull your hands down to the sides of your thighs. Do not allow your hands to go behind you.  Hold for __________ seconds. Slowly ease the tension on the band, as you reverse the directions and return to the starting position. Repeat __________ times. Complete this exercise __________ times per day.  STRENGTH - Scapular Retractors and External Rotators   Secure a rubber exercise band or tubing to a fixed object (table, pole) so that it is at the height as your shoulders, when you are either standing, or sitting on a firm armless chair.  With a palm down grip, grasp an end of the band in each hand. Bend your elbows 90 degrees and lift your elbows to shoulder height, at your sides. Step back, away from the secured end of the band, until it becomes tense.  Squeezing your shoulder blades together, rotate your shoulders so that your upper arms and elbows remain stationary, but your fists travel upward to head height.  Hold for __________ seconds. Slowly ease the tension on the band, as you reverse the directions and return to the starting position. Repeat __________ times. Complete this exercise __________ times per day.  STRENGTH - Scapular Retractors and External Rotators, Rowing   Secure a rubber exercise band or tubing to a fixed object (table, pole) so that it is at the height of your shoulders, when you are either standing, or sitting on a firm armless chair.  With a palm down grip, grasp an end of the band in each hand. Straighten your elbows and lift your hands   straight in front of you, at shoulder height. Step back, away from the secured end of the band, until it becomes tense.  Step 1: Squeeze your shoulder blades together. Bending  your elbows, draw your hands to your chest, as if you are rowing a boat. At the end of this motion, your hands and elbow should be at shoulder height and your elbows should be out to your sides.  Step 2: Rotate your shoulders, to raise your hands above your head. Your forearms should be vertical and your upper arms should be horizontal.  Hold for __________ seconds. Slowly ease the tension on the band, as you reverse the directions and return to the starting position. Repeat __________ times. Complete this exercise __________ times per day.  STRENGTH - Scapular Depressors  Find a sturdy chair without wheels, such as a dining room chair.  Keeping your feet on the floor, and your hands on the chair arms, lift your bottom up from the seat, and lock your elbows.  Keeping your elbows straight, allow gravity to pull your body weight down. Your shoulders will rise toward your ears.  Raise your body against gravity by drawing your shoulder blades down your back, shortening the distance between your shoulders and ears. Although your feet should always maintain contact with the floor, your feet should progressively support less body weight, as you get stronger.  Hold for __________ seconds. In a controlled and slow manner, lower your body weight to begin the next repetition. Repeat __________ times. Complete this exercise __________ times per day.    This information is not intended to replace advice given to you by your health care provider. Make sure you discuss any questions you have with your health care provider.   Document Released: 06/22/2005 Document Revised: 07/13/2014 Document Reviewed: 10/04/2008 [employer] Interactive Patient Education 2016 [employer]   

## 2016-02-02 DIAGNOSIS — N400 Enlarged prostate without lower urinary tract symptoms: Secondary | ICD-10-CM | POA: Insufficient documentation

## 2016-02-13 ENCOUNTER — Telehealth: Payer: Self-pay | Admitting: Cardiology

## 2016-02-13 ENCOUNTER — Ambulatory Visit (INDEPENDENT_AMBULATORY_CARE_PROVIDER_SITE_OTHER): Payer: BC Managed Care – PPO | Admitting: *Deleted

## 2016-02-13 DIAGNOSIS — I442 Atrioventricular block, complete: Secondary | ICD-10-CM

## 2016-02-13 NOTE — Telephone Encounter (Signed)
LMOVM reminding pt to send remote transmission.   

## 2016-02-13 NOTE — Progress Notes (Signed)
Remote pacemaker transmission.   

## 2016-02-14 DIAGNOSIS — N210 Calculus in bladder: Secondary | ICD-10-CM | POA: Insufficient documentation

## 2016-02-17 LAB — CUP PACEART REMOTE DEVICE CHECK
Battery Voltage: 3.02 V
Brady Statistic AP VP Percent: 14.08 %
Brady Statistic AP VS Percent: 0 %
Brady Statistic AS VP Percent: 85.91 %
Brady Statistic AS VS Percent: 0 %
Date Time Interrogation Session: 20170810172220
Implantable Lead Implant Date: 20170112
Implantable Lead Model: 5076
Implantable Lead Model: 5076
Lead Channel Impedance Value: 418 Ohm
Lead Channel Impedance Value: 513 Ohm
Lead Channel Pacing Threshold Amplitude: 1 V
Lead Channel Pacing Threshold Pulse Width: 0.4 ms
Lead Channel Pacing Threshold Pulse Width: 0.4 ms
Lead Channel Sensing Intrinsic Amplitude: 1.875 mV
Lead Channel Setting Pacing Amplitude: 2.5 V
Lead Channel Setting Pacing Pulse Width: 0.4 ms
Lead Channel Setting Sensing Sensitivity: 5.6 mV
MDC IDC LEAD IMPLANT DT: 20090608
MDC IDC LEAD LOCATION: 753859
MDC IDC LEAD LOCATION: 753860
MDC IDC MSMT BATTERY REMAINING LONGEVITY: 104 mo
MDC IDC MSMT LEADCHNL RA IMPEDANCE VALUE: 494 Ohm
MDC IDC MSMT LEADCHNL RA PACING THRESHOLD AMPLITUDE: 0.375 V
MDC IDC MSMT LEADCHNL RA SENSING INTR AMPL: 1.875 mV
MDC IDC MSMT LEADCHNL RV IMPEDANCE VALUE: 418 Ohm
MDC IDC SET LEADCHNL RA PACING AMPLITUDE: 2 V
MDC IDC STAT BRADY RA PERCENT PACED: 14.08 %
MDC IDC STAT BRADY RV PERCENT PACED: 100 %

## 2016-02-18 ENCOUNTER — Encounter: Payer: Self-pay | Admitting: Cardiology

## 2016-03-27 ENCOUNTER — Encounter: Payer: Self-pay | Admitting: Internal Medicine

## 2016-04-30 ENCOUNTER — Encounter: Payer: Self-pay | Admitting: Internal Medicine

## 2016-05-12 ENCOUNTER — Encounter: Payer: BC Managed Care – PPO | Admitting: Internal Medicine

## 2016-05-13 ENCOUNTER — Encounter: Payer: Self-pay | Admitting: Internal Medicine

## 2016-05-13 ENCOUNTER — Ambulatory Visit (INDEPENDENT_AMBULATORY_CARE_PROVIDER_SITE_OTHER): Payer: BC Managed Care – PPO | Admitting: Internal Medicine

## 2016-05-13 VITALS — BP 106/66 | HR 72 | Ht 70.0 in | Wt 147.0 lb

## 2016-05-13 DIAGNOSIS — I422 Other hypertrophic cardiomyopathy: Secondary | ICD-10-CM | POA: Diagnosis not present

## 2016-05-13 DIAGNOSIS — Z950 Presence of cardiac pacemaker: Secondary | ICD-10-CM | POA: Diagnosis not present

## 2016-05-13 DIAGNOSIS — I442 Atrioventricular block, complete: Secondary | ICD-10-CM | POA: Diagnosis not present

## 2016-05-13 LAB — CUP PACEART INCLINIC DEVICE CHECK
Battery Remaining Longevity: 98 mo
Battery Voltage: 3.01 V
Brady Statistic AP VP Percent: 16.62 %
Brady Statistic AS VS Percent: 0 %
Brady Statistic RA Percent Paced: 16.62 %
Date Time Interrogation Session: 20171108105952
Implantable Lead Implant Date: 20090608
Implantable Lead Location: 753860
Implantable Lead Model: 5076
Implantable Pulse Generator Implant Date: 20170112
Lead Channel Impedance Value: 418 Ohm
Lead Channel Pacing Threshold Amplitude: 1.25 V
Lead Channel Pacing Threshold Pulse Width: 0.4 ms
Lead Channel Sensing Intrinsic Amplitude: 1.75 mV
Lead Channel Sensing Intrinsic Amplitude: 2.375 mV
MDC IDC LEAD IMPLANT DT: 20170112
MDC IDC LEAD LOCATION: 753859
MDC IDC MSMT LEADCHNL RA IMPEDANCE VALUE: 551 Ohm
MDC IDC MSMT LEADCHNL RA PACING THRESHOLD AMPLITUDE: 0.5 V
MDC IDC MSMT LEADCHNL RV IMPEDANCE VALUE: 437 Ohm
MDC IDC MSMT LEADCHNL RV IMPEDANCE VALUE: 532 Ohm
MDC IDC MSMT LEADCHNL RV PACING THRESHOLD PULSEWIDTH: 0.4 ms
MDC IDC SET LEADCHNL RA PACING AMPLITUDE: 2 V
MDC IDC SET LEADCHNL RV PACING AMPLITUDE: 2.5 V
MDC IDC SET LEADCHNL RV PACING PULSEWIDTH: 0.4 ms
MDC IDC SET LEADCHNL RV SENSING SENSITIVITY: 5.6 mV
MDC IDC STAT BRADY AP VS PERCENT: 0 %
MDC IDC STAT BRADY AS VP PERCENT: 83.38 %
MDC IDC STAT BRADY RV PERCENT PACED: 99.99 %

## 2016-05-13 NOTE — Patient Instructions (Addendum)
Medication Instructions: - Your physician recommends that you continue on your current medications as directed. Please refer to the Current Medication list given to you today.  Labwork: - none ordered  Procedures/Testing: - none ordered  Follow-Up: - Remote monitoring is used to monitor your Pacemaker of ICD from home. This monitoring reduces the number of office visits required to check your device to one time per year. It allows [place] to keep an eye on the functioning of your device to ensure it is working properly. You are scheduled for a device check from home on 08/12/16. You may send your transmission at any time that day. If you have a wireless device, the transmission will be sent automatically. After your physician reviews your transmission, you will receive a postcard with your next transmission date.  - Your physician wants you to follow-up in: 6 months with Dr. [name] You will receive a reminder letter in the mail two months in advance. If you don't receive a letter, please call our office to schedule the follow-up appointment.  Any Additional Special Instructions Will Be Listed Below (If Applicable).     If you need a refill on your cardiac medications before your next appointment, please call your pharmacy.

## 2016-05-13 NOTE — Progress Notes (Signed)
Mostly inferior skf      Patient Care Team: Harlan Stains, MD as PCP - General (Family Medicine) Gatha Mayer, MD as Consulting Physician (Gastroenterology) [name], MD as Consulting Physician (Cardiology)   HPI  Rodney Adkins is a 60 y.o. male Seen in followup for HTN with complete heart block status post pacing. He has been seen again recently because of exercise intolerance previously had been associated with upper rate Wenckebach behavior. Because of episodes of chest pain he underwent catheterization demonstrating no obstructive coronary disease but there were gradients.LVEDP>>5  He had a broken atrial lead. He saw both Dr. Elliot Cousin and [facility] and elected to have his extraction done here. It was done by Dr. Lovena Le 1/17  He continues  to struggle with dyspnea.and 4/17 underwent  cardiopulmonary stress testing which was reviewed. He describes orthostatic hypotensive blood pressure responses; heart rate peaked 158; peak blood pressures recorded 140. 4/17 Echo LVEF 60-65% with gradeint resting 29  Not  Exercised.   We will also issues related to neurogenic bladder associated with his multiple sclerosis. The recommendation was to use alpha blockers. Given his issues of outflow tract obstruction I was in touch with Dr. [email or web address]  who thought low-dose alpha blockers might be reasonable.  These were attempted and were without success. He is now contemplating surgery.    Past Medical History:  Diagnosis Date  . Complete heart block [facility]) June 2009   a. Presented with hypotension/pulm edema 12/2007 when HOCM was also discovered. s/p dual chamber MDT pacemaker. Thallium stest 7/09 negative for infiltrative disease or ischemia.  Followed Dr 20/09.  Caryl Comes Heart murmur   . HOCM (hypertrophic obstructive cardiomyopathy) ([place]) 2009   Discovered by ECHO when he developed CHB. Mgmt Dr 2010  and Dr Caryl Comes at [facility]. Has outflow obstruction on ECHO 2011  . Multiple sclerosis ([place]) 1998   Sxs started 1998  with L body numbness. MRI c/w MS. Started Avonex. Saw Dr 1999 at [facility] and switched to Betaseron and was on for 5 yrs but developed depression and site rxns and D/C'd 2006. While on Betaseron, occassionally required solumedrol for flares.  . Prostate enlargement   . Right bundle branch block   . SOB (shortness of breath)    a. PFTs 09/2010 essentially normal per pulm note (mild obst defect). b. 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  . 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 Pacer reprogrammed with improvement in sx.    Past Surgical History:  Procedure Laterality Date  . LEFT HEART CATHETERIZATION WITH CORONARY ANGIOGRAM N/A 08/22/2013   Procedure: LEFT HEART CATHETERIZATION WITH CORONARY ANGIOGRAM;  Surgeon: 09/04/2013, MD;  Location: [facility] CATH LAB;  Service: Cardiovascular;  Laterality: N/A;  . LEG TENDON SURGERY  2008   Os peroneus resection and peroneal tendon repair  . pace 2009    . PACEMAKER INSERTION  12/2007   Complete heart block  . PACEMAKER LEAD REMOVAL Left 07/18/2015   Procedure: ATRIAL PACEMAKER LEAD EXTRACTION; ATRIAL PACEMAKER LEAD INSERTION; GENERATOR REPLACEMENT.;  Surgeon: 14/06/2016, MD;  Location: [facility];  Service: Cardiovascular;  Laterality: Left;  [name] to back up  . PERIPHERAL VASCULAR CATHETERIZATION N/A 02/12/2015   Procedure: Venogram;  Surgeon: 21/03/2015, MD;  Location: [facility] CV LAB;  Service: Cardiovascular;  Laterality: N/A;  . TEE  WITHOUT CARDIOVERSION N/A 07/18/2015   Procedure: TRANSESOPHAGEAL ECHOCARDIOGRAM (TEE);  Surgeon: [name], MD;  Location: [facility] OR;  Service: Cardiovascular;  Laterality: N/A;    Current Outpatient Prescriptions  Medication Sig Dispense Refill  . Ascorbic Acid (VITAMIN C) 500 MG tablet Take 500 mg by mouth daily as needed (Vitamin c supplement).      . cholecalciferol (VITAMIN D) 1000 UNITS tablet Take 1,000 Units by mouth daily. Take 5,000 units daily    . Dimethyl Fumarate (TECFIDERA) 240 MG CPDR Take 240 mg by mouth 2 (two) times daily.    . finasteride (PROSCAR) 5 MG tablet Take 1 tablet by mouth daily as needed.    . Multiple Vitamin (MULTIVITAMIN) tablet Take 1 tablet by mouth daily.      No current facility-administered medications for this visit.     No Known Allergies  Review of Systems negative except from HPI and PMH  Physical Exam BP 106/66   Pulse 72   Ht 5\' 10"  (1.778 m)   Wt 147 lb (66.7 kg)   SpO2 97%   BMI 21.09 kg/m  Well developed and well nourished in no acute distress HENT normal E scleral and icterus clear Neck Supple JVP flat; carotids brisk and full Clear to ausculation Device pocket well healed; without hematoma or erythema.  There is no tethering   Regular rate and rhythm, 2/6 murmur enhances With Valsalva  Soft with active bowel sounds No clubbing cyanosis no  Edema Alert and oriented, grossly normal motor and sensory function Skin Warm and Dry   ECG ordered today demonstrates sinus rhythm at 62 with a synchronous pacing  Assessment and  Plan  HCM  Complete Heart Block  Pacer Medtronic The patient's device was interrogated.  The information was reviewed. No changes were made in the programming.     Dyspnea on exertion  Exercise assoc hypotension   The patient's continues to struggle with dyspnea. The differential diagnosis would include exercise associated obstruction, inappropriate vasomotor tone with hypotension associated with his HCM, conditioning, other cardiac limitations. I think the next more likely thought is outflow obstruction being enhanced with exercise. We have discussed stress echo in this regard. Given his blood pressure issues, meter disopyramide nor a calcium blocker would likely be tolerated. Hence, if his symptoms dictate and in fact outflow obstruction is the issue   myectomy would be his option.  Although I do think exercise testing with reprogramming might be able to attenuate some of his symptoms which would be an initial effort.   In the interim however, the issue of his neurogenic bladder and urinary outflow obstruction  is immediately before him. I've advised him to make sure that there is good communication between urology and neurology and that whether he has a surgery done at [facility] or [facility], that we would be in touch with cardiology said they would be available  More than 50% of 45 min was spent in counseling related to the above

## 2016-06-03 ENCOUNTER — Telehealth: Payer: Self-pay | Admitting: Internal Medicine

## 2016-06-03 NOTE — Telephone Encounter (Signed)
New Message  Pt voiced he is having a simple prostatectomy on 12.19 with MD-Raynor at [facility], theyre going to have EP at surgery and talked about slowing down pacemaker.  Pt voiced wanting to know is there anything MD-Klein would recommend or any questions the pt should ask or talk about.  Please f/u with pt

## 2016-06-04 ENCOUNTER — Encounter: Payer: Self-pay | Admitting: Internal Medicine

## 2016-06-04 NOTE — Telephone Encounter (Signed)
Good luck with surgery Their pacemaker team will be well able to handle what ever comes up Let me know how the surggery goes

## 2016-06-04 NOTE — Telephone Encounter (Addendum)
I spoke with patient. I advised of Dr. Olin Pia message. He voiced understanding and thanks.

## 2016-06-17 ENCOUNTER — Telehealth: Payer: Self-pay | Admitting: Internal Medicine

## 2016-06-17 NOTE — Telephone Encounter (Signed)
Please Follow-up on Clearance.

## 2016-06-17 NOTE — Telephone Encounter (Signed)
New message  [facility] faxed a cardiac clearance 12/13/surgery is on Tuesday 12/19  Asking for someone to follow up  Please call back

## 2016-06-18 ENCOUNTER — Encounter: Payer: Self-pay | Admitting: Internal Medicine

## 2016-06-18 NOTE — Telephone Encounter (Signed)
Rodney Adkins has paperwork for Dr. Caryl Comes to review when he is back in the [place] office this afternoon.

## 2016-06-18 NOTE — Telephone Encounter (Signed)
Follow Up:    Dr Ottis Stain called,he still waiting on the clearance.Please fax asap to [phone].

## 2016-06-19 NOTE — Telephone Encounter (Signed)
Clearance letter faxed to [place] [facility] at [phone]. Confirmation received.  I left a message for [facility] [place] [facility] [phone] that this has been sent.

## 2016-08-12 ENCOUNTER — Ambulatory Visit (INDEPENDENT_AMBULATORY_CARE_PROVIDER_SITE_OTHER): Payer: BC Managed Care – PPO | Admitting: *Deleted

## 2016-08-12 ENCOUNTER — Telehealth: Payer: Self-pay | Admitting: Cardiology

## 2016-08-12 DIAGNOSIS — I442 Atrioventricular block, complete: Secondary | ICD-10-CM

## 2016-08-12 NOTE — Telephone Encounter (Signed)
LMOVM reminding pt to send remote transmission.   

## 2016-08-13 NOTE — Progress Notes (Signed)
Remote pacemaker transmission.   

## 2016-08-14 ENCOUNTER — Encounter: Payer: Self-pay | Admitting: Cardiology

## 2016-08-14 LAB — CUP PACEART REMOTE DEVICE CHECK
Battery Remaining Longevity: 98 mo
Battery Voltage: 3.01 V
Brady Statistic AP VS Percent: 0 %
Brady Statistic AS VS Percent: 0 %
Implantable Lead Implant Date: 20090608
Implantable Lead Model: 5076
Implantable Pulse Generator Implant Date: 20170112
Lead Channel Impedance Value: 380 Ohm
Lead Channel Impedance Value: 456 Ohm
Lead Channel Pacing Threshold Amplitude: 0.5 V
Lead Channel Pacing Threshold Amplitude: 1 V
Lead Channel Pacing Threshold Pulse Width: 0.4 ms
Lead Channel Sensing Intrinsic Amplitude: 1.5 mV
Lead Channel Sensing Intrinsic Amplitude: 1.5 mV
Lead Channel Setting Sensing Sensitivity: 5.6 mV
MDC IDC LEAD IMPLANT DT: 20170112
MDC IDC LEAD LOCATION: 753859
MDC IDC LEAD LOCATION: 753860
MDC IDC MSMT LEADCHNL RA PACING THRESHOLD PULSEWIDTH: 0.4 ms
MDC IDC MSMT LEADCHNL RV IMPEDANCE VALUE: 418 Ohm
MDC IDC MSMT LEADCHNL RV IMPEDANCE VALUE: 513 Ohm
MDC IDC MSMT LEADCHNL RV SENSING INTR AMPL: 17 mV
MDC IDC MSMT LEADCHNL RV SENSING INTR AMPL: 17 mV
MDC IDC SESS DTM: 20180208020419
MDC IDC SET LEADCHNL RA PACING AMPLITUDE: 2 V
MDC IDC SET LEADCHNL RV PACING AMPLITUDE: 2.5 V
MDC IDC SET LEADCHNL RV PACING PULSEWIDTH: 0.4 ms
MDC IDC STAT BRADY AP VP PERCENT: 10.5 %
MDC IDC STAT BRADY AS VP PERCENT: 89.5 %
MDC IDC STAT BRADY RA PERCENT PACED: 10.5 %
MDC IDC STAT BRADY RV PERCENT PACED: 99.99 %

## 2016-09-01 DIAGNOSIS — Z9079 Acquired absence of other genital organ(s): Secondary | ICD-10-CM | POA: Insufficient documentation

## 2016-09-01 DIAGNOSIS — I442 Atrioventricular block, complete: Secondary | ICD-10-CM | POA: Insufficient documentation

## 2016-09-02 ENCOUNTER — Telehealth: Payer: Self-pay | Admitting: Internal Medicine

## 2016-09-02 DIAGNOSIS — I517 Cardiomegaly: Secondary | ICD-10-CM

## 2016-09-02 NOTE — Telephone Encounter (Signed)
New Message  Pt call requesting to speak with RN about getting an order for a stress echo. Please call back to discuss

## 2016-09-02 NOTE — Telephone Encounter (Signed)
Informed patient Dr. Caryl Comes and his nurse would address this tomorrow when they return to the office. He understands Nira Conn, RN will return his call tomorrow.   Pt gave another number to call if he isn't reachable at home number --  [phone]

## 2016-09-03 NOTE — Telephone Encounter (Signed)
Dr. Caryl Comes- I called and spoke with the patient's wife about scheduling the stress echo- she states that the last time you were there and the patient is requesting this again as he feels like his issues were the same.   Do you recall this? Do you need to be present?

## 2016-09-03 NOTE — Telephone Encounter (Signed)
This will need to be coordinated with Lorriane Shire as the issue will be the development of out flow tract gradient Thanks    K  We shouldn't have any problem doing this should we  ?HCM and dyspnea  Is his resting gradient worsening with exercise

## 2016-09-03 NOTE — Telephone Encounter (Signed)
vaenssa we are going to try to arrange a stress echo with your help for looking at change of gradient for this gentleman with HCM who has shortness of breath with exertion. Can you  help?

## 2016-09-04 NOTE — Telephone Encounter (Signed)
No  The last stress test was done for reprogramming of pacing   We are looking here for outflow tract obstruction   I am gkad to be though as presence

## 2016-09-09 NOTE — Telephone Encounter (Signed)
Will place order for stress order with request to be done with Lorriane Shire on a day Dr. Caryl Comes is in the office.

## 2016-09-11 ENCOUNTER — Telehealth (HOSPITAL_COMMUNITY): Payer: Self-pay | Admitting: Internal Medicine

## 2016-09-16 NOTE — Telephone Encounter (Signed)
  09/11/2016 03:10 PM Phone (Outgoing) Rodney Adkins (Self)    No Answer/Busy - Called ptto get him scheduled for stress echo on the same day as Dr. Caryl Comes was in the office. He has no voicemail set up.    By Verdene Rio

## 2016-09-30 ENCOUNTER — Telehealth (HOSPITAL_COMMUNITY): Payer: Self-pay | Admitting: *Deleted

## 2016-09-30 NOTE — Telephone Encounter (Signed)
Patient given detailed instructions per Stress Test Requisition Sheet for test on 10/08/16 at 2:30.Patient Notified to arrive 30 minutes early, and that it is imperative to arrive on time for appointment to keep from having the test rescheduled.  Patient verbalized understanding. Rodney Adkins

## 2016-10-08 ENCOUNTER — Ambulatory Visit (HOSPITAL_BASED_OUTPATIENT_CLINIC_OR_DEPARTMENT_OTHER): Payer: BC Managed Care – PPO

## 2016-10-08 ENCOUNTER — Ambulatory Visit (HOSPITAL_COMMUNITY): Payer: BC Managed Care – PPO | Attending: Cardiovascular Disease

## 2016-10-08 DIAGNOSIS — Z950 Presence of cardiac pacemaker: Secondary | ICD-10-CM | POA: Diagnosis not present

## 2016-10-08 DIAGNOSIS — R0989 Other specified symptoms and signs involving the circulatory and respiratory systems: Secondary | ICD-10-CM

## 2016-10-08 DIAGNOSIS — I517 Cardiomegaly: Secondary | ICD-10-CM | POA: Insufficient documentation

## 2017-01-11 ENCOUNTER — Ambulatory Visit (INDEPENDENT_AMBULATORY_CARE_PROVIDER_SITE_OTHER): Payer: BC Managed Care – PPO | Admitting: Family Medicine

## 2017-01-11 ENCOUNTER — Encounter: Payer: Self-pay | Admitting: Family Medicine

## 2017-01-11 ENCOUNTER — Telehealth: Payer: Self-pay | Admitting: Family Medicine

## 2017-01-11 ENCOUNTER — Ambulatory Visit (HOSPITAL_COMMUNITY)
Admission: RE | Admit: 2017-01-11 | Discharge: 2017-01-11 | Disposition: A | Discharge status: Home / Self Care | Payer: BC Managed Care – PPO | Source: Ambulatory Visit | Attending: Family Medicine | Admitting: Family Medicine

## 2017-01-11 VITALS — BP 125/73 | HR 73 | Resp 16 | Ht 70.0 in | Wt 144.6 lb

## 2017-01-11 DIAGNOSIS — N451 Epididymitis: Secondary | ICD-10-CM | POA: Insufficient documentation

## 2017-01-11 DIAGNOSIS — N5089 Other specified disorders of the male genital organs: Secondary | ICD-10-CM | POA: Diagnosis not present

## 2017-01-11 DIAGNOSIS — N433 Hydrocele, unspecified: Secondary | ICD-10-CM | POA: Diagnosis not present

## 2017-01-11 DIAGNOSIS — R350 Frequency of micturition: Secondary | ICD-10-CM | POA: Diagnosis not present

## 2017-01-11 DIAGNOSIS — M545 Low back pain, unspecified: Secondary | ICD-10-CM

## 2017-01-11 DIAGNOSIS — R300 Dysuria: Secondary | ICD-10-CM

## 2017-01-11 DIAGNOSIS — N452 Orchitis: Secondary | ICD-10-CM | POA: Diagnosis not present

## 2017-01-11 LAB — POCT URINALYSIS DIPSTICK
Bilirubin, UA: NEGATIVE
Glucose, UA: NEGATIVE
KETONES UA: NEGATIVE
NITRITE UA: NEGATIVE
PH UA: 5.5 (ref 5.0–8.0)
PROTEIN UA: 30
Spec Grav, UA: 1.025 (ref 1.010–1.025)
UROBILINOGEN UA: 0.2 U/dL

## 2017-01-11 LAB — POCT CBC
Granulocyte percent: 77.9 %G (ref 37–80)
HEMATOCRIT: 43.9 % (ref 43.5–53.7)
HEMOGLOBIN: 15.4 g/dL (ref 14.1–18.1)
LYMPH, POC: 1.5 (ref 0.6–3.4)
MCH, POC: 30.8 pg (ref 27–31.2)
MCHC: 35.1 g/dL (ref 31.8–35.4)
MCV: 87.8 fL (ref 80–97)
MID (cbc): 1 — AB (ref 0–0.9)
MPV: 7.6 fL (ref 0–99.8)
POC GRANULOCYTE: 8.9 — AB (ref 2–6.9)
POC LYMPH PERCENT: 13.2 %L (ref 10–50)
POC MID %: 8.9 %M (ref 0–12)
Platelet Count, POC: 203 10*3/uL (ref 142–424)
RBC: 5 M/uL (ref 4.69–6.13)
RDW, POC: 12.8 %
WBC: 11.4 10*3/uL — AB (ref 4.6–10.2)

## 2017-01-11 LAB — POC MICROSCOPIC URINALYSIS (UMFC): Mucus: ABSENT

## 2017-01-11 MED ORDER — CIPROFLOXACIN HCL 500 MG PO TABS: 500.0000 mg | ORAL_TABLET | Freq: Two times a day (BID) | ORAL | 0 refills | Status: DC

## 2017-01-11 NOTE — Telephone Encounter (Signed)
See lab notes, I reviewed his ultrasound results.

## 2017-01-11 NOTE — Telephone Encounter (Signed)
DR Carlota Raspberry PT WIFE CALLED TO LET YOU KNOW THAT YOU CAN CALL BACK AROUND 2 HER HUSBAND SHOULD BE BACK HOME BY THEN FOR HIS RESULTS FROM U/S

## 2017-01-11 NOTE — Telephone Encounter (Signed)
Left message to have to pt call back for lab results.   Notes recorded by [name], MD on 01/11/2017 at 2:56 PM EDT Results discussed with patient, likely reactive hydrocele with the bilateral epididymitis. Continue Cipro, recheck in the next week to 10 days, sooner if worse.

## 2017-01-11 NOTE — Patient Instructions (Addendum)
Tylenol or ibuprofen if needed (tylenol is safer to start), range of motion, stretches as needed for back. See other info below, but recheck if not improving in next 3-4 weeks, sooner if worse.   The right testicle swelling and pain appears to be possible orchitis or infection. I will check an ultrasound today to look into other causes, but for now start Cipro 1 pill twice per day, then follow-up in the next 4-5 days, sooner if worse.   Orchitis Orchitis is swelling (inflammation) of a testicle caused by infection. Testicles are the male organs that produce sperm. The testicles are held in a fleshy sac (scrotum) located behind the penis. Orchitis usually affects only one testicle, but it can occur in both. The condition can develop suddenly. Orchitis can be caused by many different kinds of bacteria and viruses. What are the causes? Orchitis can be caused by either a bacterial or viral infection. Bacterial Infections  These often occur along with an infection of the coiled tube that collects sperm and sits on top of the testicle (epididymis).  In men who are not sexually active, bacterial orchitis usually starts as a urinary tract infection and spreads to the testicle.  In sexually active men, sexually transmitted infections are the most common cause of bacterial orchitis. These can include: ? Gonorrhea. ? Chlamydia. Viral Infections  Mumps is still the most common cause of viral orchitis, though mumps is now rare in many areas because of vaccination.  Other viruses that can cause orchitis include: ? The chickenpox virus (varicella-zoster virus). ? The virus that causes mononucleosis (Epstein-Barr virus). What increases the risk? Boys and men who have not been vaccinated against mumps are at risk for mumps orchitis. Risk factors for bacterial orchitis include:  Frequent urinary tract infections.  High-risk sexual behaviors.  Having a sexual partner with a sexually transmitted  infection.  Having had urinary tract surgery.  Using a tube passed through the penis to drain urine (Foley catheter).  An enlarged prostate gland.  What are the signs or symptoms? The most common symptoms of orchitis are swelling and pain in the scrotum. Other signs and symptoms may include:  Feeling generally sick (malaise).  Fever and chills.  Painful urination.  Painful ejaculation.  Blood or discharge from the penis.  Nausea.  Headache.  Fatigue.  How is this diagnosed? Your health care provider may suspect orchitis if you have a painful, swollen testicle along with other signs and symptoms of the condition. A physical exam will be done. Tests may also be done to help your health care provider make a diagnosis. These may include:  A blood test to check for signs of infection.  A urine test to check for a urinary tract infection.  Using a swab to collect a fluid sample from the tip of the penis to test for sexually transmitted infections.  Taking an image of the testicle using sound waves and a computer (testicular ultrasound).  How is this treated? Treatment of orchitis depends on the cause. For orchitis caused by a bacterial infection, your health care provider will most likely prescribe antibiotic medicines. Bacterial infections usually clear up within a few days. Both viral infections and bacterial infections may be treated with:  Bed rest.  Anti-inflammatory medicines.  Pain medicines.  Elevating the scrotum and applying ice.  Follow these instructions at home:  Rest as directed by your health care provider.  Take medicines only as directed by your health care provider.  If you were  prescribed an antibiotic medicine, finish it all even if you start to feel better.  Elevate your scrotum and apply ice as directed: ? Put ice in a plastic bag. ? Place a small towel or pillow between your legs. ? Rest your scrotum on the pillow or towel. ? Place  another towel between your skin and the plastic bag. ? Leave the ice on for 20 minutes, 2-3 times a day. Contact a health care provider if:  You have a fever.  Pain and swelling have not gotten better after 3 days. Get help right away if:  Your pain is getting worse.  The swelling in your testicle gets worse. This information is not intended to replace advice given to you by your health care provider. Make sure you discuss any questions you have with your health care provider. Document Released: 06/19/2000 Document Revised: 11/28/2015 Document Reviewed: 11/09/2013 Elsevier Interactive Patient Education  2018 [employer].    Back Pain, Adult Back pain is very common in adults.The cause of back pain is rarely dangerous and the pain often gets better over time.The cause of your back pain may not be known. Some common causes of back pain include:  Strain of the muscles or ligaments supporting the spine.  Wear and tear (degeneration) of the spinal disks.  Arthritis.  Direct injury to the back.  For many people, back pain may return. Since back pain is rarely dangerous, most people can learn to manage this condition on their own. Follow these instructions at home: Watch your back pain for any changes. The following actions may help to lessen any discomfort you are feeling:  Remain active. It is stressful on your back to sit or stand in one place for long periods of time. Do not sit, drive, or stand in one place for more than 30 minutes at a time. Take short walks on even surfaces as soon as you are able.Try to increase the length of time you walk each day.  Exercise regularly as directed by your health care provider. Exercise helps your back heal faster. It also helps avoid future injury by keeping your muscles strong and flexible.  Do not stay in bed.Resting more than 1-2 days can delay your recovery.  Pay attention to your body when you bend and lift. The most comfortable  positions are those that put less stress on your recovering back. Always use proper lifting techniques, including: ? Bending your knees. ? Keeping the load close to your body. ? Avoiding twisting.  Find a comfortable position to sleep. Use a firm mattress and lie on your side with your knees slightly bent. If you lie on your back, put a pillow under your knees.  Avoid feeling anxious or stressed.Stress increases muscle tension and can worsen back pain.It is important to recognize when you are anxious or stressed and learn ways to manage it, such as with exercise.  Take medicines only as directed by your health care provider. Over-the-counter medicines to reduce pain and inflammation are often the most helpful.Your health care provider may prescribe muscle relaxant drugs.These medicines help dull your pain so you can more quickly return to your normal activities and healthy exercise.  Apply ice to the injured area: ? Put ice in a plastic bag. ? Place a towel between your skin and the bag. ? Leave the ice on for 20 minutes, 2-3 times a day for the first 2-3 days. After that, ice and heat may be alternated to reduce pain and spasms.  Maintain a healthy weight. Excess weight puts extra stress on your back and makes it difficult to maintain good posture.  Contact a health care provider if:  You have pain that is not relieved with rest or medicine.  You have increasing pain going down into the legs or buttocks.  You have pain that does not improve in one week.  You have night pain.  You lose weight.  You have a fever or chills. Get help right away if:  You develop new bowel or bladder control problems.  You have unusual weakness or numbness in your arms or legs.  You develop nausea or vomiting.  You develop abdominal pain.  You feel faint. This information is not intended to replace advice given to you by your health care provider. Make sure you discuss any questions you have  with your health care provider. Document Released: 06/22/2005 Document Revised: 10/31/2015 Document Reviewed: 10/24/2013 Elsevier Interactive Patient Education  2017 [employer].   IF you received an x-ray today, you will receive an invoice from [facility]. Please contact [facility] at [phone] with questions or concerns regarding your invoice.   IF you received labwork today, you will receive an invoice from [facility]. Please contact [facility] at [phone] with questions or concerns regarding your invoice.   Our billing staff will not be able to assist you with questions regarding bills from these companies.  You will be contacted with the lab results as soon as they are available. The fastest way to get your results is to activate your My Chart account. Instructions are located on the last page of this paperwork. If you have not heard from [place] regarding the results in 2 weeks, please contact this office.

## 2017-01-11 NOTE — Progress Notes (Signed)
Subjective:  By signing my name below, I, Essence Howell, attest that this documentation has been prepared under the direction and in the presence of [name], MD Electronically Signed: Ladene Artist, ED Scribe 01/11/2017 at 9:49 AM.   Patient ID: Rodney Adkins, male    DOB: [birth year], 61 y.o.   MRN: [id number]  Chief Complaint  Patient presents with  . Groin Swelling    R testicle, appx 2 days  . Back Pain    states he drove on a long trip, began afterwards   HPI Rodney Adkins is a 61 y.o. male who presents to Primary Care at [facility] complaining of right testicular pain first noticed 2 days ago. Pt states that he initially noticed tenderness 2 days ago but pain worsened yesterday, has slightly improved today. He has also noticed swelling to the right testicle and head of his penis last night. Pt reports associated symptoms of urinary frequency and slight dysuria. He denies fever, difficulty urinating, hematuria, penile discharge, new sexual contacts, h/o similar symptoms.   Low Back Pain Pt states that he drove to [place] ~2 weeks ago and has had low back pain on the drive back home since. He reports increased back pain with bending but reports similar pain in the past. Pt has tried 2 ibuprofen tablets last night, sleeping on the floor and Yoga. Pt denies bladder/bowel incontinence, saddle anesthesia, weakness in LE, radiation of back pain  Patient Active Problem List   Diagnosis Date Noted  . BPH (benign prostatic hyperplasia) 02/02/2016  . Fractured atrial pacemaker lead wire 07/18/2015  . 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 Chest pain 08/02/2013  . SOB (shortness of breath) 08/02/2013  . PVC (premature ventricular contraction) 02/13/2013  . Chest pain on exertion 02/25/2012  . Pacemaker-dual-chamber Medtronic 04/29/2011  . Change in bowel habits 01/22/2011  . Weight loss 01/22/2011  . COUGH 09/01/2010  . Multiple sclerosis ([place])   . HOCM (hypertrophic  obstructive cardiomyopathy) ([place])   . Right bundle branch block   . Complete heart block ([place]) 12/05/2007   Past Medical History:  Diagnosis Date  . Complete heart block [facility]) June 2009   a. Presented with hypotension/pulm edema 12/2007 when HOCM was also discovered. s/p dual chamber MDT pacemaker. Thallium stest 7/09 negative for infiltrative disease or ischemia.  Followed Dr [name]  Marland Kitchen Heart murmur   . HOCM (hypertrophic obstructive cardiomyopathy) ([place]) 2009   Discovered by ECHO when he developed CHB. Mgmt Dr Caryl Comes  and Dr [name] at [facility]. Has outflow obstruction on ECHO 2011  . Multiple sclerosis ([place]) 1998   Sxs started 1998 with L body numbness. MRI c/w MS. Started Avonex. Saw Dr [name] at [facility] and switched to Betaseron and was on for 5 yrs but developed depression and site rxns and D/C'd 2006. While on Betaseron, occassionally required solumedrol for flares.  . Prostate enlargement   . Right bundle branch block   . SOB (shortness of breath)    a. PFTs 09/2010 essentially normal per pulm note (mild obst defect). b. 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  . 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 Pacer reprogrammed with improvement in sx.   Past Surgical History:  Procedure Laterality Date  . LEFT HEART CATHETERIZATION WITH CORONARY ANGIOGRAM N/A 08/22/2013   Procedure: LEFT HEART CATHETERIZATION WITH CORONARY ANGIOGRAM;  Surgeon: [name], MD;  Location: [facility] CATH LAB;  Service: Cardiovascular;  Laterality: N/A;  . LEG TENDON SURGERY  2008   Os peroneus resection and peroneal tendon repair  . pace Agricultural engineer    . PACEMAKER INSERTION  12/2007   Complete heart block  . PACEMAKER LEAD REMOVAL Left 07/18/2015   Procedure: ATRIAL PACEMAKER LEAD EXTRACTION; ATRIAL PACEMAKER LEAD INSERTION; GENERATOR REPLACEMENT.;  Surgeon: [name], MD;  Location: [place];  Service: Cardiovascular;  Laterality: Left;  [name] to back up  . PERIPHERAL VASCULAR CATHETERIZATION N/A 02/12/2015   Procedure: Venogram;  Surgeon: [name], MD;  Location: [place] CV LAB;  Service: Cardiovascular;  Laterality: N/A;  . TEE WITHOUT CARDIOVERSION N/A 07/18/2015   Procedure: TRANSESOPHAGEAL ECHOCARDIOGRAM (TEE);  Surgeon: [name], MD;  Location: [facility] OR;  Service: Cardiovascular;  Laterality: N/A;   No Known Allergies Prior to Admission medications   Medication Sig Start Date End Date Taking? Authorizing Provider  Ascorbic Acid (VITAMIN C) 500 MG tablet Take 500 mg by mouth daily as needed (Vitamin c supplement).    Yes [provider]  cholecalciferol (VITAMIN D) 1000 UNITS tablet Take 1,000 Units by mouth daily. Take 5,000 units daily   Yes [provider]  Multiple Vitamin (MULTIVITAMIN) tablet Take 1 tablet by mouth daily.    Yes [provider]  Dimethyl Fumarate (TECFIDERA) 240 MG CPDR Take 240 mg by mouth 2 (two) times daily.    [provider]  finasteride (PROSCAR) 5 MG tablet Take 1 tablet by mouth daily as needed. 04/18/16   [provider]   Social History   Social History  . Marital status: Married    Spouse name: N/A  . Number of children: 0  . Years of education: N/A   Occupational History  . professor [employer]   Social History Main Topics  . Smoking status: Never Smoker  . Smokeless tobacco: Never Used  . Alcohol use No  . Drug use: No  . Sexual activity: Not on file   Other Topics Concern  . 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   Review of Systems  Constitutional: Negative for fever.  Genitourinary: Positive for dysuria (mild), frequency, penile pain, penile swelling and testicular pain. Negative for difficulty urinating, discharge and hematuria.  Musculoskeletal: Positive for back pain.    Neurological: Negative for weakness.      Objective:   Physical Exam  Constitutional: He is oriented to person, place, and time. He appears well-developed and well-nourished. No distress.  HENT:  Head: Normocephalic and atraumatic.  Eyes: Conjunctivae and EOM are normal.  Neck: Neck supple. No tracheal deviation present.  Cardiovascular: Normal rate.   Pulmonary/Chest: Effort normal. No respiratory distress.  Abdominal: Soft. He exhibits no distension. There is no tenderness. Hernia confirmed negative in the right inguinal area and confirmed negative in the left inguinal area.  Genitourinary:    Right testis shows swelling and tenderness. No penile erythema. No discharge found.  Musculoskeletal: Normal range of motion.  Pt points to paraspinal muscles of the lumbar back. Flexion to 80 degrees. Heel and toe walk without difficulty. Lateral ROM was equal.   Lymphadenopathy:       Right: No inguinal adenopathy present.       Left: No inguinal adenopathy present.  Neurological: He is alert and oriented to person, place, and time.  Skin: Skin is warm  and dry. No rash noted.  Psychiatric: He has a normal mood and affect. His behavior is normal.  Nursing note and vitals reviewed.  Vitals:   01/11/17 0913  BP: 125/73  Pulse: 73  Resp: 16  SpO2: 99%  Weight: 144 lb 9.6 oz (65.6 kg)  Height: 5\' 10"  (1.778 m)    US Scrotum  Result Date: 01/11/2017 CLINICAL DATA:  Right testicular swelling for 2 days EXAM: SCROTAL ULTRASOUND DOPPLER ULTRASOUND OF THE TESTICLES TECHNIQUE: Complete ultrasound examination of the testicles, epididymis, and other scrotal structures was performed. Color and spectral Doppler ultrasound were also utilized to evaluate blood flow to the testicles. COMPARISON:  None. FINDINGS: Right testicle Measurements: 3.8 x 2.4 x 3 cm. No mass or microlithiasis visualized. Left testicle Measurements: 4.1 x 2.1 x 2.8 cm. No mass or microlithiasis visualized. Right epididymis:  Heterogeneous echotexture with increased Doppler. 2 mm epididymal head cyst. Left epididymis:  Normal echogenicity.  Increased Doppler flow. Hydrocele:  Bilateral hydroceles, right greater than left. Varicocele:  None visualized. Pulsed Doppler interrogation of both testes demonstrates normal low resistance arterial and venous waveforms bilaterally. IMPRESSION: 1. Bilateral epididymitis. 2. Bilateral hydroceles, right greater than left. Electronically Signed   By: Kathreen Devoid   On: 01/11/2017 13:05   [place] Art/ven Flow Abd Pelv Doppler  Result Date: 01/11/2017 CLINICAL DATA:  Right testicular swelling for 2 days EXAM: SCROTAL ULTRASOUND DOPPLER ULTRASOUND OF THE TESTICLES TECHNIQUE: Complete ultrasound examination of the testicles, epididymis, and other scrotal structures was performed. Color and spectral Doppler ultrasound were also utilized to evaluate blood flow to the testicles. COMPARISON:  None. FINDINGS: Right testicle Measurements: 3.8 x 2.4 x 3 cm. No mass or microlithiasis visualized. Left testicle Measurements: 4.1 x 2.1 x 2.8 cm. No mass or microlithiasis visualized. Right epididymis: Heterogeneous echotexture with increased Doppler. 2 mm epididymal head cyst. Left epididymis:  Normal echogenicity.  Increased Doppler flow. Hydrocele:  Bilateral hydroceles, right greater than left. Varicocele:  None visualized. Pulsed Doppler interrogation of both testes demonstrates normal low resistance arterial and venous waveforms bilaterally. IMPRESSION: 1. Bilateral epididymitis. 2. Bilateral hydroceles, right greater than left. Electronically Signed   By: Kathreen Devoid   On: 01/11/2017 13:05       Assessment & Plan:    NECHEMIA CHIAPPETTA is a 61 y.o. male Swollen testicle - Plan: POCT urinalysis dipstick, Urine Culture, POCT Microscopic Urinalysis ([facility]), POCT CBC, [place] Art/Ven Flow Abd Pelv Doppler, US Scrotum Dysuria - Plan: POCT Microscopic Urinalysis ([facility]) Urinary frequency - Plan: POCT Microscopic  Urinalysis ([facility]) Orchitis - Plan: POCT CBC  - Bilateral epididymitis with likely reactive hydroceles. Start on Cipro, discussed scrotal elevation, relative rest, recheck in the next week to 10 days. Sooner if worse.  Bilateral low back pain without sciatica, unspecified chronicity  -Suspected low back strain versus flare of DDD. No red flags on exam or history, continue symptomatic care, RTC precautions if persistent or worsening.   Meds ordered this encounter  Medications  . ciprofloxacin (CIPRO) 500 MG tablet    Sig: Take 1 tablet (500 mg total) by mouth 2 (two) times daily.    Dispense:  20 tablet    Refill:  0   Patient Instructions   Tylenol or ibuprofen if needed (tylenol is safer to start), range of motion, stretches as needed for back. See other info below, but recheck if not improving in next 3-4 weeks, sooner if worse.   The right testicle swelling and pain appears to be possible orchitis  or infection. I will check an ultrasound today to look into other causes, but for now start Cipro 1 pill twice per day, then follow-up in the next 4-5 days, sooner if worse.   Orchitis Orchitis is swelling (inflammation) of a testicle caused by infection. Testicles are the male organs that produce sperm. The testicles are held in a fleshy sac (scrotum) located behind the penis. Orchitis usually affects only one testicle, but it can occur in both. The condition can develop suddenly. Orchitis can be caused by many different kinds of bacteria and viruses. What are the causes? Orchitis can be caused by either a bacterial or viral infection. Bacterial Infections  These often occur along with an infection of the coiled tube that collects sperm and sits on top of the testicle (epididymis).  In men who are not sexually active, bacterial orchitis usually starts as a urinary tract infection and spreads to the testicle.  In sexually active men, sexually transmitted infections are the most common  cause of bacterial orchitis. These can include: ? Gonorrhea. ? Chlamydia. Viral Infections  Mumps is still the most common cause of viral orchitis, though mumps is now rare in many areas because of vaccination.  Other viruses that can cause orchitis include: ? The chickenpox virus (varicella-zoster virus). ? The virus that causes mononucleosis (Epstein-Barr virus). What increases the risk? Boys and men who have not been vaccinated against mumps are at risk for mumps orchitis. Risk factors for bacterial orchitis include:  Frequent urinary tract infections.  High-risk sexual behaviors.  Having a sexual partner with a sexually transmitted infection.  Having had urinary tract surgery.  Using a tube passed through the penis to drain urine (Foley catheter).  An enlarged prostate gland.  What are the signs or symptoms? The most common symptoms of orchitis are swelling and pain in the scrotum. Other signs and symptoms may include:  Feeling generally sick (malaise).  Fever and chills.  Painful urination.  Painful ejaculation.  Blood or discharge from the penis.  Nausea.  Headache.  Fatigue.  How is this diagnosed? Your health care provider may suspect orchitis if you have a painful, swollen testicle along with other signs and symptoms of the condition. A physical exam will be done. Tests may also be done to help your health care provider make a diagnosis. These may include:  A blood test to check for signs of infection.  A urine test to check for a urinary tract infection.  Using a swab to collect a fluid sample from the tip of the penis to test for sexually transmitted infections.  Taking an image of the testicle using sound waves and a computer (testicular ultrasound).  How is this treated? Treatment of orchitis depends on the cause. For orchitis caused by a bacterial infection, your health care provider will most likely prescribe antibiotic medicines. Bacterial  infections usually clear up within a few days. Both viral infections and bacterial infections may be treated with:  Bed rest.  Anti-inflammatory medicines.  Pain medicines.  Elevating the scrotum and applying ice.  Follow these instructions at home:  Rest as directed by your health care provider.  Take medicines only as directed by your health care provider.  If you were prescribed an antibiotic medicine, finish it all even if you start to feel better.  Elevate your scrotum and apply ice as directed: ? Put ice in a plastic bag. ? Place a small towel or pillow between your legs. ? Rest your scrotum on the  pillow or towel. ? Place another towel between your skin and the plastic bag. ? Leave the ice on for 20 minutes, 2-3 times a day. Contact a health care provider if:  You have a fever.  Pain and swelling have not gotten better after 3 days. Get help right away if:  Your pain is getting worse.  The swelling in your testicle gets worse. This information is not intended to replace advice given to you by your health care provider. Make sure you discuss any questions you have with your health care provider. Document Released: 06/19/2000 Document Revised: 11/28/2015 Document Reviewed: 11/09/2013 Elsevier Interactive Patient Education  2018 [employer].    Back Pain, Adult Back pain is very common in adults.The cause of back pain is rarely dangerous and the pain often gets better over time.The cause of your back pain may not be known. Some common causes of back pain include:  Strain of the muscles or ligaments supporting the spine.  Wear and tear (degeneration) of the spinal disks.  Arthritis.  Direct injury to the back.  For many people, back pain may return. Since back pain is rarely dangerous, most people can learn to manage this condition on their own. Follow these instructions at home: Watch your back pain for any changes. The following actions may help to  lessen any discomfort you are feeling:  Remain active. It is stressful on your back to sit or stand in one place for long periods of time. Do not sit, drive, or stand in one place for more than 30 minutes at a time. Take short walks on even surfaces as soon as you are able.Try to increase the length of time you walk each day.  Exercise regularly as directed by your health care provider. Exercise helps your back heal faster. It also helps avoid future injury by keeping your muscles strong and flexible.  Do not stay in bed.Resting more than 1-2 days can delay your recovery.  Pay attention to your body when you bend and lift. The most comfortable positions are those that put less stress on your recovering back. Always use proper lifting techniques, including: ? Bending your knees. ? Keeping the load close to your body. ? Avoiding twisting.  Find a comfortable position to sleep. Use a firm mattress and lie on your side with your knees slightly bent. If you lie on your back, put a pillow under your knees.  Avoid feeling anxious or stressed.Stress increases muscle tension and can worsen back pain.It is important to recognize when you are anxious or stressed and learn ways to manage it, such as with exercise.  Take medicines only as directed by your health care provider. Over-the-counter medicines to reduce pain and inflammation are often the most helpful.Your health care provider may prescribe muscle relaxant drugs.These medicines help dull your pain so you can more quickly return to your normal activities and healthy exercise.  Apply ice to the injured area: ? Put ice in a plastic bag. ? Place a towel between your skin and the bag. ? Leave the ice on for 20 minutes, 2-3 times a day for the first 2-3 days. After that, ice and heat may be alternated to reduce pain and spasms.  Maintain a healthy weight. Excess weight puts extra stress on your back and makes it difficult to maintain good  posture.  Contact a health care provider if:  You have pain that is not relieved with rest or medicine.  You have increasing pain going  down into the legs or buttocks.  You have pain that does not improve in one week.  You have night pain.  You lose weight.  You have a fever or chills. Get help right away if:  You develop new bowel or bladder control problems.  You have unusual weakness or numbness in your arms or legs.  You develop nausea or vomiting.  You develop abdominal pain.  You feel faint. This information is not intended to replace advice given to you by your health care provider. Make sure you discuss any questions you have with your health care provider. Document Released: 06/22/2005 Document Revised: 10/31/2015 Document Reviewed: 10/24/2013 Elsevier Interactive Patient Education  2017 [employer].   IF you received an x-ray today, you will receive an invoice from [facility]. Please contact [facility] at [phone] with questions or concerns regarding your invoice.   IF you received labwork today, you will receive an invoice from [place]. Please contact [facility] at [phone] with questions or concerns regarding your invoice.   Our billing staff will not be able to assist you with questions regarding bills from these companies.  You will be contacted with the lab results as soon as they are available. The fastest way to get your results is to activate your My Chart account. Instructions are located on the last page of this paperwork. If you have not heard from [place] regarding the results in 2 weeks, please contact this office.      I personally performed the services described in this documentation, which was scribed in my presence. The recorded information has been reviewed and considered for accuracy and completeness, addended by me as needed, and agree with information above.  Signed,   [name], MD Primary Care at  [place].  01/11/17 2:59 PM

## 2017-01-11 NOTE — Telephone Encounter (Signed)
Pt called back to day for his ultrasound results   Best number [phone]

## 2017-01-12 LAB — URINE CULTURE: ORGANISM ID, BACTERIA: NO GROWTH

## 2017-01-14 ENCOUNTER — Ambulatory Visit (INDEPENDENT_AMBULATORY_CARE_PROVIDER_SITE_OTHER): Payer: BC Managed Care – PPO | Admitting: Family Medicine

## 2017-01-14 ENCOUNTER — Encounter: Payer: Self-pay | Admitting: Family Medicine

## 2017-01-14 VITALS — BP 117/77 | HR 72 | Temp 97.7°F | Resp 16 | Ht 70.0 in | Wt 142.8 lb

## 2017-01-14 DIAGNOSIS — R3911 Hesitancy of micturition: Secondary | ICD-10-CM | POA: Diagnosis not present

## 2017-01-14 DIAGNOSIS — N451 Epididymitis: Secondary | ICD-10-CM | POA: Diagnosis not present

## 2017-01-14 DIAGNOSIS — N5089 Other specified disorders of the male genital organs: Secondary | ICD-10-CM

## 2017-01-14 DIAGNOSIS — R300 Dysuria: Secondary | ICD-10-CM | POA: Diagnosis not present

## 2017-01-14 LAB — POCT CBC
GRANULOCYTE PERCENT: 82 % — AB (ref 37–80)
HCT, POC: 41.2 % — AB (ref 43.5–53.7)
Hemoglobin: 15 g/dL (ref 14.1–18.1)
Lymph, poc: 1.2 (ref 0.6–3.4)
MCH: 32.3 pg — AB (ref 27–31.2)
MCHC: 36.5 g/dL — AB (ref 31.8–35.4)
MCV: 88.6 fL (ref 80–97)
MID (CBC): 0.4 (ref 0–0.9)
MPV: 8.1 fL (ref 0–99.8)
PLATELET COUNT, POC: 208 10*3/uL (ref 142–424)
POC GRANULOCYTE: 7.1 — AB (ref 2–6.9)
POC LYMPH %: 13.7 % (ref 10–50)
POC MID %: 4.3 %M (ref 0–12)
RBC: 4.65 M/uL — AB (ref 4.69–6.13)
RDW, POC: 12.9 %
WBC: 8.7 10*3/uL (ref 4.6–10.2)

## 2017-01-14 LAB — POCT URINALYSIS DIP (MANUAL ENTRY)
GLUCOSE UA: NEGATIVE mg/dL
Nitrite, UA: NEGATIVE
Protein Ur, POC: 30 mg/dL — AB
Spec Grav, UA: >=1.03 — AB (ref 1.010–1.025)
UROBILINOGEN UA: 0.2 U/dL
pH, UA: 5.5 (ref 5.0–8.0)

## 2017-01-14 NOTE — Progress Notes (Signed)
Subjective:  This chart was scribed for Rodney Agreste, MD by Tamsen Roers, at [place] at [facility].  This patient was seen in room 26 and the patient's care was started at 8:41 AM.   Chief Complaint  Patient presents with  . Follow-up    swollen testicle, hasn't gotten any better      Patient ID: Rodney Adkins, male    DOB: Sep 27, 1955, 61 y.o.   MRN: [id number]  HPI HPI Comments: Rodney Adkins is a 61 y.o. male who presents to Primary Care at [facility] for a follow up of testicle swelling.  He was seen three days ago.  Noticed right testicular pain two days prior.  Ultrasound was obtained indicating bilateral hydroceles as well as bilateral epididymidis. He was placed on Ciprofloxacin 500 mg BID, symptomatic care was discussed and he is here for a follow up. We have used Levaquin in the past but he feels like he had a tendinopathy on that medication.  He is doing okay on the ciprofloxacin. --- Patient states that his testicle is now larger but his pain has not changed much since his last visit.  He has some difficulty urinating as it is hard to actually start urination- has noticed this more since his last visit. .  Denies any fevers/chills. He has taken 6 total doses of Ciprofloxacin and denies any side effects with this. Patient had a prostatectomy at [facility] on December 19 th 2017. He does not believe that he has had his PSA checked since this time.   Patient is also complaining of lower back pain which is about the same as last visit, see details as he had some soreness after traveling. Thought to have a flare of DDD or strain. symptomatic care was discussed.   He has been taking either aspirin, tylenol or ibuprofen a few times per day and averaging NSAIDs about twice per day.    Patient Active Problem List   Diagnosis Date Noted  . BPH (benign prostatic hyperplasia) 02/02/2016  . Fractured atrial pacemaker lead wire 07/18/2015  . 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 Chest pain 08/02/2013  . SOB (shortness of breath) 08/02/2013  . PVC (premature ventricular contraction) 02/13/2013  . Chest pain on exertion 02/25/2012  . Pacemaker-dual-chamber Medtronic 04/29/2011  . Change in bowel habits 01/22/2011  . Weight loss 01/22/2011  . COUGH 09/01/2010  . Multiple sclerosis ([place])   . HOCM (hypertrophic obstructive cardiomyopathy) ([place])   . Right bundle branch block   . Complete heart block ([place]) 12/05/2007   Past Medical History:  Diagnosis Date  . Complete heart block [facility]) June 2009   a. Presented with hypotension/pulm edema 12/2007 when HOCM was also discovered. s/p dual chamber MDT pacemaker. Thallium stest 7/09 negative for infiltrative disease or ischemia.  Followed Dr [name]  Marland Kitchen Heart murmur   . HOCM (hypertrophic obstructive cardiomyopathy) ([place]) 2009   Discovered by ECHO when he developed CHB. Mgmt Dr Caryl Comes  and Dr [name] at [facility]. Has outflow obstruction on ECHO 2011  . Multiple sclerosis ([place]) 1998   Sxs started 1998 with L body numbness. MRI c/w MS. Started Avonex. Saw Dr [name] at [facility] and switched to Betaseron and was on for 5 yrs but developed depression and site rxns and D/C'd 2006. While on Betaseron, occassionally required solumedrol for flares.  . Prostate enlargement   . Right bundle branch block   . SOB (shortness of breath)    a. PFTs 09/2010 essentially  normal per pulm note (mild obst defect). b. 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  . 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 Pacer reprogrammed with improvement in sx.   Past Surgical History:  Procedure Laterality Date  . LEFT HEART CATHETERIZATION WITH CORONARY ANGIOGRAM N/A 08/22/2013   Procedure: LEFT HEART CATHETERIZATION WITH CORONARY ANGIOGRAM;  Surgeon: [name], MD;  Location: [facility] CATH LAB;  Service:  Cardiovascular;  Laterality: N/A;  . LEG TENDON SURGERY  2008   Os peroneus resection and peroneal tendon repair  . pace Agricultural engineer    . PACEMAKER INSERTION  12/2007   Complete heart block  . PACEMAKER LEAD REMOVAL Left 07/18/2015   Procedure: ATRIAL PACEMAKER LEAD EXTRACTION; ATRIAL PACEMAKER LEAD INSERTION; GENERATOR REPLACEMENT.;  Surgeon: [name], MD;  Location: [facility];  Service: Cardiovascular;  Laterality: Left;  [name] to back up  . PERIPHERAL VASCULAR CATHETERIZATION N/A 02/12/2015   Procedure: Venogram;  Surgeon: [name], MD;  Location: [place] [facility] [place] CV LAB;  Service: Cardiovascular;  Laterality: N/A;  . TEE WITHOUT CARDIOVERSION N/A 07/18/2015   Procedure: TRANSESOPHAGEAL ECHOCARDIOGRAM (TEE);  Surgeon: [name], MD;  Location: [facility] OR;  Service: Cardiovascular;  Laterality: N/A;   No Known Allergies Prior to Admission medications   Medication Sig Start Date End Date Taking? Authorizing Provider  Ascorbic Acid (VITAMIN C) 500 MG tablet Take 500 mg by mouth daily as needed (Vitamin c supplement).    Yes [provider]  cholecalciferol (VITAMIN D) 1000 UNITS tablet Take 1,000 Units by mouth daily. Take 5,000 units daily   Yes [provider]  ciprofloxacin (CIPRO) 500 MG tablet Take 1 tablet (500 mg total) by mouth 2 (two) times daily. 01/11/17  Yes Rodney Agreste, MD  Dimethyl Fumarate (TECFIDERA) 240 MG CPDR Take 240 mg by mouth 2 (two) times daily.   Yes [provider]  Multiple Vitamin (MULTIVITAMIN) tablet Take 1 tablet by mouth daily.    Yes [provider]  finasteride (PROSCAR) 5 MG tablet Take 1 tablet by mouth daily as needed. 04/18/16   [provider]   Social History   Social History  . Marital status: Married    Spouse name: N/A  . Number of children: 0  . Years of education: N/A   Occupational History  . professor [employer]   Social History Main Topics  . Smoking status: Never Smoker  .  Smokeless tobacco: Never Used  . Alcohol use No  . Drug use: No  . Sexual activity: Not on file   Other Topics Concern  . 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      Review of Systems  Constitutional: Negative for chills and fever.  HENT: Negative for drooling and facial swelling.   Eyes: Negative for pain and redness.  Respiratory: Negative for choking.   Gastrointestinal: Negative for nausea and vomiting.  Genitourinary: Positive for difficulty urinating and testicular pain.  Musculoskeletal: Positive for back pain.  Neurological: Negative for syncope and speech difficulty.       Objective:   Physical Exam  Constitutional: No distress.  HENT:  Head: Normocephalic and atraumatic.  Cardiovascular: Normal rate, regular rhythm and normal heart sounds.  Exam reveals no gallop and no friction rub.  No murmur heard. Pulmonary/Chest: Effort normal and breath sounds normal. No respiratory distress. He has no wheezes. He has no rales.  Abdominal: Soft. He exhibits no distension. There is no tenderness. There is no CVA tenderness.  Genitourinary:  Genitourinary Comments: Right testicle: Markedly swollen, tender, approximately 8 by 5 cm based on external exam.  No inguinal LAD, no penile discharge or rash. Minimal left testicle tenderness but not swollen to the degree of right. No external rash.   Neurological: He is alert.    Vitals:   01/14/17 0829  BP: 117/77  Pulse: 72  Resp: 16  Temp: 97.7 F (36.5 C)  TempSrc: Oral  SpO2: 99%  Weight: 142 lb 12.8 oz (64.8 kg)  Height: 5\' 10"  (1.778 m)   Results for orders placed or performed in visit on 01/14/17  POCT CBC  Result Value Ref Range   WBC 8.7 4.6 - 10.2 K/uL   Lymph, poc 1.2 0.6 - 3.4   POC LYMPH PERCENT 13.7 10 - 50 %L   MID (cbc) 0.4 0 - 0.9   POC MID % 4.3 0 - 12 %M   POC Granulocyte 7.1 (A) 2 - 6.9   Granulocyte percent 82.0 (A) 37 - 80 %G   RBC 4.65 (A) 4.69 -  6.13 M/uL   Hemoglobin 15.0 14.1 - 18.1 g/dL   HCT, POC 41.2 (A) 43.5 - 53.7 %   MCV 88.6 80 - 97 fL   MCH, POC 32.3 (A) 27 - 31.2 pg   MCHC 36.5 (A) 31.8 - 35.4 g/dL   RDW, POC 12.9 %   Platelet Count, POC 208 142 - 424 K/uL   MPV 8.1 0 - 99.8 fL  POCT urinalysis dipstick  Result Value Ref Range   Color, UA yellow yellow   Clarity, UA clear clear   Glucose, UA negative negative mg/dL   Bilirubin, UA small (A) negative   Ketones, POC UA trace (5) (A) negative mg/dL   Spec Grav, UA >=1.030 (A) 1.010 - 1.025   Blood, UA trace-intact (A) negative   pH, UA 5.5 5.0 - 8.0   Protein Ur, POC =30 (A) negative mg/dL   Urobilinogen, UA 0.2 0.2 or 1.0 E.U./dL   Nitrite, UA Negative Negative   Leukocytes, UA Trace (A) Negative       Assessment & Plan:   CRISTIN SZATKOWSKI is a 61 y.o. male Epididymitis - Plan: POCT CBC Swollen testicle Dysuria - Plan: POCT urinalysis dipstick, Urine Microscopic, PSA Urinary hesitancy - Plan: POCT urinalysis dipstick, Urine Microscopic, PSA  - Epididymitis based on last ultrasound without signs of torsion. CBC has improved, but persistent swelling on right with possible increase in swelling. There was also a hydrocele - likely reactive. Now with slight dysuria, urinary hesitancy, but no signs/symptoms of urinary retention at this point. Those symptoms may also be related to epididymitis. He is afebrile, CBC has improved.  - Will check PSA with hesitancy, dysuria,, but he is status post prostatectomy, so unlikely any residual tissue. Prior urine culture was negative.   -Phone call placed to urology, waiting for call back regarding any additional guidance, but for now we'll continue Cipro 500 mg twice a day. Option of changing to Levaquin was discussed, but he reports tendinopathy with Levaquin in the past. Potential risk of tendinopathy with any quinolone antibiotic was discussed.  -Continue symptomatic care with NSAIDs for both epididymitis as well as low back  strain. Possible recheck in 48 hours if not improving.  No  orders of the defined types were placed in this encounter.  Patient Instructions    Blood counts improved. For now continue Cipro twice per day, ibuprofen or Aleve low-dose few times per day with food. See other information below on treatment of epididymitis. I will check a prostate test based on your urinary symptoms, but that should be 0 with your previous prostatectomy.  I am waiting on a call back from your urologist to determine if other changes are needed. Recheck Saturday morning with me, sooner if worse.  For low back pain, Tylenol or NSAID as discussed above.  See information on low back pain. Can discuss this further Saturday if it is not improving.  Return to the clinic or go to the nearest emergency room if any of your symptoms worsen or new symptoms occur.   Epididymitis Epididymitis is swelling (inflammation) of the epididymis. The epididymis is a cord-like structure that is located along the top and back part of the testicle. It collects and stores sperm from the testicle. This condition can also cause pain and swelling of the testicle and scrotum. Symptoms usually start suddenly (acute epididymitis). Sometimes epididymitis starts gradually and lasts for a while (chronic epididymitis). This type may be harder to treat. What are the causes? In men 29 and younger, this condition is usually caused by a bacterial infection or sexually transmitted disease (STD), such as:  Gonorrhea.  Chlamydia.  In men 30 and older who do not have anal sex, this condition is usually caused by bacteria from a blockage or abnormalities in the urinary system. These can result from:  Having a tube placed into the bladder (urinary catheter).  Having an enlarged or inflamed prostate gland.  Having recent urinary tract surgery.  In men who have a condition that weakens the body's defense system (immune system), such as HIV, this condition  can be caused by:  Other bacteria, including tuberculosis and syphilis.  Viruses.  Fungi.  Sometimes this condition occurs without infection. That may happen if urine flows backward into the epididymis after heavy lifting or straining. What increases the risk? This condition is more likely to develop in men:  Who have unprotected sex with more than one partner.  Who have anal sex.  Who have recently had surgery.  Who have a urinary catheter.  Who have urinary problems.  Who have a suppressed immune system.  What are the signs or symptoms? This condition usually begins suddenly with chills, fever, and pain behind the scrotum and in the testicle. Other symptoms include:  Swelling of the scrotum, testicle, or both.  Pain whenejaculatingor urinating.  Pain in the back or belly.  Nausea.  Itching and discharge from the penis.  Frequent need to pass urine.  Redness and tenderness of the scrotum.  How is this diagnosed? Your health care provider can diagnose this condition based on your symptoms and medical history. Your health care provider will also do a physical exam to ask about your symptoms and check your scrotum and testicle for swelling, pain, and redness. You may also have other tests, including:  Examination of discharge from the penis.  Urine tests for infections, such as STDs.  Your health care provider may test you for other STDs, including HIV. How is this treated? Treatment for this condition depends on the cause. If your condition is caused by a bacterial infection, oral antibiotic medicine may be prescribed. If the bacterial infection has spread to your blood, you may need to receive IV antibiotics.  Nonbacterial epididymitis is treated with home care that includes bed rest and elevation of the scrotum. Surgery may be needed to treat:  Bacterial epididymitis that causes pus to build up in the scrotum (abscess).  Chronic epididymitis that has not  responded to other treatments.  Follow these instructions at home: Medicines  Take over-the-counter and prescription medicines only as told by your health care provider.  If you were prescribed an antibiotic medicine, take it as told by your health care provider. Do not stop taking the antibiotic even if your condition improves. Sexual Activity  If your epididymitis was caused by an STD, avoid sexual activity until your treatment is complete.  Inform your sexual partner or partners if you test positive for an STD. They may need to be treated.Do not engage in sexual activity with your partner or partners until their treatment is completed. General instructions  Return to your normal activities as told by your health care provider. Ask your health care provider what activities are safe for you.  Keep your scrotum elevated and supported while resting. Ask your health care provider if you should wear a scrotal support, such as a jockstrap. Wear it as told by your health care provider.  If directed, apply ice to the affected area: ? Put ice in a plastic bag. ? Place a towel between your skin and the bag. ? Leave the ice on for 20 minutes, 2-3 times per day.  Try taking a sitz bath to help with discomfort. This is a warm water bath that is taken while you are sitting down. The water should only come up to your hips and should cover your buttocks. Do this 3-4 times per day or as told by your health care provider.  Keep all follow-up visits as told by your health care provider. This is important. Contact a health care provider if:  You have a fever.  Your pain medicine is not helping.  Your pain is getting worse.  Your symptoms do not improve within three days. This information is not intended to replace advice given to you by your health care provider. Make sure you discuss any questions you have with your health care provider. Document Released: 06/19/2000 Document Revised: 11/28/2015  Document Reviewed: 11/07/2014 Elsevier Interactive Patient Education  2018 [place].   Back Pain, Adult Back pain is very common in adults.The cause of back pain is rarely dangerous and the pain often gets better over time.The cause of your back pain may not be known. Some common causes of back pain include:  Strain of the muscles or ligaments supporting the spine.  Wear and tear (degeneration) of the spinal disks.  Arthritis.  Direct injury to the back.  For many people, back pain may return. Since back pain is rarely dangerous, most people can learn to manage this condition on their own. Follow these instructions at home: Watch your back pain for any changes. The following actions may help to lessen any discomfort you are feeling:  Remain active. It is stressful on your back to sit or stand in one place for long periods of time. Do not sit, drive, or stand in one place for more than 30 minutes at a time. Take short walks on even surfaces as soon as you are able.Try to increase the length of time you walk each day.  Exercise regularly as directed by your health care provider. Exercise helps your back heal faster. It also helps avoid future injury by keeping your muscles strong  and flexible.  Do not stay in bed.Resting more than 1-2 days can delay your recovery.  Pay attention to your body when you bend and lift. The most comfortable positions are those that put less stress on your recovering back. Always use proper lifting techniques, including: ? Bending your knees. ? Keeping the load close to your body. ? Avoiding twisting.  Find a comfortable position to sleep. Use a firm mattress and lie on your side with your knees slightly bent. If you lie on your back, put a pillow under your knees.  Avoid feeling anxious or stressed.Stress increases muscle tension and can worsen back pain.It is important to recognize when you are anxious or stressed and learn ways to manage it,  such as with exercise.  Take medicines only as directed by your health care provider. Over-the-counter medicines to reduce pain and inflammation are often the most helpful.Your health care provider may prescribe muscle relaxant drugs.These medicines help dull your pain so you can more quickly return to your normal activities and healthy exercise.  Apply ice to the injured area: ? Put ice in a plastic bag. ? Place a towel between your skin and the bag. ? Leave the ice on for 20 minutes, 2-3 times a day for the first 2-3 days. After that, ice and heat may be alternated to reduce pain and spasms.  Maintain a healthy weight. Excess weight puts extra stress on your back and makes it difficult to maintain good posture.  Contact a health care provider if:  You have pain that is not relieved with rest or medicine.  You have increasing pain going down into the legs or buttocks.  You have pain that does not improve in one week.  You have night pain.  You lose weight.  You have a fever or chills. Get help right away if:  You develop new bowel or bladder control problems.  You have unusual weakness or numbness in your arms or legs.  You develop nausea or vomiting.  You develop abdominal pain.  You feel faint. This information is not intended to replace advice given to you by your health care provider. Make sure you discuss any questions you have with your health care provider. Document Released: 06/22/2005 Document Revised: 10/31/2015 Document Reviewed: 10/24/2013 Elsevier Interactive Patient Education  2017 [employer].   IF you received an x-ray today, you will receive an invoice from [facility]. Please contact [facility] at [phone] with questions or concerns regarding your invoice.   IF you received labwork today, you will receive an invoice from [facility]. Please contact [facility] at [phone] with questions or concerns regarding your invoice.   Our  billing staff will not be able to assist you with questions regarding bills from these companies.  You will be contacted with the lab results as soon as they are available. The fastest way to get your results is to activate your My Chart account. Instructions are located on the last page of this paperwork. If you have not heard from [place] regarding the results in 2 weeks, please contact this office.       I personally performed the services described in this documentation, which was scribed in my presence. The recorded information has been reviewed and considered for accuracy and completeness, addended by me as needed, and agree with information above.  Signed,   [name], MD [facility] at [place].  01/15/17 2:10 PM

## 2017-01-14 NOTE — Patient Instructions (Addendum)
Blood counts improved. For now continue Cipro twice per day, ibuprofen or Aleve low-dose few times per day with food. See other information below on treatment of epididymitis. I will check a prostate test based on your urinary symptoms, but that should be 0 with your previous prostatectomy.  I am waiting on a call back from your urologist to determine if other changes are needed. Recheck Saturday morning with me, sooner if worse.  For low back pain, Tylenol or NSAID as discussed above.  See information on low back pain. Can discuss this further Saturday if it is not improving.  Return to the clinic or go to the nearest emergency room if any of your symptoms worsen or new symptoms occur.   Epididymitis Epididymitis is swelling (inflammation) of the epididymis. The epididymis is a cord-like structure that is located along the top and back part of the testicle. It collects and stores sperm from the testicle. This condition can also cause pain and swelling of the testicle and scrotum. Symptoms usually start suddenly (acute epididymitis). Sometimes epididymitis starts gradually and lasts for a while (chronic epididymitis). This type may be harder to treat. What are the causes? In men 40 and younger, this condition is usually caused by a bacterial infection or sexually transmitted disease (STD), such as:  Gonorrhea.  Chlamydia.  In men 28 and older who do not have anal sex, this condition is usually caused by bacteria from a blockage or abnormalities in the urinary system. These can result from:  Having a tube placed into the bladder (urinary catheter).  Having an enlarged or inflamed prostate gland.  Having recent urinary tract surgery.  In men who have a condition that weakens the body's defense system (immune system), such as HIV, this condition can be caused by:  Other bacteria, including tuberculosis and syphilis.  Viruses.  Fungi.  Sometimes this condition occurs without infection.  That may happen if urine flows backward into the epididymis after heavy lifting or straining. What increases the risk? This condition is more likely to develop in men:  Who have unprotected sex with more than one partner.  Who have anal sex.  Who have recently had surgery.  Who have a urinary catheter.  Who have urinary problems.  Who have a suppressed immune system.  What are the signs or symptoms? This condition usually begins suddenly with chills, fever, and pain behind the scrotum and in the testicle. Other symptoms include:  Swelling of the scrotum, testicle, or both.  Pain whenejaculatingor urinating.  Pain in the back or belly.  Nausea.  Itching and discharge from the penis.  Frequent need to pass urine.  Redness and tenderness of the scrotum.  How is this diagnosed? Your health care provider can diagnose this condition based on your symptoms and medical history. Your health care provider will also do a physical exam to ask about your symptoms and check your scrotum and testicle for swelling, pain, and redness. You may also have other tests, including:  Examination of discharge from the penis.  Urine tests for infections, such as STDs.  Your health care provider may test you for other STDs, including HIV. How is this treated? Treatment for this condition depends on the cause. If your condition is caused by a bacterial infection, oral antibiotic medicine may be prescribed. If the bacterial infection has spread to your blood, you may need to receive IV antibiotics. Nonbacterial epididymitis is treated with home care that includes bed rest and elevation of the scrotum.  Surgery may be needed to treat:  Bacterial epididymitis that causes pus to build up in the scrotum (abscess).  Chronic epididymitis that has not responded to other treatments.  Follow these instructions at home: Medicines  Take over-the-counter and prescription medicines only as told by your  health care provider.  If you were prescribed an antibiotic medicine, take it as told by your health care provider. Do not stop taking the antibiotic even if your condition improves. Sexual Activity  If your epididymitis was caused by an STD, avoid sexual activity until your treatment is complete.  Inform your sexual partner or partners if you test positive for an STD. They may need to be treated.Do not engage in sexual activity with your partner or partners until their treatment is completed. General instructions  Return to your normal activities as told by your health care provider. Ask your health care provider what activities are safe for you.  Keep your scrotum elevated and supported while resting. Ask your health care provider if you should wear a scrotal support, such as a jockstrap. Wear it as told by your health care provider.  If directed, apply ice to the affected area: ? Put ice in a plastic bag. ? Place a towel between your skin and the bag. ? Leave the ice on for 20 minutes, 2-3 times per day.  Try taking a sitz bath to help with discomfort. This is a warm water bath that is taken while you are sitting down. The water should only come up to your hips and should cover your buttocks. Do this 3-4 times per day or as told by your health care provider.  Keep all follow-up visits as told by your health care provider. This is important. Contact a health care provider if:  You have a fever.  Your pain medicine is not helping.  Your pain is getting worse.  Your symptoms do not improve within three days. This information is not intended to replace advice given to you by your health care provider. Make sure you discuss any questions you have with your health care provider. Document Released: 06/19/2000 Document Revised: 11/28/2015 Document Reviewed: 11/07/2014 Elsevier Interactive Patient Education  2018 [employer].   Back Pain, Adult Back pain is very common in  adults.The cause of back pain is rarely dangerous and the pain often gets better over time.The cause of your back pain may not be known. Some common causes of back pain include:  Strain of the muscles or ligaments supporting the spine.  Wear and tear (degeneration) of the spinal disks.  Arthritis.  Direct injury to the back.  For many people, back pain may return. Since back pain is rarely dangerous, most people can learn to manage this condition on their own. Follow these instructions at home: Watch your back pain for any changes. The following actions may help to lessen any discomfort you are feeling:  Remain active. It is stressful on your back to sit or stand in one place for long periods of time. Do not sit, drive, or stand in one place for more than 30 minutes at a time. Take short walks on even surfaces as soon as you are able.Try to increase the length of time you walk each day.  Exercise regularly as directed by your health care provider. Exercise helps your back heal faster. It also helps avoid future injury by keeping your muscles strong and flexible.  Do not stay in bed.Resting more than 1-2 days can delay your recovery.  Pay attention to your body when you bend and lift. The most comfortable positions are those that put less stress on your recovering back. Always use proper lifting techniques, including: ? Bending your knees. ? Keeping the load close to your body. ? Avoiding twisting.  Find a comfortable position to sleep. Use a firm mattress and lie on your side with your knees slightly bent. If you lie on your back, put a pillow under your knees.  Avoid feeling anxious or stressed.Stress increases muscle tension and can worsen back pain.It is important to recognize when you are anxious or stressed and learn ways to manage it, such as with exercise.  Take medicines only as directed by your health care provider. Over-the-counter medicines to reduce pain and  inflammation are often the most helpful.Your health care provider may prescribe muscle relaxant drugs.These medicines help dull your pain so you can more quickly return to your normal activities and healthy exercise.  Apply ice to the injured area: ? Put ice in a plastic bag. ? Place a towel between your skin and the bag. ? Leave the ice on for 20 minutes, 2-3 times a day for the first 2-3 days. After that, ice and heat may be alternated to reduce pain and spasms.  Maintain a healthy weight. Excess weight puts extra stress on your back and makes it difficult to maintain good posture.  Contact a health care provider if:  You have pain that is not relieved with rest or medicine.  You have increasing pain going down into the legs or buttocks.  You have pain that does not improve in one week.  You have night pain.  You lose weight.  You have a fever or chills. Get help right away if:  You develop new bowel or bladder control problems.  You have unusual weakness or numbness in your arms or legs.  You develop nausea or vomiting.  You develop abdominal pain.  You feel faint. This information is not intended to replace advice given to you by your health care provider. Make sure you discuss any questions you have with your health care provider. Document Released: 06/22/2005 Document Revised: 10/31/2015 Document Reviewed: 10/24/2013 Elsevier Interactive Patient Education  2017 [employer].   IF you received an x-ray today, you will receive an invoice from [facility]. Please contact [facility] at [phone] with questions or concerns regarding your invoice.   IF you received labwork today, you will receive an invoice from [place]. Please contact [facility] at [phone] with questions or concerns regarding your invoice.   Our billing staff will not be able to assist you with questions regarding bills from these companies.  You will be contacted with the lab  results as soon as they are available. The fastest way to get your results is to activate your My Chart account. Instructions are located on the last page of this paperwork. If you have not heard from [place] regarding the results in 2 weeks, please contact this office.

## 2017-01-15 ENCOUNTER — Encounter: Payer: Self-pay | Admitting: Family Medicine

## 2017-01-15 LAB — URINALYSIS, MICROSCOPIC ONLY
Bacteria, UA: NONE SEEN
Casts: NONE SEEN /lpf
Epithelial Cells (non renal): NONE SEEN /hpf (ref 0–10)

## 2017-01-15 LAB — PSA: PROSTATE SPECIFIC AG, SERUM: 1.2 ng/mL (ref 0.0–4.0)

## 2017-01-16 ENCOUNTER — Ambulatory Visit (INDEPENDENT_AMBULATORY_CARE_PROVIDER_SITE_OTHER): Payer: BC Managed Care – PPO | Admitting: Family Medicine

## 2017-01-16 ENCOUNTER — Encounter: Payer: Self-pay | Admitting: Family Medicine

## 2017-01-16 VITALS — BP 120/69 | HR 68 | Temp 97.6°F | Resp 16 | Ht 70.0 in | Wt 144.2 lb

## 2017-01-16 DIAGNOSIS — R3911 Hesitancy of micturition: Secondary | ICD-10-CM

## 2017-01-16 DIAGNOSIS — N451 Epididymitis: Secondary | ICD-10-CM

## 2017-01-16 DIAGNOSIS — R300 Dysuria: Secondary | ICD-10-CM

## 2017-01-16 DIAGNOSIS — N5089 Other specified disorders of the male genital organs: Secondary | ICD-10-CM | POA: Diagnosis not present

## 2017-01-16 LAB — POCT CBC
GRANULOCYTE PERCENT: 70.8 % (ref 37–80)
HCT, POC: 43.3 % — AB (ref 43.5–53.7)
HEMOGLOBIN: 15 g/dL (ref 14.1–18.1)
LYMPH, POC: 1.6 (ref 0.6–3.4)
MCH, POC: 30.6 pg (ref 27–31.2)
MCHC: 34.6 g/dL (ref 31.8–35.4)
MCV: 88.6 fL (ref 80–97)
MID (cbc): 0.6 (ref 0–0.9)
MPV: 7.8 fL (ref 0–99.8)
PLATELET COUNT, POC: 247 10*3/uL (ref 142–424)
POC Granulocyte: 5.4 (ref 2–6.9)
POC LYMPH %: 21.7 % (ref 10–50)
POC MID %: 7.5 %M (ref 0–12)
RBC: 4.89 M/uL (ref 4.69–6.13)
RDW, POC: 12.8 %
WBC: 7.6 10*3/uL (ref 4.6–10.2)

## 2017-01-16 LAB — POC MICROSCOPIC URINALYSIS (UMFC): Mucus: ABSENT

## 2017-01-16 LAB — POCT URINALYSIS DIP (MANUAL ENTRY)
BILIRUBIN UA: NEGATIVE
BILIRUBIN UA: NEGATIVE mg/dL
Blood, UA: NEGATIVE
GLUCOSE UA: NEGATIVE mg/dL
NITRITE UA: NEGATIVE
PH UA: 5 (ref 5.0–8.0)
Protein Ur, POC: NEGATIVE mg/dL
Spec Grav, UA: <=1.005 — AB (ref 1.010–1.025)
Urobilinogen, UA: 0.2 E.U./dL

## 2017-01-16 MED ORDER — MELOXICAM 7.5 MG PO TABS: 7.5000 mg | ORAL_TABLET | Freq: Every day | ORAL | 0 refills | Status: DC

## 2017-01-16 NOTE — Progress Notes (Addendum)
By signing my name below, I, Rodney Adkins, attest that this documentation has been prepared under the direction and in the presence of Rodney Ray, MD.  Electronically Signed: Verlee Adkins, Medical Scribe. 01/16/17. 10:17 AM.  Subjective:    Patient ID: Rodney Adkins, male    DOB: [birth year], 61 y.o.   MRN: [id number]  HPI Chief Complaint  Patient presents with  . Testicle Pain    HPI Comments: Rodney Adkins is a 61 y.o. male who presents to Primary Care at [facility] for epididymidis follow-up. He was initially evaluated July 9th with right sided testicular pain and swelling starting 2 days prior. Right testicle 2-3x bigger than his left on exam. He was also having some low back pain, thought to be flare of DDD from recent travel. Exam was suspected for epididymo-orchitis. He was started on cipro 500 mg BID, ultrasound was obtained on July 9th indicating epididymidis bilaterally, and bilateral hydroceles. He was seen in follow-up 2 days ago with subjective increase in size on the right side, still discomfort, but no fever and leukocytes from initial visit had improved. He was having some lower urinary tract sxs with some white blood cells and red blood cells on urine. PSA obtained at 1.2. He had a prostatectomy on Dec 19th 2017. Previous urine culture on July 9th, no growth. I did place a call to his urologist, Dr. Carl Best at [facility]. Had not talked to them yet. We decided to remain on cipro at last visit with close follow-up, levaquin was discussed but reported tendinopathy in the past. Treated for prostatits June 2017; initially with levaquin. At that time it was thought to have tendonitis, shoulder pain, within a few days on onset levaquin, was changed to augmentin.  Pt feels the same as last visit and his testicular swelling is also the as last visit. He continues to having trouble initiating his urination. There is hesitancy then he urinates. Reports hot and cold spell last night (no  fever), and a little pain in his groin. He suspects he was cold last night from his air conditioner. Pt had a rotator cuff issue June/July 2017, other than the times he took levaquin, but doesn't remember the triggers. He notes his prostatectomy left him with a "shell/outside part", and some remaining prostate tissue. Denies fever.  Has taken aspirin at times for the pain. Not currently on other anti-inflammatory.   Patient Active Problem List   Diagnosis Date Noted  . BPH (benign prostatic hyperplasia) 02/02/2016  . Fractured atrial pacemaker lead wire 07/18/2015  . 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 Chest pain 08/02/2013  . SOB (shortness of breath) 08/02/2013  . PVC (premature ventricular contraction) 02/13/2013  . Chest pain on exertion 02/25/2012  . Pacemaker-dual-chamber Medtronic 04/29/2011  . Change in bowel habits 01/22/2011  . Weight loss 01/22/2011  . COUGH 09/01/2010  . Multiple sclerosis ([place])   . HOCM (hypertrophic obstructive cardiomyopathy) ([place])   . Right bundle branch block   . Complete heart block ([place]) 12/05/2007   Past Medical History:  Diagnosis Date  . Complete heart block [facility]) June 2009   a. Presented with hypotension/pulm edema 12/2007 when HOCM was also discovered. s/p dual chamber MDT pacemaker. Thallium stest 7/09 negative for infiltrative disease or ischemia.  Followed Dr [name]  Marland Kitchen Heart murmur   . HOCM (hypertrophic obstructive cardiomyopathy) ([place]) 2009   Discovered by ECHO when he developed CHB. Mgmt Dr Caryl Comes  and Dr [name] at [facility]. Has outflow obstruction on ECHO  2011  . Multiple sclerosis ([place]) 1998   Sxs started 1998 with L body numbness. MRI c/w MS. Started Avonex. Saw Dr [name] at [facility] and switched to Betaseron and was on for 5 yrs but developed depression and site rxns and D/C'd 2006. While on Betaseron, occassionally required solumedrol for flares.  . Prostate enlargement   . Right bundle branch block   . SOB (shortness of breath)    a. PFTs  09/2010 essentially normal per pulm note (mild obst defect). b. 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  . 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 Pacer reprogrammed with improvement in sx.   Past Surgical History:  Procedure Laterality Date  . LEFT HEART CATHETERIZATION WITH CORONARY ANGIOGRAM N/A 08/22/2013   Procedure: LEFT HEART CATHETERIZATION WITH CORONARY ANGIOGRAM;  Surgeon: [name], MD;  Location: [facility] CATH LAB;  Service: Cardiovascular;  Laterality: N/A;  . LEG TENDON SURGERY  2008   Os peroneus resection and peroneal tendon repair  . pace Agricultural engineer    . PACEMAKER INSERTION  12/2007   Complete heart block  . PACEMAKER LEAD REMOVAL Left 07/18/2015   Procedure: ATRIAL PACEMAKER LEAD EXTRACTION; ATRIAL PACEMAKER LEAD INSERTION; GENERATOR REPLACEMENT.;  Surgeon: [name], MD;  Location: [place];  Service: Cardiovascular;  Laterality: Left;  [name] to back up  . PERIPHERAL VASCULAR CATHETERIZATION N/A 02/12/2015   Procedure: Venogram;  Surgeon: [name], MD;  Location: [place] CV LAB;  Service: Cardiovascular;  Laterality: N/A;  . TEE WITHOUT CARDIOVERSION N/A 07/18/2015   Procedure: TRANSESOPHAGEAL ECHOCARDIOGRAM (TEE);  Surgeon: [name], MD;  Location: [facility] OR;  Service: Cardiovascular;  Laterality: N/A;   No Known Allergies Prior to Admission medications   Medication Sig Start Date End Date Taking? Authorizing Provider  Ascorbic Acid (VITAMIN C) 500 MG tablet Take 500 mg by mouth daily as needed (Vitamin c supplement).    Yes [provider]  cholecalciferol (VITAMIN D) 1000 UNITS tablet Take 1,000 Units by mouth daily. Take 5,000 units daily   Yes [provider]  ciprofloxacin (CIPRO) 500 MG tablet Take 1 tablet (500 mg total) by mouth 2 (two) times daily. 01/11/17  Yes [name],  MD  Dimethyl Fumarate (TECFIDERA) 240 MG CPDR Take 240 mg by mouth 2 (two) times daily.   Yes [provider]  Multiple Vitamin (MULTIVITAMIN) tablet Take 1 tablet by mouth daily.    Yes [provider]   Social History   Social History  . Marital status: Married    Spouse name: N/A  . Number of children: 0  . Years of education: N/A   Occupational History  . professor [employer]   Social History Main Topics  . Smoking status: Never Smoker  . Smokeless tobacco: Never Used  . Alcohol use No  . Drug use: No  . Sexual activity: Not on file   Other Topics Concern  . 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   Review of Systems  Constitutional: Negative for fever.  Cardiovascular: Negative for chest pain.  Endocrine: Positive for cold intolerance and heat intolerance.  Genitourinary: Positive for difficulty urinating and testicular pain.    Objective:  Physical Exam  Constitutional: He appears well-developed and well-nourished. No distress.  HENT:  Head: Normocephalic and atraumatic.  Eyes: Conjunctivae are normal.  Neck: Neck supple.  Cardiovascular: Normal rate.   Pulmonary/Chest: Effort normal.  Abdominal: Soft. He exhibits no distension. There is no tenderness.  Genitourinary: Right testis shows swelling (enalrged at 8x9 cm) and tenderness. No discharge found.  Genitourinary Comments: No apparent inguinal LAD No penile swelling or discharge No external rash  Neurological: He is alert.  Skin: Skin is warm and dry. No rash noted.  Psychiatric: He has a normal mood and affect. His behavior is normal.  Nursing note and vitals reviewed.   Vitals:   01/16/17 0927  BP: 120/69  Pulse: 68  Resp: 16  Temp: 97.6 F (36.4 C)  TempSrc: Oral  SpO2: 98%  Weight: 144 lb 3.2 oz (65.4 kg)  Height: 5\' 10"  (1.778 m)  Body mass index is 20.69 kg/m.   Results for orders placed or performed in visit on  01/16/17  POCT CBC  Result Value Ref Range   WBC 7.6 4.6 - 10.2 K/uL   Lymph, poc 1.6 0.6 - 3.4   POC LYMPH PERCENT 21.7 10 - 50 %L   MID (cbc) 0.6 0 - 0.9   POC MID % 7.5 0 - 12 %M   POC Granulocyte 5.4 2 - 6.9   Granulocyte percent 70.8 37 - 80 %G   RBC 4.89 4.69 - 6.13 M/uL   Hemoglobin 15.0 14.1 - 18.1 g/dL   HCT, POC 43.3 (A) 43.5 - 53.7 %   MCV 88.6 80 - 97 fL   MCH, POC 30.6 27 - 31.2 pg   MCHC 34.6 31.8 - 35.4 g/dL   RDW, POC 12.8 %   Platelet Count, POC 247 142 - 424 K/uL   MPV 7.8 0 - 99.8 fL  POCT Microscopic Urinalysis ([facility])  Result Value Ref Range   WBC,UR,HPF,POC Few (A) None WBC/hpf   RBC,UR,HPF,POC None None RBC/hpf   Bacteria None None, Too numerous to count   Mucus Absent Absent   Epithelial Cells, UR Per Microscopy Few (A) None, Too numerous to count cells/hpf  POCT urinalysis dipstick  Result Value Ref Range   Color, UA yellow yellow   Clarity, UA clear clear   Glucose, UA negative negative mg/dL   Bilirubin, UA negative negative   Ketones, POC UA negative negative mg/dL   Spec Grav, UA <=1.005 (A) 1.010 - 1.025   Blood, UA negative negative   pH, UA 5.0 5.0 - 8.0   Protein Ur, POC negative negative mg/dL   Urobilinogen, UA 0.2 0.2 or 1.0 E.U./dL   Nitrite, UA Negative Negative   Leukocytes, UA Trace (A) Negative   Assessment & Plan:   SARKIS RHINES is a 61 y.o. male Epididymitis - Plan: POCT CBC Urinary hesitancy - Plan: POCT Microscopic Urinalysis ([facility]), POCT urinalysis dipstick Dysuria - Plan: POCT Microscopic Urinalysis ([facility]), POCT urinalysis dipstick Testicular swelling, right - Plan: POCT CBC  - persistent swelling, likely due to hydroceles. Afebrile, CBC improved.  - Discussed with on-call urologist for Dr. Thurmond Butts. Based on provided information recommended to continue Cipro and outpatient treatment, ice and elevation of the scrotum, supportive underwear.    - Advised meloxicam 7.5mg  daily for anti-inflammatory. Avoid other  NSAIDs/aspirin while taking that medication.  - Recheck 3 days, sooner if worse.  No orders of the defined types were placed in this encounter.  Patient Instructions   Urine test was slightly improved, blood count was also slightly improved. I'm still waiting on a call from the  urologist and will let you know later today what they say is far as medication change or treatment change.    IF you received an x-Adkins today, you will receive an invoice from [facility]. Please contact [facility] at [phone] with questions or concerns regarding your invoice.   IF you received labwork today, you will receive an invoice from [place]. Please contact [facility] at [phone] with questions or concerns regarding your invoice.   Our billing staff will not be able to assist you with questions regarding bills from these companies.  You will be contacted with the lab results as soon as they are available. The fastest way to get your results is to activate your My Chart account. Instructions are located on the last page of this paperwork. If you have not heard from [place] regarding the results in 2 weeks, please contact this office.      I personally performed the services described in this documentation, which was scribed in my presence. The recorded information has been reviewed and considered for accuracy and completeness, addended by me as needed, and agree with information above.  Signed,   Rodney Ray, MD Primary Care at [place].  01/16/17 4:56 PM

## 2017-01-16 NOTE — Patient Instructions (Addendum)
Urine test was slightly improved, blood count was also slightly improved. I'm still waiting on a call from the urologist and will let you know later today what they say is far as medication change or treatment change.    IF you received an x-ray today, you will receive an invoice from [facility]. Please contact [facility] at [phone] with questions or concerns regarding your invoice.   IF you received labwork today, you will receive an invoice from [place]. Please contact [facility] at [phone] with questions or concerns regarding your invoice.   Our billing staff will not be able to assist you with questions regarding bills from these companies.  You will be contacted with the lab results as soon as they are available. The fastest way to get your results is to activate your My Chart account. Instructions are located on the last page of this paperwork. If you have not heard from [place] regarding the results in 2 weeks, please contact this office.

## 2017-01-19 ENCOUNTER — Ambulatory Visit: Payer: Self-pay | Admitting: Family Medicine

## 2017-01-19 ENCOUNTER — Encounter: Payer: Self-pay | Admitting: Family Medicine

## 2017-01-19 ENCOUNTER — Ambulatory Visit (INDEPENDENT_AMBULATORY_CARE_PROVIDER_SITE_OTHER): Payer: BC Managed Care – PPO | Admitting: Family Medicine

## 2017-01-19 VITALS — BP 111/71 | HR 68 | Temp 98.0°F | Resp 18 | Ht 69.09 in | Wt 146.0 lb

## 2017-01-19 DIAGNOSIS — N451 Epididymitis: Secondary | ICD-10-CM | POA: Diagnosis not present

## 2017-01-19 MED ORDER — DOXYCYCLINE HYCLATE 100 MG PO CAPS: 100.0000 mg | ORAL_CAPSULE | Freq: Two times a day (BID) | ORAL | 0 refills | Status: DC

## 2017-01-19 MED ORDER — CEFTRIAXONE SODIUM 1 G IJ SOLR
1.0000 g | Freq: Once | INTRAMUSCULAR | Status: AC
  Administered 2017-01-19: 1 g via INTRAMUSCULAR

## 2017-01-19 NOTE — Progress Notes (Signed)
Subjective:    Patient ID: Rodney Adkins, male    DOB: [birth year], 61 y.o.   MRN: [id number]  01/19/2017  Groin Swelling (follow-up, pt states this issuse is not better. )   HPI This 61 y.o. male presents for 72 hour follow-up of epididymitis.  Feeling better yet scrotal swelling has not improved.  Denies fever/chills/sweats.  Denies abdominal pain, nausea, vomiting, diarrhea.  Denies dysuria, urgency, frequency.   Scrotal pain is much improved.  Swelling has not improved.  Compliance with medication.  BP Readings from Last 3 Encounters:  01/25/17 104/64  01/19/17 111/71  01/16/17 120/69   Wt Readings from Last 3 Encounters:  01/25/17 141 lb (64 kg)  01/19/17 146 lb (66.2 kg)  01/16/17 144 lb 3.2 oz (65.4 kg)    Review of Systems  Constitutional: Negative for activity change, appetite change, chills, diaphoresis, fatigue and fever.  Respiratory: Negative for cough and shortness of breath.   Cardiovascular: Negative for chest pain, palpitations and leg swelling.  Gastrointestinal: Negative for abdominal pain, diarrhea, nausea and vomiting.  Endocrine: Negative for cold intolerance, heat intolerance, polydipsia, polyphagia and polyuria.  Genitourinary: Positive for scrotal swelling. Negative for decreased urine volume, difficulty urinating, discharge, dysuria, enuresis, flank pain, frequency, hematuria, penile pain, penile swelling, testicular pain and urgency.  Skin: Negative for color change, rash and wound.  Neurological: Negative for dizziness, tremors, seizures, syncope, facial asymmetry, speech difficulty, weakness, light-headedness, numbness and headaches.  Psychiatric/Behavioral: Negative for dysphoric mood and sleep disturbance. The patient is not nervous/anxious.     Past Medical History:  Diagnosis Date  . Complete heart block [facility]) June 2009   a. Presented with hypotension/pulm edema 12/2007 when HOCM was also discovered. s/p dual chamber MDT pacemaker. Thallium  stest 7/09 negative for infiltrative disease or ischemia.  Followed Dr [name]  Marland Kitchen Heart murmur   . HOCM (hypertrophic obstructive cardiomyopathy) ([place]) 2009   Discovered by ECHO when he developed CHB. Mgmt Dr Caryl Comes  and Dr [name] at [facility]. Has outflow obstruction on ECHO 2011  . Multiple sclerosis ([place]) 1998   Sxs started 1998 with L body numbness. MRI c/w MS. Started Avonex. Saw Dr [name] at [facility] and switched to Betaseron and was on for 5 yrs but developed depression and site rxns and D/C'd 2006. While on Betaseron, occassionally required solumedrol for flares.  . Prostate enlargement   . Right bundle branch block   . SOB (shortness of breath)    a. PFTs 09/2010 essentially normal per pulm note (mild obst defect). b. 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  . 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 Pacer reprogrammed with improvement in sx.   Past Surgical History:  Procedure Laterality Date  . LEFT HEART CATHETERIZATION WITH CORONARY ANGIOGRAM N/A 08/22/2013   Procedure: LEFT HEART CATHETERIZATION WITH CORONARY ANGIOGRAM;  Surgeon: [name], MD;  Location: [facility] CATH LAB;  Service: Cardiovascular;  Laterality: N/A;  . LEG TENDON SURGERY  2008   Os peroneus resection and peroneal tendon repair  . pace [employer]    . PACEMAKER INSERTION  12/2007   Complete heart block  . PACEMAKER LEAD REMOVAL Left 07/18/2015   Procedure: ATRIAL PACEMAKER LEAD EXTRACTION; ATRIAL PACEMAKER LEAD INSERTION; GENERATOR REPLACEMENT.;  Surgeon: [name], MD;  Location: [place];  Service: Cardiovascular;  Laterality: Left;  Servando Snare  to back up  . PERIPHERAL VASCULAR CATHETERIZATION N/A 02/12/2015   Procedure: Venogram;  Surgeon: [name], MD;  Location: [place] CV LAB;  Service: Cardiovascular;  Laterality: N/A;  . TEE WITHOUT CARDIOVERSION N/A  07/18/2015   Procedure: TRANSESOPHAGEAL ECHOCARDIOGRAM (TEE);  Surgeon: [name], MD;  Location: [facility] OR;  Service: Cardiovascular;  Laterality: N/A;   No Known Allergies Current Outpatient Prescriptions  Medication Sig Dispense Refill  . Ascorbic Acid (VITAMIN C) 500 MG tablet Take 500 mg by mouth daily as needed (Vitamin c supplement).     . cholecalciferol (VITAMIN D) 1000 UNITS tablet Take 1,000 Units by mouth daily. Take 5,000 units daily    . Dimethyl Fumarate (TECFIDERA) 240 MG CPDR Take 240 mg by mouth 2 (two) times daily.    . meloxicam (MOBIC) 7.5 MG tablet Take 1 tablet (7.5 mg total) by mouth daily. 30 tablet 0  . Multiple Vitamin (MULTIVITAMIN) tablet Take 1 tablet by mouth daily.     Marland Kitchen doxycycline (VIBRAMYCIN) 100 MG capsule Take 1 capsule (100 mg total) by mouth 2 (two) times daily. 20 capsule 0   No current facility-administered medications for this visit.     Family History  Problem Relation Age of Onset  . Colon polyps Father   . Hypertension Father   . Diabetes Paternal Grandmother   . Heart disease Neg Hx        Objective:    BP 111/71   Pulse 68   Temp 98 F (36.7 C) (Oral)   Resp 18   Ht 5' 9.09" (1.755 m)   Wt 146 lb (66.2 kg)   SpO2 98%   BMI 21.50 kg/m  Physical Exam  Constitutional: He is oriented to person, place, and time. He appears well-developed and well-nourished. No distress.  HENT:  Head: Normocephalic and atraumatic.  Right Ear: External ear normal.  Left Ear: External ear normal.  Nose: Nose normal.  Mouth/Throat: Oropharynx is clear and moist.  Eyes: Pupils are equal, round, and reactive to light. Conjunctivae and EOM are normal.  Neck: Normal range of motion. Neck supple. Carotid bruit is not present. No thyromegaly present.  Cardiovascular: Normal rate, regular rhythm, normal heart sounds and intact distal pulses.  Exam reveals no gallop and no friction rub.   No murmur heard. Pulmonary/Chest: Effort normal and breath sounds  normal. He has no wheezes. He has no rales.  Abdominal: Soft. Bowel sounds are normal. He exhibits no distension and no mass. There is no tenderness. There is no rebound and no guarding. Hernia confirmed negative in the right inguinal area and confirmed negative in the left inguinal area.  Genitourinary: Penis normal. Right testis shows swelling and tenderness. Right testis shows no mass.  Genitourinary Comments: Large swelling R scrotal region with TTP; no warmth; no LAD.  Lymphadenopathy:    He has no cervical adenopathy.       Right: No inguinal adenopathy present.       Left: No inguinal adenopathy present.  Neurological: He is alert and oriented to person, place, and time. No cranial nerve deficit.  Skin: Skin is warm and dry. No rash noted. He is not diaphoretic.  Psychiatric: He has a normal mood and affect. His behavior is normal.  Nursing note and vitals reviewed.  Results for orders placed or performed in visit on 01/16/17  POCT CBC  Result Value Ref Range   WBC 7.6 4.6 - 10.2 K/uL   Lymph, poc 1.6 0.6 - 3.4  POC LYMPH PERCENT 21.7 10 - 50 %L   MID (cbc) 0.6 0 - 0.9   POC MID % 7.5 0 - 12 %M   POC Granulocyte 5.4 2 - 6.9   Granulocyte percent 70.8 37 - 80 %G   RBC 4.89 4.69 - 6.13 M/uL   Hemoglobin 15.0 14.1 - 18.1 g/dL   HCT, POC 43.3 (A) 43.5 - 53.7 %   MCV 88.6 80 - 97 fL   MCH, POC 30.6 27 - 31.2 pg   MCHC 34.6 31.8 - 35.4 g/dL   RDW, POC 12.8 %   Platelet Count, POC 247 142 - 424 K/uL   MPV 7.8 0 - 99.8 fL  POCT Microscopic Urinalysis ([facility])  Result Value Ref Range   WBC,UR,HPF,POC Few (A) None WBC/hpf   RBC,UR,HPF,POC None None RBC/hpf   Bacteria None None, Too numerous to count   Mucus Absent Absent   Epithelial Cells, UR Per Microscopy Few (A) None, Too numerous to count cells/hpf  POCT urinalysis dipstick  Result Value Ref Range   Color, UA yellow yellow   Clarity, UA clear clear   Glucose, UA negative negative mg/dL   Bilirubin, UA negative negative    Ketones, POC UA negative negative mg/dL   Spec Grav, UA <=1.005 (A) 1.010 - 1.025   Blood, UA negative negative   pH, UA 5.0 5.0 - 8.0   Protein Ur, POC negative negative mg/dL   Urobilinogen, UA 0.2 0.2 or 1.0 E.U./dL   Nitrite, UA Negative Negative   Leukocytes, UA Trace (A) Negative       Assessment & Plan:   1. Epididymitis, right    -clinical improvement with decreased pain, resolution of dysuria, normal WBC, no fever.  S/p scrotal ultrasound that revealed epididymitis.  S/p Rocephin in office; continue Cipro.  Consider repeat scrotal US in no improvement. -rx for Doxycycline provided to take for antiinflammatory benefit as well as abx therapy due to upcoming travel. -continue NSAID with Meloxicam, ice, elevation.  No orders of the defined types were placed in this encounter.  Meds ordered this encounter  Medications  . cefTRIAXone (ROCEPHIN) injection 1 g  . DISCONTD: doxycycline (VIBRAMYCIN) 100 MG capsule    Sig: Take 1 capsule (100 mg total) by mouth 2 (two) times daily.    Dispense:  20 capsule    Refill:  0    No Follow-up on file.   Kately Graffam Elayne Guerin, M.D. Primary Care at [facility] [address] [place], [place]  [address] [phone] phone [phone] fax

## 2017-01-19 NOTE — Patient Instructions (Addendum)
IF you received an x-ray today, you will receive an invoice from [facility]. Please contact [facility] at [phone] with questions or concerns regarding your invoice.   IF you received labwork today, you will receive an invoice from [place]. Please contact [facility] at [phone] with questions or concerns regarding your invoice.   Our billing staff will not be able to assist you with questions regarding bills from these companies.  You will be contacted with the lab results as soon as they are available. The fastest way to get your results is to activate your My Chart account. Instructions are located on the last page of this paperwork. If you have not heard from [place] regarding the results in 2 weeks, please contact this office.      Epididymitis Epididymitis is swelling (inflammation) of the epididymis. The epididymis is a cord-like structure that is located along the top and back part of the testicle. It collects and stores sperm from the testicle. This condition can also cause pain and swelling of the testicle and scrotum. Symptoms usually start suddenly (acute epididymitis). Sometimes epididymitis starts gradually and lasts for a while (chronic epididymitis). This type may be harder to treat. What are the causes? In men 16 and younger, this condition is usually caused by a bacterial infection or sexually transmitted disease (STD), such as:  Gonorrhea.  Chlamydia.  In men 74 and older who do not have anal sex, this condition is usually caused by bacteria from a blockage or abnormalities in the urinary system. These can result from:  Having a tube placed into the bladder (urinary catheter).  Having an enlarged or inflamed prostate gland.  Having recent urinary tract surgery.  In men who have a condition that weakens the body's defense system (immune system), such as HIV, this condition can be caused by:  Other bacteria, including tuberculosis and  syphilis.  Viruses.  Fungi.  Sometimes this condition occurs without infection. That may happen if urine flows backward into the epididymis after heavy lifting or straining. What increases the risk? This condition is more likely to develop in men:  Who have unprotected sex with more than one partner.  Who have anal sex.  Who have recently had surgery.  Who have a urinary catheter.  Who have urinary problems.  Who have a suppressed immune system.  What are the signs or symptoms? This condition usually begins suddenly with chills, fever, and pain behind the scrotum and in the testicle. Other symptoms include:  Swelling of the scrotum, testicle, or both.  Pain whenejaculatingor urinating.  Pain in the back or belly.  Nausea.  Itching and discharge from the penis.  Frequent need to pass urine.  Redness and tenderness of the scrotum.  How is this diagnosed? Your health care provider can diagnose this condition based on your symptoms and medical history. Your health care provider will also do a physical exam to ask about your symptoms and check your scrotum and testicle for swelling, pain, and redness. You may also have other tests, including:  Examination of discharge from the penis.  Urine tests for infections, such as STDs.  Your health care provider may test you for other STDs, including HIV. How is this treated? Treatment for this condition depends on the cause. If your condition is caused by a bacterial infection, oral antibiotic medicine may be prescribed. If the bacterial infection has spread to your blood, you may need to receive IV antibiotics. Nonbacterial epididymitis is treated with home  care that includes bed rest and elevation of the scrotum. Surgery may be needed to treat:  Bacterial epididymitis that causes pus to build up in the scrotum (abscess).  Chronic epididymitis that has not responded to other treatments.  Follow these instructions at  home: Medicines  Take over-the-counter and prescription medicines only as told by your health care provider.  If you were prescribed an antibiotic medicine, take it as told by your health care provider. Do not stop taking the antibiotic even if your condition improves. Sexual Activity  If your epididymitis was caused by an STD, avoid sexual activity until your treatment is complete.  Inform your sexual partner or partners if you test positive for an STD. They may need to be treated.Do not engage in sexual activity with your partner or partners until their treatment is completed. General instructions  Return to your normal activities as told by your health care provider. Ask your health care provider what activities are safe for you.  Keep your scrotum elevated and supported while resting. Ask your health care provider if you should wear a scrotal support, such as a jockstrap. Wear it as told by your health care provider.  If directed, apply ice to the affected area: ? Put ice in a plastic bag. ? Place a towel between your skin and the bag. ? Leave the ice on for 20 minutes, 2-3 times per day.  Try taking a sitz bath to help with discomfort. This is a warm water bath that is taken while you are sitting down. The water should only come up to your hips and should cover your buttocks. Do this 3-4 times per day or as told by your health care provider.  Keep all follow-up visits as told by your health care provider. This is important. Contact a health care provider if:  You have a fever.  Your pain medicine is not helping.  Your pain is getting worse.  Your symptoms do not improve within three days. This information is not intended to replace advice given to you by your health care provider. Make sure you discuss any questions you have with your health care provider. Document Released: 06/19/2000 Document Revised: 11/28/2015 Document Reviewed: 11/07/2014 Elsevier Interactive Patient  Education  2018 [employer].

## 2017-01-21 ENCOUNTER — Telehealth: Payer: Self-pay | Admitting: Family Medicine

## 2017-01-21 ENCOUNTER — Other Ambulatory Visit: Payer: Self-pay

## 2017-01-21 MED ORDER — DOXYCYCLINE HYCLATE 100 MG PO CAPS: 100.0000 mg | ORAL_CAPSULE | Freq: Two times a day (BID) | ORAL | 0 refills | Status: DC

## 2017-01-21 NOTE — Telephone Encounter (Signed)
Spoke with pt today and informed him I have  resent his Rx to his pharmacy.

## 2017-01-21 NOTE — Telephone Encounter (Signed)
PATIENT STATES HE SAW DR. Tamala Julian ON Tuesday (01/19/17) AND SHE PRESCRIBED HIM TO HAVE DOXYCYCLINE 100 MG. HE SAID HIS WIFE WAS CLEANING AND HE THINKS SHE THREW THE BAG AWAY THAT HIS MEDICINE WAS IN. HE NEEDS TO HAVE IT CALLED BACK INTO HIS PHARMACY. BEST PHONE [phone] (CELL) PHARMACY CHOICE IS [place]. [place]

## 2017-01-22 ENCOUNTER — Ambulatory Visit: Payer: BC Managed Care – PPO | Admitting: Family Medicine

## 2017-01-25 ENCOUNTER — Ambulatory Visit (INDEPENDENT_AMBULATORY_CARE_PROVIDER_SITE_OTHER): Payer: BC Managed Care – PPO | Admitting: Family Medicine

## 2017-01-25 VITALS — BP 104/64 | HR 78 | Temp 98.0°F | Resp 16 | Ht 68.5 in | Wt 141.0 lb

## 2017-01-25 DIAGNOSIS — N433 Hydrocele, unspecified: Secondary | ICD-10-CM | POA: Diagnosis not present

## 2017-01-25 DIAGNOSIS — N451 Epididymitis: Secondary | ICD-10-CM | POA: Diagnosis not present

## 2017-01-25 DIAGNOSIS — N5089 Other specified disorders of the male genital organs: Secondary | ICD-10-CM

## 2017-01-25 NOTE — Progress Notes (Signed)
Subjective:  By signing my name below, I, Essence Howell, attest that this documentation has been prepared under the direction and in the presence of [name], MD Electronically Signed: Ladene Artist, ED Scribe 01/25/2017 at 10:24 AM.   Patient ID: Rodney Adkins, male    DOB: 1956/03/10, 61 y.o.   MRN: [id number]  Chief Complaint  Patient presents with  . Follow-up    groin swelling   HPI Rodney CHARLOT is a 61 y.o. male who presents to Primary Care at [facility] follow-up of bilateral epididymitis, predominant right side swelling and hydrocele. Initially started on Cipro on 01/11/17. Had [place] confirming his diagnosis. I last saw him on 7/14 and discussed with on-call urologist. Recommend continuing Cipro, ice and elevation of the scrotum, as well as NSAID. He was seen by Dr. Tamala Julian on 7/17 and started on doxycyline 100 mg bid and given an injection of Rocephin.  Today, pt states that swelling is unchanged. Pt reports that urinary frequency has decreased and mild dysuria. States he has been compliant with Meloxicam, ice and elevation. He denies fever, chills, abdominal pain. No intolerances to doxycycline or Cipro including tendon pain which he had with Levaquin previously. Pt will be going out of town in 2 days.   Patient Active Problem List   Diagnosis Date Noted  . BPH (benign prostatic hyperplasia) 02/02/2016  . Fractured atrial pacemaker lead wire 07/18/2015  . 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 Chest pain 08/02/2013  . SOB (shortness of breath) 08/02/2013  . PVC (premature ventricular contraction) 02/13/2013  . Chest pain on exertion 02/25/2012  . Pacemaker-dual-chamber Medtronic 04/29/2011  . Change in bowel habits 01/22/2011  . Weight loss 01/22/2011  . COUGH 09/01/2010  . Multiple sclerosis ([place])   . HOCM (hypertrophic obstructive cardiomyopathy) ([place])   . Right bundle branch block   . Complete heart block ([place]) 12/05/2007   Past Medical History:  Diagnosis Date    . Complete heart block [facility]) June 2009   a. Presented with hypotension/pulm edema 12/2007 when HOCM was also discovered. s/p dual chamber MDT pacemaker. Thallium stest 7/09 negative for infiltrative disease or ischemia.  Followed Dr [name]  Marland Kitchen Heart murmur   . HOCM (hypertrophic obstructive cardiomyopathy) ([place]) 2009   Discovered by ECHO when he developed CHB. Mgmt Dr Caryl Comes  and Dr [name] at [facility]. Has outflow obstruction on ECHO 2011  . Multiple sclerosis ([place]) 1998   Sxs started 1998 with L body numbness. MRI c/w MS. Started Avonex. Saw Dr [name] at [facility] and switched to Betaseron and was on for 5 yrs but developed depression and site rxns and D/C'd 2006. While on Betaseron, occassionally required solumedrol for flares.  . Prostate enlargement   . Right bundle branch block   . SOB (shortness of breath)    a. PFTs 09/2010 essentially normal per pulm note (mild obst defect). b. 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  . 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 Pacer reprogrammed with improvement in sx.   Past Surgical History:  Procedure Laterality Date  . LEFT HEART CATHETERIZATION WITH CORONARY ANGIOGRAM N/A 08/22/2013   Procedure: LEFT HEART CATHETERIZATION WITH CORONARY ANGIOGRAM;  Surgeon: [name], MD;  Location: [facility] C[facility] LAB;  Service: Cardiovascular;  Laterality: N/A;  . LEG TENDON SURGERY  2008   Os peroneus  resection and peroneal tendon repair  . pace Agricultural engineer    . PACEMAKER INSERTION  12/2007   Complete heart block  . PACEMAKER LEAD REMOVAL Left 07/18/2015   Procedure: ATRIAL PACEMAKER LEAD EXTRACTION; ATRIAL PACEMAKER LEAD INSERTION; GENERATOR REPLACEMENT.;  Surgeon: [name], MD;  Location: [address];  Service: Cardiovascular;  Laterality: Left;  [name] to back up  . PERIPHERAL VASCULAR CATHETERIZATION N/A 02/12/2015    Procedure: Venogram;  Surgeon: [name], MD;  Location: [place] CV LAB;  Service: Cardiovascular;  Laterality: N/A;  . TEE WITHOUT CARDIOVERSION N/A 07/18/2015   Procedure: TRANSESOPHAGEAL ECHOCARDIOGRAM (TEE);  Surgeon: [name], MD;  Location: [facility] OR;  Service: Cardiovascular;  Laterality: N/A;   No Known Allergies Prior to Admission medications   Medication Sig Start Date End Date Taking? Authorizing Provider  Ascorbic Acid (VITAMIN C) 500 MG tablet Take 500 mg by mouth daily as needed (Vitamin c supplement).     [provider]  cholecalciferol (VITAMIN D) 1000 UNITS tablet Take 1,000 Units by mouth daily. Take 5,000 units daily    [provider]  ciprofloxacin (CIPRO) 500 MG tablet Take 1 tablet (500 mg total) by mouth 2 (two) times daily. 01/11/17   [name], MD  Dimethyl Fumarate (TECFIDERA) 240 MG CPDR Take 240 mg by mouth 2 (two) times daily.    [provider]  doxycycline (VIBRAMYCIN) 100 MG capsule Take 1 capsule (100 mg total) by mouth 2 (two) times daily. 01/21/17   Wardell Honour, MD  meloxicam (MOBIC) 7.5 MG tablet Take 1 tablet (7.5 mg total) by mouth daily. 01/16/17   [name], MD  Multiple Vitamin (MULTIVITAMIN) tablet Take 1 tablet by mouth daily.     [provider]   Social History   Social History  . Marital status: Married    Spouse name: N/A  . Number of children: 0  . Years of education: N/A   Occupational History  . professor [employer]   Social History Main Topics  . Smoking status: Never Smoker  . Smokeless tobacco: Never Used  . Alcohol use No  . Drug use: No  . Sexual activity: Not on file   Other Topics Concern  . 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   Review of Systems  Constitutional: Negative for chills and fever.  Gastrointestinal: Negative for abdominal pain.  Genitourinary: Positive for dysuria (mild), frequency  (mild) and scrotal swelling.      Objective:   Physical Exam  Constitutional: He is oriented to person, place, and time. He appears well-developed and well-nourished. No distress.  HENT:  Head: Normocephalic and atraumatic.  Eyes: Conjunctivae and EOM are normal.  Neck: Neck supple. No tracheal deviation present.  Cardiovascular: Normal rate.   Pulmonary/Chest: Effort normal. No respiratory distress.  Genitourinary:  Genitourinary Comments: Significantly enlarged R testicle. ~7-8 cm in length. Slight tenderness at epididymis. Inguinal fold non-tender.  Musculoskeletal: Normal range of motion.  Neurological: He is alert and oriented to person, place, and time.  Skin: Skin is warm and dry.  Psychiatric: He has a normal mood and affect. His behavior is normal.  Nursing note and vitals reviewed.  Vitals:   01/25/17 0944  BP: 104/64  Pulse: 78  Resp: 16  Temp: 98 F (36.7 C)  TempSrc: Oral  SpO2: 99%  Weight: 141 lb (64 kg)  Height: 5' 8.5" (1.74 m)  Assessment & Plan:  Rodney Adkins is a 61 y.o. male Swelling of right testicle  Epididymitis  Hydrocele, unspecified hydrocele type  Afebrile, overall swelling appears similar. Less tender, suspect some clinical improvement. Phone call placed to his urologist at [facility] for appointment within the next 2 days if possible as he is heading out of town. Continued on doxycycline as prescribed by Dr. Tamala Julian last week. Continue scrotal elevation and other symptomatic care, continue meloxicam for anti-inflammatory effect.   No orders of the defined types were placed in this encounter.  Patient Instructions   Although the area is less tender, I do appreciate continued swelling. We will try to have you seen today or tomorrow by urology based on heading out of town on Wednesday. Continue doxycycline and meloxicam at same doses for now. Continue other symptomatic care including scrotal elevation, ice if needed, supportive  underwear.  You should receive a call from our office or urology sometime this afternoon.    IF you received an x-ray today, you will receive an invoice from [facility]. Please contact [facility] at [phone] with questions or concerns regarding your invoice.   IF you received labwork today, you will receive an invoice from [facility]. Please contact [facility] at [phone] with questions or concerns regarding your invoice.   Our billing staff will not be able to assist you with questions regarding bills from these companies.  You will be contacted with the lab results as soon as they are available. The fastest way to get your results is to activate your My Chart account. Instructions are located on the last page of this paperwork. If you have not heard from [place] regarding the results in 2 weeks, please contact this office.       I personally performed the services described in this documentation, which was scribed in my presence. The recorded information has been reviewed and considered for accuracy and completeness, addended by me as needed, and agree with information above.  Signed,   [name], MD Primary Care at [place].  01/27/17 3:42 PM

## 2017-01-25 NOTE — Patient Instructions (Addendum)
Although the area is less tender, I do appreciate continued swelling. We will try to have you seen today or tomorrow by urology based on heading out of town on Wednesday. Continue doxycycline and meloxicam at same doses for now. Continue other symptomatic care including scrotal elevation, ice if needed, supportive underwear.  You should receive a call from our office or urology sometime this afternoon.    IF you received an x-ray today, you will receive an invoice from [facility]. Please contact [facility] at [phone] with questions or concerns regarding your invoice.   IF you received labwork today, you will receive an invoice from [facility]. Please contact [facility] at [phone] with questions or concerns regarding your invoice.   Our billing staff will not be able to assist you with questions regarding bills from these companies.  You will be contacted with the lab results as soon as they are available. The fastest way to get your results is to activate your My Chart account. Instructions are located on the last page of this paperwork. If you have not heard from [place] regarding the results in 2 weeks, please contact this office.

## 2017-01-27 ENCOUNTER — Encounter: Payer: Self-pay | Admitting: Family Medicine

## 2017-01-28 ENCOUNTER — Telehealth: Payer: Self-pay | Admitting: Emergency Medicine

## 2017-01-28 NOTE — Telephone Encounter (Signed)
Left a message to return message regarding status and f/u on Urology visit.

## 2017-01-28 NOTE — Telephone Encounter (Signed)
-----   Message from [name], MD sent at 01/27/2017  3:43 PM EDT ----- Jenny Reichmann was trying to call this patient's urologist on Monday for an appointment within the next few days. He was supposed to be heading out of town today or tomorrow. Can we see how he is doing and did he receive a call from urology?

## 2017-04-30 ENCOUNTER — Ambulatory Visit (INDEPENDENT_AMBULATORY_CARE_PROVIDER_SITE_OTHER): Payer: BC Managed Care – PPO | Admitting: Internal Medicine

## 2017-04-30 ENCOUNTER — Encounter: Payer: Self-pay | Admitting: Internal Medicine

## 2017-04-30 VITALS — BP 100/62 | HR 65 | Ht 70.0 in | Wt 144.0 lb

## 2017-04-30 DIAGNOSIS — R0609 Other forms of dyspnea: Secondary | ICD-10-CM

## 2017-04-30 DIAGNOSIS — I442 Atrioventricular block, complete: Secondary | ICD-10-CM | POA: Diagnosis not present

## 2017-04-30 DIAGNOSIS — Z45018 Encounter for adjustment and management of other part of cardiac pacemaker: Secondary | ICD-10-CM

## 2017-04-30 DIAGNOSIS — I517 Cardiomegaly: Secondary | ICD-10-CM | POA: Diagnosis not present

## 2017-04-30 LAB — CUP PACEART INCLINIC DEVICE CHECK
Date Time Interrogation Session: 20181026163200
Implantable Lead Implant Date: 20090608
Implantable Lead Location: 753859
Implantable Lead Model: 5076
Implantable Pulse Generator Implant Date: 20170112
Lead Channel Setting Pacing Amplitude: 2.5 V
MDC IDC LEAD IMPLANT DT: 20170112
MDC IDC LEAD LOCATION: 753860
MDC IDC SET LEADCHNL RA PACING AMPLITUDE: 2 V
MDC IDC SET LEADCHNL RV PACING PULSEWIDTH: 0.4 ms
MDC IDC SET LEADCHNL RV SENSING SENSITIVITY: 5.6 mV

## 2017-04-30 NOTE — Patient Instructions (Signed)
Medication Instructions:  Your physician recommends that you continue on your current medications as directed. Please refer to the Current Medication list given to you today.  Labwork: None ordered  Testing/Procedures: None ordered  Follow-Up: Your physician wants you to follow-up in: 1 year with Dr. [name]  You will receive a reminder letter in the mail two months in advance. If you don't receive a letter, please call our office to schedule the follow-up appointment.  -- If you need a refill on your cardiac medications before your next appointment, please call your pharmacy. --  Thank you for choosing [facility]!!     Any Other Special Instructions Will Be Listed Below (If Applicable).

## 2017-04-30 NOTE — Progress Notes (Signed)
Mostly inferior skf      Patient Care Team: [name], MD as PCP - General [name], MD as Consulting Physician (Gastroenterology) [name], MD as Consulting Physician (Cardiology)   HPI  Rodney Adkins is a 61 y.o. male Seen in followup for HTN with complete heart block status post pacing. He has been seen again recently because of exercise intolerance previously had been associated with upper rate Wenckebach behavior. Because of episodes of chest pain he underwent catheterization demonstrating no obstructive coronary disease but there were gradients.LVEDP>>5  He had a broken atrial lead. He saw both Dr. Elliot Cousin and [facility] and elected to have his extraction done here. It was done by Dr. Lovena Le 1/17  He continues  to struggle with dyspnea.and 4/17 underwent  cardiopulmonary stress testing which was reviewed. He describes orthostatic hypotensive blood pressure responses; heart rate peaked 158; peak blood pressures recorded 140. 4/17 Echo LVEF 60-65% with gradeint resting 29  Not  Exercised.  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   He continues to struggle w DOE, variable partly but not wholly related to sleep ( 3 hrs :((((       Past Medical History:  Diagnosis Date  . Complete heart block [facility]) June 2009   a. Presented with hypotension/pulm edema 12/2007 when HOCM was also discovered. s/p dual chamber MDT pacemaker. Thallium stest 7/09 negative for infiltrative disease or ischemia.  Followed Dr [name]  Marland Kitchen Heart murmur   . HOCM (hypertrophic obstructive cardiomyopathy) ([place]) 2009   Discovered by ECHO when he developed CHB. Mgmt Dr Caryl Comes  and Dr [name] at [facility]. Has outflow obstruction on ECHO 2011  . Multiple sclerosis ([place]) 1998   Sxs started 1998 with L body numbness. MRI c/w MS. Started Avonex. Saw Dr [name] at [facility] and switched to Betaseron and was on for 5 yrs but  developed depression and site rxns and D/C'd 2006. While on Betaseron, occassionally required solumedrol for flares.  . Prostate enlargement   . Right bundle branch block   . SOB (shortness of breath)    a. PFTs 09/2010 essentially normal per pulm note (mild obst defect). b. 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  . 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 Pacer reprogrammed with improvement in sx.    Past Surgical History:  Procedure Laterality Date  . LEFT HEART CATHETERIZATION WITH CORONARY ANGIOGRAM N/A 08/22/2013   Procedure: LEFT HEART CATHETERIZATION WITH CORONARY ANGIOGRAM;  Surgeon: [name], MD;  Location: [facility] CATH LAB;  Service: Cardiovascular;  Laterality: N/A;  . LEG TENDON SURGERY  2008   Os peroneus resection and peroneal tendon repair  . pace Agricultural engineer    . PACEMAKER INSERTION  12/2007   Complete heart block  . PACEMAKER LEAD REMOVAL Left 07/18/2015   Procedure: ATRIAL PACEMAKER LEAD EXTRACTION; ATRIAL PACEMAKER LEAD INSERTION; GENERATOR REPLACEMENT.;  Surgeon: [name], MD;  Location: [place];  Service: Cardiovascular;  Laterality: Left;  [name] to back up  . PERIPHERAL VASCULAR CATHETERIZATION N/A 02/12/2015   Procedure: Venogram;  Surgeon: [name], MD;  Location: [place] CV LAB;  Service: Cardiovascular;  Laterality: N/A;  . TEE WITHOUT CARDIOVERSION N/A 07/18/2015   Procedure: TRANSESOPHAGEAL ECHOCARDIOGRAM (TEE);  Surgeon: [name],  MD;  Location: [facility] OR;  Service: Cardiovascular;  Laterality: N/A;    Current Outpatient Prescriptions  Medication Sig Dispense Refill  . Ascorbic Acid (VITAMIN C) 500 MG tablet Take 500 mg by mouth daily as needed (Vitamin c supplement).     . cholecalciferol (VITAMIN D) 1000 UNITS tablet Take 1,000 Units by mouth daily. Take 5,000 units daily    . Dimethyl  Fumarate (TECFIDERA) 240 MG CPDR Take 240 mg by mouth 2 (two) times daily.    . Multiple Vitamin (MULTIVITAMIN) tablet Take 1 tablet by mouth daily.      No current facility-administered medications for this visit.     No Known Allergies  Review of Systems negative except from HPI and PMH  Physical Exam BP 100/62   Pulse 65   Ht 5\' 10"  (1.778 m)   Wt 144 lb (65.3 kg)   SpO2 98%   BMI 20.66 kg/m  Well developed and well nourished in no acute distress HENT normal E scleral and icterus clear Neck Supple JVP flat; carotids brisk and full Clear to ausculation Device pocket well healed; without hematoma or erythema.  There is no tethering   Regular rate and rhythm, 2/6 murmur enhances With Valsalva  Soft with active bowel sounds No clubbing cyanosis no  Edema Alert and oriented, grossly normal motor and sensory function Skin Warm and Dry   ECG ordered today demonstrates sinus rhythm at 64 with P-synchronous/ AV  pacing  Assessment and  Plan  HCM  Complete Heart Block  Pacer Medtronic The patient's device was interrogated.  The information was reviewed. No changes were made in the programming.     Dyspnea on exertion  Exercise assoc hypotension--no longer evident   Continues to struggle with DOE, some variablity related to sleep but not all Reviewed the stress echo with Dr MS-- No exercise MR, gradient >>50 ;  Not a lot of preexcitation of the intraventricular septum  No evidence of transmitral inflow  Discussed options incl disopyramide, now potentially doable following prostatectomy.  CCB and BB have been challenge in past 2/2 hypotension.  If symptoms severe enough myectomy could be option  I will reach out to Dr Jacelyn Grip regarding gradient of 50   We have reprogrammed the AV delay 120/90>>110/80  More than 50% of 45 min was spent in counseling related to the above

## 2017-05-02 ENCOUNTER — Encounter: Payer: Self-pay | Admitting: Internal Medicine

## 2017-05-03 NOTE — Telephone Encounter (Signed)
Appt scheduled for tomorrow at 1100. Patient verbalized understanding.

## 2017-05-04 ENCOUNTER — Ambulatory Visit (INDEPENDENT_AMBULATORY_CARE_PROVIDER_SITE_OTHER): Payer: Self-pay | Admitting: *Deleted

## 2017-05-04 DIAGNOSIS — Z45018 Encounter for adjustment and management of other part of cardiac pacemaker: Secondary | ICD-10-CM

## 2017-05-04 DIAGNOSIS — I442 Atrioventricular block, complete: Secondary | ICD-10-CM

## 2017-05-04 DIAGNOSIS — I517 Cardiomegaly: Secondary | ICD-10-CM

## 2017-05-04 LAB — CUP PACEART INCLINIC DEVICE CHECK
Date Time Interrogation Session: 20181030114503
Implantable Lead Implant Date: 20090608
Implantable Lead Location: 753859
Implantable Lead Model: 5076
Implantable Lead Model: 5076
Implantable Pulse Generator Implant Date: 20170112
MDC IDC LEAD IMPLANT DT: 20170112
MDC IDC LEAD LOCATION: 753860

## 2017-05-04 NOTE — Progress Notes (Signed)
Rodney Adkins presents today to the [facility] for PPM to be reprogrammed back to previous settings. Scanned document from 04/30/17 OV with Dr. Caryl Comes has no changes per [name] (Medtronic). The patient reports that Dr. Caryl Comes later brought the programmer back in the room and made changes.  Documentation from Dr. Caryl Comes: We have reprogrammed the AV delay 120/90>>110/80 Programming upon interrogation today: PAV 17ms, SAV 174ms, Rate adaptive AVD is on, minimum sensed AVD 80ms.  Changes made today based on my conclusions of what was changed 04/30/17: SAV 159ms, Min SAV (rate adaptive AVD) 73ms.  Patient aware to call back if he does not notice improvement. I will verify these changes with Dr. Caryl Comes this afternoon.

## 2017-08-02 ENCOUNTER — Ambulatory Visit (INDEPENDENT_AMBULATORY_CARE_PROVIDER_SITE_OTHER): Payer: BC Managed Care – PPO | Admitting: *Deleted

## 2017-08-02 ENCOUNTER — Telehealth: Payer: Self-pay | Admitting: Cardiology

## 2017-08-02 DIAGNOSIS — I442 Atrioventricular block, complete: Secondary | ICD-10-CM | POA: Diagnosis not present

## 2017-08-02 NOTE — Telephone Encounter (Signed)
LMOVM reminding pt to send remote transmission.   

## 2017-08-06 ENCOUNTER — Encounter: Payer: Self-pay | Admitting: Cardiology

## 2017-08-06 NOTE — Progress Notes (Signed)
Remote pacemaker transmission.   

## 2017-08-11 LAB — CUP PACEART REMOTE DEVICE CHECK
Battery Voltage: 3.01 V
Brady Statistic AP VP Percent: 15.97 %
Brady Statistic AS VP Percent: 84.02 %
Brady Statistic AS VS Percent: 0 %
Brady Statistic RA Percent Paced: 15.97 %
Implantable Lead Implant Date: 20090608
Implantable Lead Implant Date: 20170112
Implantable Lead Location: 753860
Implantable Lead Model: 5076
Implantable Pulse Generator Implant Date: 20170112
Lead Channel Impedance Value: 361 Ohm
Lead Channel Impedance Value: 437 Ohm
Lead Channel Impedance Value: 551 Ohm
Lead Channel Pacing Threshold Amplitude: 0.5 V
Lead Channel Pacing Threshold Amplitude: 0.875 V
Lead Channel Pacing Threshold Pulse Width: 0.4 ms
Lead Channel Pacing Threshold Pulse Width: 0.4 ms
Lead Channel Sensing Intrinsic Amplitude: 1.125 mV
MDC IDC LEAD LOCATION: 753859
MDC IDC MSMT BATTERY REMAINING LONGEVITY: 91 mo
MDC IDC MSMT LEADCHNL RA IMPEDANCE VALUE: 456 Ohm
MDC IDC MSMT LEADCHNL RA SENSING INTR AMPL: 1.125 mV
MDC IDC MSMT LEADCHNL RV SENSING INTR AMPL: 26.625 mV
MDC IDC MSMT LEADCHNL RV SENSING INTR AMPL: 26.625 mV
MDC IDC SESS DTM: 20190131043502
MDC IDC SET LEADCHNL RA PACING AMPLITUDE: 2 V
MDC IDC SET LEADCHNL RV PACING AMPLITUDE: 2.5 V
MDC IDC SET LEADCHNL RV PACING PULSEWIDTH: 0.4 ms
MDC IDC SET LEADCHNL RV SENSING SENSITIVITY: 5.6 mV
MDC IDC STAT BRADY AP VS PERCENT: 0 %
MDC IDC STAT BRADY RV PERCENT PACED: 99.99 %

## 2017-09-02 ENCOUNTER — Encounter: Payer: Self-pay | Admitting: Urgent Care

## 2017-09-02 ENCOUNTER — Other Ambulatory Visit: Payer: Self-pay

## 2017-09-02 ENCOUNTER — Ambulatory Visit: Payer: BC Managed Care – PPO | Admitting: Urgent Care

## 2017-09-02 VITALS — BP 117/69 | HR 88 | Temp 99.0°F | Resp 18 | Ht 70.0 in | Wt 149.2 lb

## 2017-09-02 DIAGNOSIS — J069 Acute upper respiratory infection, unspecified: Secondary | ICD-10-CM

## 2017-09-02 DIAGNOSIS — H9201 Otalgia, right ear: Secondary | ICD-10-CM

## 2017-09-02 DIAGNOSIS — B9789 Other viral agents as the cause of diseases classified elsewhere: Secondary | ICD-10-CM

## 2017-09-02 DIAGNOSIS — I421 Obstructive hypertrophic cardiomyopathy: Secondary | ICD-10-CM | POA: Diagnosis not present

## 2017-09-02 DIAGNOSIS — R070 Pain in throat: Secondary | ICD-10-CM | POA: Diagnosis not present

## 2017-09-02 DIAGNOSIS — Z950 Presence of cardiac pacemaker: Secondary | ICD-10-CM | POA: Diagnosis not present

## 2017-09-02 MED ORDER — BENZONATATE 100 MG PO CAPS: 100.0000 mg | ORAL_CAPSULE | Freq: Three times a day (TID) | ORAL | 0 refills | Status: DC | PRN

## 2017-09-02 MED ORDER — HYDROCOD POLST-CPM POLST ER 10-8 MG/5ML PO SUER: 5.0000 mL | Freq: Every evening | ORAL | 0 refills | Status: DC | PRN

## 2017-09-02 NOTE — Progress Notes (Signed)
  MRN: [id number] DOB: October 12, 1955  Subjective:   Rodney Adkins is a 62 y.o. male presenting for 3 day history of dry cough that is interrupting his sleep. Has also had chills, mild sore throat, right ear pain. Denies fever, chest pain, shob, wheezing. Denies smoking cigarettes. Denies history of asthma. Has HOCM, has been stable. Has pacemaker. Keeps follow up with his cardiologist, no significant findings recently.  Alric has a current medication list which includes the following prescription(s): vitamin c, cholecalciferol, dimethyl fumarate, and multivitamin. Also has No Known Allergies.  Dennard  has a past medical history of Complete heart block [facility]) (June 2009), Heart murmur, HOCM (hypertrophic obstructive cardiomyopathy) ([place]) (2009), Multiple sclerosis ([place]) (1998), Prostate enlargement, Right bundle branch block, SOB (shortness of breath), and Wenckebach. Also  has a past surgical history that includes Leg Tendon Surgery (2008); Pacemaker insertion (12/2007); left heart catheterization with coronary angiogram (N/A, 08/22/2013); pace maker; Cardiac catheterization (N/A, 02/12/2015); Pacemaker lead removal (Left, 07/18/2015); and TEE without cardioversion (N/A, 07/18/2015).  Objective:   Vitals: BP 117/69 (BP Location: Right Arm, Patient Position: Sitting, Cuff Size: Normal)   Pulse 88   Temp 99 F (37.2 C) (Oral)   Resp 18   Ht 5\' 10"  (1.778 m)   Wt 149 lb 3.2 oz (67.7 kg)   SpO2 95%   BMI 21.41 kg/m   Physical Exam  Constitutional: He is oriented to person, place, and time. He appears well-developed and well-nourished.  HENT:  TM's intact bilaterally, no effusions or erythema. Nasal turbinates pink, dry, nasal passages patent. No sinus tenderness. Oropharynx with mild post-nasal drainage, mucous membranes moist.    Eyes: Right eye exhibits no discharge. Left eye exhibits no discharge.  Cardiovascular: Normal rate, regular rhythm and intact distal pulses. Exam reveals no gallop and  no friction rub.  No murmur heard. Pulmonary/Chest: No respiratory distress. He has no wheezes. He has no rales.  Lymphadenopathy:    He has no cervical adenopathy.  Neurological: He is alert and oriented to person, place, and time.  Skin: Skin is warm and dry.  Psychiatric: He has a normal mood and affect.   Assessment and Plan :   Viral URI with cough  Throat pain  Right ear pain  HOCM (hypertrophic obstructive cardiomyopathy) ([place])  Pacemaker-dual-chamber Medtronic  Start supportive care for viral illness. Counseled patient on potential for adverse effects with medications prescribed today, patient verbalized understanding. Return-to-clinic precautions discussed, patient verbalized understanding.   Jaynee Eagles, PA-C Primary Care at [facility] [phone] 09/02/2017  12:01 PM

## 2017-09-02 NOTE — Patient Instructions (Addendum)
Viral Respiratory Infection A respiratory infection is an illness that affects part of the respiratory system, such as the lungs, nose, or throat. Most respiratory infections are caused by either viruses or bacteria. A respiratory infection that is caused by a virus is called a viral respiratory infection. Common types of viral respiratory infections include:  A cold.  The flu (influenza).  A respiratory syncytial virus (RSV) infection.  How do I know if I have a viral respiratory infection? Most viral respiratory infections cause:  A stuffy or runny nose.  Yellow or green nasal discharge.  A cough.  Sneezing.  Fatigue.  Achy muscles.  A sore throat.  Sweating or chills.  A fever.  A headache.  How are viral respiratory infections treated? If influenza is diagnosed early, it may be treated with an antiviral medicine that shortens the length of time a person has symptoms. Symptoms of viral respiratory infections may be treated with over-the-counter and prescription medicines, such as:  Expectorants. These make it easier to cough up mucus.  Decongestant nasal sprays.  Health care providers do not prescribe antibiotic medicines for viral infections. This is because antibiotics are designed to kill bacteria. They have no effect on viruses. How do I know if I should stay home from work or school? To avoid exposing others to your respiratory infection, stay home if you have:  A fever.  A persistent cough.  A sore throat.  A runny nose.  Sneezing.  Muscles aches.  Headaches.  Fatigue.  Weakness.  Chills.  Sweating.  Nausea.  Follow these instructions at home:  Rest as much as possible.  Take over-the-counter and prescription medicines only as told by your health care provider.  Drink enough fluid to keep your urine clear or pale yellow. This helps prevent dehydration and helps loosen up mucus.  Gargle with a salt-water mixture 3-4 times per day or  as needed. To make a salt-water mixture, completely dissolve -1 tsp of salt in 1 cup of warm water.  Use nose drops made from salt water to ease congestion and soften raw skin around your nose.  Do not drink alcohol.  Do not use tobacco products, including cigarettes, chewing tobacco, and e-cigarettes. If you need help quitting, ask your health care provider. Contact a health care provider if:  Your symptoms last for 10 days or longer.  Your symptoms get worse over time.  You have a fever.  You have severe sinus pain in your face or forehead.  The glands in your jaw or neck become very swollen. Get help right away if:  You feel pain or pressure in your chest.  You have shortness of breath.  You faint or feel like you will faint.  You have severe and persistent vomiting.  You feel confused or disoriented. This information is not intended to replace advice given to you by your health care provider. Make sure you discuss any questions you have with your health care provider. Document Released: 04/01/2005 Document Revised: 11/28/2015 Document Reviewed: 11/28/2014 Elsevier Interactive Patient Education  2018 [employer].     IF you received an x-ray today, you will receive an invoice from [facility]. Please contact [facility] at [phone] with questions or concerns regarding your invoice.   IF you received labwork today, you will receive an invoice from [facility]. Please contact [facility] at [phone] with questions or concerns regarding your invoice.   Our billing staff will not be able to assist you with questions regarding bills from  these companies.  You will be contacted with the lab results as soon as they are available. The fastest way to get your results is to activate your My Chart account. Instructions are located on the last page of this paperwork. If you have not heard from [place] regarding the results in 2 weeks, please contact this office.

## 2017-09-07 ENCOUNTER — Ambulatory Visit: Payer: BC Managed Care – PPO | Admitting: Family Medicine

## 2017-09-07 ENCOUNTER — Encounter: Payer: Self-pay | Admitting: Family Medicine

## 2017-09-07 VITALS — BP 126/72 | HR 82 | Temp 98.3°F | Resp 17 | Ht 70.0 in | Wt 148.0 lb

## 2017-09-07 DIAGNOSIS — R05 Cough: Secondary | ICD-10-CM | POA: Diagnosis not present

## 2017-09-07 DIAGNOSIS — J040 Acute laryngitis: Secondary | ICD-10-CM | POA: Diagnosis not present

## 2017-09-07 DIAGNOSIS — R059 Cough, unspecified: Secondary | ICD-10-CM

## 2017-09-07 MED ORDER — BENZONATATE 100 MG PO CAPS: 100.0000 mg | ORAL_CAPSULE | Freq: Three times a day (TID) | ORAL | 0 refills | Status: DC | PRN

## 2017-09-07 MED ORDER — DOXYCYCLINE HYCLATE 100 MG PO TABS: 100.0000 mg | ORAL_TABLET | Freq: Two times a day (BID) | ORAL | 0 refills | Status: DC

## 2017-09-07 NOTE — Progress Notes (Signed)
Subjective:  By signing my name below, I, Essence Howell, attest that this documentation has been prepared under the direction and in the presence of [name], MD Electronically Signed: Ladene Artist, ED Scribe 09/07/2017 at 5:54 PM.   Patient ID: Rodney Adkins, male    DOB: Nov 16, 1955, 62 y.o.   MRN: [id number]  Chief Complaint  Patient presents with  . URI   HPI Rodney Adkins is a 62 y.o. male who presents to Primary Care at [facility] with URI symptoms. H/o multiple chronic medical problems including MS, complete heart block, HOCM. Seen 2/28 with a 3 days h/o dry cough, chills, sore throat, R ear pain. No fever at that time, temp 99 F. Suspected viral illness. Treated with symptomatic care including tussionex if needed at bedtime.  Today, pt reports hoarseness x 2 days, chills, decreased appetite, nasal congestion with bloody mucus, mild sore throat, mild HA, generalized body aches, gradually worsening dry cough and elevated temperature from his baseline x 1 wk, improved today but never febrile. Pt states that cough is worse with lying down. He has tried DMs without relief, tussionex with very minimal relief, humidifier, throat lozenges and saline nasal spray. Denies sob. Pt has also taken a quarter of alprazolam to assist with sleep. He has been off of tecfidera for a while.  Patient Active Problem List   Diagnosis Date Noted  . BPH (benign prostatic hyperplasia) 02/02/2016  . Fractured atrial pacemaker lead wire 07/18/2015  . 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 Chest pain 08/02/2013  . SOB (shortness of breath) 08/02/2013  . PVC (premature ventricular contraction) 02/13/2013  . Chest pain on exertion 02/25/2012  . Pacemaker-dual-chamber Medtronic 04/29/2011  . Change in bowel habits 01/22/2011  . Weight loss 01/22/2011  . COUGH 09/01/2010  . Multiple sclerosis ([place])   . HOCM (hypertrophic obstructive cardiomyopathy) ([place])   . Right bundle branch block   . Complete  heart block ([place]) 12/05/2007   Past Medical History:  Diagnosis Date  . Complete heart block [facility]) June 2009   a. Presented with hypotension/pulm edema 12/2007 when HOCM was also discovered. s/p dual chamber MDT pacemaker. Thallium stest 7/09 negative for infiltrative disease or ischemia.  Followed Dr [name]  Marland Kitchen Heart murmur   . HOCM (hypertrophic obstructive cardiomyopathy) ([place]) 2009   Discovered by ECHO when he developed CHB. Mgmt Dr Caryl Comes  and Dr [name] at [facility]. Has outflow obstruction on ECHO 2011  . Multiple sclerosis ([place]) 1998   Sxs started 1998 with L body numbness. MRI c/w MS. Started Avonex. Saw Dr [name] at [facility] and switched to Betaseron and was on for 5 yrs but developed depression and site rxns and D/C'd 2006. While on Betaseron, occassionally required solumedrol for flares.  . Prostate enlargement   . Right bundle branch block   . SOB (shortness of breath)    a. PFTs 09/2010 essentially normal per pulm note (mild obst defect). b. 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  . 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 Pacer reprogrammed with improvement in sx.   Past Surgical History:  Procedure Laterality Date  . LEFT HEART CATHETERIZATION WITH CORONARY ANGIOGRAM N/A 08/22/2013   Procedure: LEFT HEART CATHETERIZATION WITH CORONARY ANGIOGRAM;  Surgeon: [name], MD;  Location: [facility] CATH LAB;  Service: Cardiovascular;  Laterality: N/A;  .  LEG TENDON SURGERY  2008   Os peroneus resection and peroneal tendon repair  . pace Agricultural engineer    . PACEMAKER INSERTION  12/2007   Complete heart block  . PACEMAKER LEAD REMOVAL Left 07/18/2015   Procedure: ATRIAL PACEMAKER LEAD EXTRACTION; ATRIAL PACEMAKER LEAD INSERTION; GENERATOR REPLACEMENT.;  Surgeon: [name], MD;  Location: [place];  Service: Cardiovascular;  Laterality: Left;   [name] to back up  . PERIPHERAL VASCULAR CATHETERIZATION N/A 02/12/2015   Procedure: Venogram;  Surgeon: [name], MD;  Location: [place] CV LAB;  Service: Cardiovascular;  Laterality: N/A;  . TEE WITHOUT CARDIOVERSION N/A 07/18/2015   Procedure: TRANSESOPHAGEAL ECHOCARDIOGRAM (TEE);  Surgeon: [name], MD;  Location: [facility] OR;  Service: Cardiovascular;  Laterality: N/A;   No Known Allergies Prior to Admission medications   Medication Sig Start Date End Date Taking? Authorizing Provider  Ascorbic Acid (VITAMIN C) 500 MG tablet Take 500 mg by mouth daily as needed (Vitamin c supplement).    Yes [provider]  chlorpheniramine-HYDROcodone (TUSSIONEX PENNKINETIC ER) 10-8 MG/5ML SUER Take 5 mLs by mouth at bedtime as needed. 09/02/17  Yes Jaynee Eagles, PA-C  cholecalciferol (VITAMIN D) 1000 UNITS tablet Take 1,000 Units by mouth daily. Take 5,000 units daily   Yes [provider]  Dimethyl Fumarate (TECFIDERA) 240 MG CPDR Take 240 mg by mouth 2 (two) times daily.   Yes [provider]  Multiple Vitamin (MULTIVITAMIN) tablet Take 1 tablet by mouth daily.    Yes [provider]   Social History   Socioeconomic History  . Marital status: Married    Spouse name: Not on file  . Number of children: 0  . Years of education: Not on file  . Highest education level: Not on file  Social Needs  . Financial resource strain: Not on file  . Food insecurity - worry: Not on file  . Food insecurity - inability: Not on file  . Transportation needs - medical: Not on file  . Transportation needs - non-medical: Not on file  Occupational History  . Occupation: professor    Employer: [employer]    Comment: Statistics  Tobacco Use  . Smoking status: Never Smoker  . Smokeless tobacco: Never Used  Substance and Sexual Activity  . Alcohol use: No    Alcohol/week: 0.0 oz  . Drug use: No  . Sexual activity: No  Other Topics Concern  . 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   Review of Systems  Constitutional: Positive for appetite change, chills and fever (low-grade, resolved).  HENT: Positive for congestion, sore throat (mild) and voice change.   Respiratory: Positive for cough. Negative for shortness of breath.   Musculoskeletal: Positive for myalgias (mild).  Neurological: Positive for headaches (mild).      Objective:   Physical Exam  Constitutional: He is oriented to person, place, and time. He appears well-developed and well-nourished.  HENT:  Head: Normocephalic and atraumatic.  Right Ear: Tympanic membrane, external ear and ear canal normal.  Left Ear: Tympanic membrane, external ear and ear canal normal.  Nose: No rhinorrhea.  Mouth/Throat: Mucous membranes are normal. Posterior oropharyngeal erythema (slight) present. No oropharyngeal exudate.  Nose: Dried blood along the septum bilaterally.  Eyes: Conjunctivae are normal. Pupils are equal, round, and reactive to light.  Neck: Neck supple.  Cardiovascular: Normal rate, regular rhythm, normal heart sounds and intact distal pulses.  No murmur heard. Pulmonary/Chest: Effort normal  and breath sounds normal. He has no wheezes. He has no rhonchi. He has no rales.  Lungs are clear.  Abdominal: Soft. There is no tenderness.  Lymphadenopathy:    He has no cervical adenopathy.  Neurological: He is alert and oriented to person, place, and time.  Skin: Skin is warm and dry. No rash noted.  Psychiatric: He has a normal mood and affect. His behavior is normal.  Vitals reviewed.  Vitals:   09/07/17 1736  BP: 126/72  Pulse: 82  Resp: 17  Temp: 98.3 F (36.8 C)  TempSrc: Oral  SpO2: 98%  Weight: 148 lb (67.1 kg)  Height: 5\' 10"  (1.778 m)      Assessment & Plan:   Rodney Adkins is a 62 y.o. male Cough - Plan: benzonatate (TESSALON) 100 MG capsule, doxycycline (VIBRA-TABS) 100 MG tablet  Laryngitis  - Suspected initial viral illness  along with current laryngitis, but persistent cough with some possible recent worsening. Possible secondary lower respiratory tract infection/bronchitis. Lungs clear, afebrile at present  -Tessalon, voice rest, fluids discussed symptomatic care for current cough and laryngitis.  -Has Tussionex at home, can increase to 7.5 up to 10 mL daily at bedtime when necessary. Potential side effects and fall risks discussed  -If cough is not improving in next few days, start doxycycline, but RTC/ER precautions if worsening  Meds ordered this encounter  Medications  . benzonatate (TESSALON) 100 MG capsule    Sig: Take 1 capsule (100 mg total) by mouth 3 (three) times daily as needed for cough.    Dispense:  20 capsule    Refill:  0  . doxycycline (VIBRA-TABS) 100 MG tablet    Sig: Take 1 tablet (100 mg total) by mouth 2 (two) times daily.    Dispense:  20 tablet    Refill:  0   Patient Instructions    Tessalon for cough during the day. Drink plenty of fluids and  voice rest for laryngitis. 5 to 10 ml (can try 7.65ml initially) of cough syrup at night.  Be careful with falling or dizziness at higher doses.   If cough not improving in the next few days, can start antibiotic that was printed. If you do have any worsening symptoms including fever or shortness of breath, please return for recheck.  Laryngitis Laryngitis is inflammation of your vocal cords. This causes hoarseness, coughing, loss of voice, sore throat, or a dry throat. Your vocal cords are two bands of muscles that are found in your throat. When you speak, these cords come together and vibrate. These vibrations come out through your mouth as sound. When your vocal cords are inflamed, your voice sounds different. Laryngitis can be temporary (acute) or long-term (chronic). Most cases of acute laryngitis improve with time. Chronic laryngitis is laryngitis that lasts for more than three weeks. What are the causes? Acute laryngitis may be caused  by:  A viral infection.  Lots of talking, yelling, or singing. This is also called vocal strain.  Bacterial infections.  Chronic laryngitis may be caused by:  Vocal strain.  Injury to your vocal cords.  Acid reflux (gastroesophageal reflux disease or GERD).  Allergies.  Sinus infection.  Smoking.  Alcohol abuse.  Breathing in chemicals or dust.  Growths on the vocal cords.  What increases the risk? Risk factors for laryngitis include:  Smoking.  Alcohol abuse.  Having allergies.  What are the signs or symptoms? Symptoms of laryngitis may include:  Low, hoarse voice.  Loss of  voice.  Dry cough.  Sore throat.  Stuffy nose.  How is this diagnosed? Laryngitis may be diagnosed by:  Physical exam.  Throat culture.  Blood test.  Laryngoscopy. This procedure allows your health care provider to look at your vocal cords with a mirror or viewing tube.  How is this treated? Treatment for laryngitis depends on what is causing it. Usually, treatment involves resting your voice and using medicines to soothe your throat. However, if your laryngitis is caused by a bacterial infection, you may need to take antibiotic medicine. If your laryngitis is caused by a growth, you may need to have a procedure to remove it. Follow these instructions at home:  Drink enough fluid to keep your urine clear or pale yellow.  Breathe in moist air. Use a humidifier if you live in a dry climate.  Take medicines only as directed by your health care provider.  If you were prescribed an antibiotic medicine, finish it all even if you start to feel better.  Do not smoke cigarettes or electronic cigarettes. If you need help quitting, ask your health care provider.  Talk as little as possible. Also avoid whispering, which can cause vocal strain.  Write instead of talking. Do this until your voice is back to normal. Contact a health care provider if:  You have a fever.  You have  increasing pain.  You have difficulty swallowing. Get help right away if:  You cough up blood.  You have trouble breathing. This information is not intended to replace advice given to you by your health care provider. Make sure you discuss any questions you have with your health care provider. Document Released: 06/22/2005 Document Revised: 11/28/2015 Document Reviewed: 12/05/2013 Elsevier Interactive Patient Education  2018 [employer].   Cough, Adult Coughing is a reflex that clears your throat and your airways. Coughing helps to heal and protect your lungs. It is normal to cough occasionally, but a cough that happens with other symptoms or lasts a long time may be a sign of a condition that needs treatment. A cough may last only 2-3 weeks (acute), or it may last longer than 8 weeks (chronic). What are the causes? Coughing is commonly caused by:  Breathing in substances that irritate your lungs.  A viral or bacterial respiratory infection.  Allergies.  Asthma.  Postnasal drip.  Smoking.  Acid backing up from the stomach into the esophagus (gastroesophageal reflux).  Certain medicines.  Chronic lung problems, including COPD (or rarely, lung cancer).  Other medical conditions such as heart failure.  Follow these instructions at home: Pay attention to any changes in your symptoms. Take these actions to help with your discomfort:  Take medicines only as told by your health care provider. ? If you were prescribed an antibiotic medicine, take it as told by your health care provider. Do not stop taking the antibiotic even if you start to feel better. ? Talk with your health care provider before you take a cough suppressant medicine.  Drink enough fluid to keep your urine clear or pale yellow.  If the air is dry, use a cold steam vaporizer or humidifier in your bedroom or your home to help loosen secretions.  Avoid anything that causes you to cough at work or at  home.  If your cough is worse at night, try sleeping in a semi-upright position.  Avoid cigarette smoke. If you smoke, quit smoking. If you need help quitting, ask your health care provider.  Avoid caffeine.  Avoid alcohol.  Rest as needed.  Contact a health care provider if:  You have new symptoms.  You cough up pus.  Your cough does not get better after 2-3 weeks, or your cough gets worse.  You cannot control your cough with suppressant medicines and you are losing sleep.  You develop pain that is getting worse or pain that is not controlled with pain medicines.  You have a fever.  You have unexplained weight loss.  You have night sweats. Get help right away if:  You cough up blood.  You have difficulty breathing.  Your heartbeat is very fast. This information is not intended to replace advice given to you by your health care provider. Make sure you discuss any questions you have with your health care provider. Document Released: 12/19/2010 Document Revised: 11/28/2015 Document Reviewed: 08/29/2014 Elsevier Interactive Patient Education  2018 [employer].   IF you received an x-ray today, you will receive an invoice from [facility]. Please contact [facility] at [phone] with questions or concerns regarding your invoice.   IF you received labwork today, you will receive an invoice from [facility]. Please contact [facility] at [phone] with questions or concerns regarding your invoice.   Our billing staff will not be able to assist you with questions regarding bills from these companies.  You will be contacted with the lab results as soon as they are available. The fastest way to get your results is to activate your My Chart account. Instructions are located on the last page of this paperwork. If you have not heard from [place] regarding the results in 2 weeks, please contact this office.       I personally performed the services described in  this documentation, which was scribed in my presence. The recorded information has been reviewed and considered for accuracy and completeness, addended by me as needed, and agree with information above.  Signed,   [name], MD Primary [facility] at [place].  09/10/17 3:15 PM

## 2017-09-07 NOTE — Patient Instructions (Addendum)
Tessalon for cough during the day. Drink plenty of fluids and  voice rest for laryngitis. 5 to 10 ml (can try 7.66ml initially) of cough syrup at night.  Be careful with falling or dizziness at higher doses.   If cough not improving in the next few days, can start antibiotic that was printed. If you do have any worsening symptoms including fever or shortness of breath, please return for recheck.  Laryngitis Laryngitis is inflammation of your vocal cords. This causes hoarseness, coughing, loss of voice, sore throat, or a dry throat. Your vocal cords are two bands of muscles that are found in your throat. When you speak, these cords come together and vibrate. These vibrations come out through your mouth as sound. When your vocal cords are inflamed, your voice sounds different. Laryngitis can be temporary (acute) or long-term (chronic). Most cases of acute laryngitis improve with time. Chronic laryngitis is laryngitis that lasts for more than three weeks. What are the causes? Acute laryngitis may be caused by:  A viral infection.  Lots of talking, yelling, or singing. This is also called vocal strain.  Bacterial infections.  Chronic laryngitis may be caused by:  Vocal strain.  Injury to your vocal cords.  Acid reflux (gastroesophageal reflux disease or GERD).  Allergies.  Sinus infection.  Smoking.  Alcohol abuse.  Breathing in chemicals or dust.  Growths on the vocal cords.  What increases the risk? Risk factors for laryngitis include:  Smoking.  Alcohol abuse.  Having allergies.  What are the signs or symptoms? Symptoms of laryngitis may include:  Low, hoarse voice.  Loss of voice.  Dry cough.  Sore throat.  Stuffy nose.  How is this diagnosed? Laryngitis may be diagnosed by:  Physical exam.  Throat culture.  Blood test.  Laryngoscopy. This procedure allows your health care provider to look at your vocal cords with a mirror or viewing tube.  How is  this treated? Treatment for laryngitis depends on what is causing it. Usually, treatment involves resting your voice and using medicines to soothe your throat. However, if your laryngitis is caused by a bacterial infection, you may need to take antibiotic medicine. If your laryngitis is caused by a growth, you may need to have a procedure to remove it. Follow these instructions at home:  Drink enough fluid to keep your urine clear or pale yellow.  Breathe in moist air. Use a humidifier if you live in a dry climate.  Take medicines only as directed by your health care provider.  If you were prescribed an antibiotic medicine, finish it all even if you start to feel better.  Do not smoke cigarettes or electronic cigarettes. If you need help quitting, ask your health care provider.  Talk as little as possible. Also avoid whispering, which can cause vocal strain.  Write instead of talking. Do this until your voice is back to normal. Contact a health care provider if:  You have a fever.  You have increasing pain.  You have difficulty swallowing. Get help right away if:  You cough up blood.  You have trouble breathing. This information is not intended to replace advice given to you by your health care provider. Make sure you discuss any questions you have with your health care provider. Document Released: 06/22/2005 Document Revised: 11/28/2015 Document Reviewed: 12/05/2013 Elsevier Interactive Patient Education  2018 [employer].   Cough, Adult Coughing is a reflex that clears your throat and your airways. Coughing helps to heal and protect  your lungs. It is normal to cough occasionally, but a cough that happens with other symptoms or lasts a long time may be a sign of a condition that needs treatment. A cough may last only 2-3 weeks (acute), or it may last longer than 8 weeks (chronic). What are the causes? Coughing is commonly caused by:  Breathing in substances that irritate  your lungs.  A viral or bacterial respiratory infection.  Allergies.  Asthma.  Postnasal drip.  Smoking.  Acid backing up from the stomach into the esophagus (gastroesophageal reflux).  Certain medicines.  Chronic lung problems, including COPD (or rarely, lung cancer).  Other medical conditions such as heart failure.  Follow these instructions at home: Pay attention to any changes in your symptoms. Take these actions to help with your discomfort:  Take medicines only as told by your health care provider. ? If you were prescribed an antibiotic medicine, take it as told by your health care provider. Do not stop taking the antibiotic even if you start to feel better. ? Talk with your health care provider before you take a cough suppressant medicine.  Drink enough fluid to keep your urine clear or pale yellow.  If the air is dry, use a cold steam vaporizer or humidifier in your bedroom or your home to help loosen secretions.  Avoid anything that causes you to cough at work or at home.  If your cough is worse at night, try sleeping in a semi-upright position.  Avoid cigarette smoke. If you smoke, quit smoking. If you need help quitting, ask your health care provider.  Avoid caffeine.  Avoid alcohol.  Rest as needed.  Contact a health care provider if:  You have new symptoms.  You cough up pus.  Your cough does not get better after 2-3 weeks, or your cough gets worse.  You cannot control your cough with suppressant medicines and you are losing sleep.  You develop pain that is getting worse or pain that is not controlled with pain medicines.  You have a fever.  You have unexplained weight loss.  You have night sweats. Get help right away if:  You cough up blood.  You have difficulty breathing.  Your heartbeat is very fast. This information is not intended to replace advice given to you by your health care provider. Make sure you discuss any questions you  have with your health care provider. Document Released: 12/19/2010 Document Revised: 11/28/2015 Document Reviewed: 08/29/2014 Elsevier Interactive Patient Education  2018 [employer].   IF you received an x-ray today, you will receive an invoice from [facility]. Please contact [facility] at [phone] with questions or concerns regarding your invoice.   IF you received labwork today, you will receive an invoice from [facility]. Please contact [facility] at [phone] with questions or concerns regarding your invoice.   Our billing staff will not be able to assist you with questions regarding bills from these companies.  You will be contacted with the lab results as soon as they are available. The fastest way to get your results is to activate your My Chart account. Instructions are located on the last page of this paperwork. If you have not heard from [place] regarding the results in 2 weeks, please contact this office.

## 2017-09-08 ENCOUNTER — Ambulatory Visit: Payer: BC Managed Care – PPO | Admitting: Emergency Medicine

## 2017-11-01 ENCOUNTER — Telehealth: Payer: Self-pay | Admitting: Cardiology

## 2017-11-01 ENCOUNTER — Encounter: Payer: BC Managed Care – PPO | Admitting: *Deleted

## 2017-11-01 NOTE — Telephone Encounter (Signed)
LMOVM reminding pt to send remote transmission.   

## 2017-11-04 ENCOUNTER — Encounter: Payer: Self-pay | Admitting: Cardiology

## 2017-11-05 ENCOUNTER — Ambulatory Visit (INDEPENDENT_AMBULATORY_CARE_PROVIDER_SITE_OTHER): Payer: BC Managed Care – PPO | Admitting: *Deleted

## 2017-11-05 DIAGNOSIS — I442 Atrioventricular block, complete: Secondary | ICD-10-CM | POA: Diagnosis not present

## 2017-11-08 NOTE — Progress Notes (Signed)
Remote pacemaker transmission.   

## 2017-11-09 ENCOUNTER — Encounter: Payer: Self-pay | Admitting: Cardiology

## 2017-11-15 LAB — CUP PACEART REMOTE DEVICE CHECK
Brady Statistic AP VS Percent: 0 %
Brady Statistic AS VP Percent: 87.11 %
Brady Statistic AS VS Percent: 0 %
Date Time Interrogation Session: 20190503201332
Implantable Lead Implant Date: 20090608
Implantable Lead Implant Date: 20170112
Implantable Lead Location: 753860
Lead Channel Impedance Value: 437 Ohm
Lead Channel Impedance Value: 437 Ohm
Lead Channel Impedance Value: 513 Ohm
Lead Channel Pacing Threshold Amplitude: 0.625 V
Lead Channel Pacing Threshold Amplitude: 1.125 V
Lead Channel Pacing Threshold Pulse Width: 0.4 ms
Lead Channel Pacing Threshold Pulse Width: 0.4 ms
Lead Channel Sensing Intrinsic Amplitude: 1.5 mV
Lead Channel Setting Sensing Sensitivity: 5.6 mV
MDC IDC LEAD LOCATION: 753859
MDC IDC MSMT BATTERY REMAINING LONGEVITY: 88 mo
MDC IDC MSMT BATTERY VOLTAGE: 3.01 V
MDC IDC MSMT LEADCHNL RA IMPEDANCE VALUE: 361 Ohm
MDC IDC MSMT LEADCHNL RA SENSING INTR AMPL: 1.5 mV
MDC IDC MSMT LEADCHNL RV SENSING INTR AMPL: 26.625 mV
MDC IDC MSMT LEADCHNL RV SENSING INTR AMPL: 26.625 mV
MDC IDC PG IMPLANT DT: 20170112
MDC IDC SET LEADCHNL RA PACING AMPLITUDE: 2 V
MDC IDC SET LEADCHNL RV PACING AMPLITUDE: 2.5 V
MDC IDC SET LEADCHNL RV PACING PULSEWIDTH: 0.4 ms
MDC IDC STAT BRADY AP VP PERCENT: 12.89 %
MDC IDC STAT BRADY RA PERCENT PACED: 12.89 %
MDC IDC STAT BRADY RV PERCENT PACED: 99.99 %

## 2017-11-30 ENCOUNTER — Encounter: Payer: Self-pay | Admitting: Family Medicine

## 2018-02-07 ENCOUNTER — Ambulatory Visit (INDEPENDENT_AMBULATORY_CARE_PROVIDER_SITE_OTHER): Payer: BC Managed Care – PPO | Admitting: *Deleted

## 2018-02-07 DIAGNOSIS — I442 Atrioventricular block, complete: Secondary | ICD-10-CM | POA: Diagnosis not present

## 2018-02-09 ENCOUNTER — Telehealth: Payer: Self-pay

## 2018-02-09 NOTE — Progress Notes (Signed)
Remote pacemaker transmission.   

## 2018-02-09 NOTE — Telephone Encounter (Signed)
I spoke with the patient about this.

## 2018-02-12 ENCOUNTER — Ambulatory Visit (INDEPENDENT_AMBULATORY_CARE_PROVIDER_SITE_OTHER): Payer: BC Managed Care – PPO | Admitting: Emergency Medicine

## 2018-02-12 ENCOUNTER — Other Ambulatory Visit: Payer: Self-pay

## 2018-02-12 ENCOUNTER — Encounter: Payer: Self-pay | Admitting: Emergency Medicine

## 2018-02-12 VITALS — BP 118/65 | HR 69 | Temp 98.1°F | Resp 20 | Ht 69.76 in | Wt 147.2 lb

## 2018-02-12 DIAGNOSIS — H1033 Unspecified acute conjunctivitis, bilateral: Secondary | ICD-10-CM | POA: Diagnosis not present

## 2018-02-12 MED ORDER — POLYMYXIN B-TRIMETHOPRIM 10000-0.1 UNIT/ML-% OP SOLN: 2.0000 [drp] | Freq: Four times a day (QID) | OPHTHALMIC | 1 refills | Status: AC

## 2018-02-12 NOTE — Progress Notes (Signed)
Rodney Adkins 62 y.o.   Chief Complaint  Patient presents with  . Eye Problem    X 2 days- both eyes    HISTORY OF PRESENT ILLNESS: This is a 61 y.o. male complaining of red eyes bilaterally for 2 days.  May be allergic may be infectious conjunctivitis.  Denies trauma or visual problems.  Denies discharge.  Denies flulike symptoms.  No contact lens use.  Eyes feel very irritated.  HPI   Prior to Admission medications   Medication Sig Start Date End Date Taking? Authorizing Provider  Ascorbic Acid (VITAMIN C) 500 MG tablet Take 500 mg by mouth daily as needed (Vitamin c supplement).    Yes [provider]  cholecalciferol (VITAMIN D) 1000 UNITS tablet Take 1,000 Units by mouth daily. Take 5,000 units daily   Yes [provider]  Dimethyl Fumarate (TECFIDERA) 240 MG CPDR Take 240 mg by mouth 2 (two) times daily.   Yes [provider]  Multiple Vitamin (MULTIVITAMIN) tablet Take 1 tablet by mouth daily.    Yes [provider]  benzonatate (TESSALON) 100 MG capsule Take 1 capsule (100 mg total) by mouth 3 (three) times daily as needed for cough. Patient not taking: Reported on 02/12/2018 09/07/17   [name], MD  chlorpheniramine-HYDROcodone [facility] PENNKINETIC ER) 10-8 MG/5ML SUER Take 5 mLs by mouth at bedtime as needed. Patient not taking: Reported on 02/12/2018 09/02/17   Jaynee Eagles, PA-C  doxycycline (VIBRA-TABS) 100 MG tablet Take 1 tablet (100 mg total) by mouth 2 (two) times daily. Patient not taking: Reported on 02/12/2018 09/07/17   [name], MD    No Known Allergies  Patient Active Problem List   Diagnosis Date Noted  . BPH (benign prostatic hyperplasia) 02/02/2016  . Fractured atrial pacemaker lead wire 07/18/2015  . 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 Chest pain 08/02/2013  . SOB (shortness of breath) 08/02/2013  . PVC (premature ventricular contraction) 02/13/2013  . Chest pain on exertion 02/25/2012  .  Pacemaker-dual-chamber Medtronic 04/29/2011  . Change in bowel habits 01/22/2011  . Weight loss 01/22/2011  . COUGH 09/01/2010  . Multiple sclerosis ([place])   . HOCM (hypertrophic obstructive cardiomyopathy) ([place])   . Right bundle branch block   . Complete heart block ([place]) 12/05/2007    Past Medical History:  Diagnosis Date  . Complete heart block [facility]) June 2009   a. Presented with hypotension/pulm edema 12/2007 when HOCM was also discovered. s/p dual chamber MDT pacemaker. Thallium stest 7/09 negative for infiltrative disease or ischemia.  Followed Dr [name]  Marland Kitchen Heart murmur   . HOCM (hypertrophic obstructive cardiomyopathy) ([place]) 2009   Discovered by ECHO when he developed CHB. Mgmt Dr Caryl Comes  and Dr [name] at [facility]. Has outflow obstruction on ECHO 2011  . Multiple sclerosis ([place]) 1998   Sxs started 1998 with L body numbness. MRI c/w MS. Started Avonex. Saw Dr [name] at [facility] and switched to Betaseron and was on for 5 yrs but developed depression and site rxns and D/C'd 2006. While on Betaseron, occassionally required solumedrol for flares.  . Prostate enlargement   . Right bundle branch block   . SOB (shortness of breath)    a. PFTs 09/2010 essentially normal per pulm note (mild obst defect). b. 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  . Wenckebach    a. Decreased ex tolerance 2010/2012 - underwent stress echo to look @ effects of MV/LVOT - nothing noted; although pt had high rate Wenckebach  behavior with associated ex intol. Pacer reprogrammed with improvement in sx.    Past Surgical History:  Procedure Laterality Date  . LEFT HEART CATHETERIZATION WITH CORONARY ANGIOGRAM N/A 08/22/2013   Procedure: LEFT HEART CATHETERIZATION WITH CORONARY ANGIOGRAM;  Surgeon: [name], MD;  Location: [facility] CATH LAB;  Service: Cardiovascular;  Laterality: N/A;  . LEG TENDON SURGERY  2008   Os peroneus resection and peroneal tendon repair  . pace Agricultural engineer    . PACEMAKER INSERTION  12/2007    Complete heart block  . PACEMAKER LEAD REMOVAL Left 07/18/2015   Procedure: ATRIAL PACEMAKER LEAD EXTRACTION; ATRIAL PACEMAKER LEAD INSERTION; GENERATOR REPLACEMENT.;  Surgeon: [name], MD;  Location: [place];  Service: Cardiovascular;  Laterality: Left;  [name] to back up  . PERIPHERAL VASCULAR CATHETERIZATION N/A 02/12/2015   Procedure: Venogram;  Surgeon: [name], MD;  Location: [place] CV LAB;  Service: Cardiovascular;  Laterality: N/A;  . TEE WITHOUT CARDIOVERSION N/A 07/18/2015   Procedure: TRANSESOPHAGEAL ECHOCARDIOGRAM (TEE);  Surgeon: [name], MD;  Location: [facility] OR;  Service: Cardiovascular;  Laterality: N/A;    Social History   Socioeconomic History  . Marital status: Married    Spouse name: Not on file  . Number of children: 0  . Years of education: Not on file  . Highest education level: Not on file  Occupational History  . Occupation: professor    Fish farm manager: [employer]    Comment: [employer]  Social Needs  . Financial resource strain: Not on file  . Food insecurity:    Worry: Not on file    Inability: Not on file  . Transportation needs:    Medical: Not on file    Non-medical: Not on file  Tobacco Use  . Smoking status: Never Smoker  . Smokeless tobacco: Never Used  Substance and Sexual Activity  . Alcohol use: No    Alcohol/week: 0.0 standard drinks  . Drug use: No  . Sexual activity: Not Currently  Lifestyle  . Physical activity:    Days per week: Not on file    Minutes per session: Not on file  . Stress: Not on file  Relationships  . Social connections:    Talks on phone: Not on file    Gets together: Not on file    Attends religious service: Not on file    Active member of club or organization: Not on file    Attends meetings of clubs or organizations: Not on file    Relationship status: Not on file  . Intimate partner violence:    Fear of current or ex partner: Not on file    Emotionally abused: Not on file    Physically  abused: Not on file    Forced sexual activity: Not on file  Other Topics Concern  . 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    Family History  Problem Relation Age of Onset  . Colon polyps Father   . Hypertension Father   . Diabetes Paternal Grandmother   . Heart disease Neg Hx      Review of Systems  Constitutional: Negative.  Negative for chills and fever.  HENT: Negative.  Negative for congestion and sore throat.   Eyes: Positive for redness. Negative for blurred vision, double vision, photophobia, pain and discharge.  Respiratory: Negative.  Negative for cough.   Cardiovascular: Negative.   Gastrointestinal: Negative.  Negative for nausea and vomiting.  Skin:  Negative.  Negative for rash.  Neurological: Negative.   Endo/Heme/Allergies: Negative.     Vitals:   02/12/18 1327  BP: 118/65  Pulse: 69  Resp: 20  Temp: 98.1 F (36.7 C)  SpO2: 95%    Physical Exam  Constitutional: He is oriented to person, place, and time. He appears well-developed and well-nourished.  HENT:  Head: Normocephalic.  Eyes: Pupils are equal, round, and reactive to light. EOM are normal. Right conjunctiva is injected. Left conjunctiva is injected.  Neck: Normal range of motion. Neck supple.  Cardiovascular: Normal rate.  Pulmonary/Chest: Effort normal.  Musculoskeletal: Normal range of motion.  Neurological: He is alert and oriented to person, place, and time.  Skin: Skin is warm and dry. Capillary refill takes less than 2 seconds.  Psychiatric: He has a normal mood and affect. His behavior is normal.  Vitals reviewed.    ASSESSMENT & PLAN: Kham was seen today for eye problem.  Diagnoses and all orders for this visit:  Acute conjunctivitis of both eyes, unspecified acute conjunctivitis type -     trimethoprim-polymyxin b (POLYTRIM) ophthalmic solution; Place 2 drops into both eyes every 6 (six) hours for 5 days.    Patient  Instructions       IF you received an x-ray today, you will receive an invoice from [facility]. Please contact [facility] at [phone] with questions or concerns regarding your invoice.   IF you received labwork today, you will receive an invoice from [facility]. Please contact [facility] at [phone] with questions or concerns regarding your invoice.   Our billing staff will not be able to assist you with questions regarding bills from these companies.  You will be contacted with the lab results as soon as they are available. The fastest way to get your results is to activate your My Chart account. Instructions are located on the last page of this paperwork. If you have not heard from [place] regarding the results in 2 weeks, please contact this office.     Bacterial Conjunctivitis Bacterial conjunctivitis is an infection of the clear membrane that covers the white part of your eye and the inner surface of your eyelid (conjunctiva). When the blood vessels in your conjunctiva become inflamed, your eye becomes red or pink, and it will probably feel itchy. Bacterial conjunctivitis spreads very easily from person to person (is contagious). It also spreads easily from one eye to the other eye. What are the causes? This condition is caused by several common bacteria. You may get the infection if you come into close contact with another person who is infected. You may also come into contact with items that are contaminated with the bacteria, such as a face towel, contact lens solution, or eye makeup. What increases the risk? This condition is more likely to develop in people who:  Are exposed to other people who have the infection.  Wear contact lenses.  Have a sinus infection.  Have had a recent eye injury or surgery.  Have a weak body defense system (immune system).  Have a medical condition that causes dry eyes.  What are the signs or symptoms? Symptoms of this  condition include:  Eye redness.  Tearing or watery eyes.  Itchy eyes.  Burning feeling in your eyes.  Thick, yellowish discharge from an eye. This may turn into a crust on the eyelid overnight and cause your eyelids to stick together.  Swollen eyelids.  Blurred vision.  How is this diagnosed? Your health care provider  can diagnose this condition based on your symptoms and medical history. Your health care provider may also take a sample of discharge from your eye to find the cause of your infection. This is rarely done. How is this treated? Treatment for this condition includes:  Antibiotic eye drops or ointment to clear the infection more quickly and prevent the spread of infection to others.  Oral antibiotic medicines to treat infections that do not respond to drops or ointments, or last longer than 10 days.  Cool, wet cloths (cool compresses) placed on the eyes.  Artificial tears applied 2-6 times a day.  Follow these instructions at home: Medicines  Take or apply your antibiotic medicine as told by your health care provider. Do not stop taking or applying the antibiotic even if you start to feel better.  Take or apply over-the-counter and prescription medicines only as told by your health care provider.  Be very careful to avoid touching the edge of your eyelid with the eye drop bottle or the ointment tube when you apply medicines to the affected eye. This will keep you from spreading the infection to your other eye or to other people. Managing discomfort  Gently wipe away any drainage from your eye with a warm, wet washcloth or a cotton ball.  Apply a cool, clean washcloth to your eye for 10-20 minutes, 3-4 times a day. General instructions  Do not wear contact lenses until the inflammation is gone and your health care provider says it is safe to wear them again. Ask your health care provider how to sterilize or replace your contact lenses before you use them again.  Wear glasses until you can resume wearing contacts.  Avoid wearing eye makeup until the inflammation is gone. Throw away any old eye cosmetics that may be contaminated.  Change or wash your pillowcase every day.  Do not share towels or washcloths. This may spread the infection.  Wash your hands often with soap and water. Use paper towels to dry your hands.  Avoid touching or rubbing your eyes.  Do not drive or use heavy machinery if your vision is blurred. Contact a health care provider if:  You have a fever.  Your symptoms do not get better after 10 days. Get help right away if:  You have a fever and your symptoms suddenly get worse.  You have severe pain when you move your eye.  You have facial pain, redness, or swelling.  You have sudden loss of vision. This information is not intended to replace advice given to you by your health care provider. Make sure you discuss any questions you have with your health care provider. Document Released: 06/22/2005 Document Revised: 10/31/2015 Document Reviewed: 04/04/2015 Elsevier Interactive Patient Education  2017 [employer]      Agustina Caroli, MD [facility]

## 2018-02-12 NOTE — Patient Instructions (Addendum)
   IF you received an x-ray today, you will receive an invoice from [facility]. Please contact [facility] at [phone] with questions or concerns regarding your invoice.   IF you received labwork today, you will receive an invoice from [facility]. Please contact [facility] at [phone] with questions or concerns regarding your invoice.   Our billing staff will not be able to assist you with questions regarding bills from these companies.  You will be contacted with the lab results as soon as they are available. The fastest way to get your results is to activate your My Chart account. Instructions are located on the last page of this paperwork. If you have not heard from us regarding the results in 2 weeks, please contact this office.      Bacterial Conjunctivitis Bacterial conjunctivitis is an infection of the clear membrane that covers the white part of your eye and the inner surface of your eyelid (conjunctiva). When the blood vessels in your conjunctiva become inflamed, your eye becomes red or pink, and it will probably feel itchy. Bacterial conjunctivitis spreads very easily from person to person (is contagious). It also spreads easily from one eye to the other eye. What are the causes? This condition is caused by several common bacteria. You may get the infection if you come into close contact with another person who is infected. You may also come into contact with items that are contaminated with the bacteria, such as a face towel, contact lens solution, or eye makeup. What increases the risk? This condition is more likely to develop in people who:  Are exposed to other people who have the infection.  Wear contact lenses.  Have a sinus infection.  Have had a recent eye injury or surgery.  Have a weak body defense system (immune system).  Have a medical condition that causes dry eyes. What are the signs or symptoms? Symptoms of this condition  include:  Eye redness.  Tearing or watery eyes.  Itchy eyes.  Burning feeling in your eyes.  Thick, yellowish discharge from an eye. This may turn into a crust on the eyelid overnight and cause your eyelids to stick together.  Swollen eyelids.  Blurred vision. How is this diagnosed? Your health care provider can diagnose this condition based on your symptoms and medical history. Your health care provider may also take a sample of discharge from your eye to find the cause of your infection. This is rarely done. How is this treated? Treatment for this condition includes:  Antibiotic eye drops or ointment to clear the infection more quickly and prevent the spread of infection to others.  Oral antibiotic medicines to treat infections that do not respond to drops or ointments, or last longer than 10 days.  Cool, wet cloths (cool compresses) placed on the eyes.  Artificial tears applied 2-6 times a day. Follow these instructions at home: Medicines  Take or apply your antibiotic medicine as told by your health care provider. Do not stop taking or applying the antibiotic even if you start to feel better.  Take or apply over-the-counter and prescription medicines only as told by your health care provider.  Be very careful to avoid touching the edge of your eyelid with the eye drop bottle or the ointment tube when you apply medicines to the affected eye. This will keep you from spreading the infection to your other eye or to other people. Managing discomfort  Gently wipe away any drainage from your eye with a   warm, wet washcloth or a cotton ball.  Apply a cool, clean washcloth to your eye for 10-20 minutes, 3-4 times a day. General instructions  Do not wear contact lenses until the inflammation is gone and your health care provider says it is safe to wear them again. Ask your health care provider how to sterilize or replace your contact lenses before you use them again. Wear glasses  until you can resume wearing contacts.  Avoid wearing eye makeup until the inflammation is gone. Throw away any old eye cosmetics that may be contaminated.  Change or wash your pillowcase every day.  Do not share towels or washcloths. This may spread the infection.  Wash your hands often with soap and water. Use paper towels to dry your hands.  Avoid touching or rubbing your eyes.  Do not drive or use heavy machinery if your vision is blurred. Contact a health care provider if:  You have a fever.  Your symptoms do not get better after 10 days. Get help right away if:  You have a fever and your symptoms suddenly get worse.  You have severe pain when you move your eye.  You have facial pain, redness, or swelling.  You have sudden loss of vision. This information is not intended to replace advice given to you by your health care provider. Make sure you discuss any questions you have with your health care provider. Document Released: 06/22/2005 Document Revised: 10/31/2015 Document Reviewed: 04/04/2015 Elsevier Interactive Patient Education  2017 [employer]  

## 2018-02-15 LAB — CUP PACEART REMOTE DEVICE CHECK
Battery Voltage: 3.01 V
Brady Statistic AP VP Percent: 22.83 %
Brady Statistic AP VS Percent: 0 %
Brady Statistic AS VP Percent: 77.16 %
Brady Statistic AS VS Percent: 0 %
Brady Statistic RV Percent Paced: 99.99 %
Date Time Interrogation Session: 20190807171225
Implantable Lead Implant Date: 20170112
Implantable Lead Location: 753859
Implantable Lead Location: 753860
Implantable Lead Model: 5076
Implantable Pulse Generator Implant Date: 20170112
Lead Channel Impedance Value: 437 Ohm
Lead Channel Impedance Value: 456 Ohm
Lead Channel Impedance Value: 513 Ohm
Lead Channel Pacing Threshold Amplitude: 1 V
Lead Channel Sensing Intrinsic Amplitude: 26.625 mV
Lead Channel Setting Pacing Amplitude: 2 V
Lead Channel Setting Pacing Amplitude: 2.5 V
Lead Channel Setting Sensing Sensitivity: 5.6 mV
MDC IDC LEAD IMPLANT DT: 20090608
MDC IDC MSMT BATTERY REMAINING LONGEVITY: 81 mo
MDC IDC MSMT LEADCHNL RA IMPEDANCE VALUE: 361 Ohm
MDC IDC MSMT LEADCHNL RA PACING THRESHOLD AMPLITUDE: 0.625 V
MDC IDC MSMT LEADCHNL RA PACING THRESHOLD PULSEWIDTH: 0.4 ms
MDC IDC MSMT LEADCHNL RA SENSING INTR AMPL: 1.625 mV
MDC IDC MSMT LEADCHNL RA SENSING INTR AMPL: 1.625 mV
MDC IDC MSMT LEADCHNL RV PACING THRESHOLD PULSEWIDTH: 0.4 ms
MDC IDC MSMT LEADCHNL RV SENSING INTR AMPL: 26.625 mV
MDC IDC SET LEADCHNL RV PACING PULSEWIDTH: 0.4 ms
MDC IDC STAT BRADY RA PERCENT PACED: 22.83 %

## 2018-04-27 NOTE — Telephone Encounter (Signed)
Spoke with pt's wife. Wife states her husband would prefer to have his MRI conducted with [facility]. I have advised her to contact the writing provider and have them contact the facility where he will have it preformed. Otherwise, we can provide him with a MRI release form so he might have this performed at any location. Pt's wife verbalized understanding and had no additional questions.

## 2018-05-02 ENCOUNTER — Encounter: Payer: Self-pay | Admitting: Internal Medicine

## 2018-05-02 ENCOUNTER — Ambulatory Visit: Payer: BC Managed Care – PPO | Admitting: Internal Medicine

## 2018-05-02 VITALS — BP 120/68 | HR 60 | Ht 69.75 in | Wt 145.8 lb

## 2018-05-02 DIAGNOSIS — I517 Cardiomegaly: Secondary | ICD-10-CM

## 2018-05-02 DIAGNOSIS — Z950 Presence of cardiac pacemaker: Secondary | ICD-10-CM

## 2018-05-02 DIAGNOSIS — I442 Atrioventricular block, complete: Secondary | ICD-10-CM | POA: Diagnosis not present

## 2018-05-02 DIAGNOSIS — R0609 Other forms of dyspnea: Secondary | ICD-10-CM | POA: Diagnosis not present

## 2018-05-02 NOTE — Patient Instructions (Signed)

## 2018-05-02 NOTE — Progress Notes (Signed)
Patient Care Team: [name], MD as PCP - General [name], MD as Consulting Physician (Gastroenterology) [name], MD as Consulting Physician (Cardiology)   HPI  Rodney Adkins is a 62 y.o. male Seen in followup for HTN with complete heart block status post pacing. He has been seen again recently because of exercise intolerance previously had been associated with upper rate Wenckebach behavior. Because of episodes of chest pain he underwent catheterization demonstrating no obstructive coronary disease but there were gradients.LVEDP>>5  He had a broken atrial lead. He saw both Dr. Elliot Cousin and [facility] and elected to have his extraction done here. It was done by Dr. Lovena Le 1/17   4/17 Echo LVEF 60-65% with gradeint resting 29  Not  Exercised.  4/18 Echo stress Gradient  10>>50  DATE TEST EF   4/17 Echo   60-65 %   4/18 Echo    Grad >>50 mm        He continues to struggle with dyspnea on exertion and rapidly relieved by rest.  There is no peripheral edema.  No nocturnal dyspnea  For neurogenic bladder associated with his multiple sclerosis he underwent prostatectomy.       Past Medical History:  Diagnosis Date  . Complete heart block [facility]) June 2009   a. Presented with hypotension/pulm edema 12/2007 when HOCM was also discovered. s/p dual chamber MDT pacemaker. Thallium stest 7/09 negative for infiltrative disease or ischemia.  Followed Dr [name]  Marland Kitchen Heart murmur   . 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 Mgmt Dr Caryl Comes  and Dr [name] at [facility]. Has outflow obstruction on ECHO 2011  . Multiple sclerosis ([place]) 1998   Sxs started 1998 with L body numbness. MRI c/w MS. Started Avonex. Saw Dr [name] at [facility] and switched to Betaseron and was on for 5 yrs but developed depression and site rxns and D/C'd 2006. While on Betaseron, occassionally required solumedrol for flares.  . Prostate enlargement     . Right bundle branch block   . SOB (shortness of breath)    a. PFTs 09/2010 essentially normal per pulm note (mild obst defect). b. 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  . 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 Pacer reprogrammed with improvement in sx.    Past Surgical History:  Procedure Laterality Date  . LEFT HEART CATHETERIZATION WITH CORONARY ANGIOGRAM N/A 08/22/2013   Procedure: LEFT HEART CATHETERIZATION WITH CORONARY ANGIOGRAM;  Surgeon: [name], MD;  Location: [facility] CATH LAB;  Service: Cardiovascular;  Laterality: N/A;  . LEG TENDON SURGERY  2008   Os peroneus resection and peroneal tendon repair  . pace Agricultural engineer    . PACEMAKER INSERTION  12/2007   Complete heart block  . PACEMAKER LEAD REMOVAL Left 07/18/2015   Procedure: ATRIAL PACEMAKER LEAD EXTRACTION; ATRIAL PACEMAKER LEAD INSERTION; GENERATOR REPLACEMENT.;  Surgeon: [name], MD;  Location: [place];  Service: Cardiovascular;  Laterality: Left;  [name] to back up  . PERIPHERAL VASCULAR CATHETERIZATION N/A 02/12/2015   Procedure: Venogram;  Surgeon: [name], MD;  Location: [place] CV LAB;  Service: Cardiovascular;  Laterality: N/A;  . TEE WITHOUT CARDIOVERSION N/A 07/18/2015   Procedure: TRANSESOPHAGEAL ECHOCARDIOGRAM (TEE);  Surgeon: [name], MD;  Location: [facility] OR;  Service: Cardiovascular;  Laterality: N/A;    Current Outpatient Medications  Medication Sig Dispense Refill  . Ascorbic Acid (VITAMIN C) 500 MG tablet Take 500 mg by mouth daily as needed (Vitamin c supplement).     . cholecalciferol (VITAMIN D) 1000 UNITS tablet Take 1,000 Units by mouth daily. Take 5,000 units daily    . Dimethyl Fumarate (TECFIDERA) 240 MG CPDR Take 240 mg by mouth 2 (two) times daily.    . Multiple Vitamin (MULTIVITAMIN) tablet Take 1 tablet by mouth  daily.      No current facility-administered medications for this visit.     No Known Allergies  Review of Systems negative except from HPI and PMH  Physical Exam BP 120/68   Pulse 60   Ht 5' 9.75" (1.772 m)   Wt 145 lb 12.8 oz (66.1 kg)   SpO2 98%   BMI 21.07 kg/m  Well developed and nourished in no acute distress HENT normal Neck supple with JVP-flat Clear Device pocket well healed; without hematoma or erythema.  There is no tethering  Regular rate and rhythm,   M brought out by valsalva  Abd-soft with active BS No Clubbing cyanosis edema Skin-warm and dry A & Oriented  Grossly normal sensory and motor function      ECG ordered AV pacing 60 16/18/43  Assessment and  Plan  HCM  Complete Heart Block  Pacer Medtronic  The patient's device was interrogated.  The information was reviewed. No changes were made in the programming.     Dyspnea on exertion  Multiple sclerosis   Continues to struggle with dyspnea on exertion.  We reviewed options including disopyramide and pharmacological or surgical septal myectomy.  He is disinclined to undertake the procedure is also disinclined to use medication that might interfere with urinating.  Hence, he is fine the way he is.  We spent more than 50% of our >25 min visit in face to face counseling regarding the above

## 2018-05-09 ENCOUNTER — Telehealth: Payer: Self-pay

## 2018-05-09 ENCOUNTER — Ambulatory Visit (INDEPENDENT_AMBULATORY_CARE_PROVIDER_SITE_OTHER): Payer: BC Managed Care – PPO | Admitting: *Deleted

## 2018-05-09 DIAGNOSIS — I442 Atrioventricular block, complete: Secondary | ICD-10-CM

## 2018-05-09 NOTE — Telephone Encounter (Signed)
Confirmed remote transmission w/ pt wife.   

## 2018-05-10 NOTE — Progress Notes (Signed)
Remote pacemaker transmission.   

## 2018-05-11 LAB — CUP PACEART INCLINIC DEVICE CHECK
Battery Remaining Longevity: 78 mo
Battery Voltage: 3.01 V
Brady Statistic AP VS Percent: 0 %
Brady Statistic AS VS Percent: 0 %
Brady Statistic RV Percent Paced: 99.99 %
Date Time Interrogation Session: 20191028140531
Implantable Lead Implant Date: 20090608
Implantable Pulse Generator Implant Date: 20170112
Lead Channel Impedance Value: 437 Ohm
Lead Channel Impedance Value: 532 Ohm
Lead Channel Pacing Threshold Amplitude: 0.625 V
Lead Channel Pacing Threshold Amplitude: 1 V
Lead Channel Pacing Threshold Pulse Width: 0.4 ms
Lead Channel Sensing Intrinsic Amplitude: 1.75 mV
Lead Channel Sensing Intrinsic Amplitude: 2.875 mV
Lead Channel Sensing Intrinsic Amplitude: 26.625 mV
Lead Channel Setting Sensing Sensitivity: 5.6 mV
MDC IDC LEAD IMPLANT DT: 20170112
MDC IDC LEAD LOCATION: 753859
MDC IDC LEAD LOCATION: 753860
MDC IDC MSMT LEADCHNL RA IMPEDANCE VALUE: 361 Ohm
MDC IDC MSMT LEADCHNL RV IMPEDANCE VALUE: 437 Ohm
MDC IDC MSMT LEADCHNL RV PACING THRESHOLD PULSEWIDTH: 0.4 ms
MDC IDC MSMT LEADCHNL RV SENSING INTR AMPL: 26.625 mV
MDC IDC SET LEADCHNL RA PACING AMPLITUDE: 2 V
MDC IDC SET LEADCHNL RV PACING AMPLITUDE: 2.5 V
MDC IDC SET LEADCHNL RV PACING PULSEWIDTH: 0.4 ms
MDC IDC STAT BRADY AP VP PERCENT: 20.02 %
MDC IDC STAT BRADY AS VP PERCENT: 79.97 %
MDC IDC STAT BRADY RA PERCENT PACED: 20.02 %

## 2018-05-12 ENCOUNTER — Encounter: Payer: Self-pay | Admitting: Cardiology

## 2018-05-18 ENCOUNTER — Other Ambulatory Visit (HOSPITAL_COMMUNITY): Payer: Self-pay | Admitting: Psychiatry

## 2018-05-18 DIAGNOSIS — G35 Multiple sclerosis: Secondary | ICD-10-CM

## 2018-06-03 ENCOUNTER — Ambulatory Visit (HOSPITAL_COMMUNITY): Payer: BC Managed Care – PPO

## 2018-06-07 ENCOUNTER — Ambulatory Visit (HOSPITAL_COMMUNITY): Payer: BC Managed Care – PPO

## 2018-06-13 ENCOUNTER — Ambulatory Visit (HOSPITAL_COMMUNITY): Payer: BC Managed Care – PPO

## 2018-07-07 LAB — CUP PACEART REMOTE DEVICE CHECK
Battery Remaining Longevity: 77 mo
Battery Voltage: 3.01 V
Brady Statistic RA Percent Paced: 19.73 %
Date Time Interrogation Session: 20191105005558
Implantable Lead Implant Date: 20090608
Implantable Lead Implant Date: 20170112
Implantable Lead Location: 753860
Implantable Lead Model: 5076
Implantable Lead Model: 5076
Implantable Pulse Generator Implant Date: 20170112
Lead Channel Impedance Value: 361 Ohm
Lead Channel Pacing Threshold Amplitude: 0.625 V
Lead Channel Pacing Threshold Amplitude: 0.875 V
Lead Channel Pacing Threshold Pulse Width: 0.4 ms
Lead Channel Pacing Threshold Pulse Width: 0.4 ms
Lead Channel Sensing Intrinsic Amplitude: 2 mV
Lead Channel Setting Sensing Sensitivity: 5.6 mV
MDC IDC LEAD LOCATION: 753859
MDC IDC MSMT LEADCHNL RA IMPEDANCE VALUE: 418 Ohm
MDC IDC MSMT LEADCHNL RA SENSING INTR AMPL: 2 mV
MDC IDC MSMT LEADCHNL RV IMPEDANCE VALUE: 418 Ohm
MDC IDC MSMT LEADCHNL RV IMPEDANCE VALUE: 513 Ohm
MDC IDC MSMT LEADCHNL RV SENSING INTR AMPL: 12.75 mV
MDC IDC MSMT LEADCHNL RV SENSING INTR AMPL: 12.75 mV
MDC IDC SET LEADCHNL RA PACING AMPLITUDE: 2 V
MDC IDC SET LEADCHNL RV PACING AMPLITUDE: 2.5 V
MDC IDC SET LEADCHNL RV PACING PULSEWIDTH: 0.4 ms
MDC IDC STAT BRADY AP VP PERCENT: 19.74 %
MDC IDC STAT BRADY AP VS PERCENT: 0 %
MDC IDC STAT BRADY AS VP PERCENT: 80.26 %
MDC IDC STAT BRADY AS VS PERCENT: 0.01 %
MDC IDC STAT BRADY RV PERCENT PACED: 99.99 %

## 2018-07-08 ENCOUNTER — Ambulatory Visit (HOSPITAL_COMMUNITY): Admission: RE | Admit: 2018-07-08 | Payer: BC Managed Care – PPO | Source: Ambulatory Visit

## 2018-07-10 ENCOUNTER — Encounter (HOSPITAL_COMMUNITY): Payer: Self-pay

## 2018-07-11 ENCOUNTER — Ambulatory Visit (HOSPITAL_COMMUNITY)
Admission: RE | Admit: 2018-07-11 | Discharge: 2018-07-11 | Disposition: A | Discharge status: Home / Self Care | Payer: BC Managed Care – PPO | Source: Ambulatory Visit | Attending: Psychiatry | Admitting: Psychiatry

## 2018-07-11 DIAGNOSIS — G35 Multiple sclerosis: Secondary | ICD-10-CM | POA: Insufficient documentation

## 2018-07-11 LAB — POCT I-STAT CREATININE: Creatinine, Ser: 0.8 mg/dL (ref 0.61–1.24)

## 2018-07-11 MED ORDER — GADOBUTROL 1 MMOL/ML IV SOLN
7.0000 mL | Freq: Once | INTRAVENOUS | Status: AC | PRN
Start: 2018-07-11 — End: 2018-07-11
  Administered 2018-07-11: 7 mL via INTRAVENOUS

## 2018-08-08 ENCOUNTER — Ambulatory Visit: Payer: BC Managed Care – PPO

## 2018-08-11 LAB — CUP PACEART REMOTE DEVICE CHECK
Battery Remaining Longevity: 76 mo
Battery Voltage: 3.01 V
Brady Statistic AP VP Percent: 16.12 %
Brady Statistic AP VS Percent: 0 %
Brady Statistic AS VS Percent: 0 %
Brady Statistic RV Percent Paced: 99.99 %
Implantable Lead Implant Date: 20090608
Implantable Lead Model: 5076
Implantable Lead Model: 5076
Lead Channel Impedance Value: 361 Ohm
Lead Channel Impedance Value: 418 Ohm
Lead Channel Impedance Value: 532 Ohm
Lead Channel Pacing Threshold Amplitude: 0.625 V
Lead Channel Pacing Threshold Amplitude: 1 V
Lead Channel Pacing Threshold Pulse Width: 0.4 ms
Lead Channel Pacing Threshold Pulse Width: 0.4 ms
Lead Channel Sensing Intrinsic Amplitude: 2.25 mV
MDC IDC LEAD IMPLANT DT: 20170112
MDC IDC LEAD LOCATION: 753859
MDC IDC LEAD LOCATION: 753860
MDC IDC MSMT LEADCHNL RA SENSING INTR AMPL: 2.25 mV
MDC IDC MSMT LEADCHNL RV IMPEDANCE VALUE: 456 Ohm
MDC IDC MSMT LEADCHNL RV SENSING INTR AMPL: 12.75 mV
MDC IDC MSMT LEADCHNL RV SENSING INTR AMPL: 12.75 mV
MDC IDC PG IMPLANT DT: 20170112
MDC IDC SESS DTM: 20200203153959
MDC IDC SET LEADCHNL RA PACING AMPLITUDE: 2 V
MDC IDC SET LEADCHNL RV PACING AMPLITUDE: 2.5 V
MDC IDC SET LEADCHNL RV PACING PULSEWIDTH: 0.4 ms
MDC IDC SET LEADCHNL RV SENSING SENSITIVITY: 5.6 mV
MDC IDC STAT BRADY AS VP PERCENT: 83.87 %
MDC IDC STAT BRADY RA PERCENT PACED: 16.12 %

## 2018-11-15 ENCOUNTER — Ambulatory Visit (INDEPENDENT_AMBULATORY_CARE_PROVIDER_SITE_OTHER): Payer: BC Managed Care – PPO | Admitting: *Deleted

## 2018-11-15 ENCOUNTER — Other Ambulatory Visit: Payer: Self-pay

## 2018-11-15 DIAGNOSIS — I442 Atrioventricular block, complete: Secondary | ICD-10-CM | POA: Diagnosis not present

## 2018-11-15 DIAGNOSIS — I517 Cardiomegaly: Secondary | ICD-10-CM

## 2018-11-16 LAB — CUP PACEART REMOTE DEVICE CHECK
Battery Remaining Longevity: 75 mo
Battery Voltage: 3.01 V
Brady Statistic AP VP Percent: 22.92 %
Brady Statistic AP VS Percent: 0 %
Brady Statistic AS VP Percent: 77.08 %
Brady Statistic AS VS Percent: 0 %
Brady Statistic RA Percent Paced: 22.92 %
Brady Statistic RV Percent Paced: 99.99 %
Date Time Interrogation Session: 20200511194646
Implantable Lead Implant Date: 20090608
Implantable Lead Implant Date: 20170112
Implantable Lead Location: 753859
Implantable Lead Location: 753860
Implantable Lead Model: 5076
Implantable Lead Model: 5076
Implantable Pulse Generator Implant Date: 20170112
Lead Channel Impedance Value: 380 Ohm
Lead Channel Impedance Value: 456 Ohm
Lead Channel Impedance Value: 456 Ohm
Lead Channel Impedance Value: 551 Ohm
Lead Channel Pacing Threshold Amplitude: 0.625 V
Lead Channel Pacing Threshold Amplitude: 0.875 V
Lead Channel Pacing Threshold Pulse Width: 0.4 ms
Lead Channel Pacing Threshold Pulse Width: 0.4 ms
Lead Channel Sensing Intrinsic Amplitude: 12.75 mV
Lead Channel Sensing Intrinsic Amplitude: 12.75 mV
Lead Channel Sensing Intrinsic Amplitude: 2.625 mV
Lead Channel Sensing Intrinsic Amplitude: 2.625 mV
Lead Channel Setting Pacing Amplitude: 2 V
Lead Channel Setting Pacing Amplitude: 2.5 V
Lead Channel Setting Pacing Pulse Width: 0.4 ms
Lead Channel Setting Sensing Sensitivity: 5.6 mV

## 2018-11-30 NOTE — Progress Notes (Signed)
Remote pacemaker transmission.   

## 2019-02-14 ENCOUNTER — Ambulatory Visit (INDEPENDENT_AMBULATORY_CARE_PROVIDER_SITE_OTHER): Payer: BC Managed Care – PPO | Admitting: *Deleted

## 2019-02-14 DIAGNOSIS — I442 Atrioventricular block, complete: Secondary | ICD-10-CM

## 2019-02-16 LAB — CUP PACEART REMOTE DEVICE CHECK
Battery Remaining Longevity: 74 mo
Battery Voltage: 3.01 V
Brady Statistic AP VP Percent: 27.44 %
Brady Statistic AP VS Percent: 0 %
Brady Statistic AS VP Percent: 72.56 %
Brady Statistic AS VS Percent: 0 %
Brady Statistic RA Percent Paced: 27.44 %
Brady Statistic RV Percent Paced: 99.99 %
Date Time Interrogation Session: 20200812175701
Implantable Lead Implant Date: 20090608
Implantable Lead Implant Date: 20170112
Implantable Lead Location: 753859
Implantable Lead Location: 753860
Implantable Lead Model: 5076
Implantable Lead Model: 5076
Implantable Pulse Generator Implant Date: 20170112
Lead Channel Impedance Value: 361 Ohm
Lead Channel Impedance Value: 418 Ohm
Lead Channel Impedance Value: 437 Ohm
Lead Channel Impedance Value: 513 Ohm
Lead Channel Pacing Threshold Amplitude: 0.625 V
Lead Channel Pacing Threshold Amplitude: 1 V
Lead Channel Pacing Threshold Pulse Width: 0.4 ms
Lead Channel Pacing Threshold Pulse Width: 0.4 ms
Lead Channel Sensing Intrinsic Amplitude: 2.125 mV
Lead Channel Sensing Intrinsic Amplitude: 2.125 mV
Lead Channel Sensing Intrinsic Amplitude: 23.625 mV
Lead Channel Sensing Intrinsic Amplitude: 23.625 mV
Lead Channel Setting Pacing Amplitude: 2 V
Lead Channel Setting Pacing Amplitude: 2.5 V
Lead Channel Setting Pacing Pulse Width: 0.4 ms
Lead Channel Setting Sensing Sensitivity: 5.6 mV

## 2019-02-23 ENCOUNTER — Encounter: Payer: Self-pay | Admitting: Cardiology

## 2019-02-23 NOTE — Progress Notes (Signed)
Remote pacemaker transmission.   

## 2019-05-16 ENCOUNTER — Encounter: Payer: BC Managed Care – PPO | Admitting: *Deleted

## 2019-05-18 ENCOUNTER — Telehealth: Payer: Self-pay

## 2019-05-18 NOTE — Telephone Encounter (Signed)
Left message for patient to remind of missed remote transmission. LL 

## 2019-06-16 LAB — CUP PACEART REMOTE DEVICE CHECK
Battery Remaining Longevity: 66 mo
Battery Voltage: 3 V
Brady Statistic AP VP Percent: 20.03 %
Brady Statistic AP VS Percent: 0 %
Brady Statistic AS VP Percent: 79.97 %
Brady Statistic AS VS Percent: 0 %
Brady Statistic RA Percent Paced: 20.03 %
Brady Statistic RV Percent Paced: 99.99 %
Date Time Interrogation Session: 20201210225446
Implantable Lead Implant Date: 20090608
Implantable Lead Implant Date: 20170112
Implantable Lead Location: 753859
Implantable Lead Location: 753860
Implantable Lead Model: 5076
Implantable Lead Model: 5076
Implantable Pulse Generator Implant Date: 20170112
Lead Channel Impedance Value: 361 Ohm
Lead Channel Impedance Value: 418 Ohm
Lead Channel Impedance Value: 456 Ohm
Lead Channel Impedance Value: 532 Ohm
Lead Channel Pacing Threshold Amplitude: 0.5 V
Lead Channel Pacing Threshold Amplitude: 0.875 V
Lead Channel Pacing Threshold Pulse Width: 0.4 ms
Lead Channel Pacing Threshold Pulse Width: 0.4 ms
Lead Channel Sensing Intrinsic Amplitude: 2.875 mV
Lead Channel Sensing Intrinsic Amplitude: 2.875 mV
Lead Channel Sensing Intrinsic Amplitude: 23.625 mV
Lead Channel Sensing Intrinsic Amplitude: 23.625 mV
Lead Channel Setting Pacing Amplitude: 2 V
Lead Channel Setting Pacing Amplitude: 2.5 V
Lead Channel Setting Pacing Pulse Width: 0.4 ms
Lead Channel Setting Sensing Sensitivity: 5.6 mV

## 2019-08-06 ENCOUNTER — Ambulatory Visit: Payer: BC Managed Care – PPO

## 2019-09-14 ENCOUNTER — Ambulatory Visit (INDEPENDENT_AMBULATORY_CARE_PROVIDER_SITE_OTHER): Payer: BC Managed Care – PPO | Admitting: *Deleted

## 2019-09-14 DIAGNOSIS — I442 Atrioventricular block, complete: Secondary | ICD-10-CM

## 2019-09-15 LAB — CUP PACEART REMOTE DEVICE CHECK
Battery Remaining Longevity: 65 mo
Battery Voltage: 3 V
Brady Statistic AP VP Percent: 17.41 %
Brady Statistic AP VS Percent: 0 %
Brady Statistic AS VP Percent: 82.59 %
Brady Statistic AS VS Percent: 0 %
Brady Statistic RA Percent Paced: 17.41 %
Brady Statistic RV Percent Paced: 99.99 %
Date Time Interrogation Session: 20210312143417
Implantable Lead Implant Date: 20090608
Implantable Lead Implant Date: 20170112
Implantable Lead Location: 753859
Implantable Lead Location: 753860
Implantable Lead Model: 5076
Implantable Lead Model: 5076
Implantable Pulse Generator Implant Date: 20170112
Lead Channel Impedance Value: 361 Ohm
Lead Channel Impedance Value: 418 Ohm
Lead Channel Impedance Value: 494 Ohm
Lead Channel Impedance Value: 570 Ohm
Lead Channel Pacing Threshold Amplitude: 0.625 V
Lead Channel Pacing Threshold Amplitude: 0.875 V
Lead Channel Pacing Threshold Pulse Width: 0.4 ms
Lead Channel Pacing Threshold Pulse Width: 0.4 ms
Lead Channel Sensing Intrinsic Amplitude: 15.125 mV
Lead Channel Sensing Intrinsic Amplitude: 15.125 mV
Lead Channel Sensing Intrinsic Amplitude: 3.5 mV
Lead Channel Sensing Intrinsic Amplitude: 3.5 mV
Lead Channel Setting Pacing Amplitude: 2 V
Lead Channel Setting Pacing Amplitude: 2.5 V
Lead Channel Setting Pacing Pulse Width: 0.4 ms
Lead Channel Setting Sensing Sensitivity: 5.6 mV

## 2019-12-14 ENCOUNTER — Ambulatory Visit (INDEPENDENT_AMBULATORY_CARE_PROVIDER_SITE_OTHER): Payer: BC Managed Care – PPO | Admitting: *Deleted

## 2019-12-14 DIAGNOSIS — I442 Atrioventricular block, complete: Secondary | ICD-10-CM | POA: Diagnosis not present

## 2019-12-15 ENCOUNTER — Telehealth: Payer: Self-pay

## 2019-12-15 NOTE — Telephone Encounter (Signed)
Left message for patient to remind of missed remote transmission.  

## 2019-12-17 LAB — CUP PACEART REMOTE DEVICE CHECK
Battery Remaining Longevity: 62 mo
Battery Voltage: 2.99 V
Brady Statistic AP VP Percent: 16.95 %
Brady Statistic AP VS Percent: 0 %
Brady Statistic AS VP Percent: 83.05 %
Brady Statistic AS VS Percent: 0 %
Brady Statistic RA Percent Paced: 16.95 %
Brady Statistic RV Percent Paced: 99.99 %
Date Time Interrogation Session: 20210611183017
Implantable Lead Implant Date: 20090608
Implantable Lead Implant Date: 20170112
Implantable Lead Location: 753859
Implantable Lead Location: 753860
Implantable Lead Model: 5076
Implantable Lead Model: 5076
Implantable Pulse Generator Implant Date: 20170112
Lead Channel Impedance Value: 342 Ohm
Lead Channel Impedance Value: 418 Ohm
Lead Channel Impedance Value: 437 Ohm
Lead Channel Impedance Value: 532 Ohm
Lead Channel Pacing Threshold Amplitude: 0.625 V
Lead Channel Pacing Threshold Amplitude: 1 V
Lead Channel Pacing Threshold Pulse Width: 0.4 ms
Lead Channel Pacing Threshold Pulse Width: 0.4 ms
Lead Channel Sensing Intrinsic Amplitude: 20.25 mV
Lead Channel Sensing Intrinsic Amplitude: 20.25 mV
Lead Channel Sensing Intrinsic Amplitude: 3 mV
Lead Channel Sensing Intrinsic Amplitude: 3 mV
Lead Channel Setting Pacing Amplitude: 2 V
Lead Channel Setting Pacing Amplitude: 2.5 V
Lead Channel Setting Pacing Pulse Width: 0.4 ms
Lead Channel Setting Sensing Sensitivity: 5.6 mV

## 2019-12-18 NOTE — Progress Notes (Signed)
Remote pacemaker transmission.   

## 2020-03-14 ENCOUNTER — Ambulatory Visit (INDEPENDENT_AMBULATORY_CARE_PROVIDER_SITE_OTHER): Payer: BC Managed Care – PPO | Admitting: *Deleted

## 2020-03-14 DIAGNOSIS — I442 Atrioventricular block, complete: Secondary | ICD-10-CM

## 2020-03-16 LAB — CUP PACEART REMOTE DEVICE CHECK
Battery Remaining Longevity: 56 mo
Battery Voltage: 2.99 V
Brady Statistic AP VP Percent: 23.25 %
Brady Statistic AP VS Percent: 0 %
Brady Statistic AS VP Percent: 76.74 %
Brady Statistic AS VS Percent: 0 %
Brady Statistic RA Percent Paced: 23.25 %
Brady Statistic RV Percent Paced: 99.99 %
Date Time Interrogation Session: 20210910153450
Implantable Lead Implant Date: 20090608
Implantable Lead Implant Date: 20170112
Implantable Lead Location: 753859
Implantable Lead Location: 753860
Implantable Lead Model: 5076
Implantable Lead Model: 5076
Implantable Pulse Generator Implant Date: 20170112
Lead Channel Impedance Value: 342 Ohm
Lead Channel Impedance Value: 380 Ohm
Lead Channel Impedance Value: 456 Ohm
Lead Channel Impedance Value: 532 Ohm
Lead Channel Pacing Threshold Amplitude: 0.625 V
Lead Channel Pacing Threshold Amplitude: 0.875 V
Lead Channel Pacing Threshold Pulse Width: 0.4 ms
Lead Channel Pacing Threshold Pulse Width: 0.4 ms
Lead Channel Sensing Intrinsic Amplitude: 11.75 mV
Lead Channel Sensing Intrinsic Amplitude: 11.75 mV
Lead Channel Sensing Intrinsic Amplitude: 3.875 mV
Lead Channel Sensing Intrinsic Amplitude: 3.875 mV
Lead Channel Setting Pacing Amplitude: 2 V
Lead Channel Setting Pacing Amplitude: 2.5 V
Lead Channel Setting Pacing Pulse Width: 0.4 ms
Lead Channel Setting Sensing Sensitivity: 5.6 mV

## 2020-03-19 NOTE — Progress Notes (Signed)
Remote pacemaker transmission.   

## 2020-05-15 ENCOUNTER — Observation Stay (HOSPITAL_COMMUNITY)
Admission: EM | Admit: 2020-05-15 | Discharge: 2020-05-17 | Disposition: A | Discharge status: Home / Self Care | Payer: BC Managed Care – PPO | Attending: Family Medicine | Admitting: Family Medicine

## 2020-05-15 ENCOUNTER — Other Ambulatory Visit (HOSPITAL_COMMUNITY): Payer: Self-pay

## 2020-05-15 ENCOUNTER — Encounter (HOSPITAL_COMMUNITY): Payer: Self-pay | Admitting: *Deleted

## 2020-05-15 ENCOUNTER — Other Ambulatory Visit: Payer: Self-pay

## 2020-05-15 ENCOUNTER — Telehealth: Payer: Self-pay

## 2020-05-15 ENCOUNTER — Observation Stay (HOSPITAL_COMMUNITY): Payer: BC Managed Care – PPO

## 2020-05-15 ENCOUNTER — Emergency Department (HOSPITAL_COMMUNITY): Payer: BC Managed Care – PPO

## 2020-05-15 DIAGNOSIS — R29818 Other symptoms and signs involving the nervous system: Secondary | ICD-10-CM | POA: Diagnosis not present

## 2020-05-15 DIAGNOSIS — Z7982 Long term (current) use of aspirin: Secondary | ICD-10-CM | POA: Insufficient documentation

## 2020-05-15 DIAGNOSIS — R479 Unspecified speech disturbances: Secondary | ICD-10-CM | POA: Diagnosis present

## 2020-05-15 DIAGNOSIS — Z950 Presence of cardiac pacemaker: Secondary | ICD-10-CM | POA: Diagnosis not present

## 2020-05-15 DIAGNOSIS — Z7901 Long term (current) use of anticoagulants: Secondary | ICD-10-CM | POA: Insufficient documentation

## 2020-05-15 DIAGNOSIS — I639 Cerebral infarction, unspecified: Secondary | ICD-10-CM | POA: Diagnosis present

## 2020-05-15 DIAGNOSIS — R4182 Altered mental status, unspecified: Secondary | ICD-10-CM | POA: Diagnosis present

## 2020-05-15 DIAGNOSIS — Z20822 Contact with and (suspected) exposure to covid-19: Secondary | ICD-10-CM | POA: Insufficient documentation

## 2020-05-15 DIAGNOSIS — I442 Atrioventricular block, complete: Secondary | ICD-10-CM | POA: Insufficient documentation

## 2020-05-15 DIAGNOSIS — G35 Multiple sclerosis: Principal | ICD-10-CM | POA: Insufficient documentation

## 2020-05-15 DIAGNOSIS — R404 Transient alteration of awareness: Secondary | ICD-10-CM

## 2020-05-15 LAB — CBC
HCT: 46.1 % (ref 39.0–52.0)
HCT: 44 % (ref 39.0–52.0)
Hemoglobin: 15.4 g/dL (ref 13.0–17.0)
Hemoglobin: 14.8 g/dL (ref 13.0–17.0)
MCH: 30.5 pg (ref 26.0–34.0)
MCH: 30.2 pg (ref 26.0–34.0)
MCHC: 33.4 g/dL (ref 30.0–36.0)
MCHC: 33.6 g/dL (ref 30.0–36.0)
MCV: 91.3 fL (ref 80.0–100.0)
MCV: 89.8 fL (ref 80.0–100.0)
Platelets: 222 10*3/uL (ref 150–400)
Platelets: 198 10*3/uL (ref 150–400)
RBC: 5.05 MIL/uL (ref 4.22–5.81)
RBC: 4.9 MIL/uL (ref 4.22–5.81)
RDW: 12.2 % (ref 11.5–15.5)
RDW: 12.1 % (ref 11.5–15.5)
WBC: 6.7 10*3/uL (ref 4.0–10.5)
WBC: 7.5 10*3/uL (ref 4.0–10.5)
nRBC: 0 % (ref 0.0–0.2)
nRBC: 0 % (ref 0.0–0.2)

## 2020-05-15 LAB — I-STAT CHEM 8, ED
BUN: 18 mg/dL (ref 8–23)
Calcium, Ion: 1.21 mmol/L (ref 1.15–1.40)
Chloride: 105 mmol/L (ref 98–111)
Creatinine, Ser: 0.8 mg/dL (ref 0.61–1.24)
Glucose, Bld: 99 mg/dL (ref 70–99)
HCT: 45 % (ref 39.0–52.0)
Hemoglobin: 15.3 g/dL (ref 13.0–17.0)
Potassium: 4.7 mmol/L (ref 3.5–5.1)
Sodium: 141 mmol/L (ref 135–145)
TCO2: 26 mmol/L (ref 22–32)

## 2020-05-15 LAB — DIFFERENTIAL
Abs Immature Granulocytes: 0.02 10*3/uL (ref 0.00–0.07)
Basophils Absolute: 0.1 10*3/uL (ref 0.0–0.1)
Basophils Relative: 1 %
Eosinophils Absolute: 0.1 10*3/uL (ref 0.0–0.5)
Eosinophils Relative: 1 %
Immature Granulocytes: 0 %
Lymphocytes Relative: 18 %
Lymphs Abs: 1.2 10*3/uL (ref 0.7–4.0)
Monocytes Absolute: 0.7 10*3/uL (ref 0.1–1.0)
Monocytes Relative: 11 %
Neutro Abs: 4.6 10*3/uL (ref 1.7–7.7)
Neutrophils Relative %: 69 %

## 2020-05-15 LAB — RESPIRATORY PANEL BY RT PCR (FLU A&B, COVID)
Influenza A by PCR: NEGATIVE
Influenza B by PCR: NEGATIVE
SARS Coronavirus 2 by RT PCR: NEGATIVE

## 2020-05-15 LAB — COMPREHENSIVE METABOLIC PANEL
ALT: 21 U/L (ref 0–44)
AST: 24 U/L (ref 15–41)
Albumin: 4.1 g/dL (ref 3.5–5.0)
Alkaline Phosphatase: 76 U/L (ref 38–126)
Anion gap: 7 (ref 5–15)
BUN: 14 mg/dL (ref 8–23)
CO2: 28 mmol/L (ref 22–32)
Calcium: 9.9 mg/dL (ref 8.9–10.3)
Chloride: 105 mmol/L (ref 98–111)
Creatinine, Ser: 0.92 mg/dL (ref 0.61–1.24)
GFR, Estimated: >60 mL/min (ref >60)
Glucose, Bld: 106 mg/dL — ABNORMAL HIGH (ref 70–99)
Potassium: 5 mmol/L (ref 3.5–5.1)
Sodium: 140 mmol/L (ref 135–145)
Total Bilirubin: 0.9 mg/dL (ref 0.3–1.2)
Total Protein: 6.8 g/dL (ref 6.5–8.1)

## 2020-05-15 LAB — HIV ANTIBODY (ROUTINE TESTING W REFLEX): HIV Screen 4th Generation wRfx: NONREACTIVE

## 2020-05-15 LAB — CREATININE, SERUM
Creatinine, Ser: 0.88 mg/dL (ref 0.61–1.24)
GFR, Estimated: >60 mL/min (ref >60)

## 2020-05-15 LAB — CBG MONITORING, ED: Glucose-Capillary: 105 mg/dL — ABNORMAL HIGH (ref 70–99)

## 2020-05-15 LAB — PROTIME-INR
INR: 1.1 (ref 0.8–1.2)
Prothrombin Time: 14.1 seconds (ref 11.4–15.2)

## 2020-05-15 LAB — APTT: aPTT: 32 seconds (ref 24–36)

## 2020-05-15 MED ORDER — PREDNISONE 50 MG PO TABS: 1250.0000 mg | ORAL_TABLET | Freq: Every day | ORAL | 0 refills | Status: AC

## 2020-05-15 MED ORDER — ENOXAPARIN SODIUM 40 MG/0.4ML ~~LOC~~ SOLN
40.0000 mg | SUBCUTANEOUS | Status: DC
  Administered 2020-05-15: 40 mg via SUBCUTANEOUS
  Filled 2020-05-15: qty 0.4

## 2020-05-15 MED ORDER — LORAZEPAM 2 MG/ML IJ SOLN: 1.0000 mg | Freq: Once | INTRAMUSCULAR | Status: DC | PRN

## 2020-05-15 MED ORDER — GADOTERIDOL 279.3 MG/ML IV SOLN: 6.0000 mL | Freq: Once | INTRAVENOUS | Status: DC | PRN

## 2020-05-15 MED ORDER — LORAZEPAM 1 MG PO TABS
1.0000 mg | ORAL_TABLET | Freq: Once | ORAL | Status: AC | PRN
  Administered 2020-05-16: 1 mg via ORAL
  Filled 2020-05-15: qty 1

## 2020-05-15 MED ORDER — LORAZEPAM 1 MG PO TABS: 1.0000 mg | ORAL_TABLET | Freq: Once | ORAL | Status: DC | PRN

## 2020-05-15 MED ORDER — STROKE: EARLY STAGES OF RECOVERY BOOK
Freq: Once | Status: AC
  Filled 2020-05-15: qty 1

## 2020-05-15 MED ORDER — ONDANSETRON HCL 4 MG PO TABS: 4.0000 mg | ORAL_TABLET | Freq: Four times a day (QID) | ORAL | Status: DC | PRN

## 2020-05-15 MED ORDER — GADOBUTROL 1 MMOL/ML IV SOLN
6.0000 mL | Freq: Once | INTRAVENOUS | Status: AC | PRN
  Administered 2020-05-15: 6 mL via INTRAVENOUS

## 2020-05-15 MED ORDER — ADULT MULTIVITAMIN W/MINERALS CH
1.0000 | ORAL_TABLET | Freq: Every day | ORAL | Status: DC
  Administered 2020-05-16 – 2020-05-17 (×2): 1 via ORAL
  Filled 2020-05-15 (×2): qty 1

## 2020-05-15 MED ORDER — ASPIRIN EC 81 MG PO TBEC
81.0000 mg | DELAYED_RELEASE_TABLET | Freq: Every day | ORAL | Status: DC
  Administered 2020-05-16 – 2020-05-17 (×2): 81 mg via ORAL
  Filled 2020-05-15 (×2): qty 1

## 2020-05-15 MED ORDER — ACETAMINOPHEN 650 MG RE SUPP: 650.0000 mg | Freq: Four times a day (QID) | RECTAL | Status: DC | PRN

## 2020-05-15 MED ORDER — CLOPIDOGREL BISULFATE 75 MG PO TABS
75.0000 mg | ORAL_TABLET | Freq: Every day | ORAL | Status: DC
  Administered 2020-05-16 – 2020-05-17 (×2): 75 mg via ORAL
  Filled 2020-05-15 (×2): qty 1

## 2020-05-15 MED ORDER — ACETAMINOPHEN 325 MG PO TABS: 650.0000 mg | ORAL_TABLET | Freq: Four times a day (QID) | ORAL | Status: DC | PRN

## 2020-05-15 MED ORDER — ONDANSETRON HCL 4 MG/2ML IJ SOLN: 4.0000 mg | Freq: Four times a day (QID) | INTRAMUSCULAR | Status: DC | PRN

## 2020-05-15 NOTE — Progress Notes (Signed)
I was called to admit Rodney Adkins, but at he was considering going home and not being admitted. Dr. Loraine Maple at the bedside.   Plan to call [facility] again if patient decides to be admitted to the hospital.

## 2020-05-15 NOTE — ED Notes (Signed)
Still at MRI 

## 2020-05-15 NOTE — ED Notes (Signed)
Pt returned from xray

## 2020-05-15 NOTE — ED Notes (Signed)
Rodney Adkins [phone] wants to be contacted if Pt is admitted

## 2020-05-15 NOTE — Progress Notes (Signed)
Changed device settings for MRI to  DOO at 75bpm per orders Will Program device back to pre-MRI settings after completion of exam and send transmission

## 2020-05-15 NOTE — ED Provider Notes (Signed)
MRI somewhat limited but did not show any evidence of an acute stroke.  In discussion with neurology is still a possibility so the recommending admission for observation.  Patient does have a history of MS but is never had any kind of flare to this degree in the past or symptoms like this.  Patient followed by [facility].    Fredia Sorrow, MD 05/15/20 [phone]

## 2020-05-15 NOTE — Telephone Encounter (Signed)
Patient wife called in and wanted to let you know that she is taking patient into the ER to rule out that he has not had a stroke and wants to let SK know so that he is aware

## 2020-05-15 NOTE — ED Notes (Signed)
Back from MRI.

## 2020-05-15 NOTE — Discharge Instructions (Addendum)
Cognitive Rehabilitation After a Stroke After a stroke, you may have various problems with thinking (cognitive disability). The types of problems you have will depend on how severe the stroke was and where it was located in the brain. Problems may include:  Problems with short-term memory.  Trouble paying attention.  Trouble communicating or understanding language (aphasia).  A drop in mental ability that may interfere with daily life (dementia).  Trouble with problem-solving and information processing.  Problems with reading, writing, or math.  Problems with your ability to plan and to perform activities in sequence (executive function). These problems can feel overwhelming. However, with rehabilitation and time to heal, many people have improvement in their symptoms. What causes cognitive disability? A stroke happens when blood cannot flow to certain areas of the brain. When this happens, brain cells die in the affected areas because they cannot get oxygen and nutrients from the blood. Cognitive disability is caused by the death of cells in the areas of the brain that control thinking. What is cognitive rehabilitation? Cognitive rehabilitation is a program to help you improve your thinking skills after a stroke. Rehabilitation cannot completely reverse the effects of a stroke, but it can help you with memory, problem-solving, and communication skills. Therapy focuses on:  Improving brain function. This may involve activities such as learning to break down tasks into simple steps.  Helping you learn ways to cope with thinking problems. For example, you might learn memory tricks or do activities that stimulate memory, such as naming objects or describing pictures. Cognitive rehabilitation may include:  Speech-language therapy to help you understand and use language to communicate.  Occupational therapy to help you perform daily activities.  Music therapy to help relieve stress,  anxiety, and depression. This may involve listening to music, singing, or playing instruments.  Physical therapy to help improve your ability to move and perform actions that involve the muscles (motor functions). When will therapy start and where will I have therapy? Your health care provider will decide when it is best for you to start therapy. In some cases, people start rehabilitation as soon as their health is stable, which may be 24-48 hours after the stroke. Rehabilitation can take place in a few different places, based on your needs. It may take place in:  The hospital or an in-patient rehabilitation hospital.  An outpatient rehabilitation facility.  A long-term care facility.  A community rehabilitation clinic.  Your home. What are some tools to help after a stroke? There are a number of tools and apps that you can use on your smartphone, personal computer, or tablet to help improve brain function. Some of these apps include:  Calendar reminders or alarm apps to help with memory.  Note-taking or sketch pad apps to help with memory or communication.  Text-to-speech apps that allow you to listen to what you are reading, which helps your ability to understanding text.  Picture dictionary or picture message apps to help with communication.  E-readers. These can highlight text as it is read aloud, which helps with listening and reading skills. How can my friends or family help during my rehabilitation? During your recovery, it is important that your friends and family members help you work toward more independence. Your caregivers should speak with your health care providers to learn how they can best help you during recovery. This may include working on speech-language or memory exercises at home, or helping with daily tasks and errands. If you have cognitive disability, you  may be at risk for injury or accidents at home, such as forgetting to turn off the stove. Friends and family  members can help ensure home safety by taking steps such as getting appliances with automatic shut-off features or storing dangerous objects in a secure place. What else should I know about cognitive rehabilitation after a stroke? Having trouble with memory and problem-solving can make you feel alone. You may also have mood changes, anxiety, or depression after a stroke. It is important to:  Stay connected with others through social groups, online support groups, or your community.  Talk to your friends, family, and caregivers about any emotional problems you are having.  Go to one-on-one or group therapy as suggested by your health care provider.  Stay physically active and exercise as often as suggested by your health care provider. Summary  After a stroke, some people have problems with thinking that affect attention, memory, language, communication, and problem-solving.  Cognitive rehabilitation is a program to help you regain brain function and learn skills to cope with thinking problems.  Rehabilitation cannot completely reverse the effects of a stroke, but it can help to improve quality of life.  Cognitive rehabilitation may include speech-language therapy, occupational therapy, music therapy, and physical therapy. This information is not intended to replace advice given to you by your health care provider. Make sure you discuss any questions you have with your health care provider. Document Revised: 10/12/2018 Document Reviewed: 09/25/2016 Elsevier Patient Education  2020 [place] After a Stroke, Adult A stroke causes damage to the brain cells, which can affect your ability to walk, talk, or remember things. The impact of a stroke is different for everyone, and so is recovery. Some people have progress during the first few days after treatment. Others may take weeks or longer to make progress. Stroke rehabilitation includes a variety of treatments to help you  recover and promote your independence after a stroke. You may not be able do everything that you did before the stroke, but you can learn ways to manage your lifestyle and be as independent as possible. Rehabilitation will start as soon as you are able to participate after your stroke, and it involves care from a team that may include:  Family and friends. Your loved ones know you best and can be very helpful in your recovery.  Physicians.  Nurses.  Physical and occupational therapists.  Speech-language therapists.  A nutritionist.  A psychologist.  A Education officer, museum. Keep open communication with all members of your care team. Share your medical records if needed, and take notes about each provider's recommendations. What is physical therapy? Physical therapists (PTs) help you to improve your coordination, balance, and muscle strength. Physical therapy may involve:  Range of motion exercises.  Help to move between lying, sitting, and standing positions.  Walking with a cane or walker, if needed.  Help using stairs. What is occupational therapy? Occupational therapists (OTs) help you rebuild your ability to do everyday tasks, such as brushing your teeth, going to the bathroom, eating, and getting dressed. Occupational therapy may also help with:  Vision. Visual scanning is a technique that is used to prevent falls.  Memory and cognitive training. This therapy includes problem-solving techniques and relearning tasks like making a phone call.  Fine muscle movements such as buttoning a shirt or picking up small objects. What is speech therapy? Speech-language therapists help you communicate. After a stroke, you may have problems understanding what people are saying,  or you may have trouble writing, speaking, or finding the right word for what you want to say. You may also need speech therapy if you have difficulty swallowing while eating and drinking. Examples of speech-language  therapies include:  Techniques to strengthen muscles used in swallowing.  Naming objects or describing pictures. This helps retrain the brain to recognize and remember words.  Exercises to strengthen the muscles involved in talking, including your tongue and lips.  Exercises to retrain your brain in understanding what you read and hear. How often will I need therapy? Therapy will begin as soon as you are able to participate, which is often within the first few days after a stroke. Sessions will be frequent at first. For example, you may have therapy 2-3 hours a day on most days of the week during the first few months. The intensity depends on the type and severity of your stroke. You may need therapy for several months. Therapy may take place in the hospital, at a rehabilitation center, or in your home. Are there any side effects of therapy? Therapy is safe and is usually well-tolerated. You may feel physically and mentally tired after therapy, especially during the first few weeks. Rest before therapy sessions if you need to so you can get the most out of your rehabilitation. Follow these instructions at home:  Involve your family and friends in your recovery, if possible. Having another person to encourage you is beneficial.  Follow instructions from your speech-language therapist, nutritionist, or health care provider about what you can safely eat and drink. Eat healthy foods. If your ability to swallow was affected by the stroke, you may need to take steps to avoid choking, such as: ? Taking small bites when eating. ? Eating foods that are soft or pureed. ? Drinking liquids that have been thickened.  Maintain social connections and interactions with friends, family, and community groups. This is an important part of your recovery. Communication challenges and physical challenges may cause you to feel isolated after a stroke.  Consider joining a support group that allows you to talk about  the impact of stroke on your life. A psychologist or counselor may be recommended. Your emotional recovery from stroke is just as important as your physical recovery.  Keep all follow-up visits as told by your health care providers. This is important. Summary  Stroke rehabilitation includes a variety of treatments to help you recover and promote your independence after a stroke.  Rehabilitation will start as soon as you are able to participate after your stroke, and it includes care from a team of experts.  The intensity of therapy depends on the type and severity of your stroke. You may need therapy for several months. This information is not intended to replace advice given to you by your health care provider. Make sure you discuss any questions you have with your health care provider. Document Revised: 10/12/2018 Document Reviewed: 06/23/2016 Elsevier Patient Education  [place]. Physical Therapy After a Stroke After a stroke, some people experience physical changes or problems. Physical therapy may be prescribed to help you recover and overcome problems such as:  Inability to move (paralysis) or weakness, typically affecting one side of the body.  Trouble with balance.  Pain, a pins and needles sensation, or numbness in certain parts of the body. You may also have difficulty feeling touch, pressure, or changes in temperature.  Involuntary muscle tightening (spasticity).  Stiffness in muscles and joints.  Altered coordination and reflexes.  What causes physical disability after a stroke? A stroke can damage parts of your brain that control your body's normal functions, including your ability to move and to keep your balance. The types of physical problems you have will depend on how severe the stroke was and where it was located in the brain. Weakness or paralysis may affect just your fingers and hands, a whole leg or arm, or an entire side of your body. What is physical  therapy? Physical therapy involves using exercises, stretches, and activities to help you regain movement and independence after your stroke. Physical therapy may focus on one or more of the following:  Range of motion. This can help with movement and reduce muscle stiffness.  Balance. This helps to lower your risk of falling.  Position changes or transfers, such as moving from sitting to standing or from a chair to a bed.  Coordination, such as getting an object from a shelf.  Muscle strength. Muscles may be strengthened with weights or by repeating certain motions.  Functional mobility. This may include stair training or learning how to use a wheelchair, walker, or cane.  Walking (gait training).  Activities of daily living, such as getting out of the car or buttoning a shirt. Why is physical therapy important? It is important to do exercises and follow your rehabilitation plan as told by your physical therapist. Physical therapy can:  Help you regain independence.  Prevent injury from falls by building strength and balance.  Lower your risk of blood clots.  Lower your risk of skin sores (pressure injuries).  Increase physical activity and exercise. This may help lower your risk for another stroke.  Help reduce pain. When will therapy start and where will I have therapy? Your health care provider will decide when it is best for you to start therapy. In some cases, people start rehabilitation, including physical therapy, as soon as they are medically stable, which may be 24-48 hours after a stroke. Rehabilitation can take place in a few different places, based on your needs. It may take place in:  The hospital or an in-patient rehabilitation hospital.  An outpatient rehabilitation facility.  A long-term care facility.  A community rehabilitation clinic.  Your home. What are assistive devices? Assistive devices are tools to help you move, maintain balance, and manage  daily tasks while recovering from a stroke. Your physical therapist may recommend and help you learn to use:  Equipment to help you move, such as wheelchairs, canes, or walkers.  Braces or splints to keep your arms, hands, legs, or feet in a comfortable and safe position.  Bathtub benches or grab bars to keep you safe in the bathroom.  Special utensils, bowls, and plates that allow you to eat with one hand. It is important to use these devices as told by your health care provider. Summary  After a stroke, some people may experience physical disabilities, such as weakness or paralysis, pain, or balance problems.  Physical therapy involves exercises, stretches, and activities that help to improve your ability to move and to handle daily tasks.  Physical therapy exercises focus on restoring range of motion, balance, coordination, muscle strength, and the ability to move (mobility).  Physical therapy can help you regain independence, prevent falls, and allow you to live a more active lifestyle after a stroke. This information is not intended to replace advice given to you by your health care provider. Make sure you discuss any questions you have with your health care provider.  Document Revised: 10/13/2018 Document Reviewed: 09/28/2016 Elsevier Patient Education  [place].

## 2020-05-15 NOTE — ED Notes (Signed)
Pt not in room to update vitals.

## 2020-05-15 NOTE — H&P (Addendum)
History and Physical    [name] HQP:[id number] DOB: Jan 11, 1956 DOA: 05/15/2020  PCP: [name], MD  Patient coming from: Home  I have personally briefly reviewed patient's old medical records in [place]  Chief Complaint: Tongue deviation, slurred speech  HPI: Rodney Adkins is a 64 y.o. male with medical history significant of MS, CHB s/p PPM, HOCM.  Pt presents to the ED with c/o slurred speech, R sided weakness, onset 8pm last night.  Saw neurology today in clinic, who promptly sent him here for MRI due to concern for MS flare vs stroke.  Symptoms persistent and unchanged.  No CP, no SOB, no abd pain.   ED Course: MRI shows: Acute 12 mm diffusion restricted lesion of the left corona radiata.  They favor demyelination over stroke.  Does also show chronic demyelinating disease.  Neurology consulted and hospitalist asked to admit.   Review of Systems: As per HPI, otherwise all review of systems negative.  Past Medical History:  Diagnosis Date  . Complete heart block [facility]) June 2009   a. Presented with hypotension/pulm edema 12/2007 when HOCM was also discovered. s/p dual chamber MDT pacemaker. Thallium stest 7/09 negative for infiltrative disease or ischemia.  Followed Dr [name]  Marland Kitchen Heart murmur   . HOCM (hypertrophic obstructive cardiomyopathy) ([place]) 2009   Discovered by ECHO when he developed CHB. Mgmt Dr Caryl Comes  and Dr [name] at [facility]. Has outflow obstruction on ECHO 2011  . Multiple sclerosis ([place]) 1998   Sxs started 1998 with L body numbness. MRI c/w MS. Started Avonex. Saw Dr [name] at [facility] and switched to Betaseron and was on for 5 yrs but developed depression and site rxns and D/C'd 2006. While on Betaseron, occassionally required solumedrol for flares.  . Pacemaker 2017  . Prostate enlargement   . Right bundle branch block   . SOB (shortness of breath)    a. PFTs 09/2010 essentially normal per pulm note (mild obst defect). b. CPST -  showed Wenckebach a/w exercise intolerance.  . 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 Pacer reprogrammed with improvement in sx.    Past Surgical History:  Procedure Laterality Date  . LEFT HEART CATHETERIZATION WITH CORONARY ANGIOGRAM N/A 08/22/2013   Procedure: LEFT HEART CATHETERIZATION WITH CORONARY ANGIOGRAM;  Surgeon: [name], MD;  Location[facility] CATH LAB;  Service: Cardiovascular;  Laterality: N/A;  . LEG TENDON SURGERY  2008   Os peroneus resection and peroneal tendon repair  . pace Agricultural engineer    . PACEMAKER INSERTION  12/2007   Complete heart block  . PACEMAKER LEAD REMOVAL Left 07/18/2015   Procedure: ATRIAL PACEMAKER LEAD EXTRACTION; ATRIAL PACEMAKER LEAD INSERTION; GENERATOR REPLACEMENT.;  Surgeon: [name], MD;  Location: [place];  Service: Cardiovascular;  Laterality: Left;  [name] to back up  . PERIPHERAL VASCULAR CATHETERIZATION N/A 02/12/2015   Procedure: Venogram;  Surgeon: [name], MD;  Location: [place] CV LAB;  Service: Cardiovascular;  Laterality: N/A;  . TEE WITHOUT CARDIOVERSION N/A 07/18/2015   Procedure: TRANSESOPHAGEAL ECHOCARDIOGRAM (TEE);  Surgeon: [name], MD;  Location: [place];  Service: Cardiovascular;  Laterality: N/A;     reports that he has never smoked. He has never used smokeless tobacco. He reports that he does not drink alcohol and does not use drugs.  No Known Allergies  Family History  Problem Relation Age of Onset  . Colon polyps Father   . Hypertension Father   . Diabetes Paternal Grandmother   . Heart disease Neg Hx      Prior to Admission medications   Medication Sig Start Date End Date Taking? Authorizing Provider  Multiple Vitamin (MULTIVITAMIN) tablet Take 1 tablet by mouth daily.    Yes [provider]  BAFIERTAM 95 MG CPDR Take 95 mg by mouth in the  morning and at bedtime. Patient not taking: Reported on 05/15/2020 04/24/20   [provider]  predniSONE (DELTASONE) 50 MG tablet Take 25 tablets (1,250 mg total) by mouth daily with breakfast for 5 days. 05/15/20 05/20/20  [name], MD    Physical Exam: Vitals:   05/15/20 1915 05/15/20 2113 05/15/20 2200 05/15/20 2215  BP: 128/81 139/75 134/73 124/82  Pulse: 63 71 66 67  Resp: 14 17 11 18   Temp:      TempSrc:      SpO2: 95% 98% 97% 97%    Constitutional: NAD, calm, comfortable Eyes: PERRL, lids and conjunctivae normal ENMT: Mucous membranes are moist. Posterior pharynx clear of any exudate or lesions.Normal dentition.  Neck: normal, supple, no masses, no thyromegaly Respiratory: clear to auscultation bilaterally, no wheezing, no crackles. Normal respiratory effort. No accessory muscle use.  Cardiovascular: Regular rate and rhythm, no murmurs / rubs / gallops. No extremity edema. 2+ pedal pulses. No carotid bruits.  Abdomen: no tenderness, no masses palpated. No hepatosplenomegaly. Bowel sounds positive.  Musculoskeletal: no clubbing / cyanosis. No joint deformity upper and lower extremities. Good ROM, no contractures. Normal muscle tone.  Skin: no rashes, lesions, ulcers. No induration Neurologic: Tongue deviation to R, left pharyngeal deviation. Psychiatric: Normal judgment and insight. Alert and oriented x 3. Normal mood.    Labs on Admission: I have personally reviewed following labs and imaging studies  CBC: Recent Labs  Lab 05/15/20 1047 05/15/20 1053 05/15/20 1947  WBC 6.7  --  7.5  NEUTROABS 4.6  --   --   HGB 15.4 15.3 14.8  HCT 46.1 45.0 44.0  MCV 91.3  --  89.8  PLT 222  --  532   Basic Metabolic Panel: Recent Labs  Lab 05/15/20 1047 05/15/20 1053 05/15/20 1947  NA 140 141  --   K 5.0 4.7  --   CL 105 105  --   CO2 28  --   --   GLUCOSE 106* 99  --   BUN 14 18  --   CREATININE 0.92 0.80 0.88  CALCIUM 9.9  --   --    GFR: CrCl  cannot be calculated (Unknown ideal weight.). Liver Function Tests: Recent Labs  Lab 05/15/20 1047  AST 24  ALT 21  ALKPHOS 76  BILITOT 0.9  PROT 6.8  ALBUMIN 4.1   No results for input(s): LIPASE, AMYLASE in the last 168 hours. No results for input(s): AMMONIA in the last 168 hours. Coagulation Profile: Recent Labs  Lab 05/15/20 1047  INR 1.1   Cardiac Enzymes: No results for input(s): CKTOTAL, CKMB, CKMBINDEX, TROPONINI in the last 168 hours. BNP (last 3 results) No results for input(s): PROBNP in the last 8760 hours. HbA1C: No results for input(s): HGBA1C in the last 72 hours. CBG: Recent Labs  Lab 05/15/20 1102  GLUCAP 105*   Lipid Profile: No results for input(s): CHOL, HDL, LDLCALC, TRIG, CHOLHDL, LDLDIRECT in the last 72 hours. Thyroid Function Tests: No  results for input(s): TSH, T4TOTAL, FREET4, T3FREE, THYROIDAB in the last 72 hours. Anemia Panel: No results for input(s): VITAMINB12, FOLATE, FERRITIN, TIBC, IRON, RETICCTPCT in the last 72 hours. Urine analysis:    Component Value Date/Time   BILIRUBINUR negative 01/16/2017 1034   BILIRUBINUR negative 01/11/2017 1006   KETONESUR negative 01/16/2017 1034   PROTEINUR negative 01/16/2017 1034   PROTEINUR 30 01/11/2017 1006   UROBILINOGEN 0.2 01/16/2017 1034   NITRITE Negative 01/16/2017 1034   NITRITE negative 01/11/2017 1006   LEUKOCYTESUR Trace (A) 01/16/2017 1034    Radiological Exams on Admission: DG Chest 2 View  Result Date: 05/15/2020 CLINICAL DATA:  History of MS. Slurred speech. EXAM: CHEST - 2 VIEW COMPARISON:  01/01/2016 FINDINGS: Left chest wall pacer device is noted with lead in the right atrial appendage and right ventricle. Heart size is normal. No pleural effusion or edema. Lungs are clear. No airspace densities. IMPRESSION: No active cardiopulmonary abnormalities. Electronically Signed   By: Kerby Moors M.D.   On: 05/15/2020 21:59   MR Brain W and Wo Contrast  Result Date:  05/15/2020 CLINICAL DATA:  64 year old male with multiple sclerosis, developed slurred speech last night at 2000 hours. Neurology considering medullary infarct versus MS exacerbation. Medtronic MRI conditional pacemaker and leads. The patient was scanned according to safety guidelines with no issues reported. EXAM: MRI HEAD WITHOUT AND WITH CONTRAST TECHNIQUE: Multiplanar, multiecho pulse sequences of the brain and surrounding structures were obtained without and with intravenous contrast. CONTRAST:  86mL GADAVIST GADOBUTROL 1 MMOL/ML IV SOLN COMPARISON:  Brain MRI 07/11/2018. FINDINGS: Brain: There is a round 12 mm area of restricted diffusion at the left posterior corona radiata tracking slightly inferiorly (series 3, image 33) associated with faint T2 and FLAIR hyperintensity (series 5, image 17). No hemorrhage or mass effect. Last year a small and inconspicuous area FLAIR hyperintensity was present at this same site (series 6, image 15 at that time). This lesion is faintly enhancing (series 12, image 13). No mass effect. No other convincing diffusion restriction. There are occasional areas of T2 shine through. No chronic cerebral blood products. Superimposed widely scattered nodular and frequently periventricular white matter T2 and FLAIR hyperintense lesions compatible with chronic multiple sclerosis. Comparing axial FLAIR sequences from last year, there is a 2nd new white matter lesion identified in the posterior left hemisphere also on series 5, image 17, subcortical white matter of the parietal lobe. Other white matter lesions appear stable and size and configuration. Occasional chronic cortical involvement (left parietal lobe series 5, image 22). Deep gray matter nuclei appear spared. There is chronic heterogeneous brainstem involvement including at the right lateral pons on series 7, image 9 today. The cerebellum remains relatively spared. No other abnormal intracranial enhancement identified. No the  midline shift, mass effect, evidence of mass lesion, ventriculomegaly, extra-axial collection or acute intracranial hemorrhage. Cervicomedullary junction and pituitary are within normal limits. Vascular: Major intracranial vascular flow voids are stable since last year, with dominant left and diminutive right distal vertebral arteries. The major dural venous sinuses are enhancing and appear to be patent. Skull and upper cervical spine: Visible cervical spine appears grossly normal for age. Visualized bone marrow signal is within normal limits. Sinuses/Orbits: Stable and negative. Other: Mastoids remain clear. Grossly normal visible internal auditory structures. Scalp and face appear negative. IMPRESSION: 1. Acute 12 mm diffusion restricted lesion of the left corona radiata. Faint enhancement, along with subtle FLAIR hyperintensity here last year, favor active demyelination rather than a small vessel  white matter infarct. No associated hemorrhage or mass effect. 2. Underlying chronic demyelinating disease with a 2nd small new white matter lesion in the left parietal lobe since last year, although no other active demyelination identified. Electronically Signed   By: Genevie Ann M.D.   On: 05/15/2020 15:10    EKG: Independently reviewed.  Assessment/Plan Principal Problem:   Acute focal neurological deficit Active Problems:   Multiple sclerosis (HCC)   Pacemaker-dual-chamber Medtronic    1. Acute focal neurologic deficits - 1. With MRI findings as described, DDx MS flare with active demyelination vs acute ischemic stroke 2. See neurology consult note 3. Plan is to repeat MRI with and without contrast of head, c.spine, and MRA head and neck tomorrow to differentiate between the two getting certain image sequences not obtained in the initial MRI series. 4. Treat as stroke for the moment 5. Stroke pathway 6. ASA/Plavix 7. A1C, FLP 8. PT/OT/SLP 9. Tele monitor 1. Interrogated pacer in ED: no A.Fib nor  A.Flutter for at least the past 1 year per that report. 10. TTE 2. H/o MS - not on any chronic treatment currently 3. PPM in place - MRI wont be able to be done until tomorrow.  DVT prophylaxis: Lovenox Code Status: Full Family Communication: Wife at bedside Disposition Plan: Home after admit Consults called: Neuro Dr. Theda Sers Admission status: Place in obs    [place], [place] Hospitalists  How to contact the [facility] Attending or Consulting provider [place] or covering provider during after hours [place], for this patient?  1. Check the care team in [facility] and look for a) attending/consulting [facility] provider listed and b) the [facility] team listed 2. Log into [email or web address]  Amion Physician Scheduling and messaging for groups and whole hospitals  On call and physician scheduling software for group practices, residents, hospitalists and other medical providers for call, clinic, rotation and shift schedules. OnCall Enterprise is a hospital-wide system for scheduling doctors and paging doctors on call. EasyPlot is for scientific plotting and data analysis.  [email or web address]  and use [place]'s universal password to access. If you do not have the password, please contact the hospital operator.  3. Locate the [facility] provider you are looking for under [facility] and page to a number that you can be directly reached. 4. If you still have difficulty reaching the provider, please page the [facility] (Director on Call) for the Hospitalists listed on amion for assistance.  05/15/2020, 10:33 PM

## 2020-05-15 NOTE — ED Provider Notes (Signed)
[facility] Emergency Department Provider Note MRN:  [id number]  Arrival date & time: 05/15/20     Chief Complaint   Speech disturbance History of Present Illness   Rodney Adkins is a 64 y.o. year-old male with a history of complete heart block status post pacemaker placement, history of MS presenting to the ED with chief complaint of speech disturbance.  Starting at about 8 PM last night patient is noticing new slurred speech, tongue deviation, increased weakness to the right side of the body.  Has a history of MS but this is beyond his baseline deficits.  Evaluated by neurology today and sent here for MRI.  Neurologist concern for either MS flare versus stroke.  Denies chest pain or shortness of breath, no abdominal pain, no recent illness, no other complaints.  Review of Systems  A complete 10 system review of systems was obtained and all systems are negative except as noted in the HPI and PMH.   Patient's Health History    Past Medical History:  Diagnosis Date  . Complete heart block [facility]) June 2009   a. Presented with hypotension/pulm edema 12/2007 when HOCM was also discovered. s/p dual chamber MDT pacemaker. Thallium stest 7/09 negative for infiltrative disease or ischemia.  Followed Dr [name]  Marland Kitchen Heart murmur   . HOCM (hypertrophic obstructive cardiomyopathy) ([place]) 2009   Discovered by ECHO when he developed CHB. Mgmt Dr Caryl Comes  and Dr [name] at [facility]. Has outflow obstruction on ECHO 2011  . Multiple sclerosis ([place]) 1998   Sxs started 1998 with L body numbness. MRI c/w MS. Started Avonex. Saw Dr [name] at [facility] and switched to Betaseron and was on for 5 yrs but developed depression and site rxns and D/C'd 2006. While on Betaseron, occassionally required solumedrol for flares.  . Pacemaker 2017  . Prostate enlargement   . Right bundle branch block   . SOB (shortness of breath)    a. PFTs 09/2010 essentially normal per pulm note (mild obst defect). b. CPST -  showed Wenckebach a/w exercise intolerance.  . 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 Pacer reprogrammed with improvement in sx.    Past Surgical History:  Procedure Laterality Date  . LEFT HEART CATHETERIZATION WITH CORONARY ANGIOGRAM N/A 08/22/2013   Procedure: LEFT HEART CATHETERIZATION WITH CORONARY ANGIOGRAM;  Surgeon: [name], MD;  Location: [facility] CATH LAB;  Service: Cardiovascular;  Laterality: N/A;  . LEG TENDON SURGERY  2008   Os peroneus resection and peroneal tendon repair  . pace Agricultural engineer    . PACEMAKER INSERTION  12/2007   Complete heart block  . PACEMAKER LEAD REMOVAL Left 07/18/2015   Procedure: ATRIAL PACEMAKER LEAD EXTRACTION; ATRIAL PACEMAKER LEAD INSERTION; GENERATOR REPLACEMENT.;  Surgeon: [name], MD;  Location: [place];  Service: Cardiovascular;  Laterality: Left;  [name] to back up  . PERIPHERAL VASCULAR CATHETERIZATION N/A 02/12/2015   Procedure: Venogram;  Surgeon: [name], MD;  Location: [place] CV LAB;  Service: Cardiovascular;  Laterality: N/A;  . TEE WITHOUT CARDIOVERSION N/A 07/18/2015   Procedure: TRANSESOPHAGEAL ECHOCARDIOGRAM (TEE);  Surgeon: [name], MD;  Location: [facility] OR;  Service: Cardiovascular;  Laterality: N/A;    Family History  Problem Relation Age of Onset  . Colon polyps Father   . Hypertension Father   . Diabetes Paternal  Grandmother   . Heart disease Neg Hx     Social History   Socioeconomic History  . Marital status: Married    Spouse name: Not on file  . Number of children: 0  . Years of education: Not on file  . Highest education level: Not on file  Occupational History  . Occupation: professor    Employer: [employer]    Comment: Statistics  Tobacco Use  . Smoking status: Never Smoker  . Smokeless tobacco: Never Used  Vaping Use  . Vaping Use: Never  used  Substance and Sexual Activity  . Alcohol use: No    Alcohol/week: 0.0 standard drinks  . Drug use: No  . Sexual activity: Not Currently  Other Topics Concern  . 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   Social Determinants of Health   Financial Resource Strain:   . Difficulty of Paying Living Expenses: Not on file  Food Insecurity:   . Worried About Charity fundraiser in the Last Year: Not on file  . Ran Out of Food in the Last Year: Not on file  Transportation Needs:   . Lack of Transportation (Medical): Not on file  . Lack of Transportation (Non-Medical): Not on file  Physical Activity:   . Days of Exercise per Week: Not on file  . Minutes of Exercise per Session: Not on file  Stress:   . Feeling of Stress : Not on file  Social Connections:   . Frequency of Communication with Friends and Family: Not on file  . Frequency of Social Gatherings with Friends and Family: Not on file  . Attends Religious Services: Not on file  . Active Member of Clubs or Organizations: Not on file  . Attends Archivist Meetings: Not on file  . Marital Status: Not on file  Intimate Partner Violence:   . Fear of Current or Ex-Partner: Not on file  . Emotionally Abused: Not on file  . Physically Abused: Not on file  . Sexually Abused: Not on file     Physical Exam   Vitals:   05/15/20 1530 05/15/20 1545  BP: (!) 112/56 120/68  Pulse: 65 72  Resp: 11 12  Temp:    SpO2: 100% 100%    CONSTITUTIONAL: Well-appearing, NAD NEURO:  Alert and oriented x 3, mild slurred speech EYES:  eyes equal and reactive ENT/NECK:  no LAD, no JVD CARDIO: Regular rate, well-perfused, normal S1 and S2 PULM:  CTAB no wheezing or rhonchi GI/GU:  normal bowel sounds, non-distended, non-tender MSK/SPINE:  No gross deformities, no edema SKIN:  no rash, atraumatic PSYCH:  Appropriate speech and behavior  *Additional and/or pertinent findings included  in MDM below  Diagnostic and Interventional Summary    EKG Interpretation  Date/Time:  Wednesday May 15 2020 10:41:52 EST Ventricular Rate:  70 PR Interval:  154 QRS Duration: 168 QT Interval:  470 QTC Calculation: 507 R Axis:   -87 Text Interpretation: Atrial-sensed ventricular-paced rhythm Abnormal ECG Confirmed by Gerlene Fee [phone]) on 05/15/2020 11:17:25 AM      Labs Reviewed  COMPREHENSIVE METABOLIC PANEL - Abnormal; Notable for the following components:      Result Value   Glucose, Bld 106 (*)    All other components within normal limits  CBG MONITORING, ED - Abnormal; Notable for the following components:   Glucose-Capillary 105 (*)    All other components within normal limits  PROTIME-INR  APTT  CBC  DIFFERENTIAL  I-STAT CHEM 8, ED    MR Brain W and Wo Contrast  Final Result      Medications  gadobutrol (GADAVIST) 1 MMOL/ML injection 6 mL (6 mLs Intravenous Contrast Given 05/15/20 1457)     Procedures  /  Critical Care Procedures  ED Course and Medical Decision Making  I have reviewed the triage vital signs, the nursing notes, and pertinent available records from the EMR.  Listed above are laboratory and imaging tests that I personally ordered, reviewed, and interpreted and then considered in my medical decision making (see below for details).  Question of stroke versus MS flare.  Patient has mostly subjective decreased strength to the right arm and right leg, he has evident tongue deviation to the right, and he has slurred speech which he says is worse than his baseline.  This would potentially localize a lesion to the left brainstem.  Will obtain MRI brain with and without contrast.  Patient is without visual field cuts, no aphasia, no neglect, no indication for code stroke initiation.     MRI revealing acute MS flare, no stroke.  To be evaluated by Dr. Theda Sers, suspect will discharge on prednisone versus admission and Solu-Medrol.  Sounded oncoming  provider shift change.  Barth Kirks. Sedonia Small, MD [place] [email or web address]  Final Clinical Impressions(s) / ED Diagnoses     ICD-10-CM   1. Multiple sclerosis with acute neurologic event [facility])  [place]     ED Discharge Orders         Ordered    predniSONE (DELTASONE) 50 MG tablet  Daily with breakfast        05/15/20 1642           Discharge Instructions Discussed with and Provided to Patient:       [name], MD 05/15/20 1643

## 2020-05-15 NOTE — ED Notes (Signed)
This RN was told by MRI that pt is unable to have MRI scan done today due to having a pacemaker; MRI technician stated that the RNs responsible for pacemaker-MRI patients have left for the day. States that pt will be taken for MRI tomorrow

## 2020-05-15 NOTE — Consult Note (Addendum)
Neurology Consult H&P  CC: tongue deviation  History is obtained from: patient and wife  HPI: Rodney Adkins is a 64 y.o. male PMHx reviewed below and history of MS presenting to the ED with chief complaint of speech disturbance. Patient noticing new slurred speech 2000 05/15/2020, tongue deviation. Has a history of MS and his outside neurology sent him here for MRI with concern for either MS flare versus stroke.   LKW: 2000 05/15/2020 tpa given?: No, see LKW IR Thrombectomy? No Modified Rankin Scale: 0-Completely asymptomatic and back to baseline post- stroke NIHSS: 2  ROS: A complete ROS was performed and is negative except as noted in the HPI.   Past Medical History:  Diagnosis Date  . Complete heart block [facility]) June 2009   a. Presented with hypotension/pulm edema 12/2007 when HOCM was also discovered. s/p dual chamber MDT pacemaker. Thallium stest 7/09 negative for infiltrative disease or ischemia.  Followed Dr [name]  Marland Kitchen Heart murmur   . HOCM (hypertrophic obstructive cardiomyopathy) ([place]) 2009   Discovered by ECHO when he developed CHB. Mgmt Dr Caryl Comes  and Dr [name] at [facility]. Has outflow obstruction on ECHO 2011  . Multiple sclerosis ([place]) 1998   Sxs started 1998 with L body numbness. MRI c/w MS. Started Avonex. Saw Dr [name] at [facility] and switched to Betaseron and was on for 5 yrs but developed depression and site rxns and D/C'd 2006. While on Betaseron, occassionally required solumedrol for flares.  . Pacemaker 2017  . Prostate enlargement   . Right bundle branch block   . SOB (shortness of breath)    a. PFTs 09/2010 essentially normal per pulm note (mild obst defect). b. 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  . 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 Pacer reprogrammed with improvement in sx.   Family History  Problem  Relation Age of Onset  . Colon polyps Father   . Hypertension Father   . Diabetes Paternal Grandmother   . Heart disease Neg Hx    Social History:  reports that he has never smoked. He has never used smokeless tobacco. He reports that he does not drink alcohol and does not use drugs.   Prior to Admission medications   Medication Sig Start Date End Date Taking? Authorizing Provider  Multiple Vitamin (MULTIVITAMIN) tablet Take 1 tablet by mouth daily.    Yes [provider]  BAFIERTAM 95 MG CPDR Take 95 mg by mouth in the morning and at bedtime. Patient not taking: Reported on 05/15/2020 04/24/20   [provider]  predniSONE (DELTASONE) 50 MG tablet Take 25 tablets (1,250 mg total) by mouth daily with breakfast for 5 days. 05/15/20 05/20/20  [name], MD   Exam: Current vital signs: BP 128/81   Pulse 63   Temp 97.6 F (36.4 C) (Oral)   Resp 14   SpO2 95%   Physical Exam  Constitutional: Appears well-developed and well-nourished.  Psych: Affect appropriate to situation Eyes: No scleral injection HENT: No OP obstrucion Head: Normocephalic.  Cardiovascular: Normal rate and regular rhythm.  Respiratory: Effort normal and breath sounds normal to anterior ascultation GI: Soft.  No distension. There is no tenderness.  Skin: WDI  Neuro: Mental Status: Patient is awake, alert, oriented to person, place, month, year, and situation. Patient is able to give a clear and coherent  history. No signs of aphasia or neglect. Visual Fields are full. Pupils are equal, round, and reactive to light. EOMI without ptosis or diploplia.  Facial sensation is symmetric to temperature Facial movement is symmetric.  Hearing is intact to voice Uvula deviates to left Shoulder shrug is symmetric. Tongue to right without atrophy or fasciculations.  Motor: Tone is normal. Bulk is normal. 5/5 strength was present in all four extremities. Sensory: Sensation is symmetric to light  touch and temperature in the arms and legs. Deep Tendon Reflexes: 2+ and symmetric in the biceps and patellae. Plantars: Toes are downgoing bilaterally. Cerebellar: FNF and HKS are intact bilaterally.  I have reviewed the images obtained: MRI brain showed ADC>>DWI left corona. Scattered nodular mostly periventricular increased T2 signal. Heterogeneous signal changes in brainstem.    Assessment: Rodney Adkins is a 64 y.o. male PMHx MS with tongue deviation to right and left pharyngeal deviation and outside neurologist concerned for medullary stroke. MS versus brainstem stroke and the requisite MRI sequences were not complete. universally known that MS also affects brainstem. Will also need to rule out vertebral/ICA dissection (Clin Med Res: 2012 Aug;10(3):127-30. Doi:10.3121/cmr.2011.1029/10.1177/[id number] and many more).    Plan: - MRI brain and cervical spine wo/w contrast. - Recommend vascular imaging with MRA head and neck. - Recommend TTE. - Recommend labs: HbA1c, lipid panel. - Recommend Statin if LDL > 70 - Telemetry monitoring for arrhythmia. - Recommend PT/OT/SLP consult. - Further evaluation by stroke service.  Electronically signed by: Dr. Lynnae Sandhoff Pager: [phone] 05/15/2020, 8:34 PM

## 2020-05-15 NOTE — ED Triage Notes (Signed)
Pt with hx of MS sent here by neurologist to rule out medullary stroke. Developed slurred speech last night at 8pm, which still remains. Pt A/O x 4.

## 2020-05-15 NOTE — ED Notes (Signed)
At MRI 

## 2020-05-16 ENCOUNTER — Encounter (HOSPITAL_COMMUNITY): Payer: Self-pay | Admitting: Internal Medicine

## 2020-05-16 ENCOUNTER — Observation Stay (HOSPITAL_COMMUNITY): Payer: BC Managed Care – PPO

## 2020-05-16 ENCOUNTER — Other Ambulatory Visit: Payer: Self-pay

## 2020-05-16 ENCOUNTER — Observation Stay (HOSPITAL_BASED_OUTPATIENT_CLINIC_OR_DEPARTMENT_OTHER): Payer: BC Managed Care – PPO

## 2020-05-16 DIAGNOSIS — I639 Cerebral infarction, unspecified: Secondary | ICD-10-CM | POA: Diagnosis present

## 2020-05-16 DIAGNOSIS — I6389 Other cerebral infarction: Secondary | ICD-10-CM | POA: Diagnosis not present

## 2020-05-16 DIAGNOSIS — Z950 Presence of cardiac pacemaker: Secondary | ICD-10-CM | POA: Diagnosis not present

## 2020-05-16 DIAGNOSIS — R29818 Other symptoms and signs involving the nervous system: Secondary | ICD-10-CM | POA: Diagnosis not present

## 2020-05-16 LAB — BASIC METABOLIC PANEL
Anion gap: 11 (ref 5–15)
BUN: 14 mg/dL (ref 8–23)
CO2: 24 mmol/L (ref 22–32)
Calcium: 9.5 mg/dL (ref 8.9–10.3)
Chloride: 106 mmol/L (ref 98–111)
Creatinine, Ser: 0.96 mg/dL (ref 0.61–1.24)
GFR, Estimated: >60 mL/min (ref >60)
Glucose, Bld: 90 mg/dL (ref 70–99)
Potassium: 3.7 mmol/L (ref 3.5–5.1)
Sodium: 141 mmol/L (ref 135–145)

## 2020-05-16 LAB — CBC
HCT: 43.3 % (ref 39.0–52.0)
Hemoglobin: 14.9 g/dL (ref 13.0–17.0)
MCH: 30.5 pg (ref 26.0–34.0)
MCHC: 34.4 g/dL (ref 30.0–36.0)
MCV: 88.5 fL (ref 80.0–100.0)
Platelets: 204 10*3/uL (ref 150–400)
RBC: 4.89 MIL/uL (ref 4.22–5.81)
RDW: 12.1 % (ref 11.5–15.5)
WBC: 8.8 10*3/uL (ref 4.0–10.5)
nRBC: 0 % (ref 0.0–0.2)

## 2020-05-16 LAB — HEMOGLOBIN A1C
Hgb A1c MFr Bld: 5.7 % — ABNORMAL HIGH (ref 4.8–5.6)
Mean Plasma Glucose: 116.89 mg/dL

## 2020-05-16 LAB — LIPID PANEL
Cholesterol: 170 mg/dL (ref 0–200)
HDL: 49 mg/dL (ref >40)
LDL Cholesterol: 103 mg/dL — ABNORMAL HIGH (ref 0–99)
Total CHOL/HDL Ratio: 3.5 RATIO
Triglycerides: 92 mg/dL (ref <150)
VLDL: 18 mg/dL (ref 0–40)

## 2020-05-16 LAB — ECHOCARDIOGRAM COMPLETE
Area-P 1/2: 3.93 cm2
Height: 70 in
S' Lateral: 2.5 cm
Weight: 2240 oz

## 2020-05-16 MED ORDER — GADOBUTROL 1 MMOL/ML IV SOLN
6.3000 mL | Freq: Once | INTRAVENOUS | Status: AC | PRN
  Administered 2020-05-16: 6.3 mL via INTRAVENOUS

## 2020-05-16 MED ORDER — PERFLUTREN LIPID MICROSPHERE
1.0000 mL | INTRAVENOUS | Status: DC | PRN
  Administered 2020-05-16: 3 mL via INTRAVENOUS
  Filled 2020-05-16: qty 10

## 2020-05-16 MED ORDER — ATORVASTATIN CALCIUM 40 MG PO TABS
40.0000 mg | ORAL_TABLET | Freq: Every day | ORAL | Status: DC
  Filled 2020-05-16: qty 1

## 2020-05-16 MED ORDER — LORAZEPAM 1 MG PO TABS
2.0000 mg | ORAL_TABLET | Freq: Once | ORAL | Status: AC | PRN
  Administered 2020-05-16: 2 mg via ORAL
  Filled 2020-05-16: qty 2

## 2020-05-16 NOTE — Evaluation (Signed)
Clinical/Bedside Swallow Evaluation Patient Details  Name: Rodney Adkins MRN: [id number] Date of Birth: 09-29-1955  Today's Date: 05/16/2020 Time: SLP Start Time (ACUTE ONLY): 4742 SLP Stop Time (ACUTE ONLY): 1120 SLP Time Calculation (min) (ACUTE ONLY): 31 min  Past Medical History:  Past Medical History:  Diagnosis Date  . Complete heart block [facility]) June 2009   a. Presented with hypotension/pulm edema 12/2007 when HOCM was also discovered. s/p dual chamber MDT pacemaker. Thallium stest 7/09 negative for infiltrative disease or ischemia.  Followed Dr [name]  Marland Kitchen Heart murmur   . HOCM (hypertrophic obstructive cardiomyopathy) ([place]) 2009   Discovered by ECHO when he developed CHB. Mgmt Dr Caryl Comes  and Dr [name] at [facility]. Has outflow obstruction on ECHO 2011  . Multiple sclerosis ([place]) 1998   Sxs started 1998 with L body numbness. MRI c/w MS. Started Avonex. Saw Dr [name] at [facility] and switched to Betaseron and was on for 5 yrs but developed depression and site rxns and D/C'd 2006. While on Betaseron, occassionally required solumedrol for flares.  . Pacemaker 2017  . Prostate enlargement   . Right bundle branch block   . SOB (shortness of breath)    a. PFTs 09/2010 essentially normal per pulm note (mild obst defect). b. 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  . 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 Pacer reprogrammed with improvement in sx.   Past Surgical History:  Past Surgical History:  Procedure Laterality Date  . LEFT HEART CATHETERIZATION WITH CORONARY ANGIOGRAM N/A 08/22/2013   Procedure: LEFT HEART CATHETERIZATION WITH CORONARY ANGIOGRAM;  Surgeon: [name], MD;  Location: [facility] CATH LAB;  Service: Cardiovascular;  Laterality: N/A;  . LEG TENDON SURGERY  2008   Os peroneus resection and peroneal tendon repair  . pace  Agricultural engineer    . PACEMAKER INSERTION  12/2007   Complete heart block  . PACEMAKER LEAD REMOVAL Left 07/18/2015   Procedure: ATRIAL PACEMAKER LEAD EXTRACTION; ATRIAL PACEMAKER LEAD INSERTION; GENERATOR REPLACEMENT.;  Surgeon: [name], MD;  Location: [place];  Service: Cardiovascular;  Laterality: Left;  [name] to back up  . PERIPHERAL VASCULAR CATHETERIZATION N/A 02/12/2015   Procedure: Venogram;  Surgeon: [name], MD;  Location: [place] CV LAB;  Service: Cardiovascular;  Laterality: N/A;  . TEE WITHOUT CARDIOVERSION N/A 07/18/2015   Procedure: TRANSESOPHAGEAL ECHOCARDIOGRAM (TEE);  Surgeon: [name], MD;  Location: [place];  Service: Cardiovascular;  Laterality: N/A;   HPI:  Pt presents to the ED on 05/15/20 with c/o slurred speech, R sided weakness, onset 05/15/20.  Neurology considering medullary infarct versus MS exacerbation; pt c/o dysarthric speech which has worsened since admission and Yale swallow screen failed which prompted BSE.  Assessment / Plan / Recommendation Clinical Impression  Pt presents with oropharyngeal dysphagia characterized by reduced right oral control/coordination, impaired mastication, oral holding (due to awareness of tongue impairment), and coughing noted with larger volumes of thin liquids.  Pt with right tongue deviation and moderately dysarthric speech with low intensity noted during conversational speech being 50% accurate without implementation of compensatory strategies to improve intensity overall.  With strategies in place for slow, deliberate, speech with improved breath support, pt improved intelligibility to 75% accurate during speaking tasks.  Weak cough noted during assessment, but pt was able to initiate a swallow without difficulty.  Pt aware of swallowing deficits and using independent compensatory strategies such as small bites and small sips during consumption.  Recommend Regular/thin liquid diet with swallowing precautions in place.  ST will  f/u for dysphagia tx while in acute setting.  Full SLE pending due to MRI tech arrived to transport pt to MRI after BSE completed and cognition/language were unable to be fully assessed. Thank you for this consult. SLP Visit Diagnosis: Dysphagia, oropharyngeal phase (R13.12)    Aspiration Risk  Mild aspiration risk    Diet Recommendation   Regular/thin liquids  Medication Administration: Other (Comment) (whole meds with liquids (using swallowing prec))    Other  Recommendations Oral Care Recommendations: Oral care BID   Follow up Recommendations Outpatient SLP      Frequency and Duration min 2x/week  1 week       Prognosis Prognosis for Safe Diet Advancement: Good      Swallow Study   General Date of Onset: 05/15/20 HPI: Pt presents to the ED with c/o slurred speech, R sided weakness, onset 8pm last night.  Saw neurology today in clinic, who promptly sent him here for MRI due to concern for MS flare vs stroke. Type of Study: Bedside Swallow Evaluation Previous Swallow Assessment: Yale; failed  Diet Prior to this Study: Regular;Thin liquids Temperature Spikes Noted: No Respiratory Status: Room air History of Recent Intubation: No Behavior/Cognition: Alert;Cooperative Oral Cavity Assessment: Within Functional Limits Oral Care Completed by SLP: No Oral Cavity - Dentition: Adequate natural dentition Vision: Functional for self-feeding Self-Feeding Abilities: Able to feed self Patient Positioning: Upright in chair Baseline Vocal Quality: Low vocal intensity Volitional Cough: Weak Volitional Swallow: Able to elicit    Oral/Motor/Sensory Function Overall Oral Motor/Sensory Function: Mild impairment Facial ROM: Within Functional Limits Facial Symmetry: Abnormal symmetry right;Other (Comment) (slight at rest) Facial Strength: Within Functional Limits Facial Sensation: Reduced right;Reduced left;Other (Comment) Lingual ROM: Reduced left (labial numbness) Lingual Symmetry:  Abnormal symmetry right Lingual Strength: Reduced Lingual Sensation: Within Functional Limits   Ice Chips Ice chips: Within functional limits Presentation: Self Fed   Thin Liquid Thin Liquid: Impaired Presentation: Cup Oral Phase Functional Implications: Oral holding;Other (comment) Pharyngeal  Phase Impairments: Cough - Immediate Other Comments: with larger boluses ONLY    Nectar Thick Nectar Thick Liquid: Not tested   Honey Thick Honey Thick Liquid: Not tested   Puree Puree: Within functional limits Presentation: Self Fed   Solid     Solid: Impaired Presentation: Self Fed Oral Phase Impairments: Impaired mastication Oral Phase Functional Implications: Prolonged oral transit;Impaired mastication Other Comments: Pt concerned about solid foods with tongue impairment      Elvina Sidle, M.S., CCC-SLP 05/16/2020,2:05 PM

## 2020-05-16 NOTE — Progress Notes (Addendum)
Pt stated he could not finish doing MRI scan (11mins left) unless he had more Ativan. Paged Dr. Doristine Bosworth to inform and he ordered 2mg  PO ativan for patient. Ativan given to patient at 1525, MRI is now proceeding.

## 2020-05-16 NOTE — Progress Notes (Signed)
Occupational Therapy Evaluation  PTA, pt independent and worked as a Arts administrator at [employer]. Pt with baseline gait abnormality and coordination deficits with R side from MS, however, pt states "it's worse". Pt currently requires min guard A for functional transfers for ADL and set up/S with ADL tasks to reduce risk of falls. Pt endorses 1 fall down his stairs prior to this episode. Due to deficits listed below, recommend pt follow up with OT at a neuro outpt center to maximize independence with ADL.IADL tasks and facilitate return to work. Given his decreased reaction time and decreased coordination with his RLE, recommend he refrain from driving at this time. Pt/wife verbalized understanding. Wife will be able to assist as needed after DC. Will follow acutely.     05/16/20 1000  OT Visit Information  Last OT Received On 05/16/20  Assistance Needed +1  History of Present Illness  64 y.o. male with medical history significant of MS, CHB s/p PPM, HOCM. Pt presents to the ED with c/o slurred speech, R sided weakness. MRI shows: Acute 12 mm diffusion restricted lesion of the left corona radiata.  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[employer]; Marine scientist Expressive difficulties (dysarthria)  Pain Assessment  Pain Assessment No/denies  pain  Cognition  Arousal/Alertness Awake/alert  Behavior During Therapy WFL for tasks assessed/performed  General Comments will further assess; decreased awareness of safety at times ; selective attention; may benefit from assessment with the Pill Box Test  Upper Extremity Assessment  Upper Extremity Assessment RUE deficits/detail  RUE Deficits / Details strength diminished with elbow extension, but functional; apparent sensroy motor deficits making fine motor skills and coordination more difficult, but functional; poor in-hand manipulation skills; pt states writing was difficult before due to his MS; "clumsy hand"  RUE Coordination decreased fine motor  Lower Extremity Assessment  Lower Extremity Assessment Defer to PT evaluation  Cervical / Trunk Assessment  Cervical / Trunk Assessment Normal  ADL  Overall ADL's  Needs assistance/impaired  Eating/Feeding Set up  Grooming Supervision/safety;Standing  Upper Body Bathing Set up;Sitting  Lower Body Bathing Min guard;Sit to/from stand  Upper Body Dressing  Set up;Sitting  Lower Body Dressing Min guard;Sit to/from Retail buyer Ambulation;Min guard  Toileting- Technical sales engineer;Sit to/from stand  Functional mobility during ADLs Min guard  General ADL Comments Began education regarding fall prevention. Pt with increased LOB, especially when distracted or durin gtransitional moements; recommend using 3in1 as shower chair to reduce risk of falls  Vision- History  Baseline Vision/History Wears glasses  Wears Glasses At all times  Patient Visual Report No change from baseline  Vision- Assessment  Vision Assessment? Yes  Eye Alignment WFL  Ocular Range of Motion [facility]  Alignment/Gaze Preference WDL  Tracking/Visual Pursuits Able to track stimulus in all quads without difficulty  Saccades [facility]  Convergence WFL  Visual Fields No apparent deficits  Additional Comments  no apparent deficits; no report  of visual cahnges  Perception  Comments WFL  Praxis  Praxis-Other Comments incoordiantion RU/LE  Bed Mobility  General bed mobility comments pt received and left in recliner, bed mobility not assessed  Transfers  Overall transfer level Needs assistance  Equipment used None  Transfers Sit to/from Stand  Sit to Stand Min guard  General transfer comment falling posteriorly at times  Balance  Overall balance assessment Needs assistance  Sitting-balance support No upper extremity supported;Feet supported  Sitting balance-Leahy Scale Good  Standing balance support No upper extremity supported  Standing balance-Leahy Scale Fair  Exercises  Exercises Other exercises  General Exercises - Lower Extremity  Long Arc Quad  (blue theraband)  Hip ABduction/ADduction  (blue theraband)  Hip Flexion/Marching  (blue theraband)  Other Exercises  Other Exercises theraputty ex began - pennies in putty; workingon in-hand manipulation skills  OT - End of Session  Equipment Utilized During Treatment Gait belt  Activity Tolerance Patient tolerated treatment well  Patient left in chair;with call bell/phone within reach;with chair alarm set  Nurse Communication Mobility status;Other (comment) (DC needs)  OT Assessment  OT Recommendation/Assessment Patient needs continued OT Services  OT Visit Diagnosis Unsteadiness on feet (R26.81);Other abnormalities of gait and mobility (R26.89);Muscle weakness (generalized) (M62.81);History of falling (Z91.81)  OT Problem List Decreased strength;Impaired balance (sitting and/or standing);Decreased coordination;Decreased safety awareness;Decreased knowledge of use of DME or AE;Impaired UE functional use  OT Plan  OT Frequency (ACUTE ONLY) Min 2X/week  OT Treatment/Interventions (ACUTE ONLY) Self-care/ADL training;Therapeutic exercise;Neuromuscular education;DME and/or AE instruction;Therapeutic activities;Patient/family education;Balance training  AM-PAC OT "6  Clicks" Daily Activity Outcome Measure (Version 2)  Help from another person eating meals? 3  Help from another person taking care of personal grooming? 3  Help from another person toileting, which includes using toliet, bedpan, or urinal? 3  Help from another person bathing (including washing, rinsing, drying)? 3  Help from another person to put on and taking off regular upper body clothing? 3  Help from another person to put on and taking off regular lower body clothing? 3  6 Click Score 18  OT Recommendation  Follow Up Recommendations Outpatient OT;Supervision/Assistance - 24 hour (neuro outpt OT; refrain from driving)  OT Equipment 3 in 1 bedside commode (to use as shower seat)  Individuals Consulted  Consulted and Agree with Results and Recommendations Patient;Family member/caregiver  Family Member Consulted wife  Acute Rehab OT Goals  Patient Stated Goal To return to independent mobility  OT Goal Formulation With patient  Time For Goal Achievement 05/30/20  Potential to Achieve Goals Good  OT Time Calculation  OT Start Time (ACUTE ONLY) 1017  OT Stop Time (ACUTE ONLY) 1045  OT Time Calculation (min) 28 min  OT General Charges  $OT Visit 1 Visit  OT Evaluation  $OT Eval Moderate Complexity 1 Mod  OT Treatments  $Self Care/Home Management  8-22 mins  Written Expression  Dominant Hand Right  Maurie Boettcher, OT/L   Acute OT Clinical Specialist [place] Pager [phone] Office [phone]

## 2020-05-16 NOTE — Progress Notes (Signed)
  Echocardiogram 2D Echocardiogram has been performed.  Jennette Dubin 05/16/2020, 8:40 AM

## 2020-05-16 NOTE — Progress Notes (Signed)
PROGRESS NOTE    Rodney Adkins  GBT:[id number] DOB: 31-Dec-1955 DOA: 05/15/2020 PCP: [name], MD   Brief Narrative:  HPI: Rodney Adkins is a 64 y.o. male with medical history significant of MS, CHB s/p PPM, HOCM.  Pt presents to the ED with c/o slurred speech, R sided weakness, onset 8pm last night.  Saw neurology today in clinic, who promptly sent him here for MRI due to concern for MS flare vs stroke.  Symptoms persistent and unchanged.  No CP, no SOB, no abd pain.   ED Course: MRI shows: Acute 12 mm diffusion restricted lesion of the left corona radiata.  They favor demyelination over stroke.  Does also show chronic demyelinating disease.  Neurology consulted and hospitalist asked to admit.   Assessment & Plan:   Principal Problem:   Acute focal neurological deficit Active Problems:   Multiple sclerosis (HCC)   Pacemaker-dual-chamber Medtronic  Dysarthria/right-sided weakness/strokelike symptoms with history of MS: MRI brain negative for any acute stroke.  Per neurology, highly suspecting MS flare.  Patient just returned from several MRIs.  Results still pending.  Per my discussion with patient's wife, neurology was thinking about starting high-dose steroids based on the results of the MRI.  Will defer to neurology.PT OT on board.  Patient on Plavix.  Hyperlipidemia: LDL 103.  We will start him on statin.  DVT prophylaxis: SCDs Start: 05/15/20 1941   Code Status: Full Code  Family Communication: Patient's wife present at bedside.  Plan of care discussed with patient in length and he verbalized understanding and agreed with it.  Status is: Observation  The patient will require care spanning > 2 midnights and should be moved to inpatient because: Inpatient level of care appropriate due to severity of illness  Dispo: The patient is from: Home              Anticipated d/c is to: Home              Anticipated d/c date is: 2 days               Patient currently is not medically stable to d/c.        Estimated body mass index is 20.09 kg/m as calculated from the following:   Height as of this encounter: 5\' 10"  (1.778 m).   Weight as of this encounter: 63.5 kg.      Nutritional status:               Consultants:   Neurology  Procedures:   None  Antimicrobials:  Anti-infectives (From admission, onward)   None         Subjective: Attempted to see patient today 3 times and all the times, he was down in radiology for scans and finally I was able to see him the first time.  His wife was at the bedside.  Patient just returned from MRI.  He received 80 went over there per his request.  He was slightly sleepy.  He still had dysarthria and slow speech.  No other focal deficit.  He was oriented x4.  Objective: Vitals:   05/16/20 0445 05/16/20 0645 05/16/20 0745 05/16/20 0845  BP: 124/75 114/68 124/80 124/80  Pulse: 80 72 70 70  Resp: 12 14  14   Temp: 98 F (36.7 C) 97.9 F (36.6 C) 97.8 F (36.6 C) 97.8 F (36.6 C)  TempSrc: Oral Oral Oral Oral  SpO2:  97% 98%   Weight:  Height:       No intake or output data in the 24 hours ending 05/16/20 1642 Filed Weights   2020/05/30 2330  Weight: 63.5 kg    Examination:  General exam: Appears calm and comfortable  Respiratory system: Clear to auscultation. Respiratory effort normal. Cardiovascular system: S1 & S2 heard, RRR. No JVD, murmurs, rubs, gallops or clicks. No pedal edema. Gastrointestinal system: Abdomen is nondistended, soft and nontender. No organomegaly or masses felt. Normal bowel sounds heard. Central nervous system: Alert and oriented. No focal neurological deficits.  Slurred speech. Extremities: Symmetric 5 x 5 power. Skin: No rashes, lesions or ulcers Psychiatry: Judgement and insight appear normal. Mood & affect appropriate.    Data Reviewed: I have personally reviewed following labs and imaging studies  CBC: Recent Labs    Lab 2020-05-30 1047 05/30/2020 1053 2020-05-30 1947 05/16/20 0308  WBC 6.7  --  7.5 8.8  NEUTROABS 4.6  --   --   --   HGB 15.4 15.3 14.8 14.9  HCT 46.1 45.0 44.0 43.3  MCV 91.3  --  89.8 88.5  PLT 222  --  198 654   Basic Metabolic Panel: Recent Labs  Lab 2020/05/30 1047 05-30-2020 1053 30-May-2020 1947 05/16/20 0308  NA 140 141  --  141  K 5.0 4.7  --  3.7  CL 105 105  --  106  CO2 28  --   --  24  GLUCOSE 106* 99  --  90  BUN 14 18  --  14  CREATININE 0.92 0.80 0.88 0.96  CALCIUM 9.9  --   --  9.5   GFR: Estimated Creatinine Clearance: 68.9 mL/min (by C-G formula based on SCr of 0.96 mg/dL). Liver Function Tests: Recent Labs  Lab 30-May-2020 1047  AST 24  ALT 21  ALKPHOS 76  BILITOT 0.9  PROT 6.8  ALBUMIN 4.1   No results for input(s): LIPASE, AMYLASE in the last 168 hours. No results for input(s): AMMONIA in the last 168 hours. Coagulation Profile: Recent Labs  Lab May 30, 2020 1047  INR 1.1   Cardiac Enzymes: No results for input(s): CKTOTAL, CKMB, CKMBINDEX, TROPONINI in the last 168 hours. BNP (last 3 results) No results for input(s): PROBNP in the last 8760 hours. HbA1C: Recent Labs    05/16/20 0308  HGBA1C 5.7*   CBG: Recent Labs  Lab 05/30/2020 1102  GLUCAP 105*   Lipid Profile: Recent Labs    05/16/20 0308  CHOL 170  HDL 49  LDLCALC 103*  TRIG 92  CHOLHDL 3.5   Thyroid Function Tests: No results for input(s): TSH, T4TOTAL, FREET4, T3FREE, THYROIDAB in the last 72 hours. Anemia Panel: No results for input(s): VITAMINB12, FOLATE, FERRITIN, TIBC, IRON, RETICCTPCT in the last 72 hours. Sepsis Labs: No results for input(s): PROCALCITON, LATICACIDVEN in the last 168 hours.  Recent Results (from the past 240 hour(s))  Respiratory Panel by RT PCR (Flu A&B, Covid) - Nasopharyngeal Swab     Status: None   Collection Time: 2020/05/30  6:19 PM   Specimen: Nasopharyngeal Swab  Result Value Ref Range Status   SARS Coronavirus 2 by RT PCR NEGATIVE NEGATIVE  Final    Comment: (NOTE) SARS-CoV-2 target nucleic acids are NOT DETECTED.  The SARS-CoV-2 RNA is generally detectable in upper respiratoy specimens during the acute phase of infection. The lowest concentration of SARS-CoV-2 viral copies this assay can detect is 131 copies/mL. A negative result does not preclude SARS-Cov-2 infection and should not be used as  the sole basis for treatment or other patient management decisions. A negative result may occur with  improper specimen collection/handling, submission of specimen other than nasopharyngeal swab, presence of viral mutation(s) within the areas targeted by this assay, and inadequate number of viral copies (<131 copies/mL). A negative result must be combined with clinical observations, patient history, and epidemiological information. The expected result is Negative.  Fact Sheet for Patients:  PinkCheek.be  Fact Sheet for Healthcare Providers:  GravelBags.it  This test is no t yet approved or cleared by the [place] FDA and  has been authorized for detection and/or diagnosis of SARS-CoV-2 by FDA under an Emergency Use Authorization (EUA). This EUA will remain  in effect (meaning this test can be used) for the duration of the COVID-19 declaration under Section 564(b)(1) of the Act, 21 U.S.C. section 360bbb-3(b)(1), unless the authorization is terminated or revoked sooner.     Influenza A by PCR NEGATIVE NEGATIVE Final   Influenza B by PCR NEGATIVE NEGATIVE Final    Comment: (NOTE) The Xpert Xpress SARS-CoV-2/FLU/RSV assay is intended as an aid in  the diagnosis of influenza from Nasopharyngeal swab specimens and  should not be used as a sole basis for treatment. Nasal washings and  aspirates are unacceptable for Xpert Xpress SARS-CoV-2/FLU/RSV  testing.  Fact Sheet for Patients: PinkCheek.be  Fact Sheet for Healthcare  Providers: GravelBags.it  This test is not yet approved or cleared by the [place] FDA and  has been authorized for detection and/or diagnosis of SARS-CoV-2 by  FDA under an Emergency Use Authorization (EUA). This EUA will remain  in effect (meaning this test can be used) for the duration of the  Covid-19 declaration under Section 564(b)(1) of the Act, 21  U.S.C. section 360bbb-3(b)(1), unless the authorization is  terminated or revoked. Performed at [facility], [place] [address] [place], [place] [address]       Radiology Studies: DG Chest 2 View  Result Date: 05/15/2020 CLINICAL DATA:  History of MS. Slurred speech. EXAM: CHEST - 2 VIEW COMPARISON:  01/01/2016 FINDINGS: Left chest wall pacer device is noted with lead in the right atrial appendage and right ventricle. Heart size is normal. No pleural effusion or edema. Lungs are clear. No airspace densities. IMPRESSION: No active cardiopulmonary abnormalities. Electronically Signed   By: Kerby Moors M.D.   On: 05/15/2020 21:59   MR ANGIO HEAD WO CONTRAST  Result Date: 05/16/2020 CLINICAL DATA:  Acute presentation with slurred speech and right-sided weakness yesterday. History of multiple sclerosis. Question multiple sclerosis versus stroke. EXAM: MRI HEAD WITHOUT CONTRAST MRA HEAD WITHOUT CONTRAST MRA NECK WITHOUT CONTRAST TECHNIQUE: Multiplanar, multiecho pulse sequences of the brain and surrounding structures were obtained without intravenous contrast. Angiographic images of the Circle of Willis were obtained using MRA technique without intravenous contrast. Angiographic images of the neck were obtained using MRA technique without intravenous contrast. Carotid stenosis measurements (when applicable) are obtained utilizing NASCET criteria, using the distal internal carotid diameter as the denominator. COMPARISON:  05/15/2020.  07/11/2018. FINDINGS: MRI HEAD FINDINGS Brain: Previously seen  demyelinating focus within the right side of the pons is stable. Cerebellar atrophy without focal cerebellar finding. Cerebral hemispheres show bilateral foci of T2 and FLAIR signal within the deep and subcortical white matter with a small focus of cortical involvement at the left parietal vertex. These are all unchanged since yesterday. The single exception to this is the focus of restricted diffusion in the deep white matter adjacent to the posterior  body of the left lateral ventricle. This looks the same on diffusion imaging, measuring 12 mm, but appears larger on the T2 and FLAIR images. Because of that rapid change, ischemic infarction is favored. Demyelinating disease in an extremely active phase could possibly change this quickly. Low level contrast enhancement occurs in that location, similar to yesterday's study, possibly slightly more pronounced. Vascular: Major vessels at the base of the brain show flow. Skull and upper cervical spine: Negative Sinuses/Orbits: Clear/normal Other: None MRA HEAD FINDINGS Both internal carotid arteries are widely patent through the siphon region. The anterior and middle cerebral vessels are normal without proximal stenosis, aneurysm or vascular malformation. Both vertebral arteries are patent. The right is a small vessel that terminates in PICA. The left vertebral artery supplies the basilar. No basilar stenosis. Superior cerebellar and posterior cerebral arteries appear normal. MRA NECK FINDINGS Aortic arch not included in the study. Both common carotid arteries widely patent to the bifurcation. Both carotid bifurcations appear normal. Both cervical internal carotid arteries are normal. Both vertebral artery origins are widely patent. Left vertebral artery is dominant. The right vertebral artery is a small vessel that terminates in PICA. Left vertebral artery appears normal through the cervical region to the foramen magnum. IMPRESSION: 1. Multiple foci of T2 and FLAIR  signal within the brain as outlined above are stable since yesterday's exam. The single exception to this is the focus of restricted diffusion in the deep white matter adjacent to the posterior body of the left lateral ventricle. This looks the same on diffusion imaging, but appears slightly larger on the T2 and FLAIR images. Low level contrast enhancement occurs in that location, similar to yesterday's study, possibly slightly more pronounced. Given the rapid change on FLAIR and T2 imaging, ischemic infarction would seem more likely. However, demyelinating disease in an extremely active phase could give this appearance rapid change as well. 2. Intracranial MR angiography  is normal. 3. MR angiography of the neck vessels is normal. Electronically Signed   By: Nelson Chimes M.D.   On: 05/16/2020 16:36   MR ANGIO NECK WO CONTRAST  Result Date: 05/16/2020 CLINICAL DATA:  Acute presentation with slurred speech and right-sided weakness yesterday. History of multiple sclerosis. Question multiple sclerosis versus stroke. EXAM: MRI HEAD WITHOUT CONTRAST MRA HEAD WITHOUT CONTRAST MRA NECK WITHOUT CONTRAST TECHNIQUE: Multiplanar, multiecho pulse sequences of the brain and surrounding structures were obtained without intravenous contrast. Angiographic images of the Circle of Willis were obtained using MRA technique without intravenous contrast. Angiographic images of the neck were obtained using MRA technique without intravenous contrast. Carotid stenosis measurements (when applicable) are obtained utilizing NASCET criteria, using the distal internal carotid diameter as the denominator. COMPARISON:  05/15/2020.  07/11/2018. FINDINGS: MRI HEAD FINDINGS Brain: Previously seen demyelinating focus within the right side of the pons is stable. Cerebellar atrophy without focal cerebellar finding. Cerebral hemispheres show bilateral foci of T2 and FLAIR signal within the deep and subcortical white matter with a small focus of  cortical involvement at the left parietal vertex. These are all unchanged since yesterday. The single exception to this is the focus of restricted diffusion in the deep white matter adjacent to the posterior body of the left lateral ventricle. This looks the same on diffusion imaging, measuring 12 mm, but appears larger on the T2 and FLAIR images. Because of that rapid change, ischemic infarction is favored. Demyelinating disease in an extremely active phase could possibly change this quickly. Low level contrast enhancement occurs in that  location, similar to yesterday's study, possibly slightly more pronounced. Vascular: Major vessels at the base of the brain show flow. Skull and upper cervical spine: Negative Sinuses/Orbits: Clear/normal Other: None MRA HEAD FINDINGS Both internal carotid arteries are widely patent through the siphon region. The anterior and middle cerebral vessels are normal without proximal stenosis, aneurysm or vascular malformation. Both vertebral arteries are patent. The right is a small vessel that terminates in PICA. The left vertebral artery supplies the basilar. No basilar stenosis. Superior cerebellar and posterior cerebral arteries appear normal. MRA NECK FINDINGS Aortic arch not included in the study. Both common carotid arteries widely patent to the bifurcation. Both carotid bifurcations appear normal. Both cervical internal carotid arteries are normal. Both vertebral artery origins are widely patent. Left vertebral artery is dominant. The right vertebral artery is a small vessel that terminates in PICA. Left vertebral artery appears normal through the cervical region to the foramen magnum. IMPRESSION: 1. Multiple foci of T2 and FLAIR signal within the brain as outlined above are stable since yesterday's exam. The single exception to this is the focus of restricted diffusion in the deep white matter adjacent to the posterior body of the left lateral ventricle. This looks the same on  diffusion imaging, but appears slightly larger on the T2 and FLAIR images. Low level contrast enhancement occurs in that location, similar to yesterday's study, possibly slightly more pronounced. Given the rapid change on FLAIR and T2 imaging, ischemic infarction would seem more likely. However, demyelinating disease in an extremely active phase could give this appearance rapid change as well. 2. Intracranial MR angiography  is normal. 3. MR angiography of the neck vessels is normal. Electronically Signed   By: Nelson Chimes M.D.   On: 05/16/2020 16:36   MR BRAIN W WO CONTRAST  Result Date: 05/16/2020 CLINICAL DATA:  Acute presentation with slurred speech and right-sided weakness yesterday. History of multiple sclerosis. Question multiple sclerosis versus stroke. EXAM: MRI HEAD WITHOUT CONTRAST MRA HEAD WITHOUT CONTRAST MRA NECK WITHOUT CONTRAST TECHNIQUE: Multiplanar, multiecho pulse sequences of the brain and surrounding structures were obtained without intravenous contrast. Angiographic images of the Circle of Willis were obtained using MRA technique without intravenous contrast. Angiographic images of the neck were obtained using MRA technique without intravenous contrast. Carotid stenosis measurements (when applicable) are obtained utilizing NASCET criteria, using the distal internal carotid diameter as the denominator. COMPARISON:  05/15/2020.  07/11/2018. FINDINGS: MRI HEAD FINDINGS Brain: Previously seen demyelinating focus within the right side of the pons is stable. Cerebellar atrophy without focal cerebellar finding. Cerebral hemispheres show bilateral foci of T2 and FLAIR signal within the deep and subcortical white matter with a small focus of cortical involvement at the left parietal vertex. These are all unchanged since yesterday. The single exception to this is the focus of restricted diffusion in the deep white matter adjacent to the posterior body of the left lateral ventricle. This looks the  same on diffusion imaging, measuring 12 mm, but appears larger on the T2 and FLAIR images. Because of that rapid change, ischemic infarction is favored. Demyelinating disease in an extremely active phase could possibly change this quickly. Low level contrast enhancement occurs in that location, similar to yesterday's study, possibly slightly more pronounced. Vascular: Major vessels at the base of the brain show flow. Skull and upper cervical spine: Negative Sinuses/Orbits: Clear/normal Other: None MRA HEAD FINDINGS Both internal carotid arteries are widely patent through the siphon region. The anterior and middle cerebral vessels are normal without  proximal stenosis, aneurysm or vascular malformation. Both vertebral arteries are patent. The right is a small vessel that terminates in PICA. The left vertebral artery supplies the basilar. No basilar stenosis. Superior cerebellar and posterior cerebral arteries appear normal. MRA NECK FINDINGS Aortic arch not included in the study. Both common carotid arteries widely patent to the bifurcation. Both carotid bifurcations appear normal. Both cervical internal carotid arteries are normal. Both vertebral artery origins are widely patent. Left vertebral artery is dominant. The right vertebral artery is a small vessel that terminates in PICA. Left vertebral artery appears normal through the cervical region to the foramen magnum. IMPRESSION: 1. Multiple foci of T2 and FLAIR signal within the brain as outlined above are stable since yesterday's exam. The single exception to this is the focus of restricted diffusion in the deep white matter adjacent to the posterior body of the left lateral ventricle. This looks the same on diffusion imaging, but appears slightly larger on the T2 and FLAIR images. Low level contrast enhancement occurs in that location, similar to yesterday's study, possibly slightly more pronounced. Given the rapid change on FLAIR and T2 imaging, ischemic  infarction would seem more likely. However, demyelinating disease in an extremely active phase could give this appearance rapid change as well. 2. Intracranial MR angiography  is normal. 3. MR angiography of the neck vessels is normal. Electronically Signed   By: Nelson Chimes M.D.   On: 05/16/2020 16:36   MR Brain W and Wo Contrast  Result Date: 05/15/2020 CLINICAL DATA:  64 year old male with multiple sclerosis, developed slurred speech last night at 2000 hours. Neurology considering medullary infarct versus MS exacerbation. Medtronic MRI conditional pacemaker and leads. The patient was scanned according to safety guidelines with no issues reported. EXAM: MRI HEAD WITHOUT AND WITH CONTRAST TECHNIQUE: Multiplanar, multiecho pulse sequences of the brain and surrounding structures were obtained without and with intravenous contrast. CONTRAST:  63mL GADAVIST GADOBUTROL 1 MMOL/ML IV SOLN COMPARISON:  Brain MRI 07/11/2018. FINDINGS: Brain: There is a round 12 mm area of restricted diffusion at the left posterior corona radiata tracking slightly inferiorly (series 3, image 33) associated with faint T2 and FLAIR hyperintensity (series 5, image 17). No hemorrhage or mass effect. Last year a small and inconspicuous area FLAIR hyperintensity was present at this same site (series 6, image 15 at that time). This lesion is faintly enhancing (series 12, image 13). No mass effect. No other convincing diffusion restriction. There are occasional areas of T2 shine through. No chronic cerebral blood products. Superimposed widely scattered nodular and frequently periventricular white matter T2 and FLAIR hyperintense lesions compatible with chronic multiple sclerosis. Comparing axial FLAIR sequences from last year, there is a 2nd new white matter lesion identified in the posterior left hemisphere also on series 5, image 17, subcortical white matter of the parietal lobe. Other white matter lesions appear stable and size and  configuration. Occasional chronic cortical involvement (left parietal lobe series 5, image 22). Deep gray matter nuclei appear spared. There is chronic heterogeneous brainstem involvement including at the right lateral pons on series 7, image 9 today. The cerebellum remains relatively spared. No other abnormal intracranial enhancement identified. No the midline shift, mass effect, evidence of mass lesion, ventriculomegaly, extra-axial collection or acute intracranial hemorrhage. Cervicomedullary junction and pituitary are within normal limits. Vascular: Major intracranial vascular flow voids are stable since last year, with dominant left and diminutive right distal vertebral arteries. The major dural venous sinuses are enhancing and appear to be patent.  Skull and upper cervical spine: Visible cervical spine appears grossly normal for age. Visualized bone marrow signal is within normal limits. Sinuses/Orbits: Stable and negative. Other: Mastoids remain clear. Grossly normal visible internal auditory structures. Scalp and face appear negative. IMPRESSION: 1. Acute 12 mm diffusion restricted lesion of the left corona radiata. Faint enhancement, along with subtle FLAIR hyperintensity here last year, favor active demyelination rather than a small vessel white matter infarct. No associated hemorrhage or mass effect. 2. Underlying chronic demyelinating disease with a 2nd small new white matter lesion in the left parietal lobe since last year, although no other active demyelination identified. Electronically Signed   By: Genevie Ann M.D.   On: 05/15/2020 15:10   MR CERVICAL SPINE W WO CONTRAST  Result Date: 05/16/2020 CLINICAL DATA:  Acute presentation with slurred speech and right-sided weakness. Question secondary to multiple sclerosis or stroke. EXAM: MRI CERVICAL SPINE WITHOUT AND WITH CONTRAST TECHNIQUE: Multiplanar and multiecho pulse sequences of the cervical spine, to include the craniocervical junction and  cervicothoracic junction, were obtained without and with intravenous contrast. CONTRAST:  6.69mL GADAVIST GADOBUTROL 1 MMOL/ML IV SOLN COMPARISON:  No previous cervical imaging. FINDINGS: The study suffers from motion degradation. Alignment: Normal Vertebrae: No fracture or primary bone lesion. Cord: Question of abnormal signal in the right side of the cord at the C4 level. Because of the motion degradation, I cannot be certain of this finding. If real, it could be a focus of demyelination. No abnormal contrast enhancement. Posterior Fossa, vertebral arteries, paraspinal tissues: See results of brain MRI. Disc levels: Foramen magnum is widely patent. Ordinary mild osteoarthritis at the C1-2 articulation. C2-3: Normal interspace. C3-4: Spondylosis with endplate osteophytes and bulging of the disc. No compressive canal stenosis. Bilateral bony foraminal narrowing that could affect either C4 nerve. C4-5: Endplate osteophytes and bulging of the disc. No compressive central canal stenosis. Bilateral bony foraminal narrowing that could affect either C5 nerve. C5-6: Endplate osteophytes and bulging of the disc. Narrowing of the ventral subarachnoid space but no compression of the cord. Mild bilateral bony foraminal narrowing with some potential to affect either C6 nerve. C6-7: Small endplate osteophytes and minor bulging of the disc. No canal stenosis. Mild bilateral foraminal narrowing. C7-T1: Minimal uncovertebral prominence. No significant narrowing of the canal or foramina. IMPRESSION: 1. Motion degraded study. Question of abnormal signal in the right side of the cord at the C4 level. Because of the motion degradation, I cannot be certain of this finding. If real, it could be a focus of demyelination. No abnormal contrast enhancement. 2. Degenerative spondylosis throughout the cervical spine. No compressive canal stenosis. Bilateral foraminal narrowing at C3-4, C4-5 and C5-6 that could affect the C4, C5 and/or C6  nerves. Electronically Signed   By: Nelson Chimes M.D.   On: 05/16/2020 16:21   ECHOCARDIOGRAM COMPLETE  Result Date: 05/16/2020    ECHOCARDIOGRAM REPORT   Patient Name:   Rodney Adkins Date of Exam: 05/16/2020 Medical Rec #:  [id number]        Height:       70.0 in Accession #:    [id number]       Weight:       140.0 lb Date of Birth:  [birth year]        BSA:          1.794 m Patient Age:    57 years         BP:  124/80 mmHg Patient Gender: M                HR:           74 bpm. Exam Location:  Inpatient Procedure: 2D Echo and Intracardiac Opacification Agent Indications:    Stroke  History:        Patient has prior history of Echocardiogram examinations, most                 recent 10/29/2015. HOCM; Pacemaker.  Sonographer:    Mikki Santee RDCS (AE) Referring Phys: [place]  1. Left ventricular ejection fraction, by estimation, is 60 to 65%. The left ventricle has normal function. The left ventricle has no regional wall motion abnormalities. There is moderate focal basal septal left ventricular hypertrophy. Peak LV outflow tract gradient 39 mmHg with Valsalva. There was mitral valve systolic anterior motion seen best in the apical views. Left ventricular diastolic parameters are consistent with Grade I diastolic dysfunction (impaired relaxation).  2. Right ventricular systolic function is normal. The right ventricular size is normal. There is normal pulmonary artery systolic pressure. The estimated right ventricular systolic pressure is 07.3 mmHg.  3. The aortic valve is tricuspid. Aortic valve regurgitation is not visualized. No aortic stenosis is present.  4. The mitral valve is abnormal with systolic anterior motion noted. Trivial mitral valve regurgitation. No evidence of mitral stenosis.  5. The inferior vena cava is normal in size with greater than 50% respiratory variability, suggesting right atrial pressure of 3 mmHg.  6. Findings consistent with hypertrophic  obstructive cardiomyopathy. FINDINGS  Left Ventricle: Left ventricular ejection fraction, by estimation, is 60 to 65%. The left ventricle has normal function. The left ventricle has no regional wall motion abnormalities. Definity contrast agent was given IV to delineate the left ventricular  endocardial borders. The left ventricular internal cavity size was normal in size. There is moderate focal basal septal left ventricular hypertrophy. Left ventricular diastolic parameters are consistent with Grade I diastolic dysfunction (impaired relaxation). Right Ventricle: The right ventricular size is normal. No increase in right ventricular wall thickness. Right ventricular systolic function is normal. There is normal pulmonary artery systolic pressure. The tricuspid regurgitant velocity is 2.04 m/s, and  with an assumed right atrial pressure of 3 mmHg, the estimated right ventricular systolic pressure is 71.0 mmHg. Left Atrium: Left atrial size was normal in size. Right Atrium: Right atrial size was normal in size. Pericardium: There is no evidence of pericardial effusion. Mitral Valve: The mitral valve is abnormal. Trivial mitral valve regurgitation. No evidence of mitral valve stenosis. Tricuspid Valve: The tricuspid valve is normal in structure. Tricuspid valve regurgitation is trivial. Aortic Valve: The aortic valve is tricuspid. Aortic valve regurgitation is not visualized. No aortic stenosis is present. Pulmonic Valve: The pulmonic valve was normal in structure. Pulmonic valve regurgitation is not visualized. Aorta: The aortic root is normal in size and structure. Venous: The inferior vena cava is normal in size with greater than 50% respiratory variability, suggesting right atrial pressure of 3 mmHg. IAS/Shunts: No atrial level shunt detected by color flow Doppler. Additional Comments: A pacer wire is visualized in the right ventricle.  LEFT VENTRICLE PLAX 2D LVIDd:         3.96 cm  Diastology LVIDs:         2.50  cm  LV e' medial:    6.74 cm/s LV PW:         1.45 cm  LV  E/e' medial:  11.4 LV IVS:        1.55 cm  LV e' lateral:   7.40 cm/s LVOT diam:     1.80 cm  LV E/e' lateral: 10.4 LVOT Area:     2.54 cm  RIGHT VENTRICLE RV S prime:     10.20 cm/s TAPSE (M-mode): 1.9 cm LEFT ATRIUM           Index       RIGHT ATRIUM          Index LA diam:      3.40 cm 1.90 cm/m  RA Area:     8.74 cm LA Vol (A2C): 51.2 ml 28.54 ml/m RA Volume:   15.00 ml 8.36 ml/m LA Vol (A4C): 31.7 ml 17.67 ml/m   AORTA Ao Root diam: 2.50 cm MITRAL VALVE               TRICUSPID VALVE MV Area (PHT): 3.93 cm    TR Peak grad:   16.6 mmHg MV Decel Time: 193 msec    TR Vmax:        204.00 cm/s MV E velocity: 77.10 cm/s MV A velocity: 89.30 cm/s  SHUNTS MV E/A ratio:  0.86        Systemic Diam: 1.80 cm Loralie Champagne MD Electronically signed by Loralie Champagne MD Signature Date/Time: 05/16/2020/1:45:10 PM    Final     Scheduled Meds: . aspirin EC  81 mg Oral Daily  . clopidogrel  75 mg Oral Daily  . multivitamin with minerals  1 tablet Oral Daily   Continuous Infusions:   LOS: 0 days   Time spent: 38 minutes   Darliss Cheney, MD [facility]  05/16/2020, 4:42 PM   To contact the attending provider between 7A-7P or the covering provider during after hours 7P-7A, please log into the web site [email or web address].

## 2020-05-16 NOTE — Evaluation (Signed)
Physical Therapy Evaluation Patient Details Name: Rodney Adkins MRN: [id number] DOB: [birth year] Today's Date: 05/16/2020   History of Present Illness   64 y.o. male with medical history significant of MS, CHB s/p PPM, HOCM. Pt presents to the ED with c/o slurred speech, R sided weakness. MRI shows: Acute 12 mm diffusion restricted lesion of the left corona radiata.  Clinical Impression  Pt presents to PT with deficits in functional mobility, gait, strength, power, balance. Pt with R sided weakness and R foot drag during ambulation. Pt does experience loss of balance when distracted and remains at an increased risk of falling due to his R sided deficits. PT provides education on HEP for RLE strengthening and endurance improvement. Pt will benefit from aggressive mobilization and PT POC to improve gait quality and aide in a return to independence. PT recommends outpatient neuro rehab at the time of discharge as well as a cane.    Follow Up Recommendations Outpatient PT;Supervision - Intermittent (preferably neuro outpatient)    Equipment Recommendations  Cane    Recommendations for Other Services       Precautions / Restrictions Precautions Precautions: Fall Restrictions Weight Bearing Restrictions: No      Mobility  Bed Mobility               General bed mobility comments: pt received and left in recliner, bed mobility not assessed    Transfers Overall transfer level: Needs assistance Equipment used: None Transfers: Sit to/from Stand Sit to Stand: Supervision            Ambulation/Gait Ambulation/Gait assistance: Min assist;Min guard Gait Distance (Feet): 150 Feet Assistive device: Straight cane;None (cane for 50') Gait Pattern/deviations: Step-to pattern;Decreased step length - right;Decreased stance time - right;Decreased step length - left;Decreased weight shift to right;Decreased dorsiflexion - right Gait velocity: reduced Gait velocity interpretation:  <1.31 ft/sec, indicative of household ambulator General Gait Details: pt with intermittent R foot drag, reduced stance time on RLE. Pt with one loss of balance with distraction when turning requiring minA to correct  Stairs Stairs: Yes Stairs assistance: Supervision Stair Management: One rail Left;Step to pattern;Alternating pattern Number of Stairs: 2 General stair comments: 2 steps x3 sets, PT cues for sequencing however pt prefers to alternate steps  Wheelchair Mobility    Modified Rankin (Stroke Patients Only) Modified Rankin (Stroke Patients Only) Pre-Morbid Rankin Score: No symptoms Modified Rankin: Moderately severe disability     Balance Overall balance assessment: Needs assistance Sitting-balance support: No upper extremity supported;Feet supported Sitting balance-Leahy Scale: Good     Standing balance support: No upper extremity supported Standing balance-Leahy Scale: Fair                               Pertinent Vitals/Pain Pain Assessment: No/denies pain    Home Living Family/patient expects to be discharged to:: Private residence Living Arrangements: Spouse/significant other Available Help at Discharge: Family;Available PRN/intermittently Type of Home: House Home Access: Stairs to enter Entrance Stairs-Rails: Left Entrance Stairs-Number of Steps: 6 Home Layout: One level Home Equipment: None      Prior Function Level of Independence: Independent         Comments: works as a Arts administrator at [employer]: Right    Extremity/Trunk Assessment   Upper Extremity Assessment Upper Extremity Assessment: RUE deficits/detail RUE Deficits / Details: grossly 4+/5    Lower Extremity Assessment Lower Extremity  Assessment: RLE deficits/detail RLE Deficits / Details: grossly 4/5    Cervical / Trunk Assessment Cervical / Trunk Assessment: Normal  Communication   Communication:  (dysarthria)  Cognition  Arousal/Alertness: Awake/alert Behavior During Therapy: WFL for tasks assessed/performed Overall Cognitive Status: Within Functional Limits for tasks assessed                                        General Comments General comments (skin integrity, edema, etc.): VSS on RA    Exercises General Exercises - Lower Extremity Ankle Circles/Pumps: AROM;Right;10 reps Long Arc Quad: AROM;Right;10 reps (blue theraband) Hip ABduction/ADduction: AROM;Both;10 reps (blue theraband) Hip Flexion/Marching: AROM;Right;10 reps (blue theraband)   Assessment/Plan    PT Assessment Patient needs continued PT services  PT Problem List Decreased strength;Decreased activity tolerance;Decreased balance;Decreased mobility;Decreased knowledge of use of DME       PT Treatment Interventions DME instruction;Gait training;Stair training;Functional mobility training;Therapeutic activities;Therapeutic exercise;Balance training;Neuromuscular re-education;Patient/family education    PT Goals (Current goals can be found in the Care Plan section)  Acute Rehab PT Goals Patient Stated Goal: To return to independent mobility PT Goal Formulation: With patient Time For Goal Achievement: 05/30/20 Potential to Achieve Goals: Good Additional Goals Additional Goal #1: Pt will score >19/24 on DGI to indicate a reduced risk for falls    Frequency Min 4X/week   Barriers to discharge        Co-evaluation               AM-PAC PT "6 Clicks" Mobility  Outcome Measure Help needed turning from your back to your side while in a flat bed without using bedrails?: None Help needed moving from lying on your back to sitting on the side of a flat bed without using bedrails?: None Help needed moving to and from a bed to a chair (including a wheelchair)?: None Help needed standing up from a chair using your arms (e.g., wheelchair or bedside chair)?: None Help needed to walk in hospital room?: A Little Help  needed climbing 3-5 steps with a railing? : None 6 Click Score: 23    End of Session   Activity Tolerance: Patient tolerated treatment well Patient left: in chair;with call bell/phone within reach;with chair alarm set Nurse Communication: Mobility status PT Visit Diagnosis: Unsteadiness on feet (R26.81);Other abnormalities of gait and mobility (R26.89);Muscle weakness (generalized) (M62.81);Other symptoms and signs involving the nervous system (R29.898)    Time: 7116-5790 PT Time Calculation (min) (ACUTE ONLY): 38 min   Charges:   PT Evaluation $PT Eval Moderate Complexity: 1 Mod PT Treatments $Therapeutic Exercise: 8-22 mins        Zenaida Niece, PT, DPT Acute Rehabilitation Pager: [phone]   Zenaida Niece 05/16/2020, 10:04 AM

## 2020-05-16 NOTE — Progress Notes (Signed)
STROKE TEAM PROGRESS NOTE   INTERVAL HISTORY His wife is at the bedside.  Presented with speech difficulty - got a little worse over time but not significantly. I personally reviewed history of presenting illness with the patient and his wife, electronic medical records and imaging films in PACS.  He has longstanding history of multiple sclerosis and is currently followed by Dr. Pamalee Leyden at [facility] neurology but patient and wife would prefer referral to [facility] specialist in [place] now.Marland Kitchen  He is currently not on any active medication for MS but wife feels that he has been having some intermittent symptoms particular in the last several months has had some balance problems and this time he came in with speech difficulties worsened over an hour or so.  MRI scan does show left periventricular diffusion positive lesion which also shows some faint enhancement.Pt noted that he started to have slurred speech which progressed over an hour.  LDL cholesterol is 103 mg percent and hemoglobin A1c 5.7.  Echocardiogram is pending.  MRI scan of the brain was repeated with contrast for specific concerns about suspected brainstem infarct due to his presentation with tongue deviation and left palatal weakness however is negative for brainstem stroke.  MRA of the brain and neck is both normal.  MRI scan cervical spine shows ill-defined spinal cord hyperintensity at C4 and possibly at C2 as well which may be remote age demyelinating plaques.  No enhancing lesions are noted in the spinal cord.  Vitals:   05/16/20 0246 05/16/20 0445 05/16/20 0645 05/16/20 0745  BP: 132/81 124/75 114/68 124/80  Pulse: 87 80 72 70  Resp: 18 12 14    Temp: 98.5 F (36.9 C) 98 F (36.7 C) 97.9 F (36.6 C) 97.8 F (36.6 C)  TempSrc: Oral Oral Oral Oral  SpO2: 97%  97% 98%  Weight:      Height:       CBC:  Recent Labs  Lab 05/15/20 1047 05/15/20 1053 05/15/20 1947 05/16/20 0308  WBC 6.7   < > 7.5 8.8  NEUTROABS 4.6  --   --   --    HGB 15.4   < > 14.8 14.9  HCT 46.1   < > 44.0 43.3  MCV 91.3   < > 89.8 88.5  PLT 222   < > 198 204   < > = values in this interval not displayed.   Basic Metabolic Panel:  Recent Labs  Lab 05/15/20 1047 05/15/20 1047 05/15/20 1053 05/15/20 1053 05/15/20 1947 05/16/20 0308  NA 140   < > 141  --   --  141  K 5.0   < > 4.7  --   --  3.7  CL 105   < > 105  --   --  106  CO2 28  --   --   --   --  24  GLUCOSE 106*   < > 99  --   --  90  BUN 14   < > 18  --   --  14  CREATININE 0.92   < > 0.80   < > 0.88 0.96  CALCIUM 9.9  --   --   --   --  9.5   < > = values in this interval not displayed.   Lipid Panel:  Recent Labs  Lab 05/16/20 0308  CHOL 170  TRIG 92  HDL 49  CHOLHDL 3.5  VLDL 18  LDLCALC 103*   HgbA1c:  Recent Labs  Lab  05/16/20 0308  HGBA1C 5.7*   Urine Drug Screen: No results for input(s): LABOPIA, COCAINSCRNUR, LABBENZ, AMPHETMU, THCU, LABBARB in the last 168 hours.  Alcohol Level No results for input(s): ETH in the last 168 hours.  IMAGING past 24 hours DG Chest 2 View  Result Date: 05/15/2020 CLINICAL DATA:  History of MS. Slurred speech. EXAM: CHEST - 2 VIEW COMPARISON:  01/01/2016 FINDINGS: Left chest wall pacer device is noted with lead in the right atrial appendage and right ventricle. Heart size is normal. No pleural effusion or edema. Lungs are clear. No airspace densities. IMPRESSION: No active cardiopulmonary abnormalities. Electronically Signed   By: Kerby Moors M.D.   On: 05/15/2020 21:59   MR Brain W and Wo Contrast  Result Date: 05/15/2020 CLINICAL DATA:  64 year old male with multiple sclerosis, developed slurred speech last night at 2000 hours. Neurology considering medullary infarct versus MS exacerbation. Medtronic MRI conditional pacemaker and leads. The patient was scanned according to safety guidelines with no issues reported. EXAM: MRI HEAD WITHOUT AND WITH CONTRAST TECHNIQUE: Multiplanar, multiecho pulse sequences of the brain and  surrounding structures were obtained without and with intravenous contrast. CONTRAST:  25mL GADAVIST GADOBUTROL 1 MMOL/ML IV SOLN COMPARISON:  Brain MRI 07/11/2018. FINDINGS: Brain: There is a round 12 mm area of restricted diffusion at the left posterior corona radiata tracking slightly inferiorly (series 3, image 33) associated with faint T2 and FLAIR hyperintensity (series 5, image 17). No hemorrhage or mass effect. Last year a small and inconspicuous area FLAIR hyperintensity was present at this same site (series 6, image 15 at that time). This lesion is faintly enhancing (series 12, image 13). No mass effect. No other convincing diffusion restriction. There are occasional areas of T2 shine through. No chronic cerebral blood products. Superimposed widely scattered nodular and frequently periventricular white matter T2 and FLAIR hyperintense lesions compatible with chronic multiple sclerosis. Comparing axial FLAIR sequences from last year, there is a 2nd new white matter lesion identified in the posterior left hemisphere also on series 5, image 17, subcortical white matter of the parietal lobe. Other white matter lesions appear stable and size and configuration. Occasional chronic cortical involvement (left parietal lobe series 5, image 22). Deep gray matter nuclei appear spared. There is chronic heterogeneous brainstem involvement including at the right lateral pons on series 7, image 9 today. The cerebellum remains relatively spared. No other abnormal intracranial enhancement identified. No the midline shift, mass effect, evidence of mass lesion, ventriculomegaly, extra-axial collection or acute intracranial hemorrhage. Cervicomedullary junction and pituitary are within normal limits. Vascular: Major intracranial vascular flow voids are stable since last year, with dominant left and diminutive right distal vertebral arteries. The major dural venous sinuses are enhancing and appear to be patent. Skull and upper  cervical spine: Visible cervical spine appears grossly normal for age. Visualized bone marrow signal is within normal limits. Sinuses/Orbits: Stable and negative. Other: Mastoids remain clear. Grossly normal visible internal auditory structures. Scalp and face appear negative. IMPRESSION: 1. Acute 12 mm diffusion restricted lesion of the left corona radiata. Faint enhancement, along with subtle FLAIR hyperintensity here last year, favor active demyelination rather than a small vessel white matter infarct. No associated hemorrhage or mass effect. 2. Underlying chronic demyelinating disease with a 2nd small new white matter lesion in the left parietal lobe since last year, although no other active demyelination identified. Electronically Signed   By: Genevie Ann M.D.   On: 05/15/2020 15:10    PHYSICAL EXAM Pleasant middle-age  Caucasian male not in distress.  Sitting up comfortably in bedside chair. . Afebrile. Head is nontraumatic. Neck is supple without bruit.    Cardiac exam no murmur or gallop. Lungs are clear to auscultation. Distal pulses are well felt. Neurological Exam : Awake alert oriented to time place and person.  Speech is dysarthric and can be understood with some difficulty.  Voice is also hypophonic.  Extraocular movements are full range without nystagmus.  Blinks to threat bilaterally.  Fundi not visualized.  Moderate left facial weakness with slight weakness of eye closure and forehead frowning.  Asymmetrical palatal elevation with slight weakness on the left.  Tongue deviates when protruded to the right but he is able to move to the left full range.  [place] Tech is brisk.  Motor system exam ASSESSMENT/PLAN [name] is a 64 y.o. male with history of multiple sclerosis, CHB w/ pacer, HOCM,  presenting with new slurred speech.    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  MS exacerbation is less likely due  to absence of accompanying symptoms or multiple enhancing lesions in the brain or spinal cord.  Clinical presentation with tongue deviation to the right and some left palatal weakness is puzzling but no structural lesion in the brainstem has been found on to MRIs to explain this.    Exacerbation in Aug, resolved over 48h  Not on maintenance meds  MRI w/w/o L corona radiata DWI abnormality which shows early enhancement which is atypical for lacunar stroke Repeat MRI w/w/o w/ thin slices brainstem shows no evidence of additional brainstem infarcts or enhancement in the spinal cord or elsewhere to suggest MS flareup  MRA head & neck no evidence of large vessel stenosis, occlusion or dissection  MR CS mild degenerative changes.  Ill-defined spinal cord hyperintensity at C4 and possibly at C2 may represent remote age inactive demyelinating plaques  2D Echo EF 60-65%. No source of embolus. Hypertrophic cardiomyopathy   LDL 103  HgbA1c 5.7  VTE prophylaxis - SCDs   No antithrombotic prior to admission, now on aspirin 81 mg daily and clopidogrel 75 mg daily.   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  Recommend aspirin Plavix for 3 weeks followed by aspirin alone and aggressive risk factor modification.  Statin for elevated lipids.  Follow-up with outpatient multiple sclerosis neurologist for definitive treatment for that condition.  Patient wants referral locally hence will refer to Dr. Felecia Shelling in [place] for  the same.  Long discussion with patient and wife and answered questions.  Greater than 50% time during this 35-minute visit was spent in counseling and coordination of care about his presentation and discussion of imaging findings discussion about stroke and multiple sclerosis and answering questions.  Discussed with Dr. Doristine Bosworth.  Stroke team will sign off.  Kindly call for questions. Antony Contras, MD To contact Stroke Continuity provider, please refer to [email or web address] After hours, contact  General Neurology

## 2020-05-16 NOTE — Progress Notes (Signed)
Per order, changed device settings for MRI to  DOO at 85 bpm  Will program device back to pre-MRI settings after completion of exam and send transmission.

## 2020-05-17 DIAGNOSIS — Z950 Presence of cardiac pacemaker: Secondary | ICD-10-CM | POA: Diagnosis not present

## 2020-05-17 MED ORDER — ASPIRIN 81 MG PO TBEC: 81.0000 mg | DELAYED_RELEASE_TABLET | Freq: Every day | ORAL | 0 refills | Status: AC

## 2020-05-17 MED ORDER — ATORVASTATIN CALCIUM 40 MG PO TABS: 40.0000 mg | ORAL_TABLET | Freq: Every day | ORAL | 0 refills | Status: DC

## 2020-05-17 MED ORDER — CLOPIDOGREL BISULFATE 75 MG PO TABS: 75.0000 mg | ORAL_TABLET | Freq: Every day | ORAL | 0 refills | Status: AC

## 2020-05-17 NOTE — Plan of Care (Signed)

## 2020-05-17 NOTE — Evaluation (Signed)
Speech Language Pathology Evaluation Patient Details Name: Rodney Adkins MRN: [id number] DOB: Dec 11, 1955 Today's Date: 05/17/2020 Time: 1105-1130 SLP Time Calculation (min) (ACUTE ONLY): 25 min  Problem List:  Patient Active Problem List   Diagnosis Date Noted  . Stroke ([place]) 05/16/2020  . Acute focal neurological deficit 05/15/2020  . BPH (benign prostatic hyperplasia) 02/02/2016  . Fractured atrial pacemaker lead wire 07/18/2015  . 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 Chest pain 08/02/2013  . SOB (shortness of breath) 08/02/2013  . PVC (premature ventricular contraction) 02/13/2013  . Chest pain on exertion 02/25/2012  . Pacemaker-dual-chamber Medtronic 04/29/2011  . Change in bowel habits 01/22/2011  . Weight loss 01/22/2011  . COUGH 09/01/2010  . Multiple sclerosis ([place])   . HOCM (hypertrophic obstructive cardiomyopathy) ([place])   . Right bundle branch block   . Complete heart block ([place]) 12/05/2007   Past Medical History:  Past Medical History:  Diagnosis Date  . Complete heart block [facility]) June 2009   a. Presented with hypotension/pulm edema 12/2007 when HOCM was also discovered. s/p dual chamber MDT pacemaker. Thallium stest 7/09 negative for infiltrative disease or ischemia.  Followed Dr [name]  Marland Kitchen Heart murmur   . HOCM (hypertrophic obstructive cardiomyopathy) ([place]) 2009   Discovered by ECHO when he developed CHB. Mgmt Dr Caryl Comes  and Dr [name] at [facility]. Has outflow obstruction on ECHO 2011  . Multiple sclerosis ([place]) 1998   Sxs started 1998 with L body numbness. MRI c/w MS. Started Avonex. Saw Dr [name] at [facility] and switched to Betaseron and was on for 5 yrs but developed depression and site rxns and D/C'd 2006. While on Betaseron, occassionally required solumedrol for flares.  . Pacemaker 2017  . Prostate enlargement   . Right bundle branch block   . SOB (shortness of breath)    a. PFTs 09/2010 essentially normal per pulm note (mild obst defect). b. CPST - showed  Wenckebach a/w exercise intolerance.  . 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 Pacer reprogrammed with improvement in sx.   Past Surgical History:  Past Surgical History:  Procedure Laterality Date  . LEFT HEART CATHETERIZATION WITH CORONARY ANGIOGRAM N/A 08/22/2013   Procedure: LEFT HEART CATHETERIZATION WITH CORONARY ANGIOGRAM;  Surgeon: [name], MD;  Location: [facility] CATH LAB;  Service: Cardiovascular;  Laterality: N/A;  . LEG TENDON SURGERY  2008   Os peroneus resection and peroneal tendon repair  . pace Agricultural engineer    . PACEMAKER INSERTION  12/2007   Complete heart block  . PACEMAKER LEAD REMOVAL Left 07/18/2015   Procedure: ATRIAL PACEMAKER LEAD EXTRACTION; ATRIAL PACEMAKER LEAD INSERTION; GENERATOR REPLACEMENT.;  Surgeon: [name], MD;  Location: [place];  Service: Cardiovascular;  Laterality: Left;  [name] to back up  . PERIPHERAL VASCULAR CATHETERIZATION N/A 02/12/2015   Procedure: Venogram;  Surgeon: [name], MD;  Location: [place] CV LAB;  Service: Cardiovascular;  Laterality: N/A;  . TEE WITHOUT CARDIOVERSION N/A 07/18/2015   Procedure: TRANSESOPHAGEAL ECHOCARDIOGRAM (TEE);  Surgeon: [name], MD;  Location: [place];  Service: Cardiovascular;  Laterality: N/A;   HPI:  Pt presents to the ED on 05/15/20 with c/o slurred speech, R sided weakness, onset 05/15/20.  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  He also recalled 4/5 words without assistance from SLP.  This is different from prior level of functioning per wife.  He is able to state precautions required with fall/swallowing needs, but exhibits impulsivity and does not always implement precautions when required.  Speech is approximately 50-75% intelligible within words-conversation with dysarthric speech noted and right tongue deviation resulting in decreased coordination/imprecise articulation.  ST will f/u while in acute setting for cognitive/speech needs; recommend outpatient ST for targeted goals.  Thank you for this consult.    SLP Assessment  SLP Recommendation/Assessment: Patient needs continued Speech Language Pathology Services SLP Visit Diagnosis: Dysarthria and anarthria (R47.1);Attention and concentration deficit Attention and concentration deficit following: Cerebral infarction    Follow Up Recommendations  Outpatient SLP    Frequency and Duration min 2x/week  1 week      SLP Evaluation Cognition  Overall Cognitive Status: Impaired/Different from baseline Arousal/Alertness: Awake/alert Orientation Level: Oriented X4 Attention: Sustained Sustained Attention: Impaired Sustained Attention Impairment: Verbal complex;Functional complex Memory: Impaired Memory Impairment: Retrieval deficit Immediate Memory Recall: Sock;Blue;Bed Memory Recall Sock: Without Cue Memory Recall Blue: Without Cue Memory Recall Bed: With Cue Awareness: Impaired Awareness Impairment: Intellectual impairment Problem Solving: Appears intact Safety/Judgment: Impaired Comments:  (Able to state precautions but implementation decreased)       Comprehension  Auditory Comprehension Overall Auditory Comprehension: Appears within functional limits for tasks assessed Yes/No Questions: Within Functional Limits Commands: Within Functional Limits Conversation: Complex Visual  Recognition/Discrimination Discrimination: Within Function Limits Reading Comprehension Reading Status: Within funtional limits    Expression Expression Primary Mode of Expression: Verbal Verbal Expression Overall Verbal Expression: Appears within functional limits for tasks assessed Initiation: No impairment Level of Generative/Spontaneous Verbalization: Conversation Repetition: No impairment Naming: No impairment Pragmatics: No impairment Non-Verbal Means of Communication: Not applicable Written Expression Dominant Hand: Right Written Expression: Exceptions to [facility] Interfering Components: Utilizes non-dominant hand   Oral / Motor  Oral Motor/Sensory Function Overall Oral Motor/Sensory Function: Mild impairment Facial ROM: Reduced right;Other (Comment) Facial Symmetry: Abnormal symmetry right (slight) Facial Strength: Within Functional Limits Facial Sensation: Reduced right Lingual ROM: Reduced right Lingual Symmetry: Abnormal symmetry right Lingual Strength: Within Functional Limits Lingual Sensation: Reduced Motor Speech Overall Motor Speech: Appears within functional limits for tasks assessed Respiration: Impaired Level of Impairment: Sentence Phonation: Normal Resonance: Within functional limits Articulation: Impaired Level of Impairment: Phrase Intelligibility: Intelligibility reduced Word: 50-74% accurate Phrase: 50-74% accurate Sentence: 50-74% accurate Conversation: 50-74% accurate Motor Planning: Witnin functional limits Motor Speech Errors: Not applicable Effective Techniques: Slow rate;Increased vocal intensity;Over-articulate;Pause                       Elvina Sidle, M.S., CCC-SLP 05/17/2020, 12:10 PM

## 2020-05-17 NOTE — Progress Notes (Signed)
Physical Therapy Treatment Patient Details Name: Rodney Adkins MRN: [id number] DOB: Oct 24, 1955 Today's Date: 05/17/2020    History of Present Illness  64 y.o. male with medical history significant of MS, CHB s/p PPM, HOCM. Pt presents to the ED with c/o slurred speech, R sided weakness. MRI shows: Acute 12 mm diffusion restricted lesion of the left corona radiata.    PT Comments    Pt tolerates treatment well with improved activity and ambulation tolerance. Pt demonstrates improved R foot clearance with ambulation and improved stability with UE support of RW. Pt does continue to attempt alternating steps at times, PT encourages step-to pattern as pt demonstrates improved stability and reduced falls risk. PT provides education on plan for outpatient PT and HEP the pt can perform prior to starting outpatient therapies. Pt will continue to benefit from acute PT POC during this admission to reduce falls risk and restore independence. PT continues to recommend outpatient PT and a RW.   Follow Up Recommendations  Outpatient PT;Supervision - Intermittent (preferably outpatient neuro)     Equipment Recommendations  Rolling walker with 5" wheels    Recommendations for Other Services       Precautions / Restrictions Precautions Precautions: Fall Restrictions Weight Bearing Restrictions: No    Mobility  Bed Mobility Overal bed mobility: Modified Independent                Transfers Overall transfer level: Needs assistance Equipment used: None Transfers: Sit to/from Stand Sit to Stand: Supervision            Ambulation/Gait Ambulation/Gait assistance: Supervision;Min assist (minA with cane, supervision with RW) Gait Distance (Feet): 250 Feet (175' with cane, 75 with RW) Assistive device: Rolling walker (2 wheeled);Straight cane Gait Pattern/deviations: Step-to pattern Gait velocity: reduced Gait velocity interpretation: <1.8 ft/sec, indicate of risk for recurrent  falls General Gait Details: pt with shortened step-to gait, increased drift with one lateral LOB with use of cane, pt with improved stability with use of RW without LOB noted   Stairs Stairs: Yes Stairs assistance: Min guard;Supervision Stair Management: One rail Left;Step to pattern;Alternating pattern Number of Stairs: 10 General stair comments: pt with intermittent alternating pattern when ascending steps, PT encourages step-to pattern as pt has increased difficulty clearing R foot with alternating pattern. Pt with step-to pattern when descending   Wheelchair Mobility    Modified Rankin (Stroke Patients Only) Modified Rankin (Stroke Patients Only) Pre-Morbid Rankin Score: No symptoms Modified Rankin: Moderately severe disability     Balance Overall balance assessment: Needs assistance Sitting-balance support: No upper extremity supported;Feet supported Sitting balance-Leahy Scale: Good     Standing balance support: Single extremity supported;No upper extremity supported Standing balance-Leahy Scale: Fair Standing balance comment: supervision to button pants in standing                            Cognition Arousal/Alertness: Awake/alert Behavior During Therapy: WFL for tasks assessed/performed Overall Cognitive Status: Within Functional Limits for tasks assessed                                        Exercises      General Comments General comments (skin integrity, edema, etc.): VSS on RA      Pertinent Vitals/Pain Pain Assessment: No/denies pain    Home Living  Prior Function            PT Goals (current goals can now be found in the care plan section) Acute Rehab PT Goals Patient Stated Goal: To return to independent mobility Progress towards PT goals: Progressing toward goals    Frequency    Min 4X/week      PT Plan Current plan remains appropriate    Co-evaluation               AM-PAC PT "6 Clicks" Mobility   Outcome Measure  Help needed turning from your back to your side while in a flat bed without using bedrails?: None Help needed moving from lying on your back to sitting on the side of a flat bed without using bedrails?: None Help needed moving to and from a bed to a chair (including a wheelchair)?: None Help needed standing up from a chair using your arms (e.g., wheelchair or bedside chair)?: None Help needed to walk in hospital room?: None Help needed climbing 3-5 steps with a railing? : A Little 6 Click Score: 23    End of Session   Activity Tolerance: Patient tolerated treatment well Patient left: in bed;with call bell/phone within reach;with family/visitor present Nurse Communication: Mobility status PT Visit Diagnosis: Unsteadiness on feet (R26.81);Other abnormalities of gait and mobility (R26.89);Muscle weakness (generalized) (M62.81);Other symptoms and signs involving the nervous system (R29.898)     Time: 7741-4239 PT Time Calculation (min) (ACUTE ONLY): 35 min  Charges:  $Gait Training: 8-22 mins $Therapeutic Activity: 8-22 mins                     Zenaida Niece, PT, DPT Acute Rehabilitation Pager: [phone]    Zenaida Niece 05/17/2020, 10:27 AM

## 2020-05-17 NOTE — TOC Transition Note (Signed)
Transition of Care [facility]) - CM/SW Discharge Note   Patient Details  Name: Rodney Adkins MRN: [id number] Date of Birth: 1956-07-05  Transition of Care [facility]) CM/SW Contact:  Pollie Friar, RN Phone Number: 05/17/2020, 10:25 AM   Clinical Narrative:    Pt is discharging home with spouse and will participate in outpatient therapy. CM met with pt's spouse yesterday and [facility] decided on. Information on the AVS. [employer] will deliver walker and 3 in 1 to the room for home.  Wife to provide transport home.   Final next level of care: OP Rehab Barriers to Discharge: No Barriers Identified   Patient Goals and CMS Choice   CMS Medicare.gov Compare Post Acute Care list provided to:: Patient Represenative (must comment) Choice offered to / list presented to : Spouse  Discharge Placement                       Discharge Plan and Services                DME Arranged: 3-N-1, Walker rolling DME Agency: [employer] Date DME Agency Contacted: 05/17/20   Representative spoke with at DME Agency: Haywood Lasso            Social Determinants of Health ([place]) Interventions     Readmission Risk Interventions No flowsheet data found.

## 2020-05-17 NOTE — Discharge Summary (Signed)
Physician Discharge Summary  Rodney Adkins FMB:[id number] DOB: 07/25/1955 DOA: 05/15/2020  PCP: [name], MD  Admit date: 05/15/2020 Discharge date: 05/17/2020  Admitted From: Home Disposition: Home  Recommendations for Outpatient Follow-up:  1. Follow up with PCP in 1-2 weeks 2. Follow-up with Dr. Felecia Shelling as soon as possible 3. Please obtain BMP/CBC in one week 4. Please follow up with your PCP on the following pending results: Unresulted Labs (From admission, onward)         None       Home Health: None Equipment/Devices: Walker  Discharge Condition: Stable CODE STATUS: Full code Diet recommendation: Cardiac  Subjective: Seen and examined.  No new complaint.  Still with slurred speech.  Brief/Interim Summary: Rodney Adkins a 64 y.o.malewith medical history significant ofMS, CHB s/p PPM, HOCM presented to the ED with c/o slurred speech, R sided weakness. HeSaw neurology in clinic, who promptly sent him to ED for MRI due to concern for MS flare vs stroke. MRI brain showed:Acute 12 mm diffusion restricted lesion of the left corona radiata.They favored demyelination over stroke.  Neurology was consulted by ED physician and then patient was ended up admitting under hospitalist service.  Based on initial MRI, neurology is opinion was that patient's symptoms are likely secondary to MS flareup so they ordered further work-up which included MRI brain with and without contrast, MR angiogram of the head and neck and MR cervical spine with and without contrast.  Based on those results, per my discussion with Leonie Man, they opined that patient's symptoms were likely secondary to acute a stroke and not MS flare.  Neurology cleared the patient for discharge on 05/16/2020 however patient was too weak and lethargic to go home so he was kept in the hospital overnight.  He was already seen by PT OT who recommended outpatient PT OT.  Patient seen and examined today.  He still  has a very little of slurred speech but has improved significantly compared to yesterday.  Still has tongue deviation on the right side as he had yesterday.  Does not have any other focal deficit.  I was able to speak to patient's wife in the room.  Apparently, she was not updated by neurology after all the scans were done.  His wife who is a PhD and works with physicians had a lot of questions and she was wondering if patient's symptoms are a combination of his stroke and MS and was wondering if high-dose Solu-Medrol should be tried.  She had several other questions regarding imaging studies, I referred her to speak to neurologist and per report from nursing staff, Dr. Leonie Man was off so Dr. Theda Sers was called who had talked to patient's wife and now patient's wife is convinced to take patient home.  Per neurology recommendation, I am discharging this patient on aspirin plus Plavix for 3 weeks and then Plavix will be discontinued and aspirin should be continued.  His LDL was 103 for which he is being discharged on atorvastatin 40 mg p.o. daily.  Patient has been referred to Dr. Felecia Shelling for treatment of MS.  Discharge Diagnoses:  Principal Problem:   Acute focal neurological deficit Active Problems:   Multiple sclerosis ([place])   Pacemaker-dual-chamber Medtronic   Stroke [facility])    Discharge Instructions  Discharge Instructions    Ambulatory referral to Occupational Therapy   Complete by: As directed    Ambulatory referral to Physical Therapy   Complete by: As directed    Ambulatory referral  to Speech Therapy   Complete by: As directed    Can you please go over with him about diet when     Allergies as of 05/17/2020   No Known Allergies     Medication List    TAKE these medications   aspirin 81 MG EC tablet Take 1 tablet (81 mg total) by mouth daily. Swallow whole.   atorvastatin 40 MG tablet Commonly known as: LIPITOR Take 1 tablet (40 mg total) by mouth daily at 6 PM.   Bafiertam 95  MG Cpdr Generic drug: Monomethyl Fumarate Take 95 mg by mouth in the morning and at bedtime.   clopidogrel 75 MG tablet Commonly known as: PLAVIX Take 1 tablet (75 mg total) by mouth daily for 20 days.   multivitamin tablet Take 1 tablet by mouth daily.   predniSONE 50 MG tablet Commonly known as: DELTASONE Take 25 tablets (1,250 mg total) by mouth daily with breakfast for 5 days.            Durable Medical Equipment  (From admission, onward)         Start     Ordered   05/17/20 1024  For home use only DME Walker rolling  Once       Question Answer Comment  Walker: With [place]   Patient needs a walker to treat with the following condition Stroke ([place])      05/17/20 1023   05/16/20 1615  For home use only DME Cane  Once        05/16/20 1614   05/16/20 1614  For home use only DME 3 n 1  Once        05/16/20 1614          Follow-up Information    [facility]   Specialty: Rehabilitation Why: The outpatient rehab will contact you for the first appointment Contact information: [address] [place]       [name], MD Follow up in 1 week(s).   Contact information: [address] [place] [address] [phone]        Britt Bottom, MD Follow up in 2 week(s).   Specialty: Neurology Contact information: [place] [address] [phone]              No Known Allergies  Consultations: Neurology   Procedures/Studies: DG Chest 2 View  Result Date: 05/15/2020 CLINICAL DATA:  History of MS. Slurred speech. EXAM: CHEST - 2 VIEW COMPARISON:  01/01/2016 FINDINGS: Left chest wall pacer device is noted with lead in the right atrial appendage and right ventricle. Heart size is normal. No pleural effusion or edema. Lungs are clear. No airspace densities. IMPRESSION: No active cardiopulmonary  abnormalities. Electronically Signed   By: Kerby Moors M.D.   On: 05/15/2020 21:59   MR ANGIO HEAD WO CONTRAST  Result Date: 05/16/2020 CLINICAL DATA:  Acute presentation with slurred speech and right-sided weakness yesterday. History of multiple sclerosis. Question multiple sclerosis versus stroke. EXAM: MRI HEAD WITHOUT CONTRAST MRA HEAD WITHOUT CONTRAST MRA NECK WITHOUT CONTRAST TECHNIQUE: Multiplanar, multiecho pulse sequences of the brain and surrounding structures were obtained without intravenous contrast. Angiographic images of the Circle of Willis were obtained using MRA technique without intravenous contrast. Angiographic images of the neck were obtained using MRA technique without intravenous contrast. Carotid stenosis measurements (when applicable) are obtained utilizing NASCET criteria, using the distal internal  carotid diameter as the denominator. COMPARISON:  05/15/2020.  07/11/2018. FINDINGS: MRI HEAD FINDINGS Brain: Previously seen demyelinating focus within the right side of the pons is stable. Cerebellar atrophy without focal cerebellar finding. Cerebral hemispheres show bilateral foci of T2 and FLAIR signal within the deep and subcortical white matter with a small focus of cortical involvement at the left parietal vertex. These are all unchanged since yesterday. The single exception to this is the focus of restricted diffusion in the deep white matter adjacent to the posterior body of the left lateral ventricle. This looks the same on diffusion imaging, measuring 12 mm, but appears larger on the T2 and FLAIR images. Because of that rapid change, ischemic infarction is favored. Demyelinating disease in an extremely active phase could possibly change this quickly. Low level contrast enhancement occurs in that location, similar to yesterday's study, possibly slightly more pronounced. Vascular: Major vessels at the base of the brain show flow. Skull and upper cervical spine: Negative  Sinuses/Orbits: Clear/normal Other: None MRA HEAD FINDINGS Both internal carotid arteries are widely patent through the siphon region. The anterior and middle cerebral vessels are normal without proximal stenosis, aneurysm or vascular malformation. Both vertebral arteries are patent. The right is a small vessel that terminates in PICA. The left vertebral artery supplies the basilar. No basilar stenosis. Superior cerebellar and posterior cerebral arteries appear normal. MRA NECK FINDINGS Aortic arch not included in the study. Both common carotid arteries widely patent to the bifurcation. Both carotid bifurcations appear normal. Both cervical internal carotid arteries are normal. Both vertebral artery origins are widely patent. Left vertebral artery is dominant. The right vertebral artery is a small vessel that terminates in PICA. Left vertebral artery appears normal through the cervical region to the foramen magnum. IMPRESSION: 1. Multiple foci of T2 and FLAIR signal within the brain as outlined above are stable since yesterday's exam. The single exception to this is the focus of restricted diffusion in the deep white matter adjacent to the posterior body of the left lateral ventricle. This looks the same on diffusion imaging, but appears slightly larger on the T2 and FLAIR images. Low level contrast enhancement occurs in that location, similar to yesterday's study, possibly slightly more pronounced. Given the rapid change on FLAIR and T2 imaging, ischemic infarction would seem more likely. However, demyelinating disease in an extremely active phase could give this appearance rapid change as well. 2. Intracranial MR angiography  is normal. 3. MR angiography of the neck vessels is normal. Electronically Signed   By: Nelson Chimes M.D.   On: 05/16/2020 16:36   MR ANGIO NECK WO CONTRAST  Result Date: 05/16/2020 CLINICAL DATA:  Acute presentation with slurred speech and right-sided weakness yesterday. History of  multiple sclerosis. Question multiple sclerosis versus stroke. EXAM: MRI HEAD WITHOUT CONTRAST MRA HEAD WITHOUT CONTRAST MRA NECK WITHOUT CONTRAST TECHNIQUE: Multiplanar, multiecho pulse sequences of the brain and surrounding structures were obtained without intravenous contrast. Angiographic images of the Circle of Willis were obtained using MRA technique without intravenous contrast. Angiographic images of the neck were obtained using MRA technique without intravenous contrast. Carotid stenosis measurements (when applicable) are obtained utilizing NASCET criteria, using the distal internal carotid diameter as the denominator. COMPARISON:  05/15/2020.  07/11/2018. FINDINGS: MRI HEAD FINDINGS Brain: Previously seen demyelinating focus within the right side of the pons is stable. Cerebellar atrophy without focal cerebellar finding. Cerebral hemispheres show bilateral foci of T2 and FLAIR signal within the deep and subcortical white matter with a  small focus of cortical involvement at the left parietal vertex. These are all unchanged since yesterday. The single exception to this is the focus of restricted diffusion in the deep white matter adjacent to the posterior body of the left lateral ventricle. This looks the same on diffusion imaging, measuring 12 mm, but appears larger on the T2 and FLAIR images. Because of that rapid change, ischemic infarction is favored. Demyelinating disease in an extremely active phase could possibly change this quickly. Low level contrast enhancement occurs in that location, similar to yesterday's study, possibly slightly more pronounced. Vascular: Major vessels at the base of the brain show flow. Skull and upper cervical spine: Negative Sinuses/Orbits: Clear/normal Other: None MRA HEAD FINDINGS Both internal carotid arteries are widely patent through the siphon region. The anterior and middle cerebral vessels are normal without proximal stenosis, aneurysm or vascular malformation. Both  vertebral arteries are patent. The right is a small vessel that terminates in PICA. The left vertebral artery supplies the basilar. No basilar stenosis. Superior cerebellar and posterior cerebral arteries appear normal. MRA NECK FINDINGS Aortic arch not included in the study. Both common carotid arteries widely patent to the bifurcation. Both carotid bifurcations appear normal. Both cervical internal carotid arteries are normal. Both vertebral artery origins are widely patent. Left vertebral artery is dominant. The right vertebral artery is a small vessel that terminates in PICA. Left vertebral artery appears normal through the cervical region to the foramen magnum. IMPRESSION: 1. Multiple foci of T2 and FLAIR signal within the brain as outlined above are stable since yesterday's exam. The single exception to this is the focus of restricted diffusion in the deep white matter adjacent to the posterior body of the left lateral ventricle. This looks the same on diffusion imaging, but appears slightly larger on the T2 and FLAIR images. Low level contrast enhancement occurs in that location, similar to yesterday's study, possibly slightly more pronounced. Given the rapid change on FLAIR and T2 imaging, ischemic infarction would seem more likely. However, demyelinating disease in an extremely active phase could give this appearance rapid change as well. 2. Intracranial MR angiography  is normal. 3. MR angiography of the neck vessels is normal. Electronically Signed   By: Nelson Chimes M.D.   On: 05/16/2020 16:36   MR BRAIN W WO CONTRAST  Result Date: 05/16/2020 CLINICAL DATA:  Acute presentation with slurred speech and right-sided weakness yesterday. History of multiple sclerosis. Question multiple sclerosis versus stroke. EXAM: MRI HEAD WITHOUT CONTRAST MRA HEAD WITHOUT CONTRAST MRA NECK WITHOUT CONTRAST TECHNIQUE: Multiplanar, multiecho pulse sequences of the brain and surrounding structures were obtained without  intravenous contrast. Angiographic images of the Circle of Willis were obtained using MRA technique without intravenous contrast. Angiographic images of the neck were obtained using MRA technique without intravenous contrast. Carotid stenosis measurements (when applicable) are obtained utilizing NASCET criteria, using the distal internal carotid diameter as the denominator. COMPARISON:  05/15/2020.  07/11/2018. FINDINGS: MRI HEAD FINDINGS Brain: Previously seen demyelinating focus within the right side of the pons is stable. Cerebellar atrophy without focal cerebellar finding. Cerebral hemispheres show bilateral foci of T2 and FLAIR signal within the deep and subcortical white matter with a small focus of cortical involvement at the left parietal vertex. These are all unchanged since yesterday. The single exception to this is the focus of restricted diffusion in the deep white matter adjacent to the posterior body of the left lateral ventricle. This looks the same on diffusion imaging, measuring 12 mm, but  appears larger on the T2 and FLAIR images. Because of that rapid change, ischemic infarction is favored. Demyelinating disease in an extremely active phase could possibly change this quickly. Low level contrast enhancement occurs in that location, similar to yesterday's study, possibly slightly more pronounced. Vascular: Major vessels at the base of the brain show flow. Skull and upper cervical spine: Negative Sinuses/Orbits: Clear/normal Other: None MRA HEAD FINDINGS Both internal carotid arteries are widely patent through the siphon region. The anterior and middle cerebral vessels are normal without proximal stenosis, aneurysm or vascular malformation. Both vertebral arteries are patent. The right is a small vessel that terminates in PICA. The left vertebral artery supplies the basilar. No basilar stenosis. Superior cerebellar and posterior cerebral arteries appear normal. MRA NECK FINDINGS Aortic arch not  included in the study. Both common carotid arteries widely patent to the bifurcation. Both carotid bifurcations appear normal. Both cervical internal carotid arteries are normal. Both vertebral artery origins are widely patent. Left vertebral artery is dominant. The right vertebral artery is a small vessel that terminates in PICA. Left vertebral artery appears normal through the cervical region to the foramen magnum. IMPRESSION: 1. Multiple foci of T2 and FLAIR signal within the brain as outlined above are stable since yesterday's exam. The single exception to this is the focus of restricted diffusion in the deep white matter adjacent to the posterior body of the left lateral ventricle. This looks the same on diffusion imaging, but appears slightly larger on the T2 and FLAIR images. Low level contrast enhancement occurs in that location, similar to yesterday's study, possibly slightly more pronounced. Given the rapid change on FLAIR and T2 imaging, ischemic infarction would seem more likely. However, demyelinating disease in an extremely active phase could give this appearance rapid change as well. 2. Intracranial MR angiography  is normal. 3. MR angiography of the neck vessels is normal. Electronically Signed   By: Nelson Chimes M.D.   On: 05/16/2020 16:36   MR Brain W and Wo Contrast  Result Date: 05/15/2020 CLINICAL DATA:  64 year old male with multiple sclerosis, developed slurred speech last night at 2000 hours. Neurology considering medullary infarct versus MS exacerbation. Medtronic MRI conditional pacemaker and leads. The patient was scanned according to safety guidelines with no issues reported. EXAM: MRI HEAD WITHOUT AND WITH CONTRAST TECHNIQUE: Multiplanar, multiecho pulse sequences of the brain and surrounding structures were obtained without and with intravenous contrast. CONTRAST:  85mL GADAVIST GADOBUTROL 1 MMOL/ML IV SOLN COMPARISON:  Brain MRI 07/11/2018. FINDINGS: Brain: There is a round 12 mm  area of restricted diffusion at the left posterior corona radiata tracking slightly inferiorly (series 3, image 33) associated with faint T2 and FLAIR hyperintensity (series 5, image 17). No hemorrhage or mass effect. Last year a small and inconspicuous area FLAIR hyperintensity was present at this same site (series 6, image 15 at that time). This lesion is faintly enhancing (series 12, image 13). No mass effect. No other convincing diffusion restriction. There are occasional areas of T2 shine through. No chronic cerebral blood products. Superimposed widely scattered nodular and frequently periventricular white matter T2 and FLAIR hyperintense lesions compatible with chronic multiple sclerosis. Comparing axial FLAIR sequences from last year, there is a 2nd new white matter lesion identified in the posterior left hemisphere also on series 5, image 17, subcortical white matter of the parietal lobe. Other white matter lesions appear stable and size and configuration. Occasional chronic cortical involvement (left parietal lobe series 5, image 22). Deep gray matter  nuclei appear spared. There is chronic heterogeneous brainstem involvement including at the right lateral pons on series 7, image 9 today. The cerebellum remains relatively spared. No other abnormal intracranial enhancement identified. No the midline shift, mass effect, evidence of mass lesion, ventriculomegaly, extra-axial collection or acute intracranial hemorrhage. Cervicomedullary junction and pituitary are within normal limits. Vascular: Major intracranial vascular flow voids are stable since last year, with dominant left and diminutive right distal vertebral arteries. The major dural venous sinuses are enhancing and appear to be patent. Skull and upper cervical spine: Visible cervical spine appears grossly normal for age. Visualized bone marrow signal is within normal limits. Sinuses/Orbits: Stable and negative. Other: Mastoids remain clear. Grossly  normal visible internal auditory structures. Scalp and face appear negative. IMPRESSION: 1. Acute 12 mm diffusion restricted lesion of the left corona radiata. Faint enhancement, along with subtle FLAIR hyperintensity here last year, favor active demyelination rather than a small vessel white matter infarct. No associated hemorrhage or mass effect. 2. Underlying chronic demyelinating disease with a 2nd small new white matter lesion in the left parietal lobe since last year, although no other active demyelination identified. Electronically Signed   By: Genevie Ann M.D.   On: 05/15/2020 15:10   MR CERVICAL SPINE W WO CONTRAST  Result Date: 05/16/2020 CLINICAL DATA:  Acute presentation with slurred speech and right-sided weakness. Question secondary to multiple sclerosis or stroke. EXAM: MRI CERVICAL SPINE WITHOUT AND WITH CONTRAST TECHNIQUE: Multiplanar and multiecho pulse sequences of the cervical spine, to include the craniocervical junction and cervicothoracic junction, were obtained without and with intravenous contrast. CONTRAST:  6.11mL GADAVIST GADOBUTROL 1 MMOL/ML IV SOLN COMPARISON:  No previous cervical imaging. FINDINGS: The study suffers from motion degradation. Alignment: Normal Vertebrae: No fracture or primary bone lesion. Cord: Question of abnormal signal in the right side of the cord at the C4 level. Because of the motion degradation, I cannot be certain of this finding. If real, it could be a focus of demyelination. No abnormal contrast enhancement. Posterior Fossa, vertebral arteries, paraspinal tissues: See results of brain MRI. Disc levels: Foramen magnum is widely patent. Ordinary mild osteoarthritis at the C1-2 articulation. C2-3: Normal interspace. C3-4: Spondylosis with endplate osteophytes and bulging of the disc. No compressive canal stenosis. Bilateral bony foraminal narrowing that could affect either C4 nerve. C4-5: Endplate osteophytes and bulging of the disc. No compressive central canal  stenosis. Bilateral bony foraminal narrowing that could affect either C5 nerve. C5-6: Endplate osteophytes and bulging of the disc. Narrowing of the ventral subarachnoid space but no compression of the cord. Mild bilateral bony foraminal narrowing with some potential to affect either C6 nerve. C6-7: Small endplate osteophytes and minor bulging of the disc. No canal stenosis. Mild bilateral foraminal narrowing. C7-T1: Minimal uncovertebral prominence. No significant narrowing of the canal or foramina. IMPRESSION: 1. Motion degraded study. Question of abnormal signal in the right side of the cord at the C4 level. Because of the motion degradation, I cannot be certain of this finding. If real, it could be a focus of demyelination. No abnormal contrast enhancement. 2. Degenerative spondylosis throughout the cervical spine. No compressive canal stenosis. Bilateral foraminal narrowing at C3-4, C4-5 and C5-6 that could affect the C4, C5 and/or C6 nerves. Electronically Signed   By: Nelson Chimes M.D.   On: 05/16/2020 16:21   ECHOCARDIOGRAM COMPLETE  Result Date: 05/16/2020    ECHOCARDIOGRAM REPORT   Patient Name:   RONTE PARKER Date of Exam: 05/16/2020 Medical Rec #:  [id number]        Height:       70.0 in Accession #:    [id number]       Weight:       140.0 lb Date of Birth:  January 26, 1956        BSA:          1.794 m Patient Age:    34 years         BP:           124/80 mmHg Patient Gender: M                HR:           74 bpm. Exam Location:  Inpatient Procedure: 2D Echo and Intracardiac Opacification Agent Indications:    Stroke  History:        Patient has prior history of Echocardiogram examinations, most                 recent 10/29/2015. HOCM; Pacemaker.  Sonographer:    Mikki Santee RDCS (AE) Referring Phys: [place]  1. Left ventricular ejection fraction, by estimation, is 60 to 65%. The left ventricle has normal function. The left ventricle has no regional wall motion  abnormalities. There is moderate focal basal septal left ventricular hypertrophy. Peak LV outflow tract gradient 39 mmHg with Valsalva. There was mitral valve systolic anterior motion seen best in the apical views. Left ventricular diastolic parameters are consistent with Grade I diastolic dysfunction (impaired relaxation).  2. Right ventricular systolic function is normal. The right ventricular size is normal. There is normal pulmonary artery systolic pressure. The estimated right ventricular systolic pressure is 30.8 mmHg.  3. The aortic valve is tricuspid. Aortic valve regurgitation is not visualized. No aortic stenosis is present.  4. The mitral valve is abnormal with systolic anterior motion noted. Trivial mitral valve regurgitation. No evidence of mitral stenosis.  5. The inferior vena cava is normal in size with greater than 50% respiratory variability, suggesting right atrial pressure of 3 mmHg.  6. Findings consistent with hypertrophic obstructive cardiomyopathy. FINDINGS  Left Ventricle: Left ventricular ejection fraction, by estimation, is 60 to 65%. The left ventricle has normal function. The left ventricle has no regional wall motion abnormalities. Definity contrast agent was given IV to delineate the left ventricular  endocardial borders. The left ventricular internal cavity size was normal in size. There is moderate focal basal septal left ventricular hypertrophy. Left ventricular diastolic parameters are consistent with Grade I diastolic dysfunction (impaired relaxation). Right Ventricle: The right ventricular size is normal. No increase in right ventricular wall thickness. Right ventricular systolic function is normal. There is normal pulmonary artery systolic pressure. The tricuspid regurgitant velocity is 2.04 m/s, and  with an assumed right atrial pressure of 3 mmHg, the estimated right ventricular systolic pressure is 65.7 mmHg. Left Atrium: Left atrial size was normal in size. Right Atrium:  Right atrial size was normal in size. Pericardium: There is no evidence of pericardial effusion. Mitral Valve: The mitral valve is abnormal. Trivial mitral valve regurgitation. No evidence of mitral valve stenosis. Tricuspid Valve: The tricuspid valve is normal in structure. Tricuspid valve regurgitation is trivial. Aortic Valve: The aortic valve is tricuspid. Aortic valve regurgitation is not visualized. No aortic stenosis is present. Pulmonic Valve: The pulmonic valve was normal in structure. Pulmonic valve regurgitation is not visualized. Aorta: The aortic root is normal in size and structure. Venous: The inferior vena cava is normal  in size with greater than 50% respiratory variability, suggesting right atrial pressure of 3 mmHg. IAS/Shunts: No atrial level shunt detected by color flow Doppler. Additional Comments: A pacer wire is visualized in the right ventricle.  LEFT VENTRICLE PLAX 2D LVIDd:         3.96 cm  Diastology LVIDs:         2.50 cm  LV e' medial:    6.74 cm/s LV PW:         1.45 cm  LV E/e' medial:  11.4 LV IVS:        1.55 cm  LV e' lateral:   7.40 cm/s LVOT diam:     1.80 cm  LV E/e' lateral: 10.4 LVOT Area:     2.54 cm  RIGHT VENTRICLE RV S prime:     10.20 cm/s TAPSE (M-mode): 1.9 cm LEFT ATRIUM           Index       RIGHT ATRIUM          Index LA diam:      3.40 cm 1.90 cm/m  RA Area:     8.74 cm LA Vol (A2C): 51.2 ml 28.54 ml/m RA Volume:   15.00 ml 8.36 ml/m LA Vol (A4C): 31.7 ml 17.67 ml/m   AORTA Ao Root diam: 2.50 cm MITRAL VALVE               TRICUSPID VALVE MV Area (PHT): 3.93 cm    TR Peak grad:   16.6 mmHg MV Decel Time: 193 msec    TR Vmax:        204.00 cm/s MV E velocity: 77.10 cm/s MV A velocity: 89.30 cm/s  SHUNTS MV E/A ratio:  0.86        Systemic Diam: 1.80 cm Loralie Champagne MD Electronically signed by Loralie Champagne MD Signature Date/Time: 05/16/2020/1:45:10 PM    Final      Discharge Exam: Vitals:   05/17/20 0935 05/17/20 1302  BP: 121/73 102/65  Pulse: 86 77   Resp: 18 16  Temp: 97.8 F (36.6 C) 97.8 F (36.6 C)  SpO2: 97% 99%   Vitals:   05/17/20 0018 05/17/20 0405 05/17/20 0935 05/17/20 1302  BP: 112/77 112/74 121/73 102/65  Pulse: 92 85 86 77  Resp: 16 18 18 16   Temp: 98.3 F (36.8 C) 98 F (36.7 C) 97.8 F (36.6 C) 97.8 F (36.6 C)  TempSrc: Oral Oral Oral Oral  SpO2: 98% 98% 97% 99%  Weight:      Height:        General: Pt is alert, awake, not in acute distress Cardiovascular: RRR, S1/S2 +, no rubs, no gallops Respiratory: CTA bilaterally, no wheezing, no rhonchi Abdominal: Soft, NT, ND, bowel sounds + Extremities: no edema, no cyanosis    The results of significant diagnostics from this hospitalization (including imaging, microbiology, ancillary and laboratory) are listed below for reference.     Microbiology: Recent Results (from the past 240 hour(s))  Respiratory Panel by RT PCR (Flu A&B, Covid) - Nasopharyngeal Swab     Status: None   Collection Time: 05/15/20  6:19 PM   Specimen: Nasopharyngeal Swab  Result Value Ref Range Status   SARS Coronavirus 2 by RT PCR NEGATIVE NEGATIVE Final    Comment: (NOTE) SARS-CoV-2 target nucleic acids are NOT DETECTED.  The SARS-CoV-2 RNA is generally detectable in upper respiratoy specimens during the acute phase of infection. The lowest concentration of SARS-CoV-2 viral copies this assay can  detect is 131 copies/mL. A negative result does not preclude SARS-Cov-2 infection and should not be used as the sole basis for treatment or other patient management decisions. A negative result may occur with  improper specimen collection/handling, submission of specimen other than nasopharyngeal swab, presence of viral mutation(s) within the areas targeted by this assay, and inadequate number of viral copies (<131 copies/mL). A negative result must be combined with clinical observations, patient history, and epidemiological information. The expected result is Negative.  Fact Sheet  for Patients:  PinkCheek.be  Fact Sheet for Healthcare Providers:  GravelBags.it  This test is no t yet approved or cleared by the [place] FDA and  has been authorized for detection and/or diagnosis of SARS-CoV-2 by FDA under an Emergency Use Authorization (EUA). This EUA will remain  in effect (meaning this test can be used) for the duration of the COVID-19 declaration under Section 564(b)(1) of the Act, 21 U.S.C. section 360bbb-3(b)(1), unless the authorization is terminated or revoked sooner.     Influenza A by PCR NEGATIVE NEGATIVE Final   Influenza B by PCR NEGATIVE NEGATIVE Final    Comment: (NOTE) The Xpert Xpress SARS-CoV-2/FLU/RSV assay is intended as an aid in  the diagnosis of influenza from Nasopharyngeal swab specimens and  should not be used as a sole basis for treatment. Nasal washings and  aspirates are unacceptable for Xpert Xpress SARS-CoV-2/FLU/RSV  testing.  Fact Sheet for Patients: PinkCheek.be  Fact Sheet for Healthcare Providers: GravelBags.it  This test is not yet approved or cleared by the [place] FDA and  has been authorized for detection and/or diagnosis of SARS-CoV-2 by  FDA under an Emergency Use Authorization (EUA). This EUA will remain  in effect (meaning this test can be used) for the duration of the  Covid-19 declaration under Section 564(b)(1) of the Act, 21  U.S.C. section 360bbb-3(b)(1), unless the authorization is  terminated or revoked. Performed at [facility], [place] [address], [place], [place] [address]      Labs: BNP (last 3 results) No results for input(s): BNP in the last 8760 hours. Basic Metabolic Panel: Recent Labs  Lab 05/15/20 1047 05/15/20 1053 05/15/20 1947 05/16/20 0308  NA 140 141  --  141  K 5.0 4.7  --  3.7  CL 105 105  --  106  CO2 28  --   --  24  GLUCOSE 106* 99  --  90  BUN  14 18  --  14  CREATININE 0.92 0.80 0.88 0.96  CALCIUM 9.9  --   --  9.5   Liver Function Tests: Recent Labs  Lab 05/15/20 1047  AST 24  ALT 21  ALKPHOS 76  BILITOT 0.9  PROT 6.8  ALBUMIN 4.1   No results for input(s): LIPASE, AMYLASE in the last 168 hours. No results for input(s): AMMONIA in the last 168 hours. CBC: Recent Labs  Lab 05/15/20 1047 05/15/20 1053 05/15/20 1947 05/16/20 0308  WBC 6.7  --  7.5 8.8  NEUTROABS 4.6  --   --   --   HGB 15.4 15.3 14.8 14.9  HCT 46.1 45.0 44.0 43.3  MCV 91.3  --  89.8 88.5  PLT 222  --  198 204   Cardiac Enzymes: No results for input(s): CKTOTAL, CKMB, CKMBINDEX, TROPONINI in the last 168 hours. BNP: Invalid input(s): POCBNP CBG: Recent Labs  Lab 05/15/20 1102  GLUCAP 105*   D-Dimer No results for input(s): DDIMER in the last 72 hours.  Hgb A1c Recent Labs    05/16/20 0308  HGBA1C 5.7*   Lipid Profile Recent Labs    05/16/20 0308  CHOL 170  HDL 49  LDLCALC 103*  TRIG 92  CHOLHDL 3.5   Thyroid function studies No results for input(s): TSH, T4TOTAL, T3FREE, THYROIDAB in the last 72 hours.  Invalid input(s): FREET3 Anemia work up No results for input(s): VITAMINB12, FOLATE, FERRITIN, TIBC, IRON, RETICCTPCT in the last 72 hours. Urinalysis    Component Value Date/Time   BILIRUBINUR negative 01/16/2017 1034   BILIRUBINUR negative 01/11/2017 1006   KETONESUR negative 01/16/2017 1034   PROTEINUR negative 01/16/2017 1034   PROTEINUR 30 01/11/2017 1006   UROBILINOGEN 0.2 01/16/2017 1034   NITRITE Negative 01/16/2017 1034   NITRITE negative 01/11/2017 1006   LEUKOCYTESUR Trace (A) 01/16/2017 1034   Sepsis Labs Invalid input(s): PROCALCITONIN,  WBC,  LACTICIDVEN Microbiology Recent Results (from the past 240 hour(s))  Respiratory Panel by RT PCR (Flu A&B, Covid) - Nasopharyngeal Swab     Status: None   Collection Time: 05/15/20  6:19 PM   Specimen: Nasopharyngeal Swab  Result Value Ref Range Status    SARS Coronavirus 2 by RT PCR NEGATIVE NEGATIVE Final    Comment: (NOTE) SARS-CoV-2 target nucleic acids are NOT DETECTED.  The SARS-CoV-2 RNA is generally detectable in upper respiratoy specimens during the acute phase of infection. The lowest concentration of SARS-CoV-2 viral copies this assay can detect is 131 copies/mL. A negative result does not preclude SARS-Cov-2 infection and should not be used as the sole basis for treatment or other patient management decisions. A negative result may occur with  improper specimen collection/handling, submission of specimen other than nasopharyngeal swab, presence of viral mutation(s) within the areas targeted by this assay, and inadequate number of viral copies (<131 copies/mL). A negative result must be combined with clinical observations, patient history, and epidemiological information. The expected result is Negative.  Fact Sheet for Patients:  PinkCheek.be  Fact Sheet for Healthcare Providers:  GravelBags.it  This test is no t yet approved or cleared by the [place] FDA and  has been authorized for detection and/or diagnosis of SARS-CoV-2 by FDA under an Emergency Use Authorization (EUA). This EUA will remain  in effect (meaning this test can be used) for the duration of the COVID-19 declaration under Section 564(b)(1) of the Act, 21 U.S.C. section 360bbb-3(b)(1), unless the authorization is terminated or revoked sooner.     Influenza A by PCR NEGATIVE NEGATIVE Final   Influenza B by PCR NEGATIVE NEGATIVE Final    Comment: (NOTE) The Xpert Xpress SARS-CoV-2/FLU/RSV assay is intended as an aid in  the diagnosis of influenza from Nasopharyngeal swab specimens and  should not be used as a sole basis for treatment. Nasal washings and  aspirates are unacceptable for Xpert Xpress SARS-CoV-2/FLU/RSV  testing.  Fact Sheet for  Patients: PinkCheek.be  Fact Sheet for Healthcare Providers: GravelBags.it  This test is not yet approved or cleared by the [place] FDA and  has been authorized for detection and/or diagnosis of SARS-CoV-2 by  FDA under an Emergency Use Authorization (EUA). This EUA will remain  in effect (meaning this test can be used) for the duration of the  Covid-19 declaration under Section 564(b)(1) of the Act, 21  U.S.C. section 360bbb-3(b)(1), unless the authorization is  terminated or revoked. Performed at [facility], [place] [address] [place], [place] [address]      Time coordinating discharge: Over 30 minutes  SIGNED:   Darliss Cheney, MD  [facility] 05/17/2020, 2:31 PM  If 7PM-7AM, please contact night-coverage [email or web address]

## 2020-05-21 ENCOUNTER — Other Ambulatory Visit: Payer: Self-pay

## 2020-05-21 ENCOUNTER — Ambulatory Visit: Payer: BC Managed Care – PPO | Attending: Family Medicine

## 2020-05-21 VITALS — BP 100/70 | HR 81

## 2020-05-21 DIAGNOSIS — R41840 Attention and concentration deficit: Secondary | ICD-10-CM | POA: Diagnosis present

## 2020-05-21 DIAGNOSIS — R278 Other lack of coordination: Secondary | ICD-10-CM | POA: Diagnosis present

## 2020-05-21 DIAGNOSIS — R2689 Other abnormalities of gait and mobility: Secondary | ICD-10-CM | POA: Diagnosis present

## 2020-05-21 DIAGNOSIS — M6281 Muscle weakness (generalized): Secondary | ICD-10-CM | POA: Insufficient documentation

## 2020-05-21 DIAGNOSIS — I69351 Hemiplegia and hemiparesis following cerebral infarction affecting right dominant side: Secondary | ICD-10-CM | POA: Diagnosis not present

## 2020-05-21 NOTE — Therapy (Signed)
[facility] [address] [place], [place], [address] Phone: [phone]   Fax:  [phone]  Physical Therapy Evaluation  Patient Details  Name: Rodney Adkins MRN: [id number] Date of Birth: 07-11-1955 Referring Provider (PT): Otto Herb, MD   Encounter Date: 05/21/2020   PT End of Session - 05/21/20 1421    Visit Number 1    Number of Visits 17    Authorization Type BCBS PPO - FOTO patient    PT Start Time 5625    PT Stop Time 1411    PT Time Calculation (min) 56 min    Equipment Utilized During Treatment Gait belt    Activity Tolerance Patient tolerated treatment well    Behavior During Therapy WFL for tasks assessed/performed           Past Medical History:  Diagnosis Date  . Complete heart block [facility]) June 2009   a. Presented with hypotension/pulm edema 12/2007 when HOCM was also discovered. s/p dual chamber MDT pacemaker. Thallium stest 7/09 negative for infiltrative disease or ischemia.  Followed Dr [name]  Marland Kitchen Heart murmur   . HOCM (hypertrophic obstructive cardiomyopathy) ([place]) 2009   Discovered by ECHO when he developed CHB. Mgmt Dr Caryl Comes  and Dr [name] at [facility]. Has outflow obstruction on ECHO 2011  . Multiple sclerosis ([place]) 1998   Sxs started 1998 with L body numbness. MRI c/w MS. Started Avonex. Saw Dr [name] at [facility] and switched to Betaseron and was on for 5 yrs but developed depression and site rxns and D/C'd 2006. While on Betaseron, occassionally required solumedrol for flares.  . Pacemaker 2017  . Prostate enlargement   . Right bundle branch block   . SOB (shortness of breath)    a. PFTs 09/2010 essentially normal per pulm note (mild obst defect). b. 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  . 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  Pacer reprogrammed with improvement in sx.    Past Surgical History:  Procedure Laterality Date  . LEFT HEART CATHETERIZATION WITH CORONARY ANGIOGRAM N/A 08/22/2013   Procedure: LEFT HEART CATHETERIZATION WITH CORONARY ANGIOGRAM;  Surgeon: [name], MD;  Location: [facility] CATH LAB;  Service: Cardiovascular;  Laterality: N/A;  . LEG TENDON SURGERY  2008   Os peroneus resection and peroneal tendon repair  . pace Agricultural engineer    . PACEMAKER INSERTION  12/2007   Complete heart block  . PACEMAKER LEAD REMOVAL Left 07/18/2015   Procedure: ATRIAL PACEMAKER LEAD EXTRACTION; ATRIAL PACEMAKER LEAD INSERTION; GENERATOR REPLACEMENT.;  Surgeon: [name], MD;  Location: [place];  Service: Cardiovascular;  Laterality: Left;  [name] to back up  . PERIPHERAL VASCULAR CATHETERIZATION N/A 02/12/2015   Procedure: Venogram;  Surgeon: [name], MD;  Location: [facility] CV LAB;  Service: Cardiovascular;  Laterality: N/A;  . TEE WITHOUT CARDIOVERSION N/A 07/18/2015   Procedure: TRANSESOPHAGEAL ECHOCARDIOGRAM (TEE);  Surgeon: [name], MD;  Location: [facility] OR;  Service: Cardiovascular;  Laterality: N/A;    Vitals:   05/21/20 1348  BP: 100/70  Pulse: 81  SpO2: 98%      Subjective Assessment - 05/21/20 1323    Subjective Pt presents to OP Neurorehab s/p acute stroke on 05/14/20. 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 Pt's last exacerbation of RRMS was  earlier this year (could not specify date/month). Pt is having issues with R hand coordination/weakness, RLE weakness,  difficulty walking, speaking and swallowing. Prior to acute CVA, pt was independent and did not use AD. Pt has always struggled with some R foot drop and R-sided weakness d/t MS. Pt also reports having some SOB sometimes d/t heart issues. Pt wants to be able to walk better and get back to gardening.    Patient is accompained by: Family member    Pertinent History MS, CHB s/p PPM, HOCM    Limitations  Walking;Writing;Standing;Lifting;House hold activities    Currently in Pain? No/denies              [facility] PT Assessment - 05/21/20 1326      Assessment   Medical Diagnosis Acute CVA    Referring Provider (PT) Otto Herb, MD    Onset Date/Surgical Date 05/14/20    Hand Dominance Right      Precautions   Precautions ICD/Pacemaker;Fall      Restrictions   Weight Bearing Restrictions No      Balance Screen   Has the patient fallen in the past 6 months Yes    How many times? 2   going up/down back steps; up steps more often   Has the patient had a decrease in activity level because of a fear of falling?  Yes    Is the patient reluctant to leave their home because of a fear of falling?  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 Pt also had R sided weakness prior to L CVA. Dysarthic speech, reports some swallowing issues.    Focus on Therapeutic Outcomes (FOTO)  46      Sensation   Light Touch Appears Intact      Coordination   Gross Motor Movements are Fluid and Coordinated Yes    Fine Motor Movements are Fluid and Coordinated No    Finger Nose Finger Test WNL    Heel Shin Test WNL      Posture/Postural Control   Posture Comments No  significant deficits noted.      ROM / Strength   AROM / PROM / Strength AROM;Strength      AROM   Overall AROM  Within functional limits for tasks performed      Strength   Overall Strength Deficits    Strength Assessment Site Elbow;Hand;Hip;Knee;Ankle    Right/Left Hip Right;Left    Right Hip Flexion 3+/5    Right Hip ABduction 3+/5    Right Hip ADduction 4/5    Left Hip Flexion 4+/5  Left Hip ABduction 4+/5    Left Hip ADduction 4+/5    Right/Left Knee Right;Left    Right Knee Flexion 3/5    Right Knee Extension 3+/5    Left Knee Flexion 4+/5    Left Knee Extension 4+/5    Right/Left Ankle Right;Left    Right Ankle Dorsiflexion 4-/5    Right Ankle Plantar Flexion 4+/5    Left Ankle Dorsiflexion 4+/5    Left Ankle Plantar Flexion 4+/5      Bed Mobility   Bed Mobility Not assessed      Transfers   Transfers Sit to Stand;Stand to Sit    Sit to Stand 4: Min guard    Five time sit to stand comments  22 seconds from standard arm chair without UE support    Stand to Sit 4: Min guard    Number of Reps Other reps (comment)   6   Transfer Cueing Cued on anterior scoot and anterior weight shift    Comments Pt came up on toes a couple of times during 5xSTS when coming to full stand but was able to regain balance and go back to feet flat on the ground on his own.      Ambulation/Gait   Ambulation/Gait Yes    Ambulation/Gait Assistance 4: Min guard;5: Supervision    Ambulation/Gait Assistance Details --    Ambulation Distance (Feet) 230 Feet    Assistive device Rolling walker    Gait Pattern Step-through pattern;Decreased dorsiflexion - right;Decreased dorsiflexion - left;Decreased step length - right;Decreased step length - left;Decreased arm swing - right;Poor foot clearance - right;Decreased hip/knee flexion - right    Ambulation Surface Level;Indoor    Gait velocity 0.65 m/s    Stairs Yes    Stairs Assistance 4: Min guard    Stair Management Technique One rail Left;Step  to pattern    Number of Stairs 8    Height of Stairs 6      Balance   Balance Assessed Yes      Standardized Balance Assessment   Standardized Balance Assessment Berg Balance Test;Five Times Sit to Stand    Five times sit to stand comments  22 s from standard arm chair with no UE support. CGA      Berg Balance Test   Sit to Stand Able to stand without using hands and stabilize independently    Standing Unsupported Able to stand safely 2 minutes    Sitting with Back Unsupported but Feet Supported on Floor or Stool Able to sit safely and securely 2 minutes    Stand to Sit Sits safely with minimal use of hands    Transfers Able to transfer safely, minor use of hands    Standing Unsupported with Eyes Closed Able to stand 10 seconds with supervision    Standing Unsupported with Feet Together Able to place feet together independently and stand 1 minute safely    From Standing, Reach Forward with Outstretched Arm Can reach confidently >25 cm (10")    From Standing Position, Pick up Object from Floor Able to pick up shoe, needs supervision    From Standing Position, Turn to Look Behind Over each Shoulder Turn sideways only but maintains balance    Turn 360 Degrees Able to turn 360 degrees safely but slowly    Standing Unsupported, Alternately Place Feet on Step/Stool Able to complete >2 steps/needs minimal assist    Standing Unsupported, One Foot in Front Able to plae foot ahead of the other  independently and hold 30 seconds    Standing on One Leg Able to lift leg independently and hold 5-10 seconds    Total Score 45    Berg comment: 45/56 indicating significant fall risk                      Objective measurements completed on examination: See above findings.               PT Education - 05/21/20 1421    Education Details Pt educated on POC and purpose of PT. Pt instructed to work on sit to stand as exercise focusing on form and getting balance each time. Instructed  that he could walk in home with walker.    Person(s) Educated Patient;Spouse    Methods Explanation    Comprehension Verbalized understanding            PT Short Term Goals - 05/21/20 1755      PT SHORT TERM GOAL #1   Title Pt will be independent with initial HEP.    Baseline NA    Time 4    Period Weeks    Status New    Target Date 06/18/20      PT SHORT TERM GOAL #2   Title Pt will decrease 5xSTS to </= 15 seconds for decreased fall risk.    Baseline 22 s without UE support from standard arm chair 05/21/20    Time 4    Period Weeks    Status New    Target Date 06/18/20      PT SHORT TERM GOAL #3   Title Pt will increase gait speed to >/= 0.75 m/s for increased access to community and decreased fall risk.    Baseline 0.65 m/s with RW 05/21/20    Time 4    Period Weeks    Status New    Target Date 06/18/20      PT SHORT TERM GOAL #4   Title Pt will increase Berg score by 4 points in order to decrease fall risk and increase static and dynamic balance by a clincal detectable change (4 points = CDC).    Baseline 45/56 ind mod fall risk 05/21/20    Time 4    Period Weeks    Status New    Target Date 06/18/20             PT Long Term Goals - 05/21/20 1759      PT LONG TERM GOAL #1   Title Pt will be independent with progressive HEP.    Baseline NA    Time 8    Period Weeks    Status New    Target Date 07/16/20      PT LONG TERM GOAL #2   Title Pt will decrease 5xSTS to </= 12 seconds to no longer be classified as fall risk and to be WNL for age and gender.    Baseline 22 s from standard arm chair without UE support    Time 8    Period Weeks    Status New    Target Date 07/16/20      PT LONG TERM GOAL #3   Title Pt will reach >50/56 on Berg balance score for improved static and dynamic balance and decreased risk for falls.    Baseline 45/56 (mod fall risk) 05/21/20    Time 8    Period Weeks    Status New    Target Date 07/16/20  PT LONG TERM  GOAL #4   Title Pt will reach 1.0 m/s gait speed for community ambulation and decreased fall risk.    Baseline 0.65 m/s    Time 8    Period Weeks    Status New    Target Date 07/16/20      PT LONG TERM GOAL #5   Title Pt will ambulated 800+ feet on outdoor/unlevel surfaces with supervision and LRAD.    Baseline NT    Time 8    Period Weeks    Status New    Target Date 07/16/20      Additional Long Term Goals   Additional Long Term Goals Yes      PT LONG TERM GOAL #6   Title Pt will increase FOTO from 46 to >/= 70 for increased confidence in abilities.    Baseline 46 05/21/20    Time 8    Period Weeks    Status New    Target Date 07/16/20                  Plan - 05/21/20 1426    Clinical Impression Statement Pt presents to OP Neurorehab s/p acute stroke on 05/14/20. 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 Pt's last exacerbation of RRMS was earlier this year (could not specify date/month). Pt is having issues with R hand coordination/weakness, RLE weakness,  difficulty walking, speaking and swallowing. Prior to acute CVA, pt was independent and did not use AD. Pt has always struggled with some R foot drop and R-sided weakness d/t MS. Pt also reports having some SOB sometimes d/t heart issues. Pt wants to be able to walk better and get back to gardening. Upon evaluation, pt has no sensory deficits and some mild strength deficits on R side. Pt's gait speed with RW was 0.65 m/s and pt scored a 45/56 on the Berg balance scale, indicating increased risk for falls. Pt is very motivated and wants to start HEP as soon as possible. Pt would benefit from skilled PT services for 2x/week x 8 weeks in order to address R-sided weakness, gait abnormalities, balance deficits, functional transfers, and overall activity tolerance.    Personal Factors and Comorbidities Comorbidity 3+;Age    Comorbidities MS, CHB s/p PPM, HOCM    Examination-Activity Limitations Squat;Locomotion  Level;Transfers;Stairs;Stand    Examination-Participation Restrictions Yard Work;Occupation;Driving;Community Activity    Stability/Clinical Decision Making Evolving/Moderate complexity    Clinical Decision Making Moderate    Rehab Potential Good    PT Frequency 2x / week    PT Duration 8 weeks    PT Treatment/Interventions Gait training;Stair training;Functional mobility training;Therapeutic activities;Therapeutic exercise;Balance training;Neuromuscular re-education;Patient/family education;Orthotic Fit/Training;DME Instruction;ADLs/Self Care Home Management;Energy conservation;Manual techniques;Vestibular    PT Next Visit Plan Initiate HEP!!! Further assess gait and endurance. Begin RLE strengthening and dynamic balance training.    Consulted and Agree with Plan of Care Patient;Family member/caregiver    Family Member Consulted Wife           Patient will benefit from skilled therapeutic intervention in order to improve the following deficits and impairments:  Abnormal gait, Decreased coordination, Difficulty walking, Decreased endurance, Decreased activity tolerance, Impaired perceived functional ability, Decreased balance, Decreased mobility, Decreased strength, Improper body mechanics  Visit Diagnosis: Hemiplegia and hemiparesis following cerebral infarction affecting right dominant side (HCC)  Other abnormalities of gait and mobility  Other lack of coordination  Muscle weakness (generalized)     Problem List Patient Active Problem List  Diagnosis Date Noted  . Stroke ([place]) 05/16/2020  . Acute focal neurological deficit 05/15/2020  . BPH (benign prostatic hyperplasia) 02/02/2016  . Fractured atrial pacemaker lead wire 07/18/2015  . 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 Chest pain 08/02/2013  . SOB (shortness of breath) 08/02/2013  . PVC (premature ventricular contraction) 02/13/2013  . Chest pain on exertion 02/25/2012  . Pacemaker-dual-chamber Medtronic 04/29/2011  .  Change in bowel habits 01/22/2011  . Weight loss 01/22/2011  . COUGH 09/01/2010  . Multiple sclerosis ([place])   . HOCM (hypertrophic obstructive cardiomyopathy) ([place])   . Right bundle branch block   . Complete heart block ([place]) 12/05/2007    Rosalita Levan, SPT 05/21/2020, 6:04 PM  [facility] [address] [place], [place], [address] Phone: [phone]   Fax:  [phone]  Name: Rodney Adkins MRN: [id number] Date of Birth: [birth year]

## 2020-05-24 ENCOUNTER — Ambulatory Visit: Payer: BC Managed Care – PPO | Admitting: Occupational Therapy

## 2020-05-24 ENCOUNTER — Encounter: Payer: Self-pay | Admitting: Occupational Therapy

## 2020-05-24 ENCOUNTER — Other Ambulatory Visit: Payer: Self-pay

## 2020-05-24 ENCOUNTER — Ambulatory Visit: Payer: BC Managed Care – PPO

## 2020-05-24 DIAGNOSIS — I69351 Hemiplegia and hemiparesis following cerebral infarction affecting right dominant side: Secondary | ICD-10-CM

## 2020-05-24 DIAGNOSIS — M6281 Muscle weakness (generalized): Secondary | ICD-10-CM

## 2020-05-24 DIAGNOSIS — R2689 Other abnormalities of gait and mobility: Secondary | ICD-10-CM

## 2020-05-24 DIAGNOSIS — R41840 Attention and concentration deficit: Secondary | ICD-10-CM

## 2020-05-24 DIAGNOSIS — R278 Other lack of coordination: Secondary | ICD-10-CM

## 2020-05-24 NOTE — Therapy (Signed)
[facility] [address] [place], [place], [address] Phone: [phone]   Fax:  [phone]  Physical Therapy Treatment  Patient Details  Name: Rodney Adkins MRN: [id number] Date of Birth: [birth year] Referring Provider (PT): Otto Herb, MD   Encounter Date: 05/24/2020   PT End of Session - 05/24/20 1454    Visit Number 2    Number of Visits 17    Authorization Type BCBS PPO - FOTO patient    PT Start Time 1231    PT Stop Time 1315    PT Time Calculation (min) 44 min    Equipment Utilized During Treatment Gait belt    Activity Tolerance Patient tolerated treatment well    Behavior During Therapy WFL for tasks assessed/performed           Past Medical History:  Diagnosis Date  . Complete heart block [facility]) June 2009   a. Presented with hypotension/pulm edema 12/2007 when HOCM was also discovered. s/p dual chamber MDT pacemaker. Thallium stest 7/09 negative for infiltrative disease or ischemia.  Followed Dr [name]  Marland Kitchen Heart murmur   . HOCM (hypertrophic obstructive cardiomyopathy) ([place]) 2009   Discovered by ECHO when he developed CHB. Mgmt Dr Caryl Comes  and Dr [name] at [facility]. Has outflow obstruction on ECHO 2011  . Multiple sclerosis ([place]) 1998   Sxs started 1998 with L body numbness. MRI c/w MS. Started Avonex. Saw Dr [name] at [facility] and switched to Betaseron and was on for 5 yrs but developed depression and site rxns and D/C'd 2006. While on Betaseron, occassionally required solumedrol for flares.  . Pacemaker 2017  . Prostate enlargement   . Right bundle branch block   . SOB (shortness of breath)    a. PFTs 09/2010 essentially normal per pulm note (mild obst defect). b. 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  . 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  Pacer reprogrammed with improvement in sx.    Past Surgical History:  Procedure Laterality Date  . LEFT HEART CATHETERIZATION WITH CORONARY ANGIOGRAM N/A 08/22/2013   Procedure: LEFT HEART CATHETERIZATION WITH CORONARY ANGIOGRAM;  Surgeon: [name], MD;  Location: [facility] CATH LAB;  Service: Cardiovascular;  Laterality: N/A;  . LEG TENDON SURGERY  2008   Os peroneus resection and peroneal tendon repair  . pace Agricultural engineer    . PACEMAKER INSERTION  12/2007   Complete heart block  . PACEMAKER LEAD REMOVAL Left 07/18/2015   Procedure: ATRIAL PACEMAKER LEAD EXTRACTION; ATRIAL PACEMAKER LEAD INSERTION; GENERATOR REPLACEMENT.;  Surgeon: [name], MD;  Location: [facility];  Service: Cardiovascular;  Laterality: Left;  [name] to back up  . PERIPHERAL VASCULAR CATHETERIZATION N/A 02/12/2015   Procedure: Venogram;  Surgeon: [name], MD;  Location: [facility] CV LAB;  Service: Cardiovascular;  Laterality: N/A;  . TEE WITHOUT CARDIOVERSION N/A 07/18/2015   Procedure: TRANSESOPHAGEAL ECHOCARDIOGRAM (TEE);  Surgeon: [name], MD;  Location: [facility] OR;  Service: Cardiovascular;  Laterality: N/A;    There were no vitals filed for this visit.   Subjective Assessment - 05/24/20 1236    Subjective Pt reports his R leg is weaker today. No pain or falls. Pt reports walking with cane outdoors by himself (first with stick and then found cane). Pt wants to try cane or rollator for outdoor  ambulation.    Patient is accompained by: Family member    Pertinent History MS, CHB s/p PPM, HOCM    Limitations Walking;Writing;Standing;Lifting;House hold activities    Currently in Pain? No/denies                             [facility] Adult PT Treatment/Exercise - 05/24/20 1321      Transfers   Transfers Sit to Stand;Stand to Sit    Sit to Stand 5: Supervision    Stand to Sit 5: Supervision      Ambulation/Gait   Ambulation/Gait Yes    Ambulation/Gait Assistance 4: Min guard     Ambulation/Gait Assistance Details Pt cued on proper heel strike and taking bigger step with LLE.    Ambulation Distance (Feet) 690 Feet    Assistive device Straight cane   with quad tip   Gait Pattern Step-through pattern;Decreased dorsiflexion - right;Decreased dorsiflexion - left;Decreased step length - right;Decreased step length - left;Decreased arm swing - right;Poor foot clearance - right;Decreased hip/knee flexion - right    Ambulation Surface Level;Indoor    Stairs Yes    Stairs Assistance 5: Supervision    Stairs Assistance Details (indicate cue type and reason) Pt cued on proper foot positioning in order to prevent heel from scraping against step with descent.    Stair Management Technique Two rails    Number of Stairs 8    Height of Stairs 6      Neuro Re-ed    Neuro Re-ed Details  Pt cued on alternating step ups on 4" step with LUE support x10 rep bilaterally. Pt with most difficulty with R hip flexion going up and extension stepping back down but improved throughout activity. PT to assist with R foot placement at times. Lateral stepping onto 6" step in both directions with BUE support and CGA. Forward stepping over 3 2x4" foam beams with cues for taking bigger stepping and fully clearing obstacle with trailing limb. Rockerboard traing with LUE support only including balancing x 1 min and rocking side to side for 1 min for increasing R weight shifting while maintaining upright posture. Pt then instructed in side-stepping x 4 trials in // bars with LUE support and CGA.       Exercises   Exercises Other Exercises    Other Exercises  Functional squats x 10 reps with BUE support and cues for proper mechanics. Pt also instructed in single-leg mini squats on edge of curb with pt in R SLS with cues to tap L toe on ground and back up. Pt demos ability to complete x10 reps with CGA-min A with 1 minor LOB requiring min A to correct.                  PT Education - 05/24/20 1453     Education Details Pt educated on initial HEP and walking program. PT educated wife and pt on appropriate cues to ensure proper gait and exercise performance. PT cued wife on cueing husband about relaxing R arm to promote increased arm swing.    Person(s) Educated Patient;Spouse    Methods Explanation;Demonstration;Handout;Verbal cues    Comprehension Verbalized understanding            PT Short Term Goals - 05/21/20 1755      PT SHORT TERM GOAL #1   Title Pt will be independent with initial HEP.    Baseline NA    Time 4  Period Weeks    Status New    Target Date 06/18/20      PT SHORT TERM GOAL #2   Title Pt will decrease 5xSTS to </= 15 seconds for decreased fall risk.    Baseline 22 s without UE support from standard arm chair 05/21/20    Time 4    Period Weeks    Status New    Target Date 06/18/20      PT SHORT TERM GOAL #3   Title Pt will increase gait speed to >/= 0.75 m/s for increased access to community and decreased fall risk.    Baseline 0.65 m/s with RW 05/21/20    Time 4    Period Weeks    Status New    Target Date 06/18/20      PT SHORT TERM GOAL #4   Title Pt will increase Berg score by 4 points in order to decrease fall risk and increase static and dynamic balance by a clincal detectable change (4 points = CDC).    Baseline 45/56 ind mod fall risk 05/21/20    Time 4    Period Weeks    Status New    Target Date 06/18/20             PT Long Term Goals - 05/21/20 1759      PT LONG TERM GOAL #1   Title Pt will be independent with progressive HEP.    Baseline NA    Time 8    Period Weeks    Status New    Target Date 07/16/20      PT LONG TERM GOAL #2   Title Pt will decrease 5xSTS to </= 12 seconds to no longer be classified as fall risk and to be WNL for age and gender.    Baseline 22 s from standard arm chair without UE support    Time 8    Period Weeks    Status New    Target Date 07/16/20      PT LONG TERM GOAL #3   Title Pt will  reach >50/56 on Berg balance score for improved static and dynamic balance and decreased risk for falls.    Baseline 45/56 (mod fall risk) 05/21/20    Time 8    Period Weeks    Status New    Target Date 07/16/20      PT LONG TERM GOAL #4   Title Pt will reach 1.0 m/s gait speed for community ambulation and decreased fall risk.    Baseline 0.65 m/s    Time 8    Period Weeks    Status New    Target Date 07/16/20      PT LONG TERM GOAL #5   Title Pt will ambulated 800+ feet on outdoor/unlevel surfaces with supervision and LRAD.    Baseline NT    Time 8    Period Weeks    Status New    Target Date 07/16/20      Additional Long Term Goals   Additional Long Term Goals Yes      PT LONG TERM GOAL #6   Title Pt will increase FOTO from 46 to >/= 70 for increased confidence in abilities.    Baseline 46 05/21/20    Time 8    Period Weeks    Status New    Target Date 07/16/20                 Plan - 05/24/20  1456    Clinical Impression Statement Pt is doing very well with R weight-shifting and reciprocal stepping at this time. PT instructed pt in gait training with straight cane+quad tip in which pt exhibited good sequencing, reciprocal stepping pattern and good weight-shifting onto RLE. Pt with good stability with lower level balance/NMR exercises so PT progressed to higher-level balance exercises on compliant surfaces and with increased SLS stability required. Pt is expected to make excellent progress toward goals.    Personal Factors and Comorbidities Comorbidity 3+;Age    Comorbidities MS, CHB s/p PPM, HOCM    Examination-Activity Limitations Squat;Locomotion Level;Transfers;Stairs;Stand    Examination-Participation Restrictions Yard Work;Occupation;Driving;Community Activity    Stability/Clinical Decision Making Evolving/Moderate complexity    Rehab Potential Good    PT Frequency 2x / week    PT Duration 8 weeks    PT Treatment/Interventions Gait training;Stair  training;Functional mobility training;Therapeutic activities;Therapeutic exercise;Balance training;Neuromuscular re-education;Patient/family education;Orthotic Fit/Training;DME Instruction;ADLs/Self Care Home Management;Energy conservation;Manual techniques;Vestibular    PT Next Visit Plan Continue RLE strengthening and higher-level dynamic balance training. Outdoor gait training if weather permits. Add to HEP as pt progresses.    Consulted and Agree with Plan of Care Patient;Family member/caregiver    Family Member Consulted Wife           Patient will benefit from skilled therapeutic intervention in order to improve the following deficits and impairments:  Abnormal gait, Decreased coordination, Difficulty walking, Decreased endurance, Decreased activity tolerance, Impaired perceived functional ability, Decreased balance, Decreased mobility, Decreased strength, Improper body mechanics  Visit Diagnosis: Hemiplegia and hemiparesis following cerebral infarction affecting right dominant side (HCC)  Other abnormalities of gait and mobility  Muscle weakness (generalized)     Problem List Patient Active Problem List   Diagnosis Date Noted  . Stroke ([place]) 05/16/2020  . Acute focal neurological deficit 05/15/2020  . BPH (benign prostatic hyperplasia) 02/02/2016  . Fractured atrial pacemaker lead wire 07/18/2015  . 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 Chest pain 08/02/2013  . SOB (shortness of breath) 08/02/2013  . PVC (premature ventricular contraction) 02/13/2013  . Chest pain on exertion 02/25/2012  . Pacemaker-dual-chamber [employer] 04/29/2011  . Change in bowel habits 01/22/2011  . Weight loss 01/22/2011  . COUGH 09/01/2010  . Multiple sclerosis ([place])   . HOCM (hypertrophic obstructive cardiomyopathy) ([place])   . Right bundle branch block   . Complete heart block ([place]) 12/05/2007    Rosalita Levan, SPT 05/24/2020, 3:03 PM  [facility] [address] [place], [place], [address] Phone: [phone]   Fax:  [phone]  Name: FENTON CANDEE MRN: [id number] Date of Birth: 06-Jun-1956

## 2020-05-24 NOTE — Therapy (Signed)
[place] [address] [place], [place], [address] Phone: [phone]   Fax:  [phone]  Occupational Therapy Evaluation  Patient Details  Name: Rodney Adkins MRN: [id number] Date of Birth: Dec 30, 1955 Referring Provider (OT): Darliss Cheney, MD   Encounter Date: 05/24/2020   OT End of Session - 05/24/20 1630    Visit Number 1    Number of Visits 17    Date for OT Re-Evaluation 07/19/20    Authorization Type State BCBS    Authorization Time Period VL:MN    OT Start Time 1315    OT Stop Time 1400    OT Time Calculation (min) 45 min    Activity Tolerance Patient tolerated treatment well    Behavior During Therapy [facility] for tasks assessed/performed           Past Medical History:  Diagnosis Date   Complete heart block [facility]) June 2009   a. Presented with hypotension/pulm edema 12/2007 when HOCM was also discovered. s/p dual chamber MDT pacemaker. Thallium stest 7/09 negative for infiltrative disease or ischemia.  Followed Dr [name]   Heart murmur    HOCM (hypertrophic obstructive cardiomyopathy) ([place]) 2009   Discovered by ECHO when he developed CHB. Mgmt Dr Caryl Comes  and Dr [name] at [facility]. Has outflow obstruction on ECHO 2011   Multiple sclerosis ([place]) 1998   Sxs started 1998 with L body numbness. MRI c/w MS. Started Avonex. Saw Dr [name] at [facility] and switched to Betaseron and was on for 5 yrs but developed depression and site rxns and D/C'd 2006. While on Betaseron, occassionally required solumedrol for flares.   Pacemaker 2017   Prostate enlargement    Right bundle branch block    SOB (shortness of breath)    a. PFTs 09/2010 essentially normal per pulm note (mild obst defect). b. 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   Wenckebach    a. Decreased ex tolerance 2010/2012 - underwent stress echo to look @ effects of MV/LVOT - nothing noted; although pt had high rate Wenckebach behavior with associated  ex intol. Pacer reprogrammed with improvement in sx.    Past Surgical History:  Procedure Laterality Date   LEFT HEART CATHETERIZATION WITH CORONARY ANGIOGRAM N/A 08/22/2013   Procedure: LEFT HEART CATHETERIZATION WITH CORONARY ANGIOGRAM;  Surgeon: [name], MD;  Location: [facility] CATH LAB;  Service: Cardiovascular;  Laterality: N/A;   LEG TENDON SURGERY  2008   Os peroneus resection and peroneal tendon repair   pace maker     PACEMAKER INSERTION  12/2007   Complete heart block   PACEMAKER LEAD REMOVAL Left 07/18/2015   Procedure: ATRIAL PACEMAKER LEAD EXTRACTION; ATRIAL PACEMAKER LEAD INSERTION; GENERATOR REPLACEMENT.;  Surgeon: [name], MD;  Location: [place];  Service: Cardiovascular;  Laterality: Left;  [name] to back up   PERIPHERAL VASCULAR CATHETERIZATION N/A 02/12/2015   Procedure: Venogram;  Surgeon: [name], MD;  Location: [place] CV LAB;  Service: Cardiovascular;  Laterality: N/A;   TEE WITHOUT CARDIOVERSION N/A 07/18/2015   Procedure: TRANSESOPHAGEAL ECHOCARDIOGRAM (TEE);  Surgeon: [name], MD;  Location[facility] OR;  Service: Cardiovascular;  Laterality: N/A;    There were no vitals filed for this visit.   Subjective Assessment - 05/24/20 1346    Subjective  Pt denies any pain. Says pain occurs when sleeping on the right side. Pt is a 64 year old male that presents to neuro OPOT s/p CVA. Pt with PMH of MS.  Patient is accompanied by: Family member   spouse   Pertinent History Fall Risk. Speech Deficits.    Limitations MS, CHB s/p PPM, HOCM    Patient Stated Goals right hand. 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 Return to driving recommendations.    Currently in Pain? No/denies             [facility] OT Assessment - 05/24/20 1319      Assessment   Medical Diagnosis Acute CVA    Referring Provider (OT) Darliss Cheney, MD    Onset Date/Surgical Date 05/14/20    Hand Dominance Right    Prior Therapy None       Precautions   Precautions Fall;ICD/Pacemaker      Restrictions   Weight Bearing Restrictions No      Balance Screen   Has the patient fallen in the past 6 months Yes   seeing PT     [place] expects to be discharged to: Private residence    Living Arrangements Spouse/significant other    Available Help at Discharge Family    Type of [place] One level    [place] - Number of Steps 5    Bathroom Shower/Tub Tub/Shower unit    [place] - 2 wheels;Bedside commode    Lives With Spouse      Prior Function   Level of Independence Independent    Vocation Full time employment    Vocation Requirements Professor   [employer] Gardening, Walking      ADL   Eating/Feeding Needs assist with cutting food    Grooming Use of adaptive equipment   increased cuts with shaving - maybe getting electric shaver   Upper Body Bathing Supervision/safety    Lower Body Bathing Supervision/safety    Upper Body Dressing Needs assist for fasteners    Lower Body Dressing Increased time    Toilet Transfer Modified independent    Toileting - Clothing Manipulation Modified independent    Toileting -  Solicitor --   uses BSC in shower     IADL   Shopping Needs to be accompanied on any shopping trip    [place] alone or with occasional assistance;Needs help with all home maintenance tasks   folding sheets, stands and does dishes, pulled sheets off be   Prior Level of Function Meal Prep spouse does most cooking - prior    Meal Prep Able to complete simple warm meal prep;Able to complete simple cold meal and snack prep    Prior Level of Function Community Mobility driving prior    [place] on family or friends for transportation    Medication Management Is responsible  for taking medication in correct dosages at correct time    Physiological scientist day-to-day purchases, but needs help with banking, major purchases, etc.;Requires assistance      Written Expression   Dominant Hand Right    Handwriting 50% legible;Severe micrographia    Written Experience Exceptions to [place] Wears glasses for distance only    Additional Comments pt denies any changes to vision s/p CVA but will continue to assess in functional context.      Cognition   Overall Cognitive  Status Within Functional Limits for tasks assessed      Observation/Other Assessments   Focus on Therapeutic Outcomes (FOTO)  46      Sensation   Light Touch Appears Intact    Hot/Cold Appears Intact      Coordination   Finger Nose Finger Test RUE undershoots    9 Hole Peg Test Right;Left    Right 9 Hole Peg Test 71.06    Left 9 Hole Peg Test 36.54      Edema   Edema minimal edema noted RUE      ROM / Strength   AROM / PROM / Strength AROM;Strength      AROM   Overall AROM  Within functional limits for tasks performed      Strength   Overall Strength Within functional limits for tasks performed    Overall Strength Comments Right weaker than Left but functional      Hand Function   Right Hand Gross Grasp Functional    Right Hand Grip (lbs) 45.4    Left Hand Gross Grasp Functional    Left Hand Grip (lbs) 65.6                           OT Education - 05/24/20 1353    Education Details Education on role and purpose of OT. Coordination HEP - see pt instructions    Person(s) Educated Patient;Spouse    Methods Explanation;Demonstration    Comprehension Verbalized understanding;Returned demonstration            OT Short Term Goals - 05/24/20 1640      OT SHORT TERM GOAL #1   Title Pt will be independent with HEP 06/21/20    Time 4    Period Weeks    Status On-going    Target Date 06/21/20      OT SHORT TERM GOAL #2    Title Pt will demonstrate improved fine motor coordination by demonstrating decrease in 9 hole peg test score by 6 seconds with RUE.    Baseline RUE 71.06    Time 4    Period Weeks    Status New      OT SHORT TERM GOAL #3   Title Pt will perform simple warm meal prep or home management task with supervision and good safety awareness.    Time 4    Period Weeks    Status New      OT SHORT TERM GOAL #4   Title Pt will write 2-3 sentences with 75% legibility and increased size of letters (mild micrographia).    Baseline severe micrographia    Time 4    Period Weeks    Status New      OT SHORT TERM GOAL #5   Title Pt will demonstrate increase in grip strength by 5 lbs in RUE for increased ease with ADLs and clothing management, etc.    Baseline RUE 45.4 (LUE 65.6)    Time 4    Period Weeks    Status New      OT SHORT TERM GOAL #6   Title Pt will report using RUE (dominant hand) for 25% or more of functional tasks.    Time 4    Period Weeks    Status New             OT Long Term Goals - 05/24/20 1651      OT LONG TERM GOAL #1   Title  Pt will be independent with updated HEP 07/19/2020    Time 8    Period Weeks    Status New    Target Date 07/19/20      OT LONG TERM GOAL #2   Title Pt will demonstrate improved fine motor coordination by demonstrating decrease in 9 hole peg test score by 15 seconds with RUE.    Time 8    Period Weeks    Status New      OT LONG TERM GOAL #3   Title Pt will perform work simulated tasks (typing, using mouse, etc) with RUE as dominant hand with 90% accuracy.    Time 8    Period Weeks    Status New      OT LONG TERM GOAL #4   Title Pt will report completing at least 1 home management task per day with mod I in order to decrease caregiver burden.    Time 8    Period Weeks    Status New      OT LONG TERM GOAL #5   Title Pt will improve grip strength by at least 10 lbs in RUE for strengthening dominant hand for functional use and  tasks.    Baseline RUE 45.4 (LUE 65.6)    Time 8    Period Weeks    Status New      OT LONG TERM GOAL #6   Title Pt will report using RUE (dominant hand) for at least 75% of functional tasks per day.    Time 8    Period Weeks    Status New      OT LONG TERM GOAL #7   Title Pt will perform physical and cognitive tasks simultaneously with 95% accuracy for returning to prior tasks.    Time 8    Period Weeks    Status New      OT LONG TERM GOAL #8   Title Pt will write 3-5 sentences with 90% legibility and appropriate sizing.    Time 8    Period Weeks    Status New                 Plan - 05/24/20 1634    Clinical Impression Statement Pt is a 64 year old male that presents to neuro outpatient OT s/p CVA on 05/14/2020. Pt has PMH of RRMS and suffered an acute stroke to corona radiata. Pt's last exacerbation of RRMS was earlier this year (could not specify date/month). Pt is having issues with R hand coordination/weakness, RLE weakness,  difficulty walking, speaking and swallowing. Prior to acute CVA, pt was independent and did not use AD. Pt has always struggled with some R foot drop and R-sided weakness d/t MS. Pt presents with deficits with RUE coordination, strength, and unsteadiness on feet decreasing independence with ADLs and IADLs. Skilled OT is recommended to target these deficits in order to increase independence and decrease caregiver burden.    OT Occupational Profile and History Detailed Assessment- Review of Records and additional review of physical, cognitive, psychosocial history related to current functional performance    Occupational performance deficits (Please refer to evaluation for details): IADL's;ADL's;Social Participation;Leisure;Work;Rest and Sleep    Body Structure / Function / Physical Skills ADL;Dexterity;Decreased knowledge of use of DME;Strength;UE functional use;Endurance;IADL;Coordination;FMC    Rehab Potential Good    Clinical Decision Making Limited  treatment options, no task modification necessary    Comorbidities Affecting Occupational Performance: None    Modification or Assistance to Complete  Evaluation  No modification of tasks or assist necessary to complete eval    OT Frequency 2x / week    OT Duration 8 weeks   or 16 visits (+ eval) over extended time   OT Treatment/Interventions Self-care/ADL training;DME and/or AE instruction;Gait Training;Therapeutic activities;Balance training;Cognitive remediation/compensation;Therapeutic exercise;Neuromuscular education;Passive range of motion;Visual/perceptual remediation/compensation;Patient/family education;Energy conservation    Plan RUE coordination, grip strengthening    OT Home Exercise Plan coordination HEP    Consulted and Agree with Plan of Care Patient;Family member/caregiver   spouse   Family Member Consulted spouse           Patient will benefit from skilled therapeutic intervention in order to improve the following deficits and impairments:   Body Structure / Function / Physical Skills: ADL, Dexterity, Decreased knowledge of use of DME, Strength, UE functional use, Endurance, IADL, Coordination, [facility]       Visit Diagnosis: Hemiplegia and hemiparesis following cerebral infarction affecting right dominant side ([place]) - Plan: Ot plan of care cert/re-cert  Other abnormalities of gait and mobility - Plan: Ot plan of care cert/re-cert  Other lack of coordination - Plan: Ot plan of care cert/re-cert  Muscle weakness (generalized) - Plan: Ot plan of care cert/re-cert  Attention and concentration deficit - Plan: Ot plan of care cert/re-cert    Problem List Patient Active Problem List   Diagnosis Date Noted   Stroke [facility]) 05/16/2020   Acute focal neurological deficit 05/15/2020   BPH (benign prostatic hyperplasia) 02/02/2016   Fractured atrial pacemaker lead wire 07/18/2015   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 [address] [place], [place], [address] Phone: [phone]   Fax:  [phone]  Name: Rodney Adkins MRN: [id number] Date of Birth: 10-27-1955

## 2020-05-24 NOTE — Patient Instructions (Signed)
Access Code: [email or web address] URL: [email or web address] Date: 05/24/2020 Prepared by: Cherly Anderson  Exercises Side Stepping with Counter Support - 2 x daily - 7 x weekly - 1 sets - 4 reps Mini Squat with Counter Support - 2 x daily - 5 x weekly - 2 sets - 10 reps Standing Marching - 2 x daily - 5 x weekly - 2 sets - 10 reps Tandem Stance - 2 x daily - 5 x weekly - 1 sets - 3 reps - 20 sec hold

## 2020-05-24 NOTE — Patient Instructions (Addendum)
Coordination Activities  Perform the following activities for 20 minutes 2 times per day with right hand(s).  Pick up bottle caps or poker chips, etc and place in a container   Stack cups with right hand  USE BEAN BAG IN PLACE OF BALL Toss ball between hands. Toss ball in air and catch with the same hand. Flip cards 1 at a time as fast as you can. Deal cards with your thumb (Hold deck in hand and push card off top with thumb). Pick up coins, buttons, marbles, dried beans/pasta of different sizes and place in container. Pick up coins and place in container or coin bank. Pick up coins and stack.

## 2020-05-27 ENCOUNTER — Encounter: Payer: Self-pay | Admitting: Physical Therapy

## 2020-05-27 ENCOUNTER — Other Ambulatory Visit: Payer: Self-pay

## 2020-05-27 ENCOUNTER — Ambulatory Visit: Payer: BC Managed Care – PPO | Admitting: Physical Therapy

## 2020-05-27 VITALS — BP 108/63 | HR 94

## 2020-05-27 DIAGNOSIS — R2689 Other abnormalities of gait and mobility: Secondary | ICD-10-CM

## 2020-05-27 DIAGNOSIS — I69351 Hemiplegia and hemiparesis following cerebral infarction affecting right dominant side: Secondary | ICD-10-CM

## 2020-05-27 DIAGNOSIS — M6281 Muscle weakness (generalized): Secondary | ICD-10-CM

## 2020-05-27 DIAGNOSIS — R278 Other lack of coordination: Secondary | ICD-10-CM

## 2020-05-27 NOTE — Therapy (Signed)
[facility] [address] [place], [place], [address] Phone: [phone]   Fax:  [phone]  Physical Therapy Treatment  Patient Details  Name: Rodney Adkins MRN: [id number] Date of Birth: 11-15-1955 Referring Provider (PT): Otto Herb, MD   Encounter Date: 05/27/2020   PT End of Session - 05/27/20 1421    Visit Number 3    Number of Visits 17    Authorization Type BCBS PPO - FOTO patient    PT Start Time 1234    PT Stop Time 6962    PT Time Calculation (min) 44 min    Equipment Utilized During Treatment Gait belt    Activity Tolerance Patient tolerated treatment well    Behavior During Therapy [facility] for tasks assessed/performed           Past Medical History:  Diagnosis Date   Complete heart block [facility]) June 2009   a. Presented with hypotension/pulm edema 12/2007 when HOCM was also discovered. s/p dual chamber MDT pacemaker. Thallium stest 7/09 negative for infiltrative disease or ischemia.  Followed Dr [name]   Heart murmur    HOCM (hypertrophic obstructive cardiomyopathy) ([place]) 2009   Discovered by ECHO when he developed CHB. Mgmt Dr Caryl Comes  and Dr [name] at [facility]. Has outflow obstruction on ECHO 2011   Multiple sclerosis ([place]) 1998   Sxs started 1998 with L body numbness. MRI c/w MS. Started Avonex. Saw Dr [name] at [facility] and switched to Betaseron and was on for 5 yrs but developed depression and site rxns and D/C'd 2006. While on Betaseron, occassionally required solumedrol for flares.   Pacemaker 2017   Prostate enlargement    Right bundle branch block    SOB (shortness of breath)    a. PFTs 09/2010 essentially normal per pulm note (mild obst defect). b. 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  Pacer reprogrammed with improvement in sx.    Past Surgical History:  Procedure Laterality Date   LEFT HEART CATHETERIZATION WITH CORONARY ANGIOGRAM N/A 08/22/2013   Procedure: LEFT HEART CATHETERIZATION WITH CORONARY ANGIOGRAM;  Surgeon: [name], MD;  Location: [facility] CATH LAB;  Service: Cardiovascular;  Laterality: N/A;   LEG TENDON SURGERY  2008   Os peroneus resection and peroneal tendon repair   pace maker     PACEMAKER INSERTION  12/2007   Complete heart block   PACEMAKER LEAD REMOVAL Left 07/18/2015   Procedure: ATRIAL PACEMAKER LEAD EXTRACTION; ATRIAL PACEMAKER LEAD INSERTION; GENERATOR REPLACEMENT.;  Surgeon: [name], MD;  Location: [facility];  Service: Cardiovascular;  Laterality: Left;  [name] to back up   PERIPHERAL VASCULAR CATHETERIZATION N/A 02/12/2015   Procedure: Venogram;  Surgeon: [name], MD;  Location: [facility] CV LAB;  Service: Cardiovascular;  Laterality: N/A;   TEE WITHOUT CARDIOVERSION N/A 07/18/2015   Procedure: TRANSESOPHAGEAL ECHOCARDIOGRAM (TEE);  Surgeon: [name], MD;  Location: [facility] OR;  Service: Cardiovascular;  Laterality: N/A;    Vitals:   05/27/20 1240 05/27/20 1242 05/27/20 1244  BP: (!) 89/54 (!) 102/56 108/63  Pulse: 86 81 94     Subjective Assessment - 05/27/20 1237    Subjective Reports that his R leg and arm is feeling weaker today. No falls. Has been doing the exericses at home.  Patient is accompained by: Family member    Pertinent History MS, CHB s/p PPM, HOCM    Limitations Walking;Writing;Standing;Lifting;House hold activities    Currently in Pain? No/denies                             [facility] Adult PT Treatment/Exercise - 05/27/20 0001      Transfers   Transfers Sit to Stand;Stand to Sit    Sit to Stand 5: Supervision    Stand to Sit 5: Supervision    Transfer Cueing cues for proper UE placement from mat table and RW - pt with tendency to put BUE on RW, practiced x5 reps with RUE  support from table and LUE on RW, cues initially for scooting towards legs and making sure that legs are flat on the floor (as pt has tendency to have LLE posteriorly on tip toes)      Ambulation/Gait   Ambulation/Gait Yes    Ambulation/Gait Assistance 4: Min guard    Ambulation/Gait Assistance Details initial cues for sequencing and heel strike with RLE, tactile cues intermittently for R arm swing    Ambulation Distance (Feet) 230 Feet   x1, plus 2 x 60 throughout clinic   Assistive device Straight cane   with quad tip   Gait Pattern Step-through pattern;Decreased dorsiflexion - right;Decreased dorsiflexion - left;Decreased step length - right;Decreased step length - left;Decreased arm swing - right;Poor foot clearance - right;Decreased hip/knee flexion - right    Ambulation Surface Level;Indoor    Gait Comments provided pt with tennis balls to RW      Neuro Re-ed    Neuro Re-ed Details  standing with 2" block under LLE for incr weight shifting/bearing through RLE x10 reps sit <> stands with BUE support from mat table, and in standing cues to weight shift to RLE and lower down slowly to mat table               Balance Exercises - 05/27/20 0001      Balance Exercises: Standing   Standing Eyes Opened Narrow base of support (BOS);Foam/compliant surface;Limitations    Standing Eyes Opened Limitations on single pillow 2 x 10 reps head turns, 2 x 10 reps head nods    Standing Eyes Closed Wide (BOA);Limitations    Standing Eyes Closed Limitations beginning with wider BOS 2 x30 seconds, then more narrow BOS (feet hip width) 2 x 30 seconds with intermittent touch to the wall for balance    Tandem Stance Eyes open;Limitations    Tandem Stance Time solid surface with RLE posteriorly 2 x 30 seconds with intermittent taps to wall/chair for balance             PT Education - 05/27/20 1421    Education Details educated pt and pt's spouse to continue with current HEP as pt asking what other  exercises he can add from today.    Person(s) Educated Patient;Spouse    Methods Explanation    Comprehension Verbalized understanding            PT Short Term Goals - 05/21/20 1755      PT SHORT TERM GOAL #1   Title Pt will be independent with initial HEP.    Baseline NA    Time 4    Period Weeks    Status New    Target Date 06/18/20      PT SHORT TERM GOAL #2   Title Pt will  decrease 5xSTS to </= 15 seconds for decreased fall risk.    Baseline 22 s without UE support from standard arm chair 05/21/20    Time 4    Period Weeks    Status New    Target Date 06/18/20      PT SHORT TERM GOAL #3   Title Pt will increase gait speed to >/= 0.75 m/s for increased access to community and decreased fall risk.    Baseline 0.65 m/s with RW 05/21/20    Time 4    Period Weeks    Status New    Target Date 06/18/20      PT SHORT TERM GOAL #4   Title Pt will increase Berg score by 4 points in order to decrease fall risk and increase static and dynamic balance by a clincal detectable change (4 points = CDC).    Baseline 45/56 ind mod fall risk 05/21/20    Time 4    Period Weeks    Status New    Target Date 06/18/20             PT Long Term Goals - 05/21/20 1759      PT LONG TERM GOAL #1   Title Pt will be independent with progressive HEP.    Baseline NA    Time 8    Period Weeks    Status New    Target Date 07/16/20      PT LONG TERM GOAL #2   Title Pt will decrease 5xSTS to </= 12 seconds to no longer be classified as fall risk and to be WNL for age and gender.    Baseline 22 s from standard arm chair without UE support    Time 8    Period Weeks    Status New    Target Date 07/16/20      PT LONG TERM GOAL #3   Title Pt will reach >50/56 on Berg balance score for improved static and dynamic balance and decreased risk for falls.    Baseline 45/56 (mod fall risk) 05/21/20    Time 8    Period Weeks    Status New    Target Date 07/16/20      PT LONG TERM GOAL #4    Title Pt will reach 1.0 m/s gait speed for community ambulation and decreased fall risk.    Baseline 0.65 m/s    Time 8    Period Weeks    Status New    Target Date 07/16/20      PT LONG TERM GOAL #5   Title Pt will ambulated 800+ feet on outdoor/unlevel surfaces with supervision and LRAD.    Baseline NT    Time 8    Period Weeks    Status New    Target Date 07/16/20      Additional Long Term Goals   Additional Long Term Goals Yes      PT LONG TERM GOAL #6   Title Pt will increase FOTO from 46 to >/= 70 for increased confidence in abilities.    Baseline 46 05/21/20    Time 8    Period Weeks    Status New    Target Date 07/16/20                 Plan - 05/27/20 1423    Clinical Impression Statement Continued gait training today with SPC with quad tip, with pt needing min guard. Discussed with pt and wife to continue  using RW at all times until pt has more practice with cane. Pt challenged with corner balance exercises on pillow with more narrow BOS and with vision removed. Will continue to progress towards LTGs.    Personal Factors and Comorbidities Comorbidity 3+;Age    Comorbidities MS, CHB s/p PPM, HOCM    Examination-Activity Limitations Squat;Locomotion Level;Transfers;Stairs;Stand    Examination-Participation Restrictions Yard Work;Occupation;Driving;Community Activity    Stability/Clinical Decision Making Evolving/Moderate complexity    Rehab Potential Good    PT Frequency 2x / week    PT Duration 8 weeks    PT Treatment/Interventions Gait training;Stair training;Functional mobility training;Therapeutic activities;Therapeutic exercise;Balance training;Neuromuscular re-education;Patient/family education;Orthotic Fit/Training;DME Instruction;ADLs/Self Care Home Management;Energy conservation;Manual techniques;Vestibular    PT Next Visit Plan gait training with SPC with quad tip. corner balance exercises. Continue RLE strengthening and higher-level dynamic balance  training. Outdoor gait training if weather permits. Add to HEP as pt progresses.    Consulted and Agree with Plan of Care Patient;Family member/caregiver    Family Member Consulted Wife           Patient will benefit from skilled therapeutic intervention in order to improve the following deficits and impairments:  Abnormal gait, Decreased coordination, Difficulty walking, Decreased endurance, Decreased activity tolerance, Impaired perceived functional ability, Decreased balance, Decreased mobility, Decreased strength, Improper body mechanics  Visit Diagnosis: Other abnormalities of gait and mobility  Hemiplegia and hemiparesis following cerebral infarction affecting right dominant side (HCC)  Other lack of coordination  Muscle weakness (generalized)     Problem List Patient Active Problem List   Diagnosis Date Noted   Stroke ([place]) 05/16/2020   Acute focal neurological deficit 05/15/2020   BPH (benign prostatic hyperplasia) 02/02/2016   Fractured atrial pacemaker lead wire 07/18/2015   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 [place], [place], [address] Phone: [phone]   Fax:  [phone]  Name: DAVINE COBA MRN: [id number] Date of Birth: [birth year]

## 2020-05-28 DIAGNOSIS — F329 Major depressive disorder, single episode, unspecified: Secondary | ICD-10-CM | POA: Insufficient documentation

## 2020-06-05 ENCOUNTER — Ambulatory Visit: Payer: BC Managed Care – PPO | Attending: Family Medicine | Admitting: Physical Therapy

## 2020-06-05 ENCOUNTER — Ambulatory Visit: Payer: BC Managed Care – PPO | Admitting: Occupational Therapy

## 2020-06-05 ENCOUNTER — Other Ambulatory Visit: Payer: Self-pay

## 2020-06-05 ENCOUNTER — Encounter: Payer: Self-pay | Admitting: Physical Therapy

## 2020-06-05 DIAGNOSIS — M6281 Muscle weakness (generalized): Secondary | ICD-10-CM

## 2020-06-05 DIAGNOSIS — R471 Dysarthria and anarthria: Secondary | ICD-10-CM | POA: Insufficient documentation

## 2020-06-05 DIAGNOSIS — I69351 Hemiplegia and hemiparesis following cerebral infarction affecting right dominant side: Secondary | ICD-10-CM | POA: Diagnosis present

## 2020-06-05 DIAGNOSIS — R41840 Attention and concentration deficit: Secondary | ICD-10-CM | POA: Insufficient documentation

## 2020-06-05 DIAGNOSIS — R1310 Dysphagia, unspecified: Secondary | ICD-10-CM | POA: Diagnosis present

## 2020-06-05 DIAGNOSIS — R278 Other lack of coordination: Secondary | ICD-10-CM | POA: Diagnosis present

## 2020-06-05 DIAGNOSIS — R490 Dysphonia: Secondary | ICD-10-CM | POA: Insufficient documentation

## 2020-06-05 DIAGNOSIS — R2689 Other abnormalities of gait and mobility: Secondary | ICD-10-CM | POA: Insufficient documentation

## 2020-06-05 NOTE — Patient Instructions (Signed)
  Use Rt hand to:   1) Eat 2) practice writing 3) Wash face, help brush teeth, brush hair, and bathe 4) Wipe down tables/counters 5) Help fold towels and make sandwiches  Do NOT use Rt hand for anything: sharp, heavy, hot, or breakable   1. Grip Strengthening (Resistive Putty)   Squeeze putty using thumb and all fingers. Repeat _20___ times. Do __2__ sessions per day.   2. Roll putty into tube on table and pinch between each finger and thumb x 10 reps each. (can do ring and small finger together)

## 2020-06-05 NOTE — Therapy (Signed)
[facility] [address] [place], [place], [address] Phone: [phone]   Fax:  [phone]  Physical Therapy Treatment  Patient Details  Name: Rodney Adkins MRN: [id number] Date of Birth: 1955-09-14 Referring Provider (PT): Otto Herb, MD   Encounter Date: 06/05/2020   PT End of Session - 06/05/20 1207    Visit Number 4    Number of Visits 17    Authorization Type BCBS PPO - FOTO patient    PT Start Time 0932    PT Stop Time 1014    PT Time Calculation (min) 42 min    Equipment Utilized During Treatment Gait belt    Activity Tolerance Patient tolerated treatment well    Behavior During Therapy WFL for tasks assessed/performed           Past Medical History:  Diagnosis Date  . Complete heart block [facility]) June 2009   a. Presented with hypotension/pulm edema 12/2007 when HOCM was also discovered. s/p dual chamber MDT pacemaker. Thallium stest 7/09 negative for infiltrative disease or ischemia.  Followed Dr [name]  Marland Kitchen Heart murmur   . HOCM (hypertrophic obstructive cardiomyopathy) ([place]) 2009   Discovered by ECHO when he developed CHB. Mgmt Dr Caryl Comes  and Dr [name] at [facility]. Has outflow obstruction on ECHO 2011  . Multiple sclerosis ([place]) 1998   Sxs started 1998 with L body numbness. MRI c/w MS. Started Avonex. Saw Dr [name] at [facility] and switched to Betaseron and was on for 5 yrs but developed depression and site rxns and D/C'd 2006. While on Betaseron, occassionally required solumedrol for flares.  . Pacemaker 2017  . Prostate enlargement   . Right bundle branch block   . SOB (shortness of breath)    a. PFTs 09/2010 essentially normal per pulm note (mild obst defect). b. 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  . 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 Pacer  reprogrammed with improvement in sx.    Past Surgical History:  Procedure Laterality Date  . LEFT HEART CATHETERIZATION WITH CORONARY ANGIOGRAM N/A 08/22/2013   Procedure: LEFT HEART CATHETERIZATION WITH CORONARY ANGIOGRAM;  Surgeon: [name], MD;  Location: [facility] CATH LAB;  Service: Cardiovascular;  Laterality: N/A;  . LEG TENDON SURGERY  2008   Os peroneus resection and peroneal tendon repair  . pace Agricultural engineer    . PACEMAKER INSERTION  12/2007   Complete heart block  . PACEMAKER LEAD REMOVAL Left 07/18/2015   Procedure: ATRIAL PACEMAKER LEAD EXTRACTION; ATRIAL PACEMAKER LEAD INSERTION; GENERATOR REPLACEMENT.;  Surgeon: [name], MD;  Location: [facility];  Service: Cardiovascular;  Laterality: Left;  [name] to back up  . PERIPHERAL VASCULAR CATHETERIZATION N/A 02/12/2015   Procedure: Venogram;  Surgeon: [name], MD;  Location: [facility] CV LAB;  Service: Cardiovascular;  Laterality: N/A;  . TEE WITHOUT CARDIOVERSION N/A 07/18/2015   Procedure: TRANSESOPHAGEAL ECHOCARDIOGRAM (TEE);  Surgeon: [name], MD;  Location: [facility] OR;  Service: Cardiovascular;  Laterality: N/A;    There were no vitals filed for this visit.   Subjective Assessment - 06/05/20 0935    Subjective R side still feeling weak. No falls. Reports he feels like R toe has been curling.    Patient is accompained by: Family member    Pertinent History MS, CHB s/p PPM, HOCM  Limitations Walking;Writing;Standing;Lifting;House hold activities    Currently in Pain? No/denies                             [facility] Adult PT Treatment/Exercise - 06/05/20 0945      Ambulation/Gait   Ambulation/Gait Yes    Ambulation/Gait Assistance 4: Min guard    Ambulation/Gait Assistance Details cues for incr foot clearance with RLE and heel strike, initial cues for proper sequencing, during 2nd lap pt able to demo intermittent L arm swing, cues to relax hand at times    Ambulation Distance (Feet) 230 Feet     Assistive device Straight cane   with quad tip   Gait Pattern Step-through pattern;Decreased dorsiflexion - right;Decreased dorsiflexion - left;Decreased step length - right;Decreased step length - left;Decreased arm swing - right;Poor foot clearance - right;Decreased hip/knee flexion - right    Ambulation Surface Level;Indoor      Exercises   Exercises Other Exercises    Other Exercises  SciFit level 1.8 BLE only for 5 minutes for strengthening/activity tolerance -  HR after 110 bpm, O2 at 99%               Balance Exercises - 06/05/20 0001      Balance Exercises: Standing   SLS Eyes open;Solid surface;Limitations    SLS Limitations alternating toe taps to 6" step at bottom of stair case with fingertip support x 10 reps B    Rockerboard Anterior/posterior;EO;Limitations    Rockerboard Limitations weight shifting R/L with cues for proper technique x10 reps each side progressing to no UE support, holding board steady 2 x 30 seconds, x5 reps head nods, x5 reps head turns - with UE touch to bars for balance    Step Ups Forward;UE support 2;Limitations    Step Ups Limitations with RLE leading- cues for incr foot clearance on and off step, x10 reps    Sidestepping Foam/compliant support;Upper extremity support;3 reps;Limitations    Sidestepping Limitations down and back on blue foam beam with fingertip support, cues for incr R foot clearance     Other Standing Exercises standing on blue foam beam: stepping LLE off backwards and back on x15 reps for incr SLS/stance time on RLE, single UE support               PT Short Term Goals - 05/21/20 1755      PT SHORT TERM GOAL #1   Title Pt will be independent with initial HEP.    Baseline NA    Time 4    Period Weeks    Status New    Target Date 06/18/20      PT SHORT TERM GOAL #2   Title Pt will decrease 5xSTS to </= 15 seconds for decreased fall risk.    Baseline 22 s without UE support from standard arm chair 05/21/20    Time 4      Period Weeks    Status New    Target Date 06/18/20      PT SHORT TERM GOAL #3   Title Pt will increase gait speed to >/= 0.75 m/s for increased access to community and decreased fall risk.    Baseline 0.65 m/s with RW 05/21/20    Time 4    Period Weeks    Status New    Target Date 06/18/20      PT SHORT TERM GOAL #4   Title Pt will increase Berg score  by 4 points in order to decrease fall risk and increase static and dynamic balance by a clincal detectable change (4 points = CDC).    Baseline 45/56 ind mod fall risk 05/21/20    Time 4    Period Weeks    Status New    Target Date 06/18/20             PT Long Term Goals - 05/21/20 1759      PT LONG TERM GOAL #1   Title Pt will be independent with progressive HEP.    Baseline NA    Time 8    Period Weeks    Status New    Target Date 07/16/20      PT LONG TERM GOAL #2   Title Pt will decrease 5xSTS to </= 12 seconds to no longer be classified as fall risk and to be WNL for age and gender.    Baseline 22 s from standard arm chair without UE support    Time 8    Period Weeks    Status New    Target Date 07/16/20      PT LONG TERM GOAL #3   Title Pt will reach >50/56 on Berg balance score for improved static and dynamic balance and decreased risk for falls.    Baseline 45/56 (mod fall risk) 05/21/20    Time 8    Period Weeks    Status New    Target Date 07/16/20      PT LONG TERM GOAL #4   Title Pt will reach 1.0 m/s gait speed for community ambulation and decreased fall risk.    Baseline 0.65 m/s    Time 8    Period Weeks    Status New    Target Date 07/16/20      PT LONG TERM GOAL #5   Title Pt will ambulated 800+ feet on outdoor/unlevel surfaces with supervision and LRAD.    Baseline NT    Time 8    Period Weeks    Status New    Target Date 07/16/20      Additional Long Term Goals   Additional Long Term Goals Yes      PT LONG TERM GOAL #6   Title Pt will increase FOTO from 46 to >/= 70 for  increased confidence in abilities.    Baseline 46 05/21/20    Time 8    Period Weeks    Status New    Target Date 07/16/20                 Plan - 06/05/20 1208    Clinical Impression Statement Continued to practice gait training today with SPC with quad tip -pt needing min guard/supervision and able to demo brief periods of LUE arm swing during gait. Remainder of session focused on BLE strengthening and balance with focus on compliant surfaces and SLS. Will continue to progress towards LTGs.    Personal Factors and Comorbidities Comorbidity 3+;Age    Comorbidities MS, CHB s/p PPM, HOCM    Examination-Activity Limitations Squat;Locomotion Level;Transfers;Stairs;Stand    Examination-Participation Restrictions Yard Work;Occupation;Driving;Community Activity    Stability/Clinical Decision Making Evolving/Moderate complexity    Rehab Potential Good    PT Frequency 2x / week    PT Duration 8 weeks    PT Treatment/Interventions Gait training;Stair training;Functional mobility training;Therapeutic activities;Therapeutic exercise;Balance training;Neuromuscular re-education;Patient/family education;Orthotic Fit/Training;DME Instruction;ADLs/Self Care Home Management;Energy conservation;Manual techniques;Vestibular    PT Next Visit Plan add to HEP as appropriate  for balance. gait training with SPC with quad tip. corner balance exercises. Continue RLE strengthening and higher-level dynamic balance training. Outdoor gait training if weather permits. SciFit/NuStep    Consulted and Agree with Plan of Care Patient;Family member/caregiver    Family Member Consulted Wife           Patient will benefit from skilled therapeutic intervention in order to improve the following deficits and impairments:  Abnormal gait, Decreased coordination, Difficulty walking, Decreased endurance, Decreased activity tolerance, Impaired perceived functional ability, Decreased balance, Decreased mobility, Decreased  strength, Improper body mechanics  Visit Diagnosis: Other abnormalities of gait and mobility  Hemiplegia and hemiparesis following cerebral infarction affecting right dominant side (HCC)  Other lack of coordination  Muscle weakness (generalized)     Problem List Patient Active Problem List   Diagnosis Date Noted  . Stroke ([place]) 05/16/2020  . Acute focal neurological deficit 05/15/2020  . BPH (benign prostatic hyperplasia) 02/02/2016  . Fractured atrial pacemaker lead wire 07/18/2015  . 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 Chest pain 08/02/2013  . SOB (shortness of breath) 08/02/2013  . PVC (premature ventricular contraction) 02/13/2013  . Chest pain on exertion 02/25/2012  . Pacemaker-dual-chamber Medtronic 04/29/2011  . Change in bowel habits 01/22/2011  . Weight loss 01/22/2011  . COUGH 09/01/2010  . Multiple sclerosis ([place])   . HOCM (hypertrophic obstructive cardiomyopathy) ([place])   . Right bundle branch block   . Complete heart block ([place]) 12/05/2007    Arliss Journey, PT, DPT  06/05/2020, 12:12 PM  [facility] [address] [place], [place], [address] Phone: [phone]   Fax:  [phone]  Name: Rodney Adkins MRN: [id number] Date of Birth: Jan 13, 1956

## 2020-06-05 NOTE — Therapy (Signed)
[facility] [address] [place], [place], [address] Phone: [phone]   Fax:  [phone]  Occupational Therapy Treatment  Patient Details  Name: RESHARD GUILLET MRN: [id number] Date of Birth: Jun 28, 1956 Referring Provider (OT): Darliss Cheney, MD   Encounter Date: 06/05/2020   OT End of Session - 06/05/20 1053    Visit Number 2    Number of Visits 17    Date for OT Re-Evaluation 07/19/20    Authorization Type State BCBS    Authorization Time Period [id number]    OT Start Time 1017    OT Stop Time 1100    OT Time Calculation (min) 43 min    Activity Tolerance Patient tolerated treatment well    Behavior During Therapy [facility] for tasks assessed/performed           Past Medical History:  Diagnosis Date  . Complete heart block [facility]) June 2009   a. Presented with hypotension/pulm edema 12/2007 when HOCM was also discovered. s/p dual chamber MDT pacemaker. Thallium stest 7/09 negative for infiltrative disease or ischemia.  Followed Dr [name]  Marland Kitchen Heart murmur   . HOCM (hypertrophic obstructive cardiomyopathy) ([place]) 2009   Discovered by ECHO when he developed CHB. Mgmt Dr Caryl Comes  and Dr [name] at [facility] Has outflow obstruction on ECHO 2011  . Multiple sclerosis ([place]) 1998   Sxs started 1998 with L body numbness. MRI c/w MS. Started Avonex. Saw Dr [name] at [facility] and switched to Betaseron and was on for 5 yrs but developed depression and site rxns and D/C'd 2006. While on Betaseron, occassionally required solumedrol for flares.  . Pacemaker 2017  . Prostate enlargement   . Right bundle branch block   . SOB (shortness of breath)    a. PFTs 09/2010 essentially normal per pulm note (mild obst defect). b. 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  . Wenckebach    a. Decreased ex tolerance 2010/2012 - underwent stress echo to look @ effects of MV/LVOT - nothing noted; although pt had high rate Wenckebach behavior with associated  ex intol. Pacer reprogrammed with improvement in sx.    Past Surgical History:  Procedure Laterality Date  . LEFT HEART CATHETERIZATION WITH CORONARY ANGIOGRAM N/A 08/22/2013   Procedure: LEFT HEART CATHETERIZATION WITH CORONARY ANGIOGRAM;  Surgeon: [name], MD;  Location: [facility] CATH LAB;  Service: Cardiovascular;  Laterality: N/A;  . LEG TENDON SURGERY  2008   Os peroneus resection and peroneal tendon repair  . pace Agricultural engineer    . PACEMAKER INSERTION  12/2007   Complete heart block  . PACEMAKER LEAD REMOVAL Left 07/18/2015   Procedure: ATRIAL PACEMAKER LEAD EXTRACTION; ATRIAL PACEMAKER LEAD INSERTION; GENERATOR REPLACEMENT.;  Surgeon: [name], MD;  Location: [facility];  Service: Cardiovascular;  Laterality: Left;  [name] to back up  . PERIPHERAL VASCULAR CATHETERIZATION N/A 02/12/2015   Procedure: Venogram;  Surgeon: [name], MD;  Location: [facility] CV LAB;  Service: Cardiovascular;  Laterality: N/A;  . TEE WITHOUT CARDIOVERSION N/A 07/18/2015   Procedure: TRANSESOPHAGEAL ECHOCARDIOGRAM (TEE);  Surgeon: [name], MD;  Location: [facility] OR;  Service: Cardiovascular;  Laterality: N/A;    There were no vitals filed for this visit.   Subjective Assessment - 06/05/20 1019    Subjective  I had to cheat on a few coordination ex's    Patient is accompanied by: Family member   spouse   Pertinent History Fall Risk. Speech Deficits.  Limitations MS, CHB s/p PPM, HOCM    Patient Stated Goals right hand. 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 Return to driving recommendations.    Currently in Pain? No/denies          Reviewed coordination HEP and modifications needed for some. Pt demo each.  Pt practiced writing w/ various pens - pt did best w/ slightly larger pen. Pt encouraged to use Rt hand for all functional safe tasks including: eating, writing, grooming, bathing, wiping table/counter; and assisting in folding towels, making  sandwich, etc. Simulated eating tasks w/ dried beans - pt able to hold regular spoon and bring to mouth w/o spills Pt issued putty HEP and issued red resistance putty. Pt returned demo                      OT Education - 06/05/20 1046    Education Details Functional use of Rt hand, putty HEP    Person(s) Educated Patient    Methods Explanation;Demonstration;Handout    Comprehension Verbalized understanding;Returned demonstration            OT Short Term Goals - 06/05/20 1055      OT SHORT TERM GOAL #1   Title Pt will be independent with HEP 06/21/20    Time 4    Period Weeks    Status Achieved    Target Date 06/21/20      OT SHORT TERM GOAL #2   Title Pt will demonstrate improved fine motor coordination by demonstrating decrease in 9 hole peg test score by 6 seconds with RUE.    Baseline RUE 71.06    Time 4    Period Weeks    Status On-going      OT SHORT TERM GOAL #3   Title Pt will perform simple warm meal prep or home management task with supervision and good safety awareness.    Time 4    Period Weeks    Status New      OT SHORT TERM GOAL #4   Title Pt will write 2-3 sentences with 75% legibility and increased size of letters (mild micrographia).    Baseline severe micrographia    Time 4    Period Weeks    Status On-going      OT SHORT TERM GOAL #5   Title Pt will demonstrate increase in grip strength by 5 lbs in RUE for increased ease with ADLs and clothing management, etc.    Baseline RUE 45.4 (LUE 65.6)    Time 4    Period Weeks    Status On-going      OT SHORT TERM GOAL #6   Title Pt will report using RUE (dominant hand) for 25% or more of functional tasks.    Time 4    Period Weeks    Status On-going             OT Long Term Goals - 05/24/20 1651      OT LONG TERM GOAL #1   Title Pt will be independent with updated HEP 07/19/2020    Time 8    Period Weeks    Status New    Target Date 07/19/20      OT LONG TERM GOAL #2    Title Pt will demonstrate improved fine motor coordination by demonstrating decrease in 9 hole peg test score by 15 seconds with RUE.    Time 8    Period Weeks  Status New      OT LONG TERM GOAL #3   Title Pt will perform work simulated tasks (typing, using mouse, etc) with RUE as dominant hand with 90% accuracy.    Time 8    Period Weeks    Status New      OT LONG TERM GOAL #4   Title Pt will report completing at least 1 home management task per day with mod I in order to decrease caregiver burden.    Time 8    Period Weeks    Status New      OT LONG TERM GOAL #5   Title Pt will improve grip strength by at least 10 lbs in RUE for strengthening dominant hand for functional use and tasks.    Baseline RUE 45.4 (LUE 65.6)    Time 8    Period Weeks    Status New      OT LONG TERM GOAL #6   Title Pt will report using RUE (dominant hand) for at least 75% of functional tasks per day.    Time 8    Period Weeks    Status New      OT LONG TERM GOAL #7   Title Pt will perform physical and cognitive tasks simultaneously with 95% accuracy for returning to prior tasks.    Time 8    Period Weeks    Status New      OT LONG TERM GOAL #8   Title Pt will write 3-5 sentences with 90% legibility and appropriate sizing.    Time 8    Period Weeks    Status New                 Plan - 06/05/20 1056    Clinical Impression Statement Pt progressing with RUE function and coordination. Pt appears motivated to get better.    OT Occupational Profile and History Detailed Assessment- Review of Records and additional review of physical, cognitive, psychosocial history related to current functional performance    Occupational performance deficits (Please refer to evaluation for details): IADL's;ADL's;Social Participation;Leisure;Work;Rest and Sleep    Body Structure / Function / Physical Skills ADL;Dexterity;Decreased knowledge of use of DME;Strength;UE functional  use;Endurance;IADL;Coordination;FMC    Rehab Potential Good    Clinical Decision Making Limited treatment options, no task modification necessary    Comorbidities Affecting Occupational Performance: None    Modification or Assistance to Complete Evaluation  No modification of tasks or assist necessary to complete eval    OT Frequency 2x / week    OT Duration 8 weeks   or 16 visits (+ eval) over extended time   OT Treatment/Interventions Self-care/ADL training;DME and/or AE instruction;Gait Training;Therapeutic activities;Balance training;Cognitive remediation/compensation;Therapeutic exercise;Neuromuscular education;Passive range of motion;Visual/perceptual remediation/compensation;Patient/family education;Energy conservation    Plan continue coordination and functional use RUE, continue to practice writing, pt needs proximal strengthening/wt bearing for scapula stability    OT Home Exercise Plan coordination HEP    Consulted and Agree with Plan of Care Patient;Family member/caregiver   spouse   Family Member Consulted spouse           Patient will benefit from skilled therapeutic intervention in order to improve the following deficits and impairments:   Body Structure / Function / Physical Skills: ADL, Dexterity, Decreased knowledge of use of DME, Strength, UE functional use, Endurance, IADL, Coordination, [facility]       Visit Diagnosis: Other lack of coordination  Hemiplegia and hemiparesis following cerebral infarction affecting right dominant side (  [place])  Muscle weakness (generalized)    Problem List Patient Active Problem List   Diagnosis Date Noted  . Stroke ([place]) 05/16/2020  . Acute focal neurological deficit 05/15/2020  . BPH (benign prostatic hyperplasia) 02/02/2016  . Fractured atrial pacemaker lead wire 07/18/2015  . 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 Chest pain 08/02/2013  . SOB (shortness of breath) 08/02/2013  . PVC (premature ventricular contraction) 02/13/2013    . Chest pain on exertion 02/25/2012  . Pacemaker-dual-chamber Medtronic 04/29/2011  . Change in bowel habits 01/22/2011  . Weight loss 01/22/2011  . COUGH 09/01/2010  . Multiple sclerosis ([place])   . HOCM (hypertrophic obstructive cardiomyopathy) ([place])   . Right bundle branch block   . Complete heart block ([place]) 12/05/2007    Carey Bullocks, OTR/L 06/05/2020, 1:35 PM  [facility] [address] [place], [place], [address] Phone: [phone]   Fax:  [phone]  Name: ALBERT HERSCH MRN: [id number] Date of Birth: 14-Dec-1955

## 2020-06-07 ENCOUNTER — Encounter: Payer: Self-pay | Admitting: Physical Therapy

## 2020-06-07 ENCOUNTER — Other Ambulatory Visit: Payer: Self-pay

## 2020-06-07 ENCOUNTER — Ambulatory Visit: Payer: BC Managed Care – PPO | Admitting: Physical Therapy

## 2020-06-07 DIAGNOSIS — R278 Other lack of coordination: Secondary | ICD-10-CM

## 2020-06-07 DIAGNOSIS — R2689 Other abnormalities of gait and mobility: Secondary | ICD-10-CM | POA: Diagnosis not present

## 2020-06-07 DIAGNOSIS — I69351 Hemiplegia and hemiparesis following cerebral infarction affecting right dominant side: Secondary | ICD-10-CM

## 2020-06-07 DIAGNOSIS — M6281 Muscle weakness (generalized): Secondary | ICD-10-CM

## 2020-06-07 NOTE — Therapy (Signed)
[facility] [address] [place], [place], [address] Phone: [phone]   Fax:  [phone]  Physical Therapy Treatment  Patient Details  Name: Rodney Adkins MRN: [id number] Date of Birth: July 31, 1955 Referring Provider (PT): Otto Herb, MD   Encounter Date: 06/07/2020   PT End of Session - 06/07/20 1221    Visit Number 5    Number of Visits 17    Authorization Type BCBS PPO - FOTO patient    PT Start Time 1107   pt running late due to using restroom   PT Stop Time 1147    PT Time Calculation (min) 40 min    Equipment Utilized During Treatment Gait belt    Activity Tolerance Patient tolerated treatment well    Behavior During Therapy [facility] for tasks assessed/performed           Past Medical History:  Diagnosis Date  . Complete heart block [facility]) June 2009   a. Presented with hypotension/pulm edema 12/2007 when HOCM was also discovered. s/p dual chamber MDT pacemaker. Thallium stest 7/09 negative for infiltrative disease or ischemia.  Followed Dr [name]  Marland Kitchen Heart murmur   . HOCM (hypertrophic obstructive cardiomyopathy) ([place]) 2009   Discovered by ECHO when he developed CHB. Mgmt Dr Caryl Comes  and Dr [name] at [facility]. Has outflow obstruction on ECHO 2011  . Multiple sclerosis ([place]) 1998   Sxs started 1998 with L body numbness. MRI c/w MS. Started Avonex. Saw Dr [name] at [facility] and switched to Betaseron and was on for 5 yrs but developed depression and site rxns and D/C'd 2006. While on Betaseron, occassionally required solumedrol for flares.  . Pacemaker 2017  . Prostate enlargement   . Right bundle branch block   . SOB (shortness of breath)    a. PFTs 09/2010 essentially normal per pulm note (mild obst defect). b. 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  . Wenckebach    a. Decreased ex tolerance 2010/2012 - underwent stress echo to look @ effects of MV/LVOT - nothing noted; although pt had high rate Wenckebach  behavior with associated ex intol. Pacer reprogrammed with improvement in sx.    Past Surgical History:  Procedure Laterality Date  . LEFT HEART CATHETERIZATION WITH CORONARY ANGIOGRAM N/A 08/22/2013   Procedure: LEFT HEART CATHETERIZATION WITH CORONARY ANGIOGRAM;  Surgeon: [name], MD;  Location: [facility] CATH LAB;  Service: Cardiovascular;  Laterality: N/A;  . LEG TENDON SURGERY  2008   Os peroneus resection and peroneal tendon repair  . pace Agricultural engineer    . PACEMAKER INSERTION  12/2007   Complete heart block  . PACEMAKER LEAD REMOVAL Left 07/18/2015   Procedure: ATRIAL PACEMAKER LEAD EXTRACTION; ATRIAL PACEMAKER LEAD INSERTION; GENERATOR REPLACEMENT.;  Surgeon: [name], MD;  Location: [facility];  Service: Cardiovascular;  Laterality: Left;  [name] to back up  . PERIPHERAL VASCULAR CATHETERIZATION N/A 02/12/2015   Procedure: Venogram;  Surgeon: [name], MD;  Location: [facility] CV LAB;  Service: Cardiovascular;  Laterality: N/A;  . TEE WITHOUT CARDIOVERSION N/A 07/18/2015   Procedure: TRANSESOPHAGEAL ECHOCARDIOGRAM (TEE);  Surgeon: [name], MD;  Location: [facility] OR;  Service: Cardiovascular;  Laterality: N/A;    There were no vitals filed for this visit.   Subjective Assessment - 06/07/20 1109    Subjective Got his booster shot yesterday, R arm is feeling a little sore.    Patient is accompained by: Family member    Pertinent History MS, CHB  s/p PPM, HOCM    Limitations Walking;Writing;Standing;Lifting;House hold activities    Currently in Pain? Yes   "soreness from booster shot on R arm, just hurts a little bit to lift it"                            [facility] Adult PT Treatment/Exercise - 06/07/20 0001      Transfers   Sit to Stand 5: Supervision;4: Min guard    Stand to Sit 5: Supervision    Comments pt initially demonstrating BLE bracing and falling back posteriorly initially during sit <> stand, reviewed proper technique with pt (scooting legs back,  forward weight shift) with pt able to demo improved technique also cued on proper cane placement when standing, performed x10 reps        Ambulation/Gait   Ambulation/Gait Yes    Ambulation/Gait Assistance 5: Supervision;4: Min guard    Ambulation/Gait Assistance Details x1 lap with supervision with SPC with quad tip with pt having incr LUE arm swing during gait, performed an additional x2 laps with dynamic tasks - quick start/stops and performing head motions, no LOB noted but pt does slow down gait speed when performing. plus additional 4 x 30' over mats for a compliant surface with min guard, pt with decr RLE foot clearance and scuffing on surface   Ambulation Distance (Feet) 345 Feet    Assistive device Straight cane   with quad tip   Gait Pattern Step-through pattern;Decreased dorsiflexion - right;Decreased dorsiflexion - left;Decreased step length - right;Decreased step length - left;Decreased arm swing - right;Poor foot clearance - right;Decreased hip/knee flexion - right    Ambulation Surface Level;Indoor    Gait Comments discussed pt using SPC with quad tip for small distances in the house, using RW for all other gait and when feeling more fatigued               Balance Exercises - 06/07/20 0001      Balance Exercises: Standing   Standing Eyes Opened Narrow base of support (BOS)    Standing Eyes Opened Limitations on blue air ex with feet together: 2 x10 reps head turns, 2 x 10 reps head nods    Standing Eyes Closed Foam/compliant surface;Wide (BOA);Limitations    Standing Eyes Closed Limitations on blue air ex feet hip width distance: 3 x 30 seconds    SLS with Vectors Solid surface;Limitations    SLS with Vectors Limitations stepping over 2" beam with LLE and back to midline for SLS/weight shifting with RLE beginning with UE support and then none x10 reps, and then performing same activity with 4" beam x10 reps    Marching Solid surface;Upper extremity assist 1;Limitations     Marching Limitations at countertop down and back 4 reps, cues for incr hip/knee flexion. Then performed marching standing still alternating legs with use of boomwhacker as visual cue on how high to lift R leg 2 x 10 reps               PT Short Term Goals - 05/21/20 1755      PT SHORT TERM GOAL #1   Title Pt will be independent with initial HEP.    Baseline NA    Time 4    Period Weeks    Status New    Target Date 06/18/20      PT SHORT TERM GOAL #2   Title Pt will decrease 5xSTS to </= 15 seconds  for decreased fall risk.    Baseline 22 s without UE support from standard arm chair 05/21/20    Time 4    Period Weeks    Status New    Target Date 06/18/20      PT SHORT TERM GOAL #3   Title Pt will increase gait speed to >/= 0.75 m/s for increased access to community and decreased fall risk.    Baseline 0.65 m/s with RW 05/21/20    Time 4    Period Weeks    Status New    Target Date 06/18/20      PT SHORT TERM GOAL #4   Title Pt will increase Berg score by 4 points in order to decrease fall risk and increase static and dynamic balance by a clincal detectable change (4 points = CDC).    Baseline 45/56 ind mod fall risk 05/21/20    Time 4    Period Weeks    Status New    Target Date 06/18/20             PT Long Term Goals - 05/21/20 1759      PT LONG TERM GOAL #1   Title Pt will be independent with progressive HEP.    Baseline NA    Time 8    Period Weeks    Status New    Target Date 07/16/20      PT LONG TERM GOAL #2   Title Pt will decrease 5xSTS to </= 12 seconds to no longer be classified as fall risk and to be WNL for age and gender.    Baseline 22 s from standard arm chair without UE support    Time 8    Period Weeks    Status New    Target Date 07/16/20      PT LONG TERM GOAL #3   Title Pt will reach >50/56 on Berg balance score for improved static and dynamic balance and decreased risk for falls.    Baseline 45/56 (mod fall risk) 05/21/20    Time  8    Period Weeks    Status New    Target Date 07/16/20      PT LONG TERM GOAL #4   Title Pt will reach 1.0 m/s gait speed for community ambulation and decreased fall risk.    Baseline 0.65 m/s    Time 8    Period Weeks    Status New    Target Date 07/16/20      PT LONG TERM GOAL #5   Title Pt will ambulated 800+ feet on outdoor/unlevel surfaces with supervision and LRAD.    Baseline NT    Time 8    Period Weeks    Status New    Target Date 07/16/20      Additional Long Term Goals   Additional Long Term Goals Yes      PT LONG TERM GOAL #6   Title Pt will increase FOTO from 46 to >/= 70 for increased confidence in abilities.    Baseline 46 05/21/20    Time 8    Period Weeks    Status New    Target Date 07/16/20                 Plan - 06/07/20 1421    Clinical Impression Statement Continued gait training with SPC with quad tip - pt needing supervision but min guard when performing dynamic tasks such as head motions and stepping over uneven  mats. Remainder of session focused on balance strategies on compliant surfaces and SLS activities. Pt needing review on proper sit <> stand technique, initially bracing against mat table with decr forward weight shift. Pt improved with incr reps. Will continue to progress towards LTGs.    Personal Factors and Comorbidities Comorbidity 3+;Age    Comorbidities MS, CHB s/p PPM, HOCM    Examination-Activity Limitations Squat;Locomotion Level;Transfers;Stairs;Stand    Examination-Participation Restrictions Yard Work;Occupation;Driving;Community Activity    Stability/Clinical Decision Making Evolving/Moderate complexity    Rehab Potential Good    PT Frequency 2x / week    PT Duration 8 weeks    PT Treatment/Interventions Gait training;Stair training;Functional mobility training;Therapeutic activities;Therapeutic exercise;Balance training;Neuromuscular re-education;Patient/family education;Orthotic Fit/Training;DME Instruction;ADLs/Self  Care Home Management;Energy conservation;Manual techniques;Vestibular    PT Next Visit Plan add to HEP as appropriate for balance. gait training with SPC with quad tip. corner balance exercises. Continue RLE strengthening and higher-level dynamic balance training. Outdoor gait training if weather permits. SciFit/NuStep    Consulted and Agree with Plan of Care Patient;Family member/caregiver    Family Member Consulted Wife           Patient will benefit from skilled therapeutic intervention in order to improve the following deficits and impairments:  Abnormal gait, Decreased coordination, Difficulty walking, Decreased endurance, Decreased activity tolerance, Impaired perceived functional ability, Decreased balance, Decreased mobility, Decreased strength, Improper body mechanics  Visit Diagnosis: Other lack of coordination  Hemiplegia and hemiparesis following cerebral infarction affecting right dominant side ([facility])  Muscle weakness (generalized)  Other abnormalities of gait and mobility     Problem List Patient Active Problem List   Diagnosis Date Noted  . Stroke ([place]) 05/16/2020  . Acute focal neurological deficit 05/15/2020  . BPH (benign prostatic hyperplasia) 02/02/2016  . Fractured atrial pacemaker lead wire 07/18/2015  . 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 Chest pain 08/02/2013  . SOB (shortness of breath) 08/02/2013  . PVC (premature ventricular contraction) 02/13/2013  . Chest pain on exertion 02/25/2012  . Pacemaker-dual-chamber Medtronic 04/29/2011  . Change in bowel habits 01/22/2011  . Weight loss 01/22/2011  . COUGH 09/01/2010  . Multiple sclerosis ([place])   . HOCM (hypertrophic obstructive cardiomyopathy) ([place])   . Right bundle branch block   . Complete heart block ([place]) 12/05/2007    Arliss Journey, PT, DPT  06/07/2020, 2:29 PM  [facility] [address] [place], [place], [address] Phone:  [phone]   Fax:  [phone]  Name: Rodney Adkins MRN: [id number] Date of Birth: [birth year]

## 2020-06-12 ENCOUNTER — Encounter: Payer: Self-pay | Admitting: Physical Therapy

## 2020-06-12 ENCOUNTER — Ambulatory Visit: Payer: BC Managed Care – PPO | Admitting: Physical Therapy

## 2020-06-12 ENCOUNTER — Ambulatory Visit: Payer: BC Managed Care – PPO

## 2020-06-12 ENCOUNTER — Ambulatory Visit: Payer: BC Managed Care – PPO | Admitting: Occupational Therapy

## 2020-06-12 ENCOUNTER — Other Ambulatory Visit: Payer: Self-pay

## 2020-06-12 DIAGNOSIS — I69351 Hemiplegia and hemiparesis following cerebral infarction affecting right dominant side: Secondary | ICD-10-CM

## 2020-06-12 DIAGNOSIS — R2689 Other abnormalities of gait and mobility: Secondary | ICD-10-CM

## 2020-06-12 DIAGNOSIS — M6281 Muscle weakness (generalized): Secondary | ICD-10-CM

## 2020-06-12 DIAGNOSIS — R278 Other lack of coordination: Secondary | ICD-10-CM

## 2020-06-12 DIAGNOSIS — R471 Dysarthria and anarthria: Secondary | ICD-10-CM

## 2020-06-12 DIAGNOSIS — R490 Dysphonia: Secondary | ICD-10-CM

## 2020-06-12 DIAGNOSIS — R1310 Dysphagia, unspecified: Secondary | ICD-10-CM

## 2020-06-12 NOTE — Patient Instructions (Signed)
Scapular Retraction (Prone)    Lie with arms at sides. Pinch shoulder blades together and raise off floor towards back pockets Repeat _10___ times per set.  Do __2__ sessions per day.  Extension - Prone (Dumbbell)    Lie with right arm hanging off side of bed. Lift hand back and up. Repeat _10___ times per set. Do __2__ sets per day.  Do NOT use weight  OTHER: Prone on Elbows    Place elbows directly under shoulders. Lift chest up and hold 5 sec. Repeat 5 times. Do 2 sessions per day.    Cranial Flexion: Overhead Arm Extension - Supine (Medicine Diona Foley)    Lie with knees bent, arms beyond head, holding _1-2___ lb dumbbell (palms facing). Pull ball up to above face. Repeat _10___ times per set. Do __2__ sets per day.

## 2020-06-12 NOTE — Therapy (Signed)
[facility] [address] [place], [place], [address] Phone: [phone]   Fax:  [phone]  Occupational Therapy Treatment  Patient Details  Name: Rodney Adkins MRN: [id number] Date of Birth: 04-27-1956 Referring Provider (OT): Darliss Cheney, MD   Encounter Date: 06/12/2020   OT End of Session - 06/12/20 1029    Visit Number 3    Number of Visits 17    Date for OT Re-Evaluation 07/19/20    Authorization Type State BCBS    Authorization Time Period VL:MN    OT Start Time 0845    OT Stop Time 0930    OT Time Calculation (min) 45 min    Activity Tolerance Patient tolerated treatment well    Behavior During Therapy [facility] for tasks assessed/performed           Past Medical History:  Diagnosis Date  . Complete heart block [facility]) June 2009   a. Presented with hypotension/pulm edema 12/2007 when HOCM was also discovered. s/p dual chamber MDT pacemaker. Thallium stest 7/09 negative for infiltrative disease or ischemia.  Followed Dr [name]  Marland Kitchen Heart murmur   . HOCM (hypertrophic obstructive cardiomyopathy) ([place]) 2009   Discovered by ECHO when he developed CHB. Mgmt Dr Caryl Comes  and Dr [name] at [facility]. Has outflow obstruction on ECHO 2011  . Multiple sclerosis ([place]) 1998   Sxs started 1998 with L body numbness. MRI c/w MS. Started Avonex. Saw Dr [name] at [facility] and switched to Betaseron and was on for 5 yrs but developed depression and site rxns and D/C'd 2006. While on Betaseron, occassionally required solumedrol for flares.  . Pacemaker 2017  . Prostate enlargement   . Right bundle branch block   . SOB (shortness of breath)    a. PFTs 09/2010 essentially normal per pulm note (mild obst defect). b. 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  . Wenckebach    a. Decreased ex tolerance 2010/2012 - underwent stress echo to look @ effects of MV/LVOT - nothing noted; although pt had high rate Wenckebach behavior with associated  ex intol. Pacer reprogrammed with improvement in sx.    Past Surgical History:  Procedure Laterality Date  . LEFT HEART CATHETERIZATION WITH CORONARY ANGIOGRAM N/A 08/22/2013   Procedure: LEFT HEART CATHETERIZATION WITH CORONARY ANGIOGRAM;  Surgeon: [name], MD;  Location: [facility] CATH LAB;  Service: Cardiovascular;  Laterality: N/A;  . LEG TENDON SURGERY  2008   Os peroneus resection and peroneal tendon repair  . pace Agricultural engineer    . PACEMAKER INSERTION  12/2007   Complete heart block  . PACEMAKER LEAD REMOVAL Left 07/18/2015   Procedure: ATRIAL PACEMAKER LEAD EXTRACTION; ATRIAL PACEMAKER LEAD INSERTION; GENERATOR REPLACEMENT.;  Surgeon: [name], MD;  Location: [facility];  Service: Cardiovascular;  Laterality: Left;  [name] to back up  . PERIPHERAL VASCULAR CATHETERIZATION N/A 02/12/2015   Procedure: Venogram;  Surgeon: [name], MD;  Location: [facility] CV LAB;  Service: Cardiovascular;  Laterality: N/A;  . TEE WITHOUT CARDIOVERSION N/A 07/18/2015   Procedure: TRANSESOPHAGEAL ECHOCARDIOGRAM (TEE);  Surgeon: [name], MD;  Location: [facility] OR;  Service: Cardiovascular;  Laterality: N/A;    There were no vitals filed for this visit.   Subjective Assessment - 06/12/20 0854    Subjective  I had to cheat on a few coordination ex's    Patient is accompanied by: Family member   spouse   Pertinent History Fall Risk. Speech Deficits.  Limitations MS, CHB s/p PPM, HOCM    Patient Stated Goals right hand. 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 Return to driving recommendations.    Currently in Pain? No/denies            Prone: bilateral scapula retraction x 10 reps w/ cues to encourage scapula depression as well as retraction. Prone on elbows for chest lift to encourage scapula depression and protraction.  Prone with RUE over EOB for shoulder ex w/ cues to prevent sh hiking (upper trap activation). Quadraped: cat/cow stretch x 5,  followed by A/P wt shifts. Pt required modifications and cues to prevent scapula winging and compensation w/ anterior wt shifts and instructed not to go as far.  Supine: bilateral sh flexion holding 2 lb weight (palms facing), followed by small circumduction ex's at 90* flex RUE only.  Copying peg design using medium sized pegs w/ mod difficulty using Rt hand w/ compensations into sh abduction.                     OT Education - 06/12/20 [phone]    Education Details Posterior shoulder girdle and strengthening HEP for RUE    Person(s) Educated Patient    Methods Explanation;Demonstration;Handout;Verbal cues    Comprehension Verbalized understanding;Returned demonstration;Verbal cues required            OT Short Term Goals - 06/05/20 1055      OT SHORT TERM GOAL #1   Title Pt will be independent with HEP 06/21/20    Time 4    Period Weeks    Status Achieved    Target Date 06/21/20      OT SHORT TERM GOAL #2   Title Pt will demonstrate improved fine motor coordination by demonstrating decrease in 9 hole peg test score by 6 seconds with RUE.    Baseline RUE 71.06    Time 4    Period Weeks    Status On-going      OT SHORT TERM GOAL #3   Title Pt will perform simple warm meal prep or home management task with supervision and good safety awareness.    Time 4    Period Weeks    Status New      OT SHORT TERM GOAL #4   Title Pt will write 2-3 sentences with 75% legibility and increased size of letters (mild micrographia).    Baseline severe micrographia    Time 4    Period Weeks    Status On-going      OT SHORT TERM GOAL #5   Title Pt will demonstrate increase in grip strength by 5 lbs in RUE for increased ease with ADLs and clothing management, etc.    Baseline RUE 45.4 (LUE 65.6)    Time 4    Period Weeks    Status On-going      OT SHORT TERM GOAL #6   Title Pt will report using RUE (dominant hand) for 25% or more of functional tasks.    Time 4    Period  Weeks    Status On-going             OT Long Term Goals - 05/24/20 1651      OT LONG TERM GOAL #1   Title Pt will be independent with updated HEP 07/19/2020    Time 8    Period Weeks    Status New    Target Date 07/19/20  OT LONG TERM GOAL #2   Title Pt will demonstrate improved fine motor coordination by demonstrating decrease in 9 hole peg test score by 15 seconds with RUE.    Time 8    Period Weeks    Status New      OT LONG TERM GOAL #3   Title Pt will perform work simulated tasks (typing, using mouse, etc) with RUE as dominant hand with 90% accuracy.    Time 8    Period Weeks    Status New      OT LONG TERM GOAL #4   Title Pt will report completing at least 1 home management task per day with mod I in order to decrease caregiver burden.    Time 8    Period Weeks    Status New      OT LONG TERM GOAL #5   Title Pt will improve grip strength by at least 10 lbs in RUE for strengthening dominant hand for functional use and tasks.    Baseline RUE 45.4 (LUE 65.6)    Time 8    Period Weeks    Status New      OT LONG TERM GOAL #6   Title Pt will report using RUE (dominant hand) for at least 75% of functional tasks per day.    Time 8    Period Weeks    Status New      OT LONG TERM GOAL #7   Title Pt will perform physical and cognitive tasks simultaneously with 95% accuracy for returning to prior tasks.    Time 8    Period Weeks    Status New      OT LONG TERM GOAL #8   Title Pt will write 3-5 sentences with 90% legibility and appropriate sizing.    Time 8    Period Weeks    Status New                 Plan - 06/12/20 1029    Clinical Impression Statement Pt with mild winging Rt scapula and hypermobility    OT Occupational Profile and History Detailed Assessment- Review of Records and additional review of physical, cognitive, psychosocial history related to current functional performance    Occupational performance deficits (Please refer to  evaluation for details): IADL's;ADL's;Social Participation;Leisure;Work;Rest and Sleep    Body Structure / Function / Physical Skills ADL;Dexterity;Decreased knowledge of use of DME;Strength;UE functional use;Endurance;IADL;Coordination;FMC    Rehab Potential Good    Clinical Decision Making Limited treatment options, no task modification necessary    Comorbidities Affecting Occupational Performance: None    Modification or Assistance to Complete Evaluation  No modification of tasks or assist necessary to complete eval    OT Frequency 2x / week    OT Duration 8 weeks   or 16 visits (+ eval) over extended time   OT Treatment/Interventions Self-care/ADL training;DME and/or AE instruction;Gait Training;Therapeutic activities;Balance training;Cognitive remediation/compensation;Therapeutic exercise;Neuromuscular education;Passive range of motion;Visual/perceptual remediation/compensation;Patient/family education;Energy conservation    Plan review shoulder girdle HEP, qaudraped, continue to practice writing and functional use RUE    OT Home Exercise Plan coordination HEP    Consulted and Agree with Plan of Care Patient;Family member/caregiver   spouse   Family Member Consulted spouse           Patient will benefit from skilled therapeutic intervention in order to improve the following deficits and impairments:   Body Structure / Function / Physical Skills: ADL, Dexterity, Decreased knowledge of use of  DME, Strength, UE functional use, Endurance, IADL, Coordination, [facility]       Visit Diagnosis: Hemiplegia and hemiparesis following cerebral infarction affecting right dominant side (HCC)  Muscle weakness (generalized)  Other lack of coordination    Problem List Patient Active Problem List   Diagnosis Date Noted  . Stroke ([place]) 05/16/2020  . Acute focal neurological deficit 05/15/2020  . BPH (benign prostatic hyperplasia) 02/02/2016  . Fractured atrial pacemaker lead wire 07/18/2015  .  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 Chest pain 08/02/2013  . SOB (shortness of breath) 08/02/2013  . PVC (premature ventricular contraction) 02/13/2013  . Chest pain on exertion 02/25/2012  . Pacemaker-dual-chamber Medtronic 04/29/2011  . Change in bowel habits 01/22/2011  . Weight loss 01/22/2011  . COUGH 09/01/2010  . Multiple sclerosis ([place])   . HOCM (hypertrophic obstructive cardiomyopathy) ([place])   . Right bundle branch block   . Complete heart block ([place]) 12/05/2007    Carey Bullocks, OTR/L 06/12/2020, 10:31 AM  [facility] [address] [place], [place], [address] Phone: [phone]   Fax:  [phone]  Name: Rodney Adkins MRN: [id number] Date of Birth: August 24, 1955

## 2020-06-12 NOTE — Therapy (Signed)
[facility] [address] [place], [place], [address] Phone: [phone]   Fax:  [phone]  Speech Language Pathology Evaluation  Patient Details  Name: Rodney Adkins MRN: [id number] Date of Birth: [birth year] Referring Provider (SLP): Darliss Cheney,  MD   Encounter Date: 06/12/2020   End of Session - 06/12/20 1758    Visit Number 1    Number of Visits 17    Date for SLP Re-Evaluation 09/10/20   90 days   SLP Start Time 0933    SLP Stop Time  1015    SLP Time Calculation (min) 42 min    Activity Tolerance Patient tolerated treatment well           Past Medical History:  Diagnosis Date  . Complete heart block [facility]) June 2009   a. Presented with hypotension/pulm edema 12/2007 when HOCM was also discovered. s/p dual chamber MDT pacemaker. Thallium stest 7/09 negative for infiltrative disease or ischemia.  Followed Dr [name]  Marland Kitchen Heart murmur   . HOCM (hypertrophic obstructive cardiomyopathy) ([place]) 2009   Discovered by ECHO when he developed CHB. Mgmt Dr Caryl Comes  and Dr [name] at [facility]. Has outflow obstruction on ECHO 2011  . Multiple sclerosis ([place]) 1998   Sxs started 1998 with L body numbness. MRI c/w MS. Started Avonex. Saw Dr [name] at [facility] and switched to Betaseron and was on for 5 yrs but developed depression and site rxns and D/C'd 2006. While on Betaseron, occassionally required solumedrol for flares.  . Pacemaker 2017  . Prostate enlargement   . Right bundle branch block   . SOB (shortness of breath)    a. PFTs 09/2010 essentially normal per pulm note (mild obst defect). b. 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  . 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 Pacer reprogrammed with improvement in sx.    Past Surgical History:  Procedure Laterality Date   . LEFT HEART CATHETERIZATION WITH CORONARY ANGIOGRAM N/A 08/22/2013   Procedure: LEFT HEART CATHETERIZATION WITH CORONARY ANGIOGRAM;  Surgeon: [name], MD;  Location: [name] CATH LAB;  Service: Cardiovascular;  Laterality: N/A;  . LEG TENDON SURGERY  2008   Os peroneus resection and peroneal tendon repair  . pace Agricultural engineer    . PACEMAKER INSERTION  12/2007   Complete heart block  . PACEMAKER LEAD REMOVAL Left 07/18/2015   Procedure: ATRIAL PACEMAKER LEAD EXTRACTION; ATRIAL PACEMAKER LEAD INSERTION; GENERATOR REPLACEMENT.;  Surgeon: [name], MD;  Location: [facility];  Service: Cardiovascular;  Laterality: Left;  [name] to back up  . PERIPHERAL VASCULAR CATHETERIZATION N/A 02/12/2015   Procedure: Venogram;  Surgeon: [name], MD;  Location: [facility] CV LAB;  Service: Cardiovascular;  Laterality: N/A;  . TEE WITHOUT CARDIOVERSION N/A 07/18/2015   Procedure: TRANSESOPHAGEAL ECHOCARDIOGRAM (TEE);  Surgeon: [name], MD;  Location: [facility] OR;  Service: Cardiovascular;  Laterality: N/A;    There were no vitals filed for this visit.   Subjective Assessment - 06/12/20 0943    Subjective Pt arrives today with concern mainly about his voice.    Patient is accompained by: --   friend   Currently in Pain? 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."      Balance Screen   Has the patient fallen in the past 6 months Yes      Prior Functional Status   Cognitive/Linguistic Baseline Within functional limits      Cognition   Overall Cognitive Status Within Functional Limits for tasks assessed      Auditory Comprehension   Overall Auditory Comprehension  Appears within functional limits for tasks assessed      Verbal Expression   Overall Verbal Expression Appears within functional limits for tasks assessed      Written Expression   Dominant Hand Right      Oral Motor/Sensory Function   Overall Oral Motor/Sensory Function Impaired    Labial ROM Within Functional Limits    Labial Symmetry Abnormal symmetry right    Labial Strength Reduced Right    Labial Sensation Within Functional Limits    Labial Coordination Reduced    Lingual ROM Within Functional Limits   with cues   Lingual Symmetry Abnormal symmetry right    Lingual Strength Reduced Right    Lingual Sensation Within Functional Limits    Lingual Coordination Reduced    Facial Symmetry Right droop      Motor Speech   Overall Motor Speech Impaired    Phonation Hoarse   pt straining-SLP told pt to "use normal voice effort" (to limit possibility of MTD); pt sust /a/18.4 (low-WNL), vocal frequency today is approximately 240Hz  (higher than normal for pt's gender)   Articulation Impaired    Level of Impairment Phrase    Intelligibility Intelligible   can decr to ~90% when demonstrates faster rate   Motor Planning Witnin functional limits    Effective Techniques Slow rate    Phonation --    Volume Appropriate    Pitch High   approx 250Hz          Pt reports to SLP that he cont to have to "take my time" when drinking liquids, and that the difficulty has improved, but not notably improved, since d/c from hospital. A modified barium swallow exam is warranted at this time. Co-sign from MD will be indication that MD approves of this and SLP will schedule pt for this exam.                  SLP Education - 06/12/20 1146    Education Details talk without straining/pushing (danger of MTD), modified barium swallow recommendation, nature of likely vocal fold involvement post-CVA, may need ENT referral during therapy course, PHortE (sustained /a/, and more exercises to come),  effortful swallow    Person(s) Educated Patient    Methods Explanation    Comprehension Verbalized understanding            SLP Short Term Goals - 06/12/20 1806      SLP SHORT TERM GOAL #1   Title pt will undergo objective swallow assessment    Time 2    Period Weeks    Status New      SLP SHORT TERM GOAL #2   Title pt will demonstrate dysarthria HEP with rare min A over 3 sessions    Time  4    Period Weeks   or 9 sessions, for all STGs   Status New      SLP SHORT TERM GOAL #3   Title pt will demonstrage voice HEP with rare min A over 3 sessions    Time 4    Period Weeks    Status New      SLP SHORT TERM GOAL #4   Title pt will engage in 8 mintues conversation maintaining gentle voice in 3 sesions, to limit possibility of habitualizing muscle tension dysphonia    Time 4    Period Weeks    Status New      SLP SHORT TERM GOAL #5   Title pt will demonstrate speech compensations in sentence responses 80% of the time, in 3 sesssions    Time 4    Period Weeks    Status New            SLP Long Term Goals - 06/12/20 1810      SLP LONG TERM GOAL #1   Title pt will demonstrate voice HEP with modified indpendnece in 3 sessions    Time 8    Period Weeks   or 17 sessions, for all LTGs   Status New      SLP LONG TERM GOAL #2   Title pt will engage in 12 mintues conversation maintaining gentle voice 80% of the time, to limit possibility of habitualizing muscle tension dysphonia    Time 8    Period Weeks    Status New      SLP LONG TERM GOAL #3   Title pt will engage in 12 mintues mod complex conversation using speech strategies 80% of the time, in 3 sessions    Time 8    Period Weeks    Status New      SLP LONG TERM GOAL #4   Title pt will demonstrate improved score on Voice-related quality of life (QOL) measure (indicating better QOL) from therapy session #1    Time 8    Period Weeks    Status New            Plan - 06/12/20 1759    Clinical Impression  Statement Kamaree "Marden Noble" presents today with mild flaccid dysarthria, and 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 Today he was provided with an exericse that is part of the [facility] program, as well as a general swallowing exercise (effortful swallow). A modified barium swallow exam is recommended as pt's swallow ability has not improved dramatically over the last 4 weeks. Skilled ST is necessary to improve pt's swallowing, speech, adn voice quality. If pt's voice does not show improvement a referral to ENT may be necessary.    Speech Therapy Frequency 2x / week    Duration 8 weeks   17 total sessions   Treatment/Interventions Aspiration precaution training;Pharyngeal strengthening exercises;Diet toleration management by SLP;Trials of upgraded texture/liquids;Cueing hierarchy;SLP instruction and feedback;Functional tasks;Oral motor exercises;Compensatory strategies;Patient/family education;Internal/external aids;Other (comment)   voice exercises   Potential to Achieve Goals Good    SLP Home Exercise Plan provided today    Consulted and Agree with Plan of Care Patient           Patient will benefit from skilled therapeutic intervention in order to improve the following deficits and impairments:   Voice hoarseness  Dysarthria and anarthria  Dysphagia, unspecified type    Problem List Patient Active Problem List   Diagnosis Date Noted  .  Stroke ([place]) 05/16/2020  . Acute focal neurological deficit 05/15/2020  . BPH (benign prostatic hyperplasia) 02/02/2016  . Fractured atrial pacemaker lead wire 07/18/2015  . 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 Chest pain 08/02/2013  . SOB (shortness of breath) 08/02/2013  . PVC (premature ventricular contraction) 02/13/2013  . Chest pain on exertion 02/25/2012  . Pacemaker-dual-chamber Medtronic 04/29/2011  . Change in bowel habits 01/22/2011  . Weight loss 01/22/2011  . COUGH 09/01/2010  . Multiple  sclerosis ([place])   . HOCM (hypertrophic obstructive cardiomyopathy) ([place])   . Right bundle branch block   . Complete heart block ([place]) 12/05/2007    [place] ,[place], [place]  06/12/2020, 6:17 PM  [facility] [address] [place], [place], [address] Phone: [phone]   Fax:  [phone]  Name: ORVILLE MENA MRN: [id number] Date of Birth: 14-May-1956

## 2020-06-12 NOTE — Patient Instructions (Signed)
   Say "ah" with a strong voice (not shouting or pushing) 10 times at least twice a day, up to four times a day.  Whenever you swallow - swallow hard!  I will likely recommend a modified barium swallow test for you - be expecting a call from the hospital to set this up.

## 2020-06-12 NOTE — Patient Instructions (Signed)
Access Code: [email or web address] URL: [email or web address] Date: 06/12/2020 Prepared by: Janann August  Exercises Side Stepping with Counter Support - 2 x daily - 7 x weekly - 1 sets - 4 reps Mini Squat with Counter Support - 2 x daily - 5 x weekly - 2 sets - 10 reps Standing Marching - 2 x daily - 5 x weekly - 2 sets - 10 reps Tandem Stance - 2 x daily - 5 x weekly - 1 sets - 3 reps - 20 sec hold Romberg Stance with Head Nods - 1 x daily - 5 x weekly - 2 sets - 10 reps Wide Stance with Eyes Closed on Foam Pad - 1 x daily - 5 x weekly - 3 sets - 30 hold

## 2020-06-12 NOTE — Therapy (Signed)
[facility] [address] [place], [place], [address] Phone: [phone]   Fax:  [phone]  Physical Therapy Treatment  Patient Details  Name: Rodney Adkins MRN: [id number] Date of Birth: 18-Oct-1955 Referring Provider (PT): Otto Herb, MD   Encounter Date: 06/12/2020   PT End of Session - 06/12/20 1207    Visit Number 6    Number of Visits 17    Authorization Type BCBS PPO - FOTO patient    PT Start Time 6195   pt running late from [place] and needing to use restroom   PT Stop Time 1102    PT Time Calculation (min) 34 min    Equipment Utilized During Treatment Gait belt    Activity Tolerance Patient tolerated treatment well    Behavior During Therapy [facility] for tasks assessed/performed           Past Medical History:  Diagnosis Date  . Complete heart block [facility]) June 2009   a. Presented with hypotension/pulm edema 12/2007 when HOCM was also discovered. s/p dual chamber MDT pacemaker. Thallium stest 7/09 negative for infiltrative disease or ischemia.  Followed Dr [name]  Marland Kitchen Heart murmur   . HOCM (hypertrophic obstructive cardiomyopathy) ([place]) 2009   Discovered by ECHO when he developed CHB. Mgmt Dr Caryl Comes  and Dr [name] at [facility]. Has outflow obstruction on ECHO 2011  . Multiple sclerosis ([place]) 1998   Sxs started 1998 with L body numbness. MRI c/w MS. Started Avonex. Saw Dr [name] at [facility] and switched to Betaseron and was on for 5 yrs but developed depression and site rxns and D/C'd 2006. While on Betaseron, occassionally required solumedrol for flares.  . Pacemaker 2017  . Prostate enlargement   . Right bundle branch block   . SOB (shortness of breath)    a. PFTs 09/2010 essentially normal per pulm note (mild obst defect). b. 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  . Wenckebach    a. Decreased ex tolerance 2010/2012 - underwent stress echo to look @ effects of MV/LVOT - nothing noted; although pt had high  rate Wenckebach behavior with associated ex intol. Pacer reprogrammed with improvement in sx.    Past Surgical History:  Procedure Laterality Date  . LEFT HEART CATHETERIZATION WITH CORONARY ANGIOGRAM N/A 08/22/2013   Procedure: LEFT HEART CATHETERIZATION WITH CORONARY ANGIOGRAM;  Surgeon: [name], MD;  Location: [facility] CATH LAB;  Service: Cardiovascular;  Laterality: N/A;  . LEG TENDON SURGERY  2008   Os peroneus resection and peroneal tendon repair  . pace Agricultural engineer    . PACEMAKER INSERTION  12/2007   Complete heart block  . PACEMAKER LEAD REMOVAL Left 07/18/2015   Procedure: ATRIAL PACEMAKER LEAD EXTRACTION; ATRIAL PACEMAKER LEAD INSERTION; GENERATOR REPLACEMENT.;  Surgeon: [name], MD;  Location: [facility];  Service: Cardiovascular;  Laterality: Left;  [name] to back up  . PERIPHERAL VASCULAR CATHETERIZATION N/A 02/12/2015   Procedure: Venogram;  Surgeon: [name], MD;  Location: [place] CV LAB;  Service: Cardiovascular;  Laterality: N/A;  . TEE WITHOUT CARDIOVERSION N/A 07/18/2015   Procedure: TRANSESOPHAGEAL ECHOCARDIOGRAM (TEE);  Surgeon: [name], MD;  Location: [facility] OR;  Service: Cardiovascular;  Laterality: N/A;    There were no vitals filed for this visit.   Subjective Assessment - 06/12/20 1031    Subjective No changes, no falls. Exercises at home have been going well.    Patient is accompained by: Family member    Pertinent History  MS, CHB s/p PPM, HOCM    Limitations Walking;Writing;Standing;Lifting;House hold activities    Currently in Pain? No/denies                             [facility] Adult PT Treatment/Exercise - 06/12/20 0001      Transfers   Sit to Stand 5: Supervision;4: Min guard    Sit to Stand Details Visual cues/gestures for precautions/safety;Visual cues/gestures for sequencing;Verbal cues for precautions/safety;Verbal cues for sequencing;Verbal cues for technique    Sit to Stand Details (indicate cue type and reason) one  episode of min A for balance as pt initially performing without proper technique and with BLE bracing and beginning to lose balance posteriorly against mat    Stand to Sit 5: Supervision    Number of Reps 2 sets;Other reps (comment)   5 reps   Comments cues for bringing feet back under him, forward weight shift to decr BLE bracing against mat, pt able to demo proper technique throughout remainder of session      Ambulation/Gait   Ambulation/Gait Yes    Ambulation/Gait Assistance 5: Supervision;4: Min guard    Ambulation/Gait Assistance Details cues for R heel strike and incr foot clearance over level surfaces, min guard for balance when pt ambulating over 2 compliant mats    Ambulation Distance (Feet) 115 Feet    Assistive device Straight cane   with quad tip   Gait Pattern Step-through pattern;Decreased dorsiflexion - right;Decreased dorsiflexion - left;Decreased step length - right;Decreased step length - left;Decreased arm swing - right;Poor foot clearance - right;Decreased hip/knee flexion - right    Ambulation Surface Level;Indoor               Balance Exercises - 06/12/20 0001      Balance Exercises: Standing   SLS Eyes open;Solid surface;Limitations    SLS Limitations alternating step taps to 6" step x10 reps B with no UE support, and then to 2nd step  x10 reps B with single UE support cues for incr R foot clearance    SLS with Vectors Solid surface;Intermittent upper extremity assist;Upper extremity assist 1    SLS with Vectors Limitations with cones, standing on LLE and tapping R foot to cone, floor bubble and then 2nd cone for incr foot clearance/hip flexion x8 reps, then standing on RLE and tapping LLE to single cone x12 reps with fingertip support and min guard for balance    Step Ups Forward;Limitations;UE support 1    Step Ups Limitations with RLE, cues for incr foot clearance when stepping up and stepping down x10 reps           Access Code: [email or web address] URL:  [email or web address] Date: 06/12/2020 Prepared by: Janann August  Bolded = new additions to HEP. See MedBridge for more details.   Exercises Side Stepping with Counter Support - 2 x daily - 7 x weekly - 1 sets - 4 reps Mini Squat with Counter Support - 2 x daily - 5 x weekly - 2 sets - 10 reps Standing Marching - 2 x daily - 5 x weekly - 2 sets - 10 reps Tandem Stance - 2 x daily - 5 x weekly - 1 sets - 3 reps - 20 sec hold  Romberg Stance with Head Nods - 1 x daily - 5 x weekly - 2 sets - 10 reps on level ground, and head turns x10 reps  Wide Stance with Eyes Closed on Foam Pad -  1 x daily - 5 x weekly - 3 sets - 30 hold    PT Education - 06/12/20 1207    Education Details corner balance additions to HEP.    Person(s) Educated Patient   friend   Methods Explanation;Demonstration;Handout    Comprehension Verbalized understanding;Returned demonstration            PT Short Term Goals - 05/21/20 1755      PT SHORT TERM GOAL #1   Title Pt will be independent with initial HEP.    Baseline NA    Time 4    Period Weeks    Status New    Target Date 06/18/20      PT SHORT TERM GOAL #2   Title Pt will decrease 5xSTS to </= 15 seconds for decreased fall risk.    Baseline 22 s without UE support from standard arm chair 05/21/20    Time 4    Period Weeks    Status New    Target Date 06/18/20      PT SHORT TERM GOAL #3   Title Pt will increase gait speed to >/= 0.75 m/s for increased access to community and decreased fall risk.    Baseline 0.65 m/s with RW 05/21/20    Time 4    Period Weeks    Status New    Target Date 06/18/20      PT SHORT TERM GOAL #4   Title Pt will increase Berg score by 4 points in order to decrease fall risk and increase static and dynamic balance by a clincal detectable change (4 points = CDC).    Baseline 45/56 ind mod fall risk 05/21/20    Time 4    Period Weeks    Status New    Target Date 06/18/20             PT Long Term  Goals - 05/21/20 1759      PT LONG TERM GOAL #1   Title Pt will be independent with progressive HEP.    Baseline NA    Time 8    Period Weeks    Status New    Target Date 07/16/20      PT LONG TERM GOAL #2   Title Pt will decrease 5xSTS to </= 12 seconds to no longer be classified as fall risk and to be WNL for age and gender.    Baseline 22 s from standard arm chair without UE support    Time 8    Period Weeks    Status New    Target Date 07/16/20      PT LONG TERM GOAL #3   Title Pt will reach >50/56 on Berg balance score for improved static and dynamic balance and decreased risk for falls.    Baseline 45/56 (mod fall risk) 05/21/20    Time 8    Period Weeks    Status New    Target Date 07/16/20      PT LONG TERM GOAL #4   Title Pt will reach 1.0 m/s gait speed for community ambulation and decreased fall risk.    Baseline 0.65 m/s    Time 8    Period Weeks    Status New    Target Date 07/16/20      PT LONG TERM GOAL #5   Title Pt will ambulated 800+ feet on outdoor/unlevel surfaces with supervision and LRAD.    Baseline NT    Time 8  Period Weeks    Status New    Target Date 07/16/20      Additional Long Term Goals   Additional Long Term Goals Yes      PT LONG TERM GOAL #6   Title Pt will increase FOTO from 46 to >/= 70 for increased confidence in abilities.    Baseline 46 05/21/20    Time 8    Period Weeks    Status New    Target Date 07/16/20                 Plan - 06/12/20 1208    Clinical Impression Statement Added corner balance exercises to pt's HEP working on narrow BOS, head motions, and balance with vision removed. Remainder of session focused on gait training with SPC with quad tip and activities for RLE SLS and incr foot clerance/hip flexion with RLE. Pt tolerated session well, needed initial cues for proper sit <> stand technique and pt initially performing with BLE bracing against mat and needing min A. Will continue to progress towards  LTGs.    Personal Factors and Comorbidities Comorbidity 3+;Age    Comorbidities MS, CHB s/p PPM, HOCM    Examination-Activity Limitations Squat;Locomotion Level;Transfers;Stairs;Stand    Examination-Participation Restrictions Yard Work;Occupation;Driving;Community Activity    Stability/Clinical Decision Making Evolving/Moderate complexity    Rehab Potential Good    PT Frequency 2x / week    PT Duration 8 weeks    PT Treatment/Interventions Gait training;Stair training;Functional mobility training;Therapeutic activities;Therapeutic exercise;Balance training;Neuromuscular re-education;Patient/family education;Orthotic Fit/Training;DME Instruction;ADLs/Self Care Home Management;Energy conservation;Manual techniques;Vestibular    PT Next Visit Plan how are new additions to HEP? gait training with SPC with quad tip (scanning, obstacles, over unlevel surfaces). sit <> stands.  corner balance exercises. Continue RLE strengthening and higher-level dynamic balance training. Outdoor gait training if weather permits. SciFit/NuStep    Consulted and Agree with Plan of Care Patient;Family member/caregiver    Family Member Consulted Wife           Patient will benefit from skilled therapeutic intervention in order to improve the following deficits and impairments:  Abnormal gait, Decreased coordination, Difficulty walking, Decreased endurance, Decreased activity tolerance, Impaired perceived functional ability, Decreased balance, Decreased mobility, Decreased strength, Improper body mechanics  Visit Diagnosis: Hemiplegia and hemiparesis following cerebral infarction affecting right dominant side (HCC)  Other lack of coordination  Muscle weakness (generalized)  Other abnormalities of gait and mobility     Problem List Patient Active Problem List   Diagnosis Date Noted  . Stroke ([place]) 05/16/2020  . Acute focal neurological deficit 05/15/2020  . BPH (benign prostatic hyperplasia) 02/02/2016  .  Fractured atrial pacemaker lead wire 07/18/2015  . 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 Chest pain 08/02/2013  . SOB (shortness of breath) 08/02/2013  . PVC (premature ventricular contraction) 02/13/2013  . Chest pain on exertion 02/25/2012  . Pacemaker-dual-chamber Medtronic 04/29/2011  . Change in bowel habits 01/22/2011  . Weight loss 01/22/2011  . COUGH 09/01/2010  . Multiple sclerosis ([place])   . HOCM (hypertrophic obstructive cardiomyopathy) ([place])   . Right bundle branch block   . Complete heart block ([place]) 12/05/2007    Arliss Journey , PT, DPT  06/12/2020, 12:14 PM  [facility] [address] [place], [place], [address] Phone: [phone]   Fax:  [phone]  Name: Rodney Adkins MRN: [id number] Date of Birth: May 07, 1956

## 2020-06-13 ENCOUNTER — Ambulatory Visit (INDEPENDENT_AMBULATORY_CARE_PROVIDER_SITE_OTHER): Payer: BC Managed Care – PPO

## 2020-06-13 DIAGNOSIS — I442 Atrioventricular block, complete: Secondary | ICD-10-CM

## 2020-06-14 ENCOUNTER — Ambulatory Visit: Payer: BC Managed Care – PPO

## 2020-06-14 ENCOUNTER — Ambulatory Visit: Payer: BC Managed Care – PPO | Admitting: Occupational Therapy

## 2020-06-14 ENCOUNTER — Other Ambulatory Visit: Payer: Self-pay

## 2020-06-14 ENCOUNTER — Encounter: Payer: Self-pay | Admitting: Occupational Therapy

## 2020-06-14 DIAGNOSIS — M6281 Muscle weakness (generalized): Secondary | ICD-10-CM

## 2020-06-14 DIAGNOSIS — R2689 Other abnormalities of gait and mobility: Secondary | ICD-10-CM

## 2020-06-14 DIAGNOSIS — R41840 Attention and concentration deficit: Secondary | ICD-10-CM

## 2020-06-14 DIAGNOSIS — R278 Other lack of coordination: Secondary | ICD-10-CM

## 2020-06-14 DIAGNOSIS — I69351 Hemiplegia and hemiparesis following cerebral infarction affecting right dominant side: Secondary | ICD-10-CM

## 2020-06-14 NOTE — Therapy (Signed)
[facility] [address] [place], [place], [address] Phone: [phone]   Fax:  [phone]  Occupational Therapy Treatment  Patient Details  Name: Rodney Adkins MRN: [id number] Date of Birth: 08-10-1955 Referring Provider (OT): Darliss Cheney, MD   Encounter Date: 06/14/2020   OT End of Session - 06/14/20 1320    Visit Number 4    Number of Visits 17    Date for OT Re-Evaluation 07/19/20    Authorization Type State BCBS    Authorization Time Period VL:MN    OT Start Time 1320    OT Stop Time 1400    OT Time Calculation (min) 40 min    Activity Tolerance Patient tolerated treatment well    Behavior During Therapy [facility] for tasks assessed/performed           Past Medical History:  Diagnosis Date  . Complete heart block [facility]) June 2009   a. Presented with hypotension/pulm edema 12/2007 when HOCM was also discovered. s/p dual chamber MDT pacemaker. Thallium stest 7/09 negative for infiltrative disease or ischemia.  Followed Dr [name]  Marland Kitchen Heart murmur   . HOCM (hypertrophic obstructive cardiomyopathy) ([place]) 2009   Discovered by ECHO when he developed CHB. Mgmt Dr Caryl Comes  and Dr [name] at [facility]. Has outflow obstruction on ECHO 2011  . Multiple sclerosis ([place]) 1998   Sxs started 1998 with L body numbness. MRI c/w MS. Started Avonex. Saw Dr [name] at [facility] and switched to Betaseron and was on for 5 yrs but developed depression and site rxns and D/C'd 2006. While on Betaseron, occassionally required solumedrol for flares.  . Pacemaker 2017  . Prostate enlargement   . Right bundle branch block   . SOB (shortness of breath)    a. PFTs 09/2010 essentially normal per pulm note (mild obst defect). b. 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  . Wenckebach    a. Decreased ex tolerance 2010/2012 - underwent stress echo to look @ effects of MV/LVOT - nothing noted; although pt had high rate Wenckebach behavior with associated  ex intol. Pacer reprogrammed with improvement in sx.    Past Surgical History:  Procedure Laterality Date  . LEFT HEART CATHETERIZATION WITH CORONARY ANGIOGRAM N/A 08/22/2013   Procedure: LEFT HEART CATHETERIZATION WITH CORONARY ANGIOGRAM;  Surgeon: [name], MD;  Location: [facility] CATH LAB;  Service: Cardiovascular;  Laterality: N/A;  . LEG TENDON SURGERY  2008   Os peroneus resection and peroneal tendon repair  . pace Agricultural engineer    . PACEMAKER INSERTION  12/2007   Complete heart block  . PACEMAKER LEAD REMOVAL Left 07/18/2015   Procedure: ATRIAL PACEMAKER LEAD EXTRACTION; ATRIAL PACEMAKER LEAD INSERTION; GENERATOR REPLACEMENT.;  Surgeon: [name], MD;  Location: [place];  Service: Cardiovascular;  Laterality: Left;  [name] to back up  . PERIPHERAL VASCULAR CATHETERIZATION N/A 02/12/2015   Procedure: Venogram;  Surgeon: [name], MD;  Location: [place] CV LAB;  Service: Cardiovascular;  Laterality: N/A;  . TEE WITHOUT CARDIOVERSION N/A 07/18/2015   Procedure: TRANSESOPHAGEAL ECHOCARDIOGRAM (TEE);  Surgeon: [name], MD;  Location: [facility] OR;  Service: Cardiovascular;  Laterality: N/A;    There were no vitals filed for this visit.   Subjective Assessment - 06/14/20 1321    Subjective  "I am still having a hard time with the coordination"    Patient is accompanied by: Family member   spouse   Pertinent History Fall Risk. Speech Deficits.  Limitations MS, CHB s/p PPM, HOCM    Patient Stated Goals right hand. 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 Return to driving recommendations.    Currently in Pain? No/denies                        OT Treatments/Exercises (OP) - 06/14/20 1324      Exercises   Exercises Hand      Hand Exercises   Hand Gripper with Medium Beads pick up 1 inch blocks with hand gripper on level 1 with RUE      Fine Motor Coordination (Hand/Wrist)   Fine Motor Coordination Handwriting     Handwriting tracing shapes and letters (ABC) with RUE with highlighter. Diffculty with smooth strokes and with deviation ~1/4" from line. Pt with micrographia with loops across the page - improvement noted with taking time and emphasis on thinking big with the loops. Pt successful with coban wrapped grip                    OT Short Term Goals - 06/05/20 1055      OT SHORT TERM GOAL #1   Title Pt will be independent with HEP 06/21/20    Time 4    Period Weeks    Status Achieved    Target Date 06/21/20      OT SHORT TERM GOAL #2   Title Pt will demonstrate improved fine motor coordination by demonstrating decrease in 9 hole peg test score by 6 seconds with RUE.    Baseline RUE 71.06    Time 4    Period Weeks    Status On-going      OT SHORT TERM GOAL #3   Title Pt will perform simple warm meal prep or home management task with supervision and good safety awareness.    Time 4    Period Weeks    Status New      OT SHORT TERM GOAL #4   Title Pt will write 2-3 sentences with 75% legibility and increased size of letters (mild micrographia).    Baseline severe micrographia    Time 4    Period Weeks    Status On-going      OT SHORT TERM GOAL #5   Title Pt will demonstrate increase in grip strength by 5 lbs in RUE for increased ease with ADLs and clothing management, etc.    Baseline RUE 45.4 (LUE 65.6)    Time 4    Period Weeks    Status On-going      OT SHORT TERM GOAL #6   Title Pt will report using RUE (dominant hand) for 25% or more of functional tasks.    Time 4    Period Weeks    Status On-going             OT Long Term Goals - 05/24/20 1651      OT LONG TERM GOAL #1   Title Pt will be independent with updated HEP 07/19/2020    Time 8    Period Weeks    Status New    Target Date 07/19/20      OT LONG TERM GOAL #2   Title Pt will demonstrate improved fine motor coordination by demonstrating decrease in 9 hole peg test score by 15 seconds with RUE.     Time 8    Period Weeks    Status New  OT LONG TERM GOAL #3   Title Pt will perform work simulated tasks (typing, using mouse, etc) with RUE as dominant hand with 90% accuracy.    Time 8    Period Weeks    Status New      OT LONG TERM GOAL #4   Title Pt will report completing at least 1 home management task per day with mod I in order to decrease caregiver burden.    Time 8    Period Weeks    Status New      OT LONG TERM GOAL #5   Title Pt will improve grip strength by at least 10 lbs in RUE for strengthening dominant hand for functional use and tasks.    Baseline RUE 45.4 (LUE 65.6)    Time 8    Period Weeks    Status New      OT LONG TERM GOAL #6   Title Pt will report using RUE (dominant hand) for at least 75% of functional tasks per day.    Time 8    Period Weeks    Status New      OT LONG TERM GOAL #7   Title Pt will perform physical and cognitive tasks simultaneously with 95% accuracy for returning to prior tasks.    Time 8    Period Weeks    Status New      OT LONG TERM GOAL #8   Title Pt will write 3-5 sentences with 90% legibility and appropriate sizing.    Time 8    Period Weeks    Status New                 Plan - 06/14/20 1326    Clinical Impression Statement Pt with deficits with RUE fine motor coordination    OT Occupational Profile and History Detailed Assessment- Review of Records and additional review of physical, cognitive, psychosocial history related to current functional performance    Occupational performance deficits (Please refer to evaluation for details): IADL's;ADL's;Social Participation;Leisure;Work;Rest and Sleep    Body Structure / Function / Physical Skills ADL;Dexterity;Decreased knowledge of use of DME;Strength;UE functional use;Endurance;IADL;Coordination;FMC    Rehab Potential Good    Clinical Decision Making Limited treatment options, no task modification necessary    Comorbidities Affecting Occupational Performance: None     Modification or Assistance to Complete Evaluation  No modification of tasks or assist necessary to complete eval    OT Frequency 2x / week    OT Duration 8 weeks   or 16 visits (+ eval) over extended time   OT Treatment/Interventions Self-care/ADL training;DME and/or AE instruction;Gait Training;Therapeutic activities;Balance training;Cognitive remediation/compensation;Therapeutic exercise;Neuromuscular education;Passive range of motion;Visual/perceptual remediation/compensation;Patient/family education;Energy conservation    Plan review shoulder girdle HEP, qaudraped, continue to practice writing and functional use RUE    OT Home Exercise Plan coordination HEP    Consulted and Agree with Plan of Care Patient;Family member/caregiver   spouse   Family Member Consulted spouse           Patient will benefit from skilled therapeutic intervention in order to improve the following deficits and impairments:   Body Structure / Function / Physical Skills: ADL,Dexterity,Decreased knowledge of use of DME,Strength,UE functional use,Endurance,IADL,Coordination,FMC       Visit Diagnosis: Hemiplegia and hemiparesis following cerebral infarction affecting right dominant side (HCC)  Muscle weakness (generalized)  Other lack of coordination  Attention and concentration deficit    Problem List Patient Active Problem List   Diagnosis Date Noted  .  Stroke ([place]) 05/16/2020  . Acute focal neurological deficit 05/15/2020  . BPH (benign prostatic hyperplasia) 02/02/2016  . Fractured atrial pacemaker lead wire 07/18/2015  . 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 Chest pain 08/02/2013  . SOB (shortness of breath) 08/02/2013  . PVC (premature ventricular contraction) 02/13/2013  . Chest pain on exertion 02/25/2012  . Pacemaker-dual-chamber Medtronic 04/29/2011  . Change in bowel habits 01/22/2011  . Weight loss 01/22/2011  . COUGH 09/01/2010  . Multiple sclerosis ([place])   . HOCM (hypertrophic  obstructive cardiomyopathy) ([place])   . Right bundle branch block   . Complete heart block ([place]) 12/05/2007    Zachery Conch MOT, OTR/L  06/14/2020, 2:07 PM  [facility] [address] [place], [place], [address] Phone: [phone]   Fax:  [phone]  Name: Rodney Adkins MRN: [id number] Date of Birth: [birth year]

## 2020-06-14 NOTE — Therapy (Signed)
[facility] [address] [place], [place], [address] Phone: [phone]   Fax:  [phone]  Physical Therapy Treatment  Patient Details  Name: Rodney Adkins MRN: [id number] Date of Birth: 1956/01/19 Referring Provider (PT): Otto Herb, MD   Encounter Date: 06/14/2020   PT End of Session - 06/14/20 1236    Visit Number 7    Number of Visits 17    Authorization Type BCBS PPO - FOTO patient    PT Start Time 1234    PT Stop Time 1314    PT Time Calculation (min) 40 min    Equipment Utilized During Treatment Gait belt    Activity Tolerance Patient tolerated treatment well    Behavior During Therapy WFL for tasks assessed/performed           Past Medical History:  Diagnosis Date  . Complete heart block [facility]) June 2009   a. Presented with hypotension/pulm edema 12/2007 when HOCM was also discovered. s/p dual chamber MDT pacemaker. Thallium stest 7/09 negative for infiltrative disease or ischemia.  Followed Dr [name]  Marland Kitchen Heart murmur   . HOCM (hypertrophic obstructive cardiomyopathy) ([place]) 2009   Discovered by ECHO when he developed CHB. Mgmt Dr Caryl Comes  and Dr [name] at [facility]. Has outflow obstruction on ECHO 2011  . Multiple sclerosis ([place]) 1998   Sxs started 1998 with L body numbness. MRI c/w MS. Started Avonex. Saw Dr [name] at [facility] and switched to Betaseron and was on for 5 yrs but developed depression and site rxns and D/C'd 2006. While on Betaseron, occassionally required solumedrol for flares.  . Pacemaker 2017  . Prostate enlargement   . Right bundle branch block   . SOB (shortness of breath)    a. PFTs 09/2010 essentially normal per pulm note (mild obst defect). b. 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  . 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  Pacer reprogrammed with improvement in sx.    Past Surgical History:  Procedure Laterality Date  . LEFT HEART CATHETERIZATION WITH CORONARY ANGIOGRAM N/A 08/22/2013   Procedure: LEFT HEART CATHETERIZATION WITH CORONARY ANGIOGRAM;  Surgeon: [name], MD;  Location[facility] CATH LAB;  Service: Cardiovascular;  Laterality: N/A;  . LEG TENDON SURGERY  2008   Os peroneus resection and peroneal tendon repair  . pace Agricultural engineer    . PACEMAKER INSERTION  12/2007   Complete heart block  . PACEMAKER LEAD REMOVAL Left 07/18/2015   Procedure: ATRIAL PACEMAKER LEAD EXTRACTION; ATRIAL PACEMAKER LEAD INSERTION; GENERATOR REPLACEMENT.;  Surgeon: [name], MD;  Location: [place];  Service: Cardiovascular;  Laterality: Left;  [name] to back up  . PERIPHERAL VASCULAR CATHETERIZATION N/A 02/12/2015   Procedure: Venogram;  Surgeon: [name], MD;  Location: [place] CV LAB;  Service: Cardiovascular;  Laterality: N/A;  . TEE WITHOUT CARDIOVERSION N/A 07/18/2015   Procedure: TRANSESOPHAGEAL ECHOCARDIOGRAM (TEE);  Surgeon: [name], MD;  Location: [facility] OR;  Service: Cardiovascular;  Laterality: N/A;    There were no vitals filed for this visit.   Subjective Assessment - 06/14/20 1236    Subjective Pt reports that he has been doing his exercises. He does get tired as has all 3 disciplines to work on.    Patient is accompained by: Family member    Pertinent History MS,  CHB s/p PPM, HOCM    Limitations Walking;Writing;Standing;Lifting;House hold activities    Currently in Pain? No/denies                             [facility] Adult PT Treatment/Exercise - 06/14/20 1238      Ambulation/Gait   Ambulation/Gait Yes    Ambulation/Gait Assistance 5: Supervision;4: Min guard    Ambulation/Gait Assistance Details Pt was cued to try to increase right step length and relax right arm for more arm swing.    Ambulation Distance (Feet) 345 Feet    Assistive device Straight cane   with quad tip    Gait Pattern Step-through pattern;Decreased step length - right;Decreased hip/knee flexion - right    Ambulation Surface Level;Indoor      Neuro Re-ed    Neuro Re-ed Details  Dynamic gait activities with cane: over blue mat, weaving in and out of 4 cones, reciprocal steps over 4 hurdles CGA x 8 bouts. Pt was cued to increase foot clearance on trailing foot. Sit to stand from mat with folded blue mat under feet without hands x 10. Standing in blue mat tapping 3 different colored cones in front with RLE x 10 then with LLE with cane support and CGA. Standing on ramp with blue mat facing uphill without UE support x 30 sec eyes open, 30 sec eyes closed, stepping forward and back x 10 each leg with cane support CGA. Up/down ramp on blue mat with cane CGA. Pt then ambulated 230' with head turns for 115' on command and focus on larger steps on right for remaining.                  PT Education - 06/14/20 2026    Education Details Added to HEP    Person(s) Educated Patient    Methods Explanation;Demonstration;Handout    Comprehension Verbalized understanding            PT Short Term Goals - 05/21/20 1755      PT SHORT TERM GOAL #1   Title Pt will be independent with initial HEP.    Baseline NA    Time 4    Period Weeks    Status New    Target Date 06/18/20      PT SHORT TERM GOAL #2   Title Pt will decrease 5xSTS to </= 15 seconds for decreased fall risk.    Baseline 22 s without UE support from standard arm chair 05/21/20    Time 4    Period Weeks    Status New    Target Date 06/18/20      PT SHORT TERM GOAL #3   Title Pt will increase gait speed to >/= 0.75 m/s for increased access to community and decreased fall risk.    Baseline 0.65 m/s with RW 05/21/20    Time 4    Period Weeks    Status New    Target Date 06/18/20      PT SHORT TERM GOAL #4   Title Pt will increase Berg score by 4 points in order to decrease fall risk and increase static and dynamic balance by a  clincal detectable change (4 points = CDC).    Baseline 45/56 ind mod fall risk 05/21/20    Time 4    Period Weeks    Status New    Target Date 06/18/20  PT Long Term Goals - 05/21/20 1759      PT LONG TERM GOAL #1   Title Pt will be independent with progressive HEP.    Baseline NA    Time 8    Period Weeks    Status New    Target Date 07/16/20      PT LONG TERM GOAL #2   Title Pt will decrease 5xSTS to </= 12 seconds to no longer be classified as fall risk and to be WNL for age and gender.    Baseline 22 s from standard arm chair without UE support    Time 8    Period Weeks    Status New    Target Date 07/16/20      PT LONG TERM GOAL #3   Title Pt will reach >50/56 on Berg balance score for improved static and dynamic balance and decreased risk for falls.    Baseline 45/56 (mod fall risk) 05/21/20    Time 8    Period Weeks    Status New    Target Date 07/16/20      PT LONG TERM GOAL #4   Title Pt will reach 1.0 m/s gait speed for community ambulation and decreased fall risk.    Baseline 0.65 m/s    Time 8    Period Weeks    Status New    Target Date 07/16/20      PT LONG TERM GOAL #5   Title Pt will ambulated 800+ feet on outdoor/unlevel surfaces with supervision and LRAD.    Baseline NT    Time 8    Period Weeks    Status New    Target Date 07/16/20      Additional Long Term Goals   Additional Long Term Goals Yes      PT LONG TERM GOAL #6   Title Pt will increase FOTO from 46 to >/= 70 for increased confidence in abilities.    Baseline 46 05/21/20    Time 8    Period Weeks    Status New    Target Date 07/16/20                 Plan - 06/14/20 2027    Clinical Impression Statement PT continued to focus on gait with cane with quad tip. Pt did better with surface changes with cane today.    Personal Factors and Comorbidities Comorbidity 3+;Age    Comorbidities MS, CHB s/p PPM, HOCM    Examination-Activity Limitations  Squat;Locomotion Level;Transfers;Stairs;Stand    Examination-Participation Restrictions Yard Work;Occupation;Driving;Community Activity    Stability/Clinical Decision Making Evolving/Moderate complexity    Rehab Potential Good    PT Frequency 2x / week    PT Duration 8 weeks    PT Treatment/Interventions Gait training;Stair training;Functional mobility training;Therapeutic activities;Therapeutic exercise;Balance training;Neuromuscular re-education;Patient/family education;Orthotic Fit/Training;DME Instruction;ADLs/Self Care Home Management;Energy conservation;Manual techniques;Vestibular    PT Next Visit Plan Check STGs. gait training with SPC with quad tip (scanning, obstacles, over unlevel surfaces). sit <> stands.  corner balance exercises. Continue RLE strengthening and higher-level dynamic balance training. Outdoor gait training if weather permits. SciFit/NuStep    Consulted and Agree with Plan of Care Patient;Family member/caregiver    Family Member Consulted Wife           Patient will benefit from skilled therapeutic intervention in order to improve the following deficits and impairments:  Abnormal gait,Decreased coordination,Difficulty walking,Decreased endurance,Decreased activity tolerance,Impaired perceived functional ability,Decreased balance,Decreased mobility,Decreased strength,Improper body mechanics  Visit Diagnosis: Other  abnormalities of gait and mobility  Muscle weakness (generalized)     Problem List Patient Active Problem List   Diagnosis Date Noted  . Stroke ([place]) 05/16/2020  . Acute focal neurological deficit 05/15/2020  . BPH (benign prostatic hyperplasia) 02/02/2016  . Fractured atrial pacemaker lead wire 07/18/2015  . 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 Chest pain 08/02/2013  . SOB (shortness of breath) 08/02/2013  . PVC (premature ventricular contraction) 02/13/2013  . Chest pain on exertion 02/25/2012  . Pacemaker-dual-chamber Medtronic 04/29/2011   . Change in bowel habits 01/22/2011  . Weight loss 01/22/2011  . COUGH 09/01/2010  . Multiple sclerosis ([place])   . HOCM (hypertrophic obstructive cardiomyopathy) ([place])   . Right bundle branch block   . Complete heart block ([place]) 12/05/2007    Electa Sniff, PT, DPT, NCS 06/14/2020, 8:29 PM  [facility] [address] [place], [place], [address] Phone: [phone]   Fax:  [phone]  Name: Rodney Adkins MRN: [id number] Date of Birth: May 03, 1956

## 2020-06-14 NOTE — Patient Instructions (Signed)
Access Code: [email or web address] URL: [email or web address] Date: 06/14/2020 Prepared by: Cherly Anderson  Exercises Side Stepping with Counter Support - 2 x daily - 7 x weekly - 1 sets - 4 reps Mini Squat with Counter Support - 2 x daily - 5 x weekly - 2 sets - 10 reps Standing Marching - 2 x daily - 5 x weekly - 2 sets - 10 reps Tandem Stance - 2 x daily - 5 x weekly - 1 sets - 3 reps - 20 sec hold Romberg Stance with Head Nods - 1 x daily - 5 x weekly - 2 sets - 10 reps Wide Stance with Eyes Closed on Foam Pad - 1 x daily - 5 x weekly - 3 sets - 30 hold Alternating tapping cup at counter - 1 x daily - 5 x weekly - 2 sets - 10 reps

## 2020-06-15 LAB — CUP PACEART REMOTE DEVICE CHECK
Battery Remaining Longevity: 58 mo
Battery Voltage: 2.99 V
Brady Statistic AP VP Percent: 22.07 %
Brady Statistic AP VS Percent: 0 %
Brady Statistic AS VP Percent: 77.92 %
Brady Statistic AS VS Percent: 0.01 %
Brady Statistic RA Percent Paced: 22.07 %
Brady Statistic RV Percent Paced: 99.98 %
Date Time Interrogation Session: 20211210162455
Implantable Lead Implant Date: 20090608
Implantable Lead Implant Date: 20170112
Implantable Lead Location: 753859
Implantable Lead Location: 753860
Implantable Lead Model: 5076
Implantable Lead Model: 5076
Implantable Pulse Generator Implant Date: 20170112
Lead Channel Impedance Value: 323 Ohm
Lead Channel Impedance Value: 380 Ohm
Lead Channel Impedance Value: 418 Ohm
Lead Channel Impedance Value: 513 Ohm
Lead Channel Pacing Threshold Amplitude: 0.625 V
Lead Channel Pacing Threshold Amplitude: 0.875 V
Lead Channel Pacing Threshold Pulse Width: 0.4 ms
Lead Channel Pacing Threshold Pulse Width: 0.4 ms
Lead Channel Sensing Intrinsic Amplitude: 2.875 mV
Lead Channel Sensing Intrinsic Amplitude: 2.875 mV
Lead Channel Sensing Intrinsic Amplitude: 31.625 mV
Lead Channel Sensing Intrinsic Amplitude: 31.625 mV
Lead Channel Setting Pacing Amplitude: 2 V
Lead Channel Setting Pacing Amplitude: 2.5 V
Lead Channel Setting Pacing Pulse Width: 0.4 ms
Lead Channel Setting Sensing Sensitivity: 5.6 mV

## 2020-06-19 ENCOUNTER — Encounter: Payer: Self-pay | Admitting: Occupational Therapy

## 2020-06-19 ENCOUNTER — Ambulatory Visit: Payer: BC Managed Care – PPO | Admitting: Speech Pathology

## 2020-06-19 ENCOUNTER — Other Ambulatory Visit: Payer: Self-pay

## 2020-06-19 ENCOUNTER — Encounter: Payer: Self-pay | Admitting: Speech Pathology

## 2020-06-19 ENCOUNTER — Ambulatory Visit: Payer: BC Managed Care – PPO | Admitting: Occupational Therapy

## 2020-06-19 ENCOUNTER — Ambulatory Visit: Payer: BC Managed Care – PPO

## 2020-06-19 DIAGNOSIS — R471 Dysarthria and anarthria: Secondary | ICD-10-CM

## 2020-06-19 DIAGNOSIS — R1310 Dysphagia, unspecified: Secondary | ICD-10-CM

## 2020-06-19 DIAGNOSIS — M6281 Muscle weakness (generalized): Secondary | ICD-10-CM

## 2020-06-19 DIAGNOSIS — R2689 Other abnormalities of gait and mobility: Secondary | ICD-10-CM | POA: Diagnosis not present

## 2020-06-19 DIAGNOSIS — R278 Other lack of coordination: Secondary | ICD-10-CM

## 2020-06-19 NOTE — Therapy (Signed)
[facility] [address] [place], [place], [address] Phone: [phone]   Fax:  [phone]  Physical Therapy Treatment  Patient Details  Name: Rodney Adkins MRN: [id number] Date of Birth: [birth year] Referring Provider (PT): Otto Herb, MD   Encounter Date: 06/19/2020   PT End of Session - 06/19/20 0850    Visit Number 8    Number of Visits 17    Authorization Type BCBS PPO - FOTO patient    PT Start Time 0845    PT Stop Time 0929    PT Time Calculation (min) 44 min    Equipment Utilized During Treatment Gait belt    Activity Tolerance Patient tolerated treatment well    Behavior During Therapy [facility] for tasks assessed/performed           Past Medical History:  Diagnosis Date  . Complete heart block [facility]) June 2009   a. Presented with hypotension/pulm edema 12/2007 when HOCM was also discovered. s/p dual chamber MDT pacemaker. Thallium stest 7/09 negative for infiltrative disease or ischemia.  Followed Dr [name]  Marland Kitchen Heart murmur   . HOCM (hypertrophic obstructive cardiomyopathy) ([place]) 2009   Discovered by ECHO when he developed CHB. Mgmt Dr Caryl Comes  and Dr [name] at [facility]. Has outflow obstruction on ECHO 2011  . Multiple sclerosis ([place]) 1998   Sxs started 1998 with L body numbness. MRI c/w MS. Started Avonex. Saw Dr [name] at [facility] and switched to Betaseron and was on for 5 yrs but developed depression and site rxns and D/C'd 2006. While on Betaseron, occassionally required solumedrol for flares.  . Pacemaker 2017  . Prostate enlargement   . Right bundle branch block   . SOB (shortness of breath)    a. PFTs 09/2010 essentially normal per pulm note (mild obst defect). b. 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  . 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  Pacer reprogrammed with improvement in sx.    Past Surgical History:  Procedure Laterality Date  . LEFT HEART CATHETERIZATION WITH CORONARY ANGIOGRAM N/A 08/22/2013   Procedure: LEFT HEART CATHETERIZATION WITH CORONARY ANGIOGRAM;  Surgeon: [name], MD;  Location: [facility] CATH LAB;  Service: Cardiovascular;  Laterality: N/A;  . LEG TENDON SURGERY  2008   Os peroneus resection and peroneal tendon repair  . pace Agricultural engineer    . PACEMAKER INSERTION  12/2007   Complete heart block  . PACEMAKER LEAD REMOVAL Left 07/18/2015   Procedure: ATRIAL PACEMAKER LEAD EXTRACTION; ATRIAL PACEMAKER LEAD INSERTION; GENERATOR REPLACEMENT.;  Surgeon: [name], MD;  Location: [place];  Service: Cardiovascular;  Laterality: Left;  [name] to back up  . PERIPHERAL VASCULAR CATHETERIZATION N/A 02/12/2015   Procedure: Venogram;  Surgeon: [name], MD;  Location: [place] CV LAB;  Service: Cardiovascular;  Laterality: N/A;  . TEE WITHOUT CARDIOVERSION N/A 07/18/2015   Procedure: TRANSESOPHAGEAL ECHOCARDIOGRAM (TEE);  Surgeon: [name], MD;  Location: [facility] OR;  Service: Cardiovascular;  Laterality: N/A;    There were no vitals filed for this visit.   Subjective Assessment - 06/19/20 0850    Subjective Pt reports that he has noticed his right great toe flexing when walks. Is not a cramp but has trouble straightening it out. Does seem to occur worse in morning and when gets up to try  to walk. Has been stretching it some.    Patient is accompained by: Family member    Pertinent History MS, CHB s/p PPM, HOCM    Limitations Walking;Writing;Standing;Lifting;House hold activities    Currently in Pain? No/denies                             [facility] Adult PT Treatment/Exercise - 06/19/20 0856      Transfers   Transfers Sit to Stand;Stand to Sit    Sit to Stand 6: Modified independent (Device/Increase time)    Five time sit to stand comments  15.79 sec from chair with hands, without  hands=17.37 sec    Stand to Sit 6: Modified independent (Device/Increase time)      Ambulation/Gait   Ambulation/Gait Yes    Ambulation/Gait Assistance 5: Supervision    Ambulation/Gait Assistance Details Verbal cues for right arm swing as well as increasing right step length.    Ambulation Distance (Feet) 345 Feet    Assistive device Straight cane   with quad cane   Gait Pattern Step-through pattern;Decreased hip/knee flexion - right;Decreased step length - right    Ambulation Surface Level;Indoor    Gait velocity 10.15 sec with walker=0.98 m/s and with cane 12.72 sec=0.47ms    Stairs Yes    Stairs Assistance 4: Min guard;4: Min assist    Stairs Assistance Details (indicate cue type and reason) Verbal cues for sequencing. CGA going up but min assist/CGA with descent.    Stair Management Technique Step to pattern;With cane    Number of Stairs 12    Height of Stairs 6    Ramp 5: Supervision    Ramp Details (indicate cue type and reason) with cane      Standardized Balance Assessment   Standardized Balance Assessment Berg Balance Test      Berg Balance Test   Sit to Stand Able to stand without using hands and stabilize independently    Standing Unsupported Able to stand safely 2 minutes    Sitting with Back Unsupported but Feet Supported on Floor or Stool Able to sit safely and securely 2 minutes    Stand to Sit Sits safely with minimal use of hands    Transfers Able to transfer safely, minor use of hands    Standing Unsupported with Eyes Closed Able to stand 10 seconds safely    Standing Ubsupported with Feet Together Able to place feet together independently and stand 1 minute safely    From Standing, Reach Forward with Outstretched Arm Can reach confidently >25 cm (10")    From Standing Position, Pick up Object from Floor Able to pick up shoe safely and easily    From Standing Position, Turn to Look Behind Over each Shoulder Looks behind from both sides and weight shifts well     Turn 360 Degrees Able to turn 360 degrees safely one side only in 4 seconds or less    Standing Unsupported, Alternately Place Feet on Step/Stool Able to stand independently and safely and complete 8 steps in 20 seconds    Standing Unsupported, One Foot in Front Able to plae foot ahead of the other independently and hold 30 seconds    Standing on One Leg Tries to lift leg/unable to hold 3 seconds but remains standing independently    Total Score 51      Neuro Re-ed    Neuro Re-ed Details  Gait over obstacle course: weaving 4 cones  and reciprocal steps over 4 orange hurdles x 6 bouts CGA.                  PT Education - 06/19/20 [phone]    Education Details Discussed results of testing and progress thus far. Also advised stretching right great toe in to extension especially first thing in morning or when acting up more.    Person(s) Educated Patient    Methods Explanation    Comprehension Verbalized understanding            PT Short Term Goals - 06/19/20 8242      PT SHORT TERM GOAL #1   Title Pt will be independent with initial HEP.    Baseline Pt reports that he has been working on the exercises.    Time 4    Period Weeks    Status Achieved    Target Date 06/18/20      PT SHORT TERM GOAL #2   Title Pt will decrease 5xSTS to </= 15 seconds for decreased fall risk.    Baseline 22 s without UE support from standard arm chair 05/21/20. 06/19/20 15.79 sec from chair with hands and 17.37 sec without hands    Time 4    Period Weeks    Status Not Met    Target Date 06/18/20      PT SHORT TERM GOAL #3   Title Pt will increase gait speed to >/= 0.75 m/s for increased access to community and decreased fall risk.    Baseline 0.65 m/s with RW 05/21/20. 0.49ms with walker and 0.740m with cane    Time 4    Period Weeks    Status Achieved    Target Date 06/18/20      PT SHORT TERM GOAL #4   Title Pt will increase Berg score by 4 points in order to decrease fall risk and  increase static and dynamic balance by a clincal detectable change (4 points = CDC).    Baseline 45/56 ind mod fall risk 05/21/20. 06/19/20 Berg=51/56    Time 4    Period Weeks    Status Achieved    Target Date 06/18/20             PT Long Term Goals - 06/19/20 0940      PT LONG TERM GOAL #1   Title Pt will be independent with progressive HEP.    Baseline NA    Time 8    Period Weeks    Status New      PT LONG TERM GOAL #2   Title Pt will decrease 5xSTS to </= 12 seconds to no longer be classified as fall risk and to be WNL for age and gender.    Baseline 22 s from standard arm chair without UE support. 06/19/20 17.37 sec    Time 8    Period Weeks    Status New      PT LONG TERM GOAL #3   Title Pt will reach >52/56 on Berg balance score for improved static and dynamic balance and decreased risk for falls.    Baseline 45/56 (mod fall risk) 05/21/20. 06/19/20 51/56    Time 8    Period Weeks    Status Revised      PT LONG TERM GOAL #4   Title Pt will reach 1.0 m/s gait speed for community ambulation and decreased fall risk.    Baseline 0.65 m/s. 06/19/20 0.9873mwith walker and 0.63m28mith cane  Time 8    Period Weeks    Status New      PT LONG TERM GOAL #5   Title Pt will ambulated 800+ feet on outdoor/unlevel surfaces with supervision and LRAD.    Baseline NT    Time 8    Period Weeks    Status New      PT LONG TERM GOAL #6   Title Pt will increase FOTO from 46 to >/= 70 for increased confidence in abilities.    Baseline 46 05/21/20    Time 8    Period Weeks    Status New                 Plan - 06/19/20 0943    Clinical Impression Statement PT reassessed STGs today. Pt making excellent progress meeting 3/4 goals. He increased gait speed with walker to 0.4ms showing improving community mobility. PT also assessed with cane today and was 0.770m which also met goal. Improvement in Berg testing to 51/56 showing improving balance and decreased fall  risk. Updated LTG to further progress this. Pt decreased time on 5 x sit to stand showing improving balance and functional strength but short of goal on this. He continues to show improving right foot clearance with gait progressing more towards cane at this time. Pt will continue to benefit from skilled PT to further progress strength, balance and gait.    Personal Factors and Comorbidities Comorbidity 3+;Age    Comorbidities MS, CHB s/p PPM, HOCM    Examination-Activity Limitations Squat;Locomotion Level;Transfers;Stairs;Stand    Examination-Participation Restrictions Yard Work;Occupation;Driving;Community Activity    Stability/Clinical Decision Making Evolving/Moderate complexity    Rehab Potential Good    PT Frequency 2x / week    PT Duration 8 weeks    PT Treatment/Interventions Gait training;Stair training;Functional mobility training;Therapeutic activities;Therapeutic exercise;Balance training;Neuromuscular re-education;Patient/family education;Orthotic Fit/Training;DME Instruction;ADLs/Self Care Home Management;Energy conservation;Manual techniques;Vestibular    PT Next Visit Plan gait training with SPC with quad tip (scanning, obstacles, over unlevel surfaces). sit <> stands.  corner balance exercises. Continue RLE strengthening and higher-level dynamic balance training. Outdoor gait training if weather permits. SciFit/NuStep    Consulted and Agree with Plan of Care Patient;Family member/caregiver    Family Member Consulted Wife           Patient will benefit from skilled therapeutic intervention in order to improve the following deficits and impairments:  Abnormal gait,Decreased coordination,Difficulty walking,Decreased endurance,Decreased activity tolerance,Impaired perceived functional ability,Decreased balance,Decreased mobility,Decreased strength,Improper body mechanics  Visit Diagnosis: Other abnormalities of gait and mobility  Muscle weakness (generalized)     Problem  List Patient Active Problem List   Diagnosis Date Noted  . Stroke ([place]  . Acute focal neurological deficit 05/15/2020  . BPH (benign prostatic hyperplasia) 02/02/2016  . Fractured atrial pacemaker lead wire 07/18/2015  . 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 Chest pain 08/02/2013  . SOB (shortness of breath) 08/02/2013  . PVC (premature ventricular contraction) 02/13/2013  . Chest pain on exertion 02/25/2012  . Pacemaker-dual-chamber Medtronic 04/29/2011  . Change in bowel habits 01/22/2011  . Weight loss 01/22/2011  . COUGH 09/01/2010  . Multiple sclerosis ([place]  . HOCM (hypertrophic obstructive cardiomyopathy) ([place]  . Right bundle branch block   . Complete heart block ([place]06/07/2007    EmElecta SniffPT, DPT, NCS 06/19/2020, 9:47 AM  [id number]: [phone] Fax:  [phone]: DoGurman  ANIAS BARTOL MRN: [id number] Date of Birth: 21-Apr-1956

## 2020-06-19 NOTE — Patient Instructions (Addendum)
  Coordination Activities  Perform the following activities for 20 minutes 1 times per day with right hand(s).Perform in seated position   Rotate ball in fingertips (clockwise and counter-clockwise).  Flip cards 1 at a time.  Deal cards with your thumb (Hold deck in hand and push card off top with thumb).  Pick up coins and place in container or coin bank.  Pick up coins and stack.  Pick up coins one at a time until you get 5-10 in your hand, then move coins from palm to fingertips to place in container one at a time.  Toss ball between hands   Practice writing and/or typing.

## 2020-06-19 NOTE — Patient Instructions (Signed)
°  Practice 10 abdominal breaths  Straw phonation or gargle 2 minutes prior to PHoRTE and throughout the day and before any prolonged speaking  PHoRTE - twice a day  [email or web address]  1.  10 Loud AH's as loud as you can and as long as you can (HA)       To help coach you at home with your loud AH!!       YouTube: Speech Therapy Practice - Sustained Phonation /ah/ 10 times - Phonatory                  Resistance Strengthening Exercises Speech Therapy       [email or web address]        [email or web address]    2. 10 Pitch glides up, 10 pitch glides down   3. 10 sentences in loud high pitch voice, like you are calling your neighbor over the phone  4. 10 sentences in loud low authoritative pitch, like you are the boss  Use a good belly breath before each exercise- feel your abs contract in as you use your voice   Use a good abdominal breath to power your voice when talking

## 2020-06-19 NOTE — Therapy (Signed)
[facility] [address] [place], [place], [address] Phone: [phone]   Fax:  [phone]  Occupational Therapy Treatment  Patient Details  Name: Rodney Adkins MRN: [id number] Date of Birth: [birth year] Referring Provider (OT): Darliss Cheney, MD   Encounter Date: 06/19/2020   OT End of Session - 06/19/20 1042    Visit Number 5    Number of Visits 17    Date for OT Re-Evaluation 07/19/20    Authorization Type State BCBS    Authorization Time Period VL:MN    OT Start Time 1021    OT Stop Time 1100    OT Time Calculation (min) 39 min    Activity Tolerance Patient tolerated treatment well    Behavior During Therapy [facility] for tasks assessed/performed           Past Medical History:  Diagnosis Date  . Complete heart block [facility]) June 2009   a. Presented with hypotension/pulm edema 12/2007 when HOCM was also discovered. s/p dual chamber MDT pacemaker. Thallium stest 7/09 negative for infiltrative disease or ischemia.  Followed Dr [name]  Marland Kitchen Heart murmur   . HOCM (hypertrophic obstructive cardiomyopathy) ([facility]) 2009   Discovered by ECHO when he developed CHB. Mgmt Dr Caryl Comes  and Dr [name] at [facility]. Has outflow obstruction on ECHO 2011  . Multiple sclerosis ([place]) 1998   Sxs started 1998 with L body numbness. MRI c/w MS. Started Avonex. Saw Dr [name] at [facility] and switched to Betaseron and was on for 5 yrs but developed depression and site rxns and D/C'd 2006. While on Betaseron, occassionally required solumedrol for flares.  . Pacemaker 2017  . Prostate enlargement   . Right bundle branch block   . SOB (shortness of breath)    a. PFTs 09/2010 essentially normal per pulm note (mild obst defect). b. 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  . Wenckebach    a. Decreased ex tolerance 2010/2012 - underwent stress echo to look @ effects of MV/LVOT - nothing noted; although pt had high rate Wenckebach behavior with associated  ex intol. Pacer reprogrammed with improvement in sx.    Past Surgical History:  Procedure Laterality Date  . LEFT HEART CATHETERIZATION WITH CORONARY ANGIOGRAM N/A 08/22/2013   Procedure: LEFT HEART CATHETERIZATION WITH CORONARY ANGIOGRAM;  Surgeon: [name], MD;  Location: [facility] CATH LAB;  Service: Cardiovascular;  Laterality: N/A;  . LEG TENDON SURGERY  2008   Os peroneus resection and peroneal tendon repair  . pace Agricultural engineer    . PACEMAKER INSERTION  12/2007   Complete heart block  . PACEMAKER LEAD REMOVAL Left 07/18/2015   Procedure: ATRIAL PACEMAKER LEAD EXTRACTION; ATRIAL PACEMAKER LEAD INSERTION; GENERATOR REPLACEMENT.;  Surgeon: [name], MD;  Location: [facility];  Service: Cardiovascular;  Laterality: Left;  [name] to back up  . PERIPHERAL VASCULAR CATHETERIZATION N/A 02/12/2015   Procedure: Venogram;  Surgeon: [name], MD;  Location: [place] [facility] CV LAB;  Service: Cardiovascular;  Laterality: N/A;  . TEE WITHOUT CARDIOVERSION N/A 07/18/2015   Procedure: TRANSESOPHAGEAL ECHOCARDIOGRAM (TEE);  Surgeon: [name], MD;  Location: [facility] OR;  Service: Cardiovascular;  Laterality: N/A;    There were no vitals filed for this visit.   Subjective Assessment - 06/19/20 1038    Subjective  Pt reports difficulty using RUE    Pertinent History Fall Risk. Speech Deficits.    Limitations MS, CHB s/p PPM, HOCM    Patient Stated Goals right hand. Getting  hand working better with typing, working mouse on Teaching laboratory technician, Administrator, [employer]. Return to driving recommendations.    Currently in Pain? No/denies                  Treatment: copying peg design with medium sized pegs using RUE, min difficulty, removing with in hand manipulation. Handwriting activities for increased legibility and accuracy, then distal finger control worksheet with coban wrapped around pen for built up grip, min v.c              OT Education - 06/19/20 1048    Education Details Reviewed coordination HEP  issued at inital visit and issued handout, min v.c    Person(s) Educated Patient    Methods Explanation;Demonstration;Handout;Verbal cues    Comprehension Verbalized understanding;Returned demonstration;Verbal cues required            OT Short Term Goals - 06/05/20 1055      OT SHORT TERM GOAL #1   Title Pt will be independent with HEP 06/21/20    Time 4    Period Weeks    Status Achieved    Target Date 06/21/20      OT SHORT TERM GOAL #2   Title Pt will demonstrate improved fine motor coordination by demonstrating decrease in 9 hole peg test score by 6 seconds with RUE.    Baseline RUE 71.06    Time 4    Period Weeks    Status On-going      OT SHORT TERM GOAL #3   Title Pt will perform simple warm meal prep or home management task with supervision and good safety awareness.    Time 4    Period Weeks    Status New      OT SHORT TERM GOAL #4   Title Pt will write 2-3 sentences with 75% legibility and increased size of letters (mild micrographia).    Baseline severe micrographia    Time 4    Period Weeks    Status On-going      OT SHORT TERM GOAL #5   Title Pt will demonstrate increase in grip strength by 5 lbs in RUE for increased ease with ADLs and clothing management, etc.    Baseline RUE 45.4 (LUE 65.6)    Time 4    Period Weeks    Status On-going      OT SHORT TERM GOAL #6   Title Pt will report using RUE (dominant hand) for 25% or more of functional tasks.    Time 4    Period Weeks    Status On-going             OT Long Term Goals - 05/24/20 1651      OT LONG TERM GOAL #1   Title Pt will be independent with updated HEP 07/19/2020    Time 8    Period Weeks    Status New    Target Date 07/19/20      OT LONG TERM GOAL #2   Title Pt will demonstrate improved fine motor coordination by demonstrating decrease in 9 hole peg test score by 15 seconds with RUE.    Time 8    Period Weeks    Status New      OT LONG TERM GOAL #3   Title Pt will perform  work simulated tasks (typing, using mouse, etc) with RUE as dominant hand with 90% accuracy.    Time 8    Period Weeks    Status New  OT LONG TERM GOAL #4   Title Pt will report completing at least 1 home management task per day with mod I in order to decrease caregiver burden.    Time 8    Period Weeks    Status New      OT LONG TERM GOAL #5   Title Pt will improve grip strength by at least 10 lbs in RUE for strengthening dominant hand for functional use and tasks.    Baseline RUE 45.4 (LUE 65.6)    Time 8    Period Weeks    Status New      OT LONG TERM GOAL #6   Title Pt will report using RUE (dominant hand) for at least 75% of functional tasks per day.    Time 8    Period Weeks    Status New      OT LONG TERM GOAL #7   Title Pt will perform physical and cognitive tasks simultaneously with 95% accuracy for returning to prior tasks.    Time 8    Period Weeks    Status New      OT LONG TERM GOAL #8   Title Pt will write 3-5 sentences with 90% legibility and appropriate sizing.    Time 8    Period Weeks    Status New                  Patient will benefit from skilled therapeutic intervention in order to improve the following deficits and impairments:           Visit Diagnosis: Other lack of coordination  Muscle weakness (generalized)    Problem List Patient Active Problem List   Diagnosis Date Noted  . Stroke ([place]) 05/16/2020  . Acute focal neurological deficit 05/15/2020  . BPH (benign prostatic hyperplasia) 02/02/2016  . Fractured atrial pacemaker lead wire 07/18/2015  . 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 Chest pain 08/02/2013  . SOB (shortness of breath) 08/02/2013  . PVC (premature ventricular contraction) 02/13/2013  . Chest pain on exertion 02/25/2012  . Pacemaker-dual-chamber Medtronic 04/29/2011  . Change in bowel habits 01/22/2011  . Weight loss 01/22/2011  . COUGH 09/01/2010  . Multiple sclerosis ([place])   . HOCM (hypertrophic  obstructive cardiomyopathy) ([place])   . Right bundle branch block   . Complete heart block ([place]) 12/05/2007    Cesar Rogerson 06/19/2020, 10:51 AM  [facility] [address] [place], [place], [address] Phone: [phone]   Fax:  [phone]  Name: EDGARDO PETRENKO MRN: [id number] Date of Birth: 1956-04-09

## 2020-06-19 NOTE — Therapy (Signed)
[facility] [address] [place], [place], [address] Phone: [phone]   Fax:  [phone]  Speech Language Pathology Treatment  Patient Details  Name: Rodney Adkins MRN: [id number] Date of Birth: 1956/01/28 Referring Provider (SLP): Darliss Cheney,  MD   Encounter Date: 06/19/2020   End of Session - 06/19/20 1020    Visit Number 2    Number of Visits 17    Date for SLP Re-Evaluation 09/10/20    SLP Start Time 0932    SLP Stop Time  1012    SLP Time Calculation (min) 40 min    Activity Tolerance Patient tolerated treatment well           Past Medical History:  Diagnosis Date  . Complete heart block [facility]) June 2009   a. Presented with hypotension/pulm edema 12/2007 when HOCM was also discovered. s/p dual chamber MDT pacemaker. Thallium stest 7/09 negative for infiltrative disease or ischemia.  Followed Dr [name]  Marland Kitchen Heart murmur   . HOCM (hypertrophic obstructive cardiomyopathy) ([place]) 2009   Discovered by ECHO when he developed CHB. Mgmt Dr Caryl Comes  and Dr [name] at [facility]. Has outflow obstruction on ECHO 2011  . Multiple sclerosis ([place]) 1998   Sxs started 1998 with L body numbness. MRI c/w MS. Started Avonex. Saw Dr [name] at [facility] and switched to Betaseron and was on for 5 yrs but developed depression and site rxns and D/C'd 2006. While on Betaseron, occassionally required solumedrol for flares.  . Pacemaker 2017  . Prostate enlargement   . Right bundle branch block   . SOB (shortness of breath)    a. PFTs 09/2010 essentially normal per pulm note (mild obst defect). b. 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  . 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 Pacer reprogrammed with improvement in sx.    Past Surgical History:  Procedure Laterality Date  . LEFT  HEART CATHETERIZATION WITH CORONARY ANGIOGRAM N/A 08/22/2013   Procedure: LEFT HEART CATHETERIZATION WITH CORONARY ANGIOGRAM;  Surgeon: [name], MD;  Location: [facility] CATH LAB;  Service: Cardiovascular;  Laterality: N/A;  . LEG TENDON SURGERY  2008   Os peroneus resection and peroneal tendon repair  . pace Agricultural engineer    . PACEMAKER INSERTION  12/2007   Complete heart block  . PACEMAKER LEAD REMOVAL Left 07/18/2015   Procedure: ATRIAL PACEMAKER LEAD EXTRACTION; ATRIAL PACEMAKER LEAD INSERTION; GENERATOR REPLACEMENT.;  Surgeon: [name], MD;  Location: [facility];  Service: Cardiovascular;  Laterality: Left;  [name] to back up  . PERIPHERAL VASCULAR CATHETERIZATION N/A 02/12/2015   Procedure: Venogram;  Surgeon: [name], MD;  Location: [facility] CV LAB;  Service: Cardiovascular;  Laterality: N/A;  . TEE WITHOUT CARDIOVERSION N/A 07/18/2015   Procedure: TRANSESOPHAGEAL ECHOCARDIOGRAM (TEE);  Surgeon: [name], MD;  Location: [facility] OR;  Service: Cardiovascular;  Laterality: N/A;    There were no vitals filed for this visit.   Subjective Assessment - 06/19/20 0941    Subjective "I have a lot of slurring"    Currently in Pain? No/denies                 ADULT SLP TREATMENT - 06/19/20 0941      General Information   Behavior/Cognition Alert;Cooperative;Pleasant mood      Treatment Provided  Treatment provided Cognitive-Linquistic      Cognitive-Linquistic Treatment   Treatment focused on Dysarthria;Patient/family/caregiver education    Skilled Treatment Rodney Adkins enters room sub WNL volume 67dB. Loud /a/ averaged 85dB. With added Hey, dB or quality did not change. Some pressed phonation with loud /a/. Modified to HA to improve air flow and reduce tension. Introduced [employer], hospital) and provided youtube link for home practice. Pt benefitted from visual drawing and following bell curve for pitch glides - frequent mod A to start and finish  with lower pitch. High pitch sentences with occasional min A and modelin. Lower ptich sentences required frequent mod verbal cues, modeling to ID and correct pressed phonation. May want to consider straw phonation warm ups to reduce laryngeal tension. MTD not noted in conversational speech. When loud /a / and sentences had more volume, vocal quality improved.      Assessment / Recommendations / Plan   Plan Continue with current plan of care      Progression Toward Goals   Progression toward goals Progressing toward goals            SLP Education - 06/19/20 1016    Education Details PhORTE, breath support for HEP, pressed vs air flow voice, ENT consult if no improvement            SLP Short Term Goals - 06/19/20 1017      SLP SHORT TERM GOAL #1   Title pt will undergo objective swallow assessment    Time 2    Period Weeks    Status On-going      SLP SHORT TERM GOAL #2   Title pt will demonstrate dysarthria HEP with rare min A over 3 sessions    Time 4    Period Weeks   or 9 sessions, for all STGs   Status On-going      SLP SHORT TERM GOAL #3   Title pt will demonstrage voice HEP with rare min A over 3 sessions    Time 4    Period Weeks    Status New      SLP SHORT TERM GOAL #4   Title pt will engage in 8 mintues conversation maintaining gentle voice in 3 sesions, to limit possibility of habitualizing muscle tension dysphonia    Time 4    Period Weeks    Status On-going      SLP SHORT TERM GOAL #5   Title pt will demonstrate speech compensations in sentence responses 80% of the time, in 3 sesssions    Time 4    Period Weeks    Status On-going            SLP Long Term Goals - 06/19/20 1018      SLP LONG TERM GOAL #1   Title pt will demonstrate voice HEP with modified indpendnece in 3 sessions    Time 8    Period Weeks   or 17 sessions, for all LTGs   Status On-going      SLP LONG TERM GOAL #2   Title pt will engage in 12 mintues conversation maintaining  gentle voice 80% of the time, to limit possibility of habitualizing muscle tension dysphonia    Time 8    Period Weeks    Status On-going      SLP LONG TERM GOAL #3   Title pt will engage in 12 mintues mod complex conversation using speech strategies 80% of the time, in 3 sessions  Time 8    Period Weeks    Status On-going      SLP LONG TERM GOAL #4   Title pt will demonstrate improved score on Voice-related quality of life (QOL) measure (indicating better QOL) from therapy session #1    Time 8    Period Weeks    Status On-going            Plan - 06/19/20 1017    Clinical Impression Statement Rodney Adkins "Rodney Adkins" presents today with mild flaccid dysarthria, and 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 Today he was provided with an exericse that is part of the [facility] program, as well as a general swallowing exercise. A modified barium swallow exam is recommended as pt's swallow ability has not improved dramatically over the last 4 weeks. Skilled ST is necessary to improve pt's swallowing, speech, adn voice quality. If pt's voice does not show improvement a referral to ENT may be necessary.    Speech Therapy Frequency 2x / week    Duration 8 weeks   17 visits   Treatment/Interventions Aspiration precaution training;Pharyngeal strengthening exercises;Diet toleration management by SLP;Trials of upgraded texture/liquids;Cueing hierarchy;SLP instruction and feedback;Functional tasks;Oral motor exercises;Compensatory strategies;Patient/family education;Internal/external aids;Other (comment)           Patient will benefit from skilled therapeutic intervention in order to improve the following deficits and impairments:   Dysarthria and anarthria  Dysphagia, unspecified type    Problem List Patient Active Problem List   Diagnosis Date Noted  . Stroke ([place]) 05/16/2020  . Acute focal neurological deficit 05/15/2020  . BPH (benign prostatic  hyperplasia) 02/02/2016  . Fractured atrial pacemaker lead wire 07/18/2015  . 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 Chest pain 08/02/2013  . SOB (shortness of breath) 08/02/2013  . PVC (premature ventricular contraction) 02/13/2013  . Chest pain on exertion 02/25/2012  . Pacemaker-dual-chamber Medtronic 04/29/2011  . Change in bowel habits 01/22/2011  . Weight loss 01/22/2011  . COUGH 09/01/2010  . Multiple sclerosis ([place])   . HOCM (hypertrophic obstructive cardiomyopathy) ([place])   . Right bundle branch block   . Complete heart block ([facility]) 12/05/2007    Maliq Pilley, Annye Rusk MS, CCC-SLP 06/19/2020, 12:10 PM  [facility] [address] [place], [place], [address] Phone: [phone]   Fax:  [phone]   Name: Rodney Adkins MRN: [id number] Date of Birth: 07-Oct-1955

## 2020-06-21 ENCOUNTER — Ambulatory Visit: Payer: BC Managed Care – PPO | Admitting: Physical Therapy

## 2020-06-21 ENCOUNTER — Other Ambulatory Visit: Payer: Self-pay

## 2020-06-21 ENCOUNTER — Ambulatory Visit: Payer: BC Managed Care – PPO | Admitting: Occupational Therapy

## 2020-06-21 ENCOUNTER — Encounter: Payer: Self-pay | Admitting: Occupational Therapy

## 2020-06-21 ENCOUNTER — Encounter: Payer: Self-pay | Admitting: Physical Therapy

## 2020-06-21 ENCOUNTER — Ambulatory Visit: Payer: BC Managed Care – PPO

## 2020-06-21 DIAGNOSIS — R278 Other lack of coordination: Secondary | ICD-10-CM

## 2020-06-21 DIAGNOSIS — R2689 Other abnormalities of gait and mobility: Secondary | ICD-10-CM | POA: Diagnosis not present

## 2020-06-21 DIAGNOSIS — R41840 Attention and concentration deficit: Secondary | ICD-10-CM

## 2020-06-21 DIAGNOSIS — M6281 Muscle weakness (generalized): Secondary | ICD-10-CM

## 2020-06-21 DIAGNOSIS — R1310 Dysphagia, unspecified: Secondary | ICD-10-CM

## 2020-06-21 DIAGNOSIS — R471 Dysarthria and anarthria: Secondary | ICD-10-CM

## 2020-06-21 DIAGNOSIS — R490 Dysphonia: Secondary | ICD-10-CM

## 2020-06-21 NOTE — Therapy (Signed)
[facility] [address] [place], [place], [address] Phone: [phone]   Fax:  [phone]  Physical Therapy Treatment  Patient Details  Name: Rodney Adkins MRN: [id number] Date of Birth: 24-Dec-1955 Referring Provider (PT): Otto Herb, MD   Encounter Date: 06/21/2020   PT End of Session - 06/21/20 1031    Visit Number 9    Number of Visits 17    Authorization Type BCBS PPO - FOTO patient    PT Start Time 0933    PT Stop Time 1015    PT Time Calculation (min) 42 min    Equipment Utilized During Treatment Gait belt    Activity Tolerance Patient tolerated treatment well    Behavior During Therapy WFL for tasks assessed/performed           Past Medical History:  Diagnosis Date  . Complete heart block [facility]) June 2009   a. Presented with hypotension/pulm edema 12/2007 when HOCM was also discovered. s/p dual chamber MDT pacemaker. Thallium stest 7/09 negative for infiltrative disease or ischemia.  Followed Dr [name]  Marland Kitchen Heart murmur   . HOCM (hypertrophic obstructive cardiomyopathy) ([place]) 2009   Discovered by ECHO when he developed CHB. Mgmt Dr Caryl Comes  and Dr [name] at [facility]. Has outflow obstruction on ECHO 2011  . Multiple sclerosis ([place]) 1998   Sxs started 1998 with L body numbness. MRI c/w MS. Started Avonex. Saw Dr [name] at [facility] and switched to Betaseron and was on for 5 yrs but developed depression and site rxns and D/C'd 2006. While on Betaseron, occassionally required solumedrol for flares.  . Pacemaker 2017  . Prostate enlargement   . Right bundle branch block   . SOB (shortness of breath)    a. PFTs 09/2010 essentially normal per pulm note (mild obst defect). b. 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  . 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  Pacer reprogrammed with improvement in sx.    Past Surgical History:  Procedure Laterality Date  . LEFT HEART CATHETERIZATION WITH CORONARY ANGIOGRAM N/A 08/22/2013   Procedure: LEFT HEART CATHETERIZATION WITH CORONARY ANGIOGRAM;  Surgeon: [name], MD;  Location: [facility] CATH LAB;  Service: Cardiovascular;  Laterality: N/A;  . LEG TENDON SURGERY  2008   Os peroneus resection and peroneal tendon repair  . pace Agricultural engineer    . PACEMAKER INSERTION  12/2007   Complete heart block  . PACEMAKER LEAD REMOVAL Left 07/18/2015   Procedure: ATRIAL PACEMAKER LEAD EXTRACTION; ATRIAL PACEMAKER LEAD INSERTION; GENERATOR REPLACEMENT.;  Surgeon: [name], MD;  Location: [place];  Service: Cardiovascular;  Laterality: Left;  [name] to back up  . PERIPHERAL VASCULAR CATHETERIZATION N/A 02/12/2015   Procedure: Venogram;  Surgeon: [name], MD;  Location: [facility] CV LAB;  Service: Cardiovascular;  Laterality: N/A;  . TEE WITHOUT CARDIOVERSION N/A 07/18/2015   Procedure: TRANSESOPHAGEAL ECHOCARDIOGRAM (TEE);  Surgeon: [name], MD;  Location: [facility] OR;  Service: Cardiovascular;  Laterality: N/A;    There were no vitals filed for this visit.   Subjective Assessment - 06/21/20 0937    Subjective Had a fall yesterday - was getting out of a swivel chair (thinks he did it too quickly) and lost his equilibrium. Was able to get up right away. Reports R buttock is hurting a little  bit. Walked in with his walking stick today - was in a rush to get here.    Patient is accompained by: Family member    Pertinent History MS, CHB s/p PPM, HOCM    Limitations Walking;Writing;Standing;Lifting;House hold activities    Patient Stated Goals wants to be able to walk better.    Currently in Pain? Yes    Pain Score --   "just a little bit of pain in R hip"                            [facility] Adult PT Treatment/Exercise - 06/21/20 0001      Transfers   Transfers Sit to Stand;Stand to Sit    Sit  to Stand 5: Supervision    Stand to Sit 5: Supervision    Comments pt sometimes performs sit > stand with L heel up and foot not fully on the ground and staggered posteriorly, discussed with pt importance about having feet with a more wide BOS and have feet in line for more balance due to recent fall after standing up too quickly/turning from a swively chair      Ambulation/Gait   Ambulation/Gait Yes    Ambulation/Gait Assistance 5: Supervision;4: Min guard    Ambulation/Gait Assistance Details one lap of gait with walking pole with quick starts/stops and head motions - no LOB. then performed gait outdoors with min guard at times when going down inclines and episodes of incr R foot scuffing on concrete. trialed R foot up brace to help assist with foot clearance with pt demonstating improved heel strike and foot clearance throughout remainder of session. discussed with pt potential future use of one esp for outdoor gait and when feeling more fatigued, will continue to use in session to trial    Ambulation Distance (Feet) 200 Feet   x1, 115 x 3 plus additional clinic distances   Assistive device Other (Comment)   single walking pole   Gait Pattern Step-through pattern;Decreased hip/knee flexion - right;Decreased step length - right    Ambulation Surface Level;Unlevel;Indoor;Outdoor;Paved    Curb 5: Supervision    Curb Details (indicate cue type and reason) --   with single walking pole              Balance Exercises - 06/21/20 0001      Balance Exercises: Standing   Stepping Strategy Posterior;Foam/compliant surface;UE support;Limitations    Stepping Strategy Limitations x10 reps each leg stepping off blue air ex, incr difficulty at times with RLE foot clearance    Marching Foam/compliant surface;Upper extremity assist 1;Static;5 reps;10 reps;Limitations   2 x 10 reps   Marching Limitations with red theraband resistance around thighs - upgraded HEP    Sit to Stand Standard surface;Without  upper extremity support;Foam/compliant surface;Upper extremity support;Limitations    Sit to Stand Limitations x7 reps from blue air ex with no UE support, x7 reps from blue foam beam with UE support cues for eccentric control             PT Education - 06/21/20 1030    Education Details upgraded marching for HEP with red tband resistance, potential use of foot up brace to help with R foot clearance when more fatigued - will continue to practice with in session    Person(s) Educated Patient    Methods Explanation;Demonstration    Comprehension Verbalized understanding;Returned demonstration            PT Short  Term Goals - 06/19/20 7741      PT SHORT TERM GOAL #1   Title Pt will be independent with initial HEP.    Baseline Pt reports that he has been working on the exercises.    Time 4    Period Weeks    Status Achieved    Target Date 06/18/20      PT SHORT TERM GOAL #2   Title Pt will decrease 5xSTS to </= 15 seconds for decreased fall risk.    Baseline 22 s without UE support from standard arm chair 05/21/20. 06/19/20 15.79 sec from chair with hands and 17.37 sec without hands    Time 4    Period Weeks    Status Not Met    Target Date 06/18/20      PT SHORT TERM GOAL #3   Title Pt will increase gait speed to >/= 0.75 m/s for increased access to community and decreased fall risk.    Baseline 0.65 m/s with RW 05/21/20. 0.75ms with walker and 0.787m with cane    Time 4    Period Weeks    Status Achieved    Target Date 06/18/20      PT SHORT TERM GOAL #4   Title Pt will increase Berg score by 4 points in order to decrease fall risk and increase static and dynamic balance by a clincal detectable change (4 points = CDC).    Baseline 45/56 ind mod fall risk 05/21/20. 06/19/20 Berg=51/56    Time 4    Period Weeks    Status Achieved    Target Date 06/18/20             PT Long Term Goals - 06/19/20 0940      PT LONG TERM GOAL #1   Title Pt will be independent  with progressive HEP.    Baseline NA    Time 8    Period Weeks    Status New      PT LONG TERM GOAL #2   Title Pt will decrease 5xSTS to </= 12 seconds to no longer be classified as fall risk and to be WNL for age and gender.    Baseline 22 s from standard arm chair without UE support. 06/19/20 17.37 sec    Time 8    Period Weeks    Status New      PT LONG TERM GOAL #3   Title Pt will reach >52/56 on Berg balance score for improved static and dynamic balance and decreased risk for falls.    Baseline 45/56 (mod fall risk) 05/21/20. 06/19/20 51/56    Time 8    Period Weeks    Status Revised      PT LONG TERM GOAL #4   Title Pt will reach 1.0 m/s gait speed for community ambulation and decreased fall risk.    Baseline 0.65 m/s. 06/19/20 0.9843mwith walker and 0.75m93mith cane    Time 8    Period Weeks    Status New      PT LONG TERM GOAL #5   Title Pt will ambulated 800+ feet on outdoor/unlevel surfaces with supervision and LRAD.    Baseline NT    Time 8    Period Weeks    Status New      PT LONG TERM GOAL #6   Title Pt will increase FOTO from 46 to >/= 70 for increased confidence in abilities.    Baseline 46 05/21/20    Time  8    Period Weeks    Status New                 Plan - 06/21/20 1032    Clinical Impression Statement Pt brought in walking pole today - ambulated clinic distances and outdoors with supervision/min guard. Also trialed foot up brace for improved R foot clearance, pt demonstrating improved foot clearance and heel > toe pattern, pt reporting that it initially felt weird and he felt off balance, but reports he got used to it towards the end of session. Will continue to trial in PT sessions.    Personal Factors and Comorbidities Comorbidity 3+;Age    Comorbidities MS, CHB s/p PPM, HOCM    Examination-Activity Limitations Squat;Locomotion Level;Transfers;Stairs;Stand    Examination-Participation Restrictions Yard Work;Occupation;Driving;Community  Activity    Stability/Clinical Decision Making Evolving/Moderate complexity    Rehab Potential Good    PT Frequency 2x / week    PT Duration 8 weeks    PT Treatment/Interventions Gait training;Stair training;Functional mobility training;Therapeutic activities;Therapeutic exercise;Balance training;Neuromuscular re-education;Patient/family education;Orthotic Fit/Training;DME Instruction;ADLs/Self Care Home Management;Energy conservation;Manual techniques;Vestibular    PT Next Visit Plan gait training with SPC with quad tip/walking stick (scanning, obstacles, over unlevel surfaces). sit <> stands.  corner balance exercises. RLE strengthening (esp hip flexors), can continue to trial foot up brace    Consulted and Agree with Plan of Care Patient;Family member/caregiver    Family Member Consulted Wife           Patient will benefit from skilled therapeutic intervention in order to improve the following deficits and impairments:  Abnormal gait,Decreased coordination,Difficulty walking,Decreased endurance,Decreased activity tolerance,Impaired perceived functional ability,Decreased balance,Decreased mobility,Decreased strength,Improper body mechanics  Visit Diagnosis: Other lack of coordination  Muscle weakness (generalized)  Other abnormalities of gait and mobility     Problem List Patient Active Problem List   Diagnosis Date Noted  . Stroke ([place]) 05/16/2020  . Acute focal neurological deficit 05/15/2020  . BPH (benign prostatic hyperplasia) 02/02/2016  . Fractured atrial pacemaker lead wire 07/18/2015  . 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 Chest pain 08/02/2013  . SOB (shortness of breath) 08/02/2013  . PVC (premature ventricular contraction) 02/13/2013  . Chest pain on exertion 02/25/2012  . Pacemaker-dual-chamber Medtronic 04/29/2011  . Change in bowel habits 01/22/2011  . Weight loss 01/22/2011  . COUGH 09/01/2010  . Multiple sclerosis ([place])   . HOCM (hypertrophic obstructive  cardiomyopathy) ([place])   . Right bundle branch block   . Complete heart block ([place]) 12/05/2007    Arliss Journey, PT, DPT  06/21/2020, 10:45 AM  [facility] [address] [place], [place], [address] Phone: [phone]   Fax:  [phone]  Name: Rodney Adkins MRN: [id number] Date of Birth: March 17, 1956

## 2020-06-21 NOTE — Therapy (Signed)
[facility] [address] [place], [place], [address] Phone: [phone]   Fax:  [phone]  Speech Language Pathology Treatment  Patient Details  Name: Rodney Adkins MRN: [id number] Date of Birth: Apr 29, 1956 Referring Provider (SLP): Darliss Cheney,  MD   Encounter Date: 06/21/2020   End of Session - 06/21/20 1232    Visit Number 3    Number of Visits 17    Date for SLP Re-Evaluation 09/10/20    SLP Start Time 1109    SLP Stop Time  1149    SLP Time Calculation (min) 40 min    Activity Tolerance Patient tolerated treatment well           Past Medical History:  Diagnosis Date  . Complete heart block [facility]) June 2009   a. Presented with hypotension/pulm edema 12/2007 when HOCM was also discovered. s/p dual chamber MDT pacemaker. Thallium stest 7/09 negative for infiltrative disease or ischemia.  Followed Dr [name]  Marland Kitchen Heart murmur   . HOCM (hypertrophic obstructive cardiomyopathy) ([place]) 2009   Discovered by ECHO when he developed CHB. Mgmt Dr Caryl Comes  and Dr [name] at [facility]. Has outflow obstruction on ECHO 2011  . Multiple sclerosis ([place]) 1998   Sxs started 1998 with L body numbness. MRI c/w MS. Started Avonex. Saw Dr [name] at [facility] and switched to Betaseron and was on for 5 yrs but developed depression and site rxns and D/C'd 2006. While on Betaseron, occassionally required solumedrol for flares.  . Pacemaker 2017  . Prostate enlargement   . Right bundle branch block   . SOB (shortness of breath)    a. PFTs 09/2010 essentially normal per pulm note (mild obst defect). b. 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  . 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 Pacer reprogrammed with improvement in sx.    Past Surgical History:  Procedure Laterality Date  . LEFT  HEART CATHETERIZATION WITH CORONARY ANGIOGRAM N/A 08/22/2013   Procedure: LEFT HEART CATHETERIZATION WITH CORONARY ANGIOGRAM;  Surgeon: [name], MD;  Location: [facility] CATH LAB;  Service: Cardiovascular;  Laterality: N/A;  . LEG TENDON SURGERY  2008   Os peroneus resection and peroneal tendon repair  . pace Agricultural engineer    . PACEMAKER INSERTION  12/2007   Complete heart block  . PACEMAKER LEAD REMOVAL Left 07/18/2015   Procedure: ATRIAL PACEMAKER LEAD EXTRACTION; ATRIAL PACEMAKER LEAD INSERTION; GENERATOR REPLACEMENT.;  Surgeon: [name], MD;  Location: [place];  Service: Cardiovascular;  Laterality: Left;  [name] to back up  . PERIPHERAL VASCULAR CATHETERIZATION N/A 02/12/2015   Procedure: Venogram;  Surgeon: [name], MD;  Location: [facility] CV LAB;  Service: Cardiovascular;  Laterality: N/A;  . TEE WITHOUT CARDIOVERSION N/A 07/18/2015   Procedure: TRANSESOPHAGEAL ECHOCARDIOGRAM (TEE);  Surgeon: [name], MD;  Location: [facility] OR;  Service: Cardiovascular;  Laterality: N/A;    There were no vitals filed for this visit.   Subjective Assessment - 06/21/20 1127    Subjective "Pt arrives having been practicing PHoRTE since Wednesday.    Currently in Pain? No/denies                 ADULT SLP TREATMENT - 06/21/20 0001      General Information   Behavior/Cognition Alert;Cooperative;Pleasant mood      Treatment  Provided   Treatment provided Cognitive-Linquistic      Cognitive-Linquistic Treatment   Treatment focused on Dysarthria;Patient/family/caregiver education    Skilled Treatment Rodney Adkins waited patiently while SLP finished with previous patient. He has been faithful in practicing PHOrTE since last session. Pt with noted intermittent arresting vocal quality with /a/ sustained - had him try initial /h/ on these productions. This improved his voice in that arresting quality was not as frequent, but it was still present. Pitch glides were exceptionally difficult for pt to end  at same pitch he began with and pt did not always achieve a lower pitch to begin with. Rodney Adkins recited sentences in a exceptionally higher pitch than a high-pitched speaking voice so SLP corrected his pitchlower but still maintaining a higher than his WNL pitch. Pt's lower authoritative tone was adequate for the exericse. Rodney Adkins needed SLP min-mod cues occasionally for abdominal breathing (AB).      Assessment / Recommendations / Plan   Plan Continue with current plan of care      Progression Toward Goals   Progression toward goals Progressing toward goals            SLP Education - 06/21/20 1231    Education Details PHorTE procedure, abdominal breathing necessary for all PHOrTE exercises,    Person(s) Educated Patient    Methods Explanation;Demonstration;Verbal cues    Comprehension Verbalized understanding;Returned demonstration;Verbal cues required;Need further instruction            SLP Short Term Goals - 06/21/20 1234      SLP SHORT TERM GOAL #1   Title pt will undergo objective swallow assessment    Time 2    Period Weeks    Status On-going      SLP SHORT TERM GOAL #2   Title pt will demonstrate dysarthria HEP with rare min A over 3 sessions    Time 4    Period Weeks   or 9 sessions, for all STGs   Status On-going      SLP SHORT TERM GOAL #3   Title pt will demonstrage voice HEP with rare min A over 3 sessions    Time 4    Period Weeks    Status New      SLP SHORT TERM GOAL #4   Title pt will engage in 8 mintues conversation maintaining gentle voice in 3 sesions, to limit possibility of habitualizing muscle tension dysphonia    Time 4    Period Weeks    Status On-going      SLP SHORT TERM GOAL #5   Title pt will demonstrate speech compensations in sentence responses 80% of the time, in 3 sesssions    Time 4    Period Weeks    Status On-going            SLP Long Term Goals - 06/21/20 1235      SLP LONG TERM GOAL #1   Title pt will demonstrate voice HEP  with modified indpendnece in 3 sessions    Time 8    Period Weeks   or 17 sessions, for all LTGs   Status On-going      SLP LONG TERM GOAL #2   Title pt will engage in 12 mintues conversation maintaining gentle voice 80% of the time, to limit possibility of habitualizing muscle tension dysphonia    Time 8    Period Weeks    Status On-going      SLP LONG TERM GOAL #3  Title pt will engage in 12 mintues mod complex conversation using speech strategies 80% of the time, in 3 sessions    Time 8    Period Weeks    Status On-going      SLP LONG TERM GOAL #4   Title pt will demonstrate improved score on Voice-related quality of life (QOL) measure (indicating better QOL) from therapy session #1    Time 8    Period Weeks    Status On-going            Plan - 06/21/20 1232    Clinical Impression Statement Rodney "Marden Noble" presents today with mild flaccid dysarthria, and 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 Today PHoRTE program was reviewed and corrected, as well as a general swallowing exercise. A modified barium swallow exam is recommended as pt's swallow ability has not improved dramatically over the last 4 weeks. Skilled ST is necessary to improve pt's swallowing, speech, adn voice quality. If pt's voice does not show improvement a referral to ENT may be necessary.    Speech Therapy Frequency 2x / week    Duration 8 weeks   17 visits   Treatment/Interventions Aspiration precaution training;Pharyngeal strengthening exercises;Diet toleration management by SLP;Trials of upgraded texture/liquids;Cueing hierarchy;SLP instruction and feedback;Functional tasks;Oral motor exercises;Compensatory strategies;Patient/family education;Internal/external aids;Other (comment)           Patient will benefit from skilled therapeutic intervention in order to improve the following deficits and impairments:   Dysarthria and anarthria  Dysphagia, unspecified  type  Voice hoarseness    Problem List Patient Active Problem List   Diagnosis Date Noted  . Stroke ([place]) 05/16/2020  . Acute focal neurological deficit 05/15/2020  . BPH (benign prostatic hyperplasia) 02/02/2016  . Fractured atrial pacemaker lead wire 07/18/2015  . 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 Chest pain 08/02/2013  . SOB (shortness of breath) 08/02/2013  . PVC (premature ventricular contraction) 02/13/2013  . Chest pain on exertion 02/25/2012  . Pacemaker-dual-chamber Medtronic 04/29/2011  . Change in bowel habits 01/22/2011  . Weight loss 01/22/2011  . COUGH 09/01/2010  . Multiple sclerosis ([place])   . HOCM (hypertrophic obstructive cardiomyopathy) ([place])   . Right bundle branch block   . Complete heart block ([place]) 12/05/2007    [place] ,[place], [place]  06/21/2020, 12:36 PM  [facility] [address] [place], [place], [address] Phone: [phone]   Fax:  [phone]   Name: Rodney Adkins MRN: [id number] Date of Birth: 1955-11-09

## 2020-06-21 NOTE — Therapy (Signed)
[facility] [address] [place], [place], [address] Phone: [phone]   Fax:  [phone]  Occupational Therapy Treatment  Patient Details  Name: Rodney Adkins MRN: [id number] Date of Birth: 02-08-1956 Referring Provider (OT): Darliss Cheney, MD   Encounter Date: 06/21/2020   OT End of Session - 06/21/20 1015    Visit Number 6    Number of Visits 17    Date for OT Re-Evaluation 07/19/20    Authorization Type State BCBS    Authorization Time Period VL:MN    OT Start Time 1015    OT Stop Time 1100    OT Time Calculation (min) 45 min    Activity Tolerance Patient tolerated treatment well    Behavior During Therapy [facility] for tasks assessed/performed           Past Medical History:  Diagnosis Date  . Complete heart block [facility]) June 2009   a. Presented with hypotension/pulm edema 12/2007 when HOCM was also discovered. s/p dual chamber MDT pacemaker. Thallium stest 7/09 negative for infiltrative disease or ischemia.  Followed Dr [name]  Marland Kitchen Heart murmur   . HOCM (hypertrophic obstructive cardiomyopathy) ([place]) 2009   Discovered by ECHO when he developed CHB. Mgmt Dr Caryl Comes  and Dr [name] at [facility] Has outflow obstruction on ECHO 2011  . Multiple sclerosis ([place]) 1998   Sxs started 1998 with L body numbness. MRI c/w MS. Started Avonex. Saw Dr [name] at [facility] and switched to Betaseron and was on for 5 yrs but developed depression and site rxns and D/C'd 2006. While on Betaseron, occassionally required solumedrol for flares.  . Pacemaker 2017  . Prostate enlargement   . Right bundle branch block   . SOB (shortness of breath)    a. PFTs 09/2010 essentially normal per pulm note (mild obst defect). b. 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  . Wenckebach    a. Decreased ex tolerance 2010/2012 - underwent stress echo to look @ effects of MV/LVOT - nothing noted; although pt had high rate Wenckebach behavior with associated  ex intol. Pacer reprogrammed with improvement in sx.    Past Surgical History:  Procedure Laterality Date  . LEFT HEART CATHETERIZATION WITH CORONARY ANGIOGRAM N/A 08/22/2013   Procedure: LEFT HEART CATHETERIZATION WITH CORONARY ANGIOGRAM;  Surgeon: [name], MD;  Location: [facility] CATH LAB;  Service: Cardiovascular;  Laterality: N/A;  . LEG TENDON SURGERY  2008   Os peroneus resection and peroneal tendon repair  . pace Agricultural engineer    . PACEMAKER INSERTION  12/2007   Complete heart block  . PACEMAKER LEAD REMOVAL Left 07/18/2015   Procedure: ATRIAL PACEMAKER LEAD EXTRACTION; ATRIAL PACEMAKER LEAD INSERTION; GENERATOR REPLACEMENT.;  Surgeon: [name], MD;  Location: [place] [facility];  Service: Cardiovascular;  Laterality: Left;  [name] to back up  . PERIPHERAL VASCULAR CATHETERIZATION N/A 02/12/2015   Procedure: Venogram;  Surgeon: [name], MD;  Location: [place] CV LAB;  Service: Cardiovascular;  Laterality: N/A;  . TEE WITHOUT CARDIOVERSION N/A 07/18/2015   Procedure: TRANSESOPHAGEAL ECHOCARDIOGRAM (TEE);  Surgeon: [name], MD;  Location: [facility] OR;  Service: Cardiovascular;  Laterality: N/A;    There were no vitals filed for this visit.   Subjective Assessment - 06/21/20 1016    Subjective  Pt denies any pain. "OT is harder"    Pertinent History Fall Risk. Speech Deficits.    Limitations MS, CHB s/p PPM, HOCM    Patient Stated Goals right  hand. Getting hand working better with typing, working mouse on computer, Administrator, [employer]. Return to driving recommendations.    Currently in Pain? No/denies                        OT Treatments/Exercises (OP) - 06/21/20 1019      ADLs   Cooking fried an egg with supervision this day. Pt ambulated around kitchen with some imbalance with gait but was able to get down on knee to get frying pan and get back up with no additional assistance .Pt remembered to turn stove off and utilized bilateral hands for cracking egg and flipping egg       Fine Motor Coordination (Hand/Wrist)   Fine Motor Coordination Grooved pegs;In hand manipuation training;Manipulation of small objects    In Hand Manipulation Training RUE 3 pegs in palm and placing in grooved pegboard with translating one at a time to fingertips with min drops and increased time    Manipulation of small objects stringing pegs with BUE    Handwriting using pen with coban RUE - word finding and writing animals for every letter this day with emphasis on print and legibility of letters and size. Pt completed writing on mirror on vertical surface with focus on letter size and big smooth strokes for letters. Pt with increased letter size when given visual guides with moderate effort    Grooved pegs used RUE for grooved pegs. Min drops and difficulty wiht rotating pegs in hand                    OT Short Term Goals - 06/19/20 1058      OT SHORT TERM GOAL #1   Title Pt will be independent with HEP 06/21/20    Time 4    Period Weeks    Status Achieved    Target Date 06/21/20      OT SHORT TERM GOAL #2   Title Pt will demonstrate improved fine motor coordination by demonstrating decrease in 9 hole peg test score by 6 seconds with RUE.    Baseline RUE 71.06    Time 4    Period Weeks    Status On-going      OT SHORT TERM GOAL #3   Title Pt will perform simple warm meal prep or home management task with supervision and good safety awareness.    Time 4    Period Weeks    Status New      OT SHORT TERM GOAL #4   Title Pt will write 2-3 sentences with 75% legibility and increased size of letters (mild micrographia).    Baseline severe micrographia    Time 4    Period Weeks    Status On-going      OT SHORT TERM GOAL #5   Title Pt will demonstrate increase in grip strength by 5 lbs in RUE for increased ease with ADLs and clothing management, etc.    Baseline RUE 45.4 (LUE 65.6)    Time 4    Period Weeks    Status On-going      OT SHORT TERM GOAL #6   Title  Pt will report using RUE (dominant hand) for 25% or more of functional tasks.    Time 4    Period Weeks    Status On-going             OT Long Term Goals - 05/24/20 1651      OT LONG  TERM GOAL #1   Title Pt will be independent with updated HEP 07/19/2020    Time 8    Period Weeks    Status New    Target Date 07/19/20      OT LONG TERM GOAL #2   Title Pt will demonstrate improved fine motor coordination by demonstrating decrease in 9 hole peg test score by 15 seconds with RUE.    Time 8    Period Weeks    Status New      OT LONG TERM GOAL #3   Title Pt will perform work simulated tasks (typing, using mouse, etc) with RUE as dominant hand with 90% accuracy.    Time 8    Period Weeks    Status New      OT LONG TERM GOAL #4   Title Pt will report completing at least 1 home management task per day with mod I in order to decrease caregiver burden.    Time 8    Period Weeks    Status New      OT LONG TERM GOAL #5   Title Pt will improve grip strength by at least 10 lbs in RUE for strengthening dominant hand for functional use and tasks.    Baseline RUE 45.4 (LUE 65.6)    Time 8    Period Weeks    Status New      OT LONG TERM GOAL #6   Title Pt will report using RUE (dominant hand) for at least 75% of functional tasks per day.    Time 8    Period Weeks    Status New      OT LONG TERM GOAL #7   Title Pt will perform physical and cognitive tasks simultaneously with 95% accuracy for returning to prior tasks.    Time 8    Period Weeks    Status New      OT LONG TERM GOAL #8   Title Pt will write 3-5 sentences with 90% legibility and appropriate sizing.    Time 8    Period Weeks    Status New                 Plan - 06/21/20 1326    Clinical Impression Statement Pt motivated for progression and improving coordination for handwriting and other ADLs and IADLs    OT Occupational Profile and History Detailed Assessment- Review of Records and additional review of  physical, cognitive, psychosocial history related to current functional performance    Occupational performance deficits (Please refer to evaluation for details): IADL's;ADL's;Social Participation;Leisure;Work;Rest and Sleep    Body Structure / Function / Physical Skills ADL;Dexterity;Decreased knowledge of use of DME;Strength;UE functional use;Endurance;IADL;Coordination;FMC    Rehab Potential Good    Clinical Decision Making Limited treatment options, no task modification necessary    Comorbidities Affecting Occupational Performance: None    Modification or Assistance to Complete Evaluation  No modification of tasks or assist necessary to complete eval    OT Frequency 2x / week    OT Duration 8 weeks   or 16 visits (+ eval) over extended time   OT Treatment/Interventions Self-care/ADL training;DME and/or AE instruction;Gait Training;Therapeutic activities;Balance training;Cognitive remediation/compensation;Therapeutic exercise;Neuromuscular education;Passive range of motion;Visual/perceptual remediation/compensation;Patient/family education;Energy conservation    Plan continue coordination activities for [place] coordination HEP    Consulted and Agree with Plan of Care Patient;Family member/caregiver   spouse   Family Member Consulted spouse  Patient will benefit from skilled therapeutic intervention in order to improve the following deficits and impairments:   Body Structure / Function / Physical Skills: ADL,Dexterity,Decreased knowledge of use of DME,Strength,UE functional use,Endurance,IADL,Coordination,FMC       Visit Diagnosis: Other lack of coordination  Muscle weakness (generalized)  Attention and concentration deficit    Problem List Patient Active Problem List   Diagnosis Date Noted  . Stroke ([place]) 05/16/2020  . Acute focal neurological deficit 05/15/2020  . BPH (benign prostatic hyperplasia) 02/02/2016  . Fractured atrial pacemaker  lead wire 07/18/2015  . 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 Chest pain 08/02/2013  . SOB (shortness of breath) 08/02/2013  . PVC (premature ventricular contraction) 02/13/2013  . Chest pain on exertion 02/25/2012  . Pacemaker-dual-chamber Medtronic 04/29/2011  . Change in bowel habits 01/22/2011  . Weight loss 01/22/2011  . COUGH 09/01/2010  . Multiple sclerosis ([place])   . HOCM (hypertrophic obstructive cardiomyopathy) ([place])   . Right bundle branch block   . Complete heart block ([place]) 12/05/2007    Zachery Conch MOT, OTR/L  06/21/2020, 1:27 PM  [facility] [address] [place], [place], [address] Phone: [phone]   Fax:  [phone]  Name: Rodney Adkins MRN: [id number] Date of Birth: 11/15/1955

## 2020-06-25 ENCOUNTER — Ambulatory Visit: Payer: BC Managed Care – PPO | Admitting: Occupational Therapy

## 2020-06-25 ENCOUNTER — Ambulatory Visit: Payer: BC Managed Care – PPO

## 2020-06-25 ENCOUNTER — Encounter: Payer: Self-pay | Admitting: Occupational Therapy

## 2020-06-25 ENCOUNTER — Other Ambulatory Visit: Payer: Self-pay

## 2020-06-25 ENCOUNTER — Ambulatory Visit: Payer: BC Managed Care – PPO | Admitting: Physical Therapy

## 2020-06-25 ENCOUNTER — Encounter: Payer: Self-pay | Admitting: Physical Therapy

## 2020-06-25 DIAGNOSIS — R41840 Attention and concentration deficit: Secondary | ICD-10-CM

## 2020-06-25 DIAGNOSIS — R490 Dysphonia: Secondary | ICD-10-CM

## 2020-06-25 DIAGNOSIS — R471 Dysarthria and anarthria: Secondary | ICD-10-CM

## 2020-06-25 DIAGNOSIS — R278 Other lack of coordination: Secondary | ICD-10-CM

## 2020-06-25 DIAGNOSIS — I69351 Hemiplegia and hemiparesis following cerebral infarction affecting right dominant side: Secondary | ICD-10-CM

## 2020-06-25 DIAGNOSIS — M6281 Muscle weakness (generalized): Secondary | ICD-10-CM

## 2020-06-25 DIAGNOSIS — R2689 Other abnormalities of gait and mobility: Secondary | ICD-10-CM | POA: Diagnosis not present

## 2020-06-25 DIAGNOSIS — R1310 Dysphagia, unspecified: Secondary | ICD-10-CM

## 2020-06-25 NOTE — Therapy (Signed)
[facility] [address] [place], [place], [address] Phone: [phone]   Fax:  [phone]  Physical Therapy Treatment  Patient Details  Name: Rodney Adkins MRN: [id number] Date of Birth: [birth year] Referring Provider (PT): Otto Herb, MD   Encounter Date: 06/25/2020   PT End of Session - 06/25/20 1609    Visit Number 10    Number of Visits 17    Authorization Type BCBS PPO - FOTO patient    PT Start Time 1103    PT Stop Time 4970    PT Time Calculation (min) 42 min    Equipment Utilized During Treatment Gait belt    Activity Tolerance Patient tolerated treatment well    Behavior During Therapy [facility] for tasks assessed/performed           Past Medical History:  Diagnosis Date   Complete heart block [facility]) June 2009   a. Presented with hypotension/pulm edema 12/2007 when HOCM was also discovered. s/p dual chamber MDT pacemaker. Thallium stest 7/09 negative for infiltrative disease or ischemia.  Followed Dr [name]   Heart murmur    HOCM (hypertrophic obstructive cardiomyopathy) ([place]) 2009   Discovered by ECHO when he developed CHB. Mgmt Dr Caryl Comes  and Dr [name] at [facility]. Has outflow obstruction on ECHO 2011   Multiple sclerosis ([place]) 1998   Sxs started 1998 with L body numbness. MRI c/w MS. Started Avonex. Saw Dr [name] at [facility] and switched to Betaseron and was on for 5 yrs but developed depression and site rxns and D/C'd 2006. While on Betaseron, occassionally required solumedrol for flares.   Pacemaker 2017   Prostate enlargement    Right bundle branch block    SOB (shortness of breath)    a. PFTs 09/2010 essentially normal per pulm note (mild obst defect). b. 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  Pacer reprogrammed with improvement in sx.    Past Surgical History:  Procedure Laterality Date   LEFT HEART CATHETERIZATION WITH CORONARY ANGIOGRAM N/A 08/22/2013   Procedure: LEFT HEART CATHETERIZATION WITH CORONARY ANGIOGRAM;  Surgeon: [name], MD;  Location: [facility] CATH LAB;  Service: Cardiovascular;  Laterality: N/A;   LEG TENDON SURGERY  2008   Os peroneus resection and peroneal tendon repair   pace maker     PACEMAKER INSERTION  12/2007   Complete heart block   PACEMAKER LEAD REMOVAL Left 07/18/2015   Procedure: ATRIAL PACEMAKER LEAD EXTRACTION; ATRIAL PACEMAKER LEAD INSERTION; GENERATOR REPLACEMENT.;  Surgeon: [name], MD;  Location: [facility];  Service: Cardiovascular;  Laterality: Left;  [name] to back up   PERIPHERAL VASCULAR CATHETERIZATION N/A 02/12/2015   Procedure: Venogram;  Surgeon: [name], MD;  Location: [place] CV LAB;  Service: Cardiovascular;  Laterality: N/A;   TEE WITHOUT CARDIOVERSION N/A 07/18/2015   Procedure: TRANSESOPHAGEAL ECHOCARDIOGRAM (TEE);  Surgeon: [name], MD;  Location: [facility] OR;  Service: Cardiovascular;  Laterality: N/A;    There were no vitals filed for this visit.   Subjective Assessment - 06/25/20 1104    Subjective No changes, no falls. Exercises are going well at home. Has been doing some calf stretching.    Patient is accompained by: Family member    Pertinent History MS, CHB s/p PPM, HOCM  Limitations Walking;Writing;Standing;Lifting;House hold activities    Patient Stated Goals wants to be able to walk better.    Currently in Pain? No/denies                             [facility] Adult PT Treatment/Exercise - 06/25/20 1131      Ambulation/Gait   Ambulation/Gait Yes    Ambulation/Gait Assistance 5: Supervision    Ambulation/Gait Assistance Details donned R foot up brace throughout session today for incr foot clearance, pt reporting it felt better today, will practice again at next session and  provide info to purchase esp when pt is fatigued to help with RLE foot clearance    Ambulation Distance (Feet) --   clinic distances   Assistive device Other (Comment)   single walking stick   Gait Pattern Step-through pattern;Decreased hip/knee flexion - right;Decreased step length - right    Ambulation Surface Level;Indoor      Exercises   Exercises Other Exercises;Knee/Hip    Other Exercises  Pt reporting incr R toe curling at times - discussed with pt stretching out toes (with interlacing fingers and stretching them out) prior to getting out of bed - pt able to do so in sitting with bringing R leg in figure 4 position and states he used to do this in yoga , standing calf stretch at countertop with RLE posteriorly 3 x 30 seconds (pt reports doing this at home but instructed to hold for 30 seconds), modified up dog/down dog with BUE support  at countertop x5 reps with holding position for additional stretch      Knee/Hip Exercises: Standing   Knee Flexion Strengthening;Right;2 sets;10 reps    Knee Flexion Limitations 2# weight    Hip Flexion Stengthening;AROM;Both;2 sets;10 reps    Hip Flexion Limitations with 2# weight on B ankles, alternating marching    Hip Abduction Stengthening;Both;2 sets;10 reps    Abduction Limitations verbal and demo cues for proper technique, cues to keep a soft knee in R leg to prevent from hyperextending      Knee/Hip Exercises: Seated   Long Arc Quad Strengthening;AROM;Right;2 sets;10 reps    Long Arc Quad Weight 2 lbs.               Balance Exercises - 06/25/20 0001      Balance Exercises: Standing   Standing Eyes Opened Narrow base of support (BOS);Limitations    Standing Eyes Opened Limitations on blue foam beam, x10 reps head nods, x10 reps head turns, intermittent min A for blanace   Stepping Strategy Posterior;Foam/compliant surface;UE support;Limitations    Stepping Strategy Limitations x10 reps stepping RLE on and off blue air ex for incr  foot clearance, needing UE support    Sidestepping Foam/compliant support;Upper extremity support;3 reps;Limitations    Sidestepping Limitations down and back on blue foam beam, cues for incr foot clearance, intermittent UE support             PT Education - 06/25/20 1609    Education Details potentially adding PT on thursday at 2 (did not have PT scheduled) - pt stating that he would call back if he could make that work for his/ride's schedule    Person(s) Educated Patient    Methods Explanation    Comprehension Verbalized understanding            PT Short Term Goals - 06/19/20 0852      PT SHORT TERM GOAL #1  Title Pt will be independent with initial HEP.    Baseline Pt reports that he has been working on the exercises.    Time 4    Period Weeks    Status Achieved    Target Date 06/18/20      PT SHORT TERM GOAL #2   Title Pt will decrease 5xSTS to </= 15 seconds for decreased fall risk.    Baseline 22 s without UE support from standard arm chair 05/21/20. 06/19/20 15.79 sec from chair with hands and 17.37 sec without hands    Time 4    Period Weeks    Status Not Met    Target Date 06/18/20      PT SHORT TERM GOAL #3   Title Pt will increase gait speed to >/= 0.75 m/s for increased access to community and decreased fall risk.    Baseline 0.65 m/s with RW 05/21/20. 0.34ms with walker and 0.714m with cane    Time 4    Period Weeks    Status Achieved    Target Date 06/18/20      PT SHORT TERM GOAL #4   Title Pt will increase Berg score by 4 points in order to decrease fall risk and increase static and dynamic balance by a clincal detectable change (4 points = CDC).    Baseline 45/56 ind mod fall risk 05/21/20. 06/19/20 Berg=51/56    Time 4    Period Weeks    Status Achieved    Target Date 06/18/20             PT Long Term Goals - 06/19/20 0940      PT LONG TERM GOAL #1   Title Pt will be independent with progressive HEP.    Baseline NA    Time 8     Period Weeks    Status New      PT LONG TERM GOAL #2   Title Pt will decrease 5xSTS to </= 12 seconds to no longer be classified as fall risk and to be WNL for age and gender.    Baseline 22 s from standard arm chair without UE support. 06/19/20 17.37 sec    Time 8    Period Weeks    Status New      PT LONG TERM GOAL #3   Title Pt will reach >52/56 on Berg balance score for improved static and dynamic balance and decreased risk for falls.    Baseline 45/56 (mod fall risk) 05/21/20. 06/19/20 51/56    Time 8    Period Weeks    Status Revised      PT LONG TERM GOAL #4   Title Pt will reach 1.0 m/s gait speed for community ambulation and decreased fall risk.    Baseline 0.65 m/s. 06/19/20 0.9855mwith walker and 0.47m62mith cane    Time 8    Period Weeks    Status New      PT LONG TERM GOAL #5   Title Pt will ambulated 800+ feet on outdoor/unlevel surfaces with supervision and LRAD.    Baseline NT    Time 8    Period Weeks    Status New      PT LONG TERM GOAL #6   Title Pt will increase FOTO from 46 to >/= 70 for increased confidence in abilities.    Baseline 46 05/21/20    Time 8    Period Weeks    Status New  Plan - 06/25/20 1715    Clinical Impression Statement Focus of today's skilled session was R>LLE strengthening and balance strategies on compliant surfaces. Pt tolerated session well, needed intermittent seated rest breaks. Pt reported feeling better and more used to R foot up brace during activities and gait today - will use again at next session and then provide info on where to purchase. Will continue to progress towrads LTGs.    Personal Factors and Comorbidities Comorbidity 3+;Age    Comorbidities MS, CHB s/p PPM, HOCM    Examination-Activity Limitations Squat;Locomotion Level;Transfers;Stairs;Stand    Examination-Participation Restrictions Yard Work;Occupation;Driving;Community Activity    Stability/Clinical Decision Making  Evolving/Moderate complexity    Rehab Potential Good    PT Frequency 2x / week    PT Duration 8 weeks    PT Treatment/Interventions Gait training;Stair training;Functional mobility training;Therapeutic activities;Therapeutic exercise;Balance training;Neuromuscular re-education;Patient/family education;Orthotic Fit/Training;DME Instruction;ADLs/Self Care Home Management;Energy conservation;Manual techniques;Vestibular    PT Next Visit Plan gait training with SPC with quad tip/walking stick (scanning, obstacles, over unlevel surfaces). corner balance exercises and on compliant surfaces. RLE strengthening, continue to trial R foot up brace and provide pt info on how/where to purchase    Consulted and Agree with Plan of Care Patient;Family member/caregiver    Family Member Consulted Wife           Patient will benefit from skilled therapeutic intervention in order to improve the following deficits and impairments:  Abnormal gait,Decreased coordination,Difficulty walking,Decreased endurance,Decreased activity tolerance,Impaired perceived functional ability,Decreased balance,Decreased mobility,Decreased strength,Improper body mechanics  Visit Diagnosis: Other lack of coordination  Muscle weakness (generalized)  Hemiplegia and hemiparesis following cerebral infarction affecting right dominant side ([place])  Other abnormalities of gait and mobility     Problem List Patient Active Problem List   Diagnosis Date Noted   Stroke ([place]) 05/16/2020   Acute focal neurological deficit 05/15/2020   BPH (benign prostatic hyperplasia) 02/02/2016   Fractured atrial pacemaker lead wire 07/18/2015   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   Chest pain 08/02/2013   SOB (shortness of breath) 08/02/2013   PVC (premature ventricular contraction) 02/13/2013   Chest pain on exertion 02/25/2012   Pacemaker-dual-chamber Medtronic 04/29/2011   Change in bowel habits 01/22/2011   Weight loss 01/22/2011    COUGH 09/01/2010   Multiple sclerosis ([place])    HOCM (hypertrophic obstructive cardiomyopathy) ([place])    Right bundle branch block    Complete heart block ([place]) 12/05/2007    Arliss Journey, PT, DPT  06/25/2020, 5:17 PM  [facility] [address] [place], [place], [address] Phone: [phone]   Fax:  [phone]  Name: CONNAR KEATING MRN: [id number] Date of Birth: [birth year]

## 2020-06-25 NOTE — Therapy (Signed)
[facility] [address] [place], [place], [address] Phone: [phone]   Fax:  [phone]  Occupational Therapy Treatment  Patient Details  Name: Rodney Adkins MRN: [id number] Date of Birth: January 26, 1956 Referring Provider (OT): Darliss Cheney, MD   Encounter Date: 06/25/2020   OT End of Session - 06/25/20 1017    Visit Number 7    Number of Visits 17    Date for OT Re-Evaluation 07/19/20    Authorization Type State BCBS    Authorization Time Period VL:MN Week 4 of 8 (12/21)    OT Start Time 1017    OT Stop Time 1057    OT Time Calculation (min) 40 min    Activity Tolerance Patient tolerated treatment well    Behavior During Therapy [facility] for tasks assessed/performed           Past Medical History:  Diagnosis Date  . Complete heart block [facility]) June 2009   a. Presented with hypotension/pulm edema 12/2007 when HOCM was also discovered. s/p dual chamber MDT pacemaker. Thallium stest 7/09 negative for infiltrative disease or ischemia.  Followed Dr [name]  Marland Kitchen Heart murmur   . HOCM (hypertrophic obstructive cardiomyopathy) ([place]) 2009   Discovered by ECHO when he developed CHB. Mgmt Dr Caryl Comes  and Dr [name] at [facility]. Has outflow obstruction on ECHO 2011  . Multiple sclerosis ([place]) 1998   Sxs started 1998 with L body numbness. MRI c/w MS. Started Avonex. Saw Dr [name] at [facility] and switched to Betaseron and was on for 5 yrs but developed depression and site rxns and D/C'd 2006. While on Betaseron, occassionally required solumedrol for flares.  . Pacemaker 2017  . Prostate enlargement   . Right bundle branch block   . SOB (shortness of breath)    a. PFTs 09/2010 essentially normal per pulm note (mild obst defect). b. 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  . Wenckebach    a. Decreased ex tolerance 2010/2012 - underwent stress echo to look @ effects of MV/LVOT - nothing noted; although pt had high rate Wenckebach  behavior with associated ex intol. Pacer reprogrammed with improvement in sx.    Past Surgical History:  Procedure Laterality Date  . LEFT HEART CATHETERIZATION WITH CORONARY ANGIOGRAM N/A 08/22/2013   Procedure: LEFT HEART CATHETERIZATION WITH CORONARY ANGIOGRAM;  Surgeon: [name], MD;  Location: [facility] C[facility] LAB;  Service: Cardiovascular;  Laterality: N/A;  . LEG TENDON SURGERY  2008   Os peroneus resection and peroneal tendon repair  . pace Agricultural engineer    . PACEMAKER INSERTION  12/2007   Complete heart block  . PACEMAKER LEAD REMOVAL Left 07/18/2015   Procedure: ATRIAL PACEMAKER LEAD EXTRACTION; ATRIAL PACEMAKER LEAD INSERTION; GENERATOR REPLACEMENT.;  Surgeon: [name], MD;  Location: [facility];  Service: Cardiovascular;  Laterality: Left;  [name] to back up  . PERIPHERAL VASCULAR CATHETERIZATION N/A 02/12/2015   Procedure: Venogram;  Surgeon: [name], MD;  Location: [facility] CV LAB;  Service: Cardiovascular;  Laterality: N/A;  . TEE WITHOUT CARDIOVERSION N/A 07/18/2015   Procedure: TRANSESOPHAGEAL ECHOCARDIOGRAM (TEE);  Surgeon: [name], MD;  Location: [facility] OR;  Service: Cardiovascular;  Laterality: N/A;    There were no vitals filed for this visit.   Subjective Assessment - 06/25/20 1017    Subjective  Pt denies any pain.    Pertinent History Fall Risk. Speech Deficits.    Limitations MS, CHB s/p PPM, HOCM    Patient Stated  Goals right hand. 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 Return to driving recommendations.    Currently in Pain? No/denies                        OT Treatments/Exercises (OP) - 06/25/20 1019      Hand Exercises   Other Hand Exercises Hand Gripper level 2 with RUE and picking up 1 inch blocks - mod drops    Other Hand Exercises resistance clothespins with RUE 1-8# and placed on elevated surface of antenna      Fine Motor Coordination (Hand/Wrist)   Fine Motor Coordination Manipulation  of small objects    Manipulation of small objects small beads on golf tees x 10 golf tees with 6 small beads each. Pt placed beads with RUE and removed with emphasis on in hand manipulation for putting all 6 into RUE palm and translating one peg at a time to fingertips and sorting to colors                    OT Short Term Goals - 06/25/20 1018      OT SHORT TERM GOAL #1   Title Pt will be independent with HEP 06/21/20    Time 4    Period Weeks    Status Achieved    Target Date 06/21/20      OT SHORT TERM GOAL #2   Title Pt will demonstrate improved fine motor coordination by demonstrating decrease in 9 hole peg test score by 6 seconds with RUE.    Baseline RUE 71.06    Time 4    Period Weeks    Status On-going      OT SHORT TERM GOAL #3   Title Pt will perform simple warm meal prep or home management task with supervision and good safety awareness.    Time 4    Period Weeks    Status On-going      OT SHORT TERM GOAL #4   Title Pt will write 2-3 sentences with 75% legibility and increased size of letters (mild micrographia).    Baseline severe micrographia    Time 4    Period Weeks    Status On-going      OT SHORT TERM GOAL #5   Title Pt will demonstrate increase in grip strength by 5 lbs in RUE for increased ease with ADLs and clothing management, etc.    Baseline RUE 45.4 (LUE 65.6)    Time 4    Period Weeks    Status On-going      OT SHORT TERM GOAL #6   Title Pt will report using RUE (dominant hand) for 25% or more of functional tasks.    Time 4    Period Weeks    Status On-going             OT Long Term Goals - 05/24/20 1651      OT LONG TERM GOAL #1   Title Pt will be independent with updated HEP 07/19/2020    Time 8    Period Weeks    Status New    Target Date 07/19/20      OT LONG TERM GOAL #2   Title Pt will demonstrate improved fine motor coordination by demonstrating decrease in 9 hole peg test score by 15 seconds with RUE.    Time 8     Period Weeks    Status New  OT LONG TERM GOAL #3   Title Pt will perform work simulated tasks (typing, using mouse, etc) with RUE as dominant hand with 90% accuracy.    Time 8    Period Weeks    Status New      OT LONG TERM GOAL #4   Title Pt will report completing at least 1 home management task per day with mod I in order to decrease caregiver burden.    Time 8    Period Weeks    Status New      OT LONG TERM GOAL #5   Title Pt will improve grip strength by at least 10 lbs in RUE for strengthening dominant hand for functional use and tasks.    Baseline RUE 45.4 (LUE 65.6)    Time 8    Period Weeks    Status New      OT LONG TERM GOAL #6   Title Pt will report using RUE (dominant hand) for at least 75% of functional tasks per day.    Time 8    Period Weeks    Status New      OT LONG TERM GOAL #7   Title Pt will perform physical and cognitive tasks simultaneously with 95% accuracy for returning to prior tasks.    Time 8    Period Weeks    Status New      OT LONG TERM GOAL #8   Title Pt will write 3-5 sentences with 90% legibility and appropriate sizing.    Time 8    Period Weeks    Status New                 Plan - 06/25/20 1027    Clinical Impression Statement Pt continues to progress towards goals. Pt reports continued difficulty with RUE coordination    OT Occupational Profile and History Detailed Assessment- Review of Records and additional review of physical, cognitive, psychosocial history related to current functional performance    Occupational performance deficits (Please refer to evaluation for details): IADL's;ADL's;Social Participation;Leisure;Work;Rest and Sleep    Body Structure / Function / Physical Skills ADL;Dexterity;Decreased knowledge of use of DME;Strength;UE functional use;Endurance;IADL;Coordination;FMC    Rehab Potential Good    Clinical Decision Making Limited treatment options, no task modification necessary    Comorbidities  Affecting Occupational Performance: None    Modification or Assistance to Complete Evaluation  No modification of tasks or assist necessary to complete eval    OT Frequency 2x / week    OT Duration 8 weeks   or 16 visits (+ eval) over extended time   OT Treatment/Interventions Self-care/ADL training;DME and/or AE instruction;Gait Training;Therapeutic activities;Balance training;Cognitive remediation/compensation;Therapeutic exercise;Neuromuscular education;Passive range of motion;Visual/perceptual remediation/compensation;Patient/family education;Energy conservation    Plan continue coordination activities for [place] coordination HEP    Consulted and Agree with Plan of Care Patient;Family member/caregiver   spouse   Family Member Consulted spouse           Patient will benefit from skilled therapeutic intervention in order to improve the following deficits and impairments:   Body Structure / Function / Physical Skills: ADL,Dexterity,Decreased knowledge of use of DME,Strength,UE functional use,Endurance,IADL,Coordination,FMC       Visit Diagnosis: Other lack of coordination  Muscle weakness (generalized)  Attention and concentration deficit  Hemiplegia and hemiparesis following cerebral infarction affecting right dominant side [facility])    Problem List Patient Active Problem List   Diagnosis Date Noted  . Stroke [facility])  05/16/2020  . Acute focal neurological deficit 05/15/2020  . BPH (benign prostatic hyperplasia) 02/02/2016  . Fractured atrial pacemaker lead wire 07/18/2015  . 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 Chest pain 08/02/2013  . SOB (shortness of breath) 08/02/2013  . PVC (premature ventricular contraction) 02/13/2013  . Chest pain on exertion 02/25/2012  . Pacemaker-dual-chamber Medtronic 04/29/2011  . Change in bowel habits 01/22/2011  . Weight loss 01/22/2011  . COUGH 09/01/2010  . Multiple sclerosis ([place])   . HOCM (hypertrophic obstructive  cardiomyopathy) ([place])   . Right bundle branch block   . Complete heart block ([place]) 12/05/2007    Zachery Conch MOT, OTR/L  06/25/2020, 11:14 AM  [facility] [address] [place], [place], [address] Phone: [phone]   Fax:  [phone]  Name: Rodney Adkins MRN: [id number] Date of Birth: May 19, 1956

## 2020-06-25 NOTE — Therapy (Signed)
[facility] [address] [place], [place], [address] Phone: [phone]   Fax:  [phone]  Speech Language Pathology Treatment  Patient Details  Name: Rodney Adkins MRN: [id number] Date of Birth: 20-Apr-1956 Referring Provider (SLP): Darliss Cheney,  MD   Encounter Date: 06/25/2020   End of Session - 06/25/20 1414    Visit Number 4    Number of Visits 17    Date for SLP Re-Evaluation 09/10/20    SLP Start Time 1157    SLP Stop Time  1237    SLP Time Calculation (min) 40 min    Activity Tolerance Patient tolerated treatment well           Past Medical History:  Diagnosis Date  . Complete heart block [facility]) June 2009   a. Presented with hypotension/pulm edema 12/2007 when HOCM was also discovered. s/p dual chamber MDT pacemaker. Thallium stest 7/09 negative for infiltrative disease or ischemia.  Followed Dr [name]  Marland Kitchen Heart murmur   . HOCM (hypertrophic obstructive cardiomyopathy) ([place]) 2009   Discovered by ECHO when he developed CHB. Mgmt Dr Caryl Comes  and Dr [name] at [facility]. Has outflow obstruction on ECHO 2011  . Multiple sclerosis ([place]) 1998   Sxs started 1998 with L body numbness. MRI c/w MS. Started Avonex. Saw Dr [name] at [facility] and switched to Betaseron and was on for 5 yrs but developed depression and site rxns and D/C'd 2006. While on Betaseron, occassionally required solumedrol for flares.  . Pacemaker 2017  . Prostate enlargement   . Right bundle branch block   . SOB (shortness of breath)    a. PFTs 09/2010 essentially normal per pulm note (mild obst defect). b. 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  . 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 Pacer reprogrammed with improvement in sx.    Past Surgical History:  Procedure Laterality Date  . LEFT  HEART CATHETERIZATION WITH CORONARY ANGIOGRAM N/A 08/22/2013   Procedure: LEFT HEART CATHETERIZATION WITH CORONARY ANGIOGRAM;  Surgeon: [name], MD;  Location: [facility] CATH LAB;  Service: Cardiovascular;  Laterality: N/A;  . LEG TENDON SURGERY  2008   Os peroneus resection and peroneal tendon repair  . pace Agricultural engineer    . PACEMAKER INSERTION  12/2007   Complete heart block  . PACEMAKER LEAD REMOVAL Left 07/18/2015   Procedure: ATRIAL PACEMAKER LEAD EXTRACTION; ATRIAL PACEMAKER LEAD INSERTION; GENERATOR REPLACEMENT.;  Surgeon: [name], MD;  Location: [facility];  Service: Cardiovascular;  Laterality: Left;  [name] to back up  . PERIPHERAL VASCULAR CATHETERIZATION N/A 02/12/2015   Procedure: Venogram;  Surgeon: [name], MD;  Location: [facility] CV LAB;  Service: Cardiovascular;  Laterality: N/A;  . TEE WITHOUT CARDIOVERSION N/A 07/18/2015   Procedure: TRANSESOPHAGEAL ECHOCARDIOGRAM (TEE);  Surgeon: [name], MD;  Location: [facility] OR;  Service: Cardiovascular;  Laterality: N/A;    There were no vitals filed for this visit.   Subjective Assessment - 06/25/20 1217    Subjective Pt reports continuing with PHOrTE exercises since last session.    Currently in Pain? No/denies                 ADULT SLP TREATMENT - 06/25/20 1219      General Information   Behavior/Cognition Alert;Cooperative;Pleasant mood  Treatment Provided   Treatment provided Cognitive-Linquistic;Dysphagia      Dysphagia Treatment   Patient observed directly with PO's Yes    Type of PO's observed Thin liquids    Liquids provided via Cup    Oral Phase Signs & Symptoms --   none   Pharyngeal Phase Signs & Symptoms Audible swallow    Other treatment/comments SLP assessed pt's liquids today - no coughing, throat clearing or hydrophonia indicating possible copmenstory measures are working for pt. Pt stated he is "being more midful and careful" about how he is swallowing liquids. He states with "little  things" like rice he has to put more thought into how to swallow them but does not have the frequency of difficulty that he has hsitorically had with liquids.With consecutive multiple sips liquid, pt was again being more careful and SLP saw pt take extra time with 3 consecutive sips. No coughing, throat clearing, or hydrophonia observed with consecutive multiple sips.      Cognitive-Linquistic Treatment   Treatment focused on Dysarthria    Skilled Treatment Pt with noted intermittent arresting vocal quality with /a/ sustained - had him try initial /h/ on these productions. With loud /a/, pt did not exhibit as frequent arresting quality as last session, and voice was generally higher volume with more vocal vibration than previous session. Performance of pitch glides were better today, with more real vocal quality instead of intermittent aphonia, and better gliding pitch than previous session - pt's range of pitch was more normal than previous session. Pt had more differentiation with lower and higher pitches for last exercise with senences. Pt was schedulued for ST eval for cognitition and communication (SLE) at [facility] on 07-03-20, and SLP told pt/wife they coudl attend that session if they feel it is necessary but we are treating his cognition and communication here. SLP reiterated that in time, we may refer to [facility] for VOICE evaluation if clinically indicated. Wife stated she would use [facility] to communicate that they will cx the SLE on 07-03-20.      Assessment / Recommendations / Plan   Plan Continue with current plan of care      Progression Toward Goals   Progression toward goals Progressing toward goals              SLP Short Term Goals - 06/25/20 1417      SLP SHORT TERM GOAL #1   Title pt will undergo objective swallow assessment    Time 1    Period Weeks    Status On-going      SLP SHORT TERM GOAL #2   Title pt will demonstrate dysarthria HEP with rare min A over 3  sessions    Time 3    Period Weeks   or 9 sessions, for all STGs   Status On-going      SLP SHORT TERM GOAL #3   Title pt will demonstrage voice HEP with rare min A over 3 sessions    Baseline 06-25-20    Time 3    Period Weeks    Status New      SLP SHORT TERM GOAL #4   Title pt will engage in 8 mintues conversation maintaining gentle voice in 3 sesions, to limit possibility of habitualizing muscle tension dysphonia    Time 3    Period Weeks    Status On-going      SLP SHORT TERM GOAL #5   Title pt will demonstrate speech compensations in sentence responses  80% of the time, in 3 sesssions    Time 3    Period Weeks    Status On-going            SLP Long Term Goals - 06/25/20 1417      SLP LONG TERM GOAL #1   Title pt will demonstrate voice HEP with modified indpendnece in 3 sessions    Time 7    Period Weeks   or 17 sessions, for all LTGs   Status On-going      SLP LONG TERM GOAL #2   Title pt will engage in 12 mintues conversation maintaining gentle voice 80% of the time, to limit possibility of habitualizing muscle tension dysphonia    Time 7    Period Weeks    Status On-going      SLP LONG TERM GOAL #3   Title pt will engage in 12 mintues mod complex conversation using speech strategies 80% of the time, in 3 sessions    Time 7    Period Weeks    Status On-going      SLP LONG TERM GOAL #4   Title pt will demonstrate improved score on Voice-related quality of life (QOL) measure (indicating better QOL) from therapy session #1    Time 7    Period Weeks    Status On-going            Plan - 06/25/20 1414    Clinical Impression Statement Rodney "Marden Noble" presents today with mild flaccid dysarthria, and 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 Today PHoRTE program was reviewed and SLP provided min cues rarely.SLP also assessed pt's swallowing with liquids. Pt reports his swallowing has improved over the last 1-2 weeks,  and therefore a modified barium swallow exam is recommended if pt's swallow remains static into the first week in January. Pt denies overt s/s aspiration PNA, and rare coughing with liquids - he remains more mindful about how he swallows liquids. Skilled ST is necessary to improve pt's swallowing, speech, adn voice quality. If pt's voice does not show improvement a referral to ENT may be necessary.    Speech Therapy Frequency 2x / week    Duration 8 weeks   17 visits   Treatment/Interventions Aspiration precaution training;Pharyngeal strengthening exercises;Diet toleration management by SLP;Trials of upgraded texture/liquids;Cueing hierarchy;SLP instruction and feedback;Functional tasks;Oral motor exercises;Compensatory strategies;Patient/family education;Internal/external aids;Other (comment)           Patient will benefit from skilled therapeutic intervention in order to improve the following deficits and impairments:   Voice hoarseness  Dysarthria and anarthria  Dysphagia, unspecified type    Problem List Patient Active Problem List   Diagnosis Date Noted  . Stroke ([place]) 05/16/2020  . Acute focal neurological deficit 05/15/2020  . BPH (benign prostatic hyperplasia) 02/02/2016  . Fractured atrial pacemaker lead wire 07/18/2015  . 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 Chest pain 08/02/2013  . SOB (shortness of breath) 08/02/2013  . PVC (premature ventricular contraction) 02/13/2013  . Chest pain on exertion 02/25/2012  . Pacemaker-dual-chamber Medtronic 04/29/2011  . Change in bowel habits 01/22/2011  . Weight loss 01/22/2011  . COUGH 09/01/2010  . Multiple sclerosis ([place])   . HOCM (hypertrophic obstructive cardiomyopathy) ([place])   . Right bundle branch block   . Complete heart block ([place]) 12/05/2007    [place] ,[employer], [place]  06/25/2020, 2:18 PM  [facility] [address] [place], [place], [address] Phone:  [phone]   Fax:  [phone]   Name: Rodney Adkins MRN: [id number] Date of Birth: 1955/08/05

## 2020-06-26 NOTE — Progress Notes (Signed)
Remote pacemaker transmission.   

## 2020-06-27 ENCOUNTER — Ambulatory Visit: Payer: BC Managed Care – PPO

## 2020-06-27 ENCOUNTER — Other Ambulatory Visit: Payer: Self-pay

## 2020-06-27 ENCOUNTER — Encounter: Payer: Self-pay | Admitting: Occupational Therapy

## 2020-06-27 ENCOUNTER — Ambulatory Visit: Payer: BC Managed Care – PPO | Admitting: Occupational Therapy

## 2020-06-27 DIAGNOSIS — R471 Dysarthria and anarthria: Secondary | ICD-10-CM

## 2020-06-27 DIAGNOSIS — I69351 Hemiplegia and hemiparesis following cerebral infarction affecting right dominant side: Secondary | ICD-10-CM

## 2020-06-27 DIAGNOSIS — R490 Dysphonia: Secondary | ICD-10-CM

## 2020-06-27 DIAGNOSIS — M6281 Muscle weakness (generalized): Secondary | ICD-10-CM

## 2020-06-27 DIAGNOSIS — R2689 Other abnormalities of gait and mobility: Secondary | ICD-10-CM

## 2020-06-27 DIAGNOSIS — R41840 Attention and concentration deficit: Secondary | ICD-10-CM

## 2020-06-27 DIAGNOSIS — R1310 Dysphagia, unspecified: Secondary | ICD-10-CM

## 2020-06-27 DIAGNOSIS — R278 Other lack of coordination: Secondary | ICD-10-CM

## 2020-06-27 NOTE — Therapy (Signed)
[facility] [address] [place], [place], [address] Phone: [phone]   Fax:  [phone]  Speech Language Pathology Treatment  Patient Details  Name: Rodney Adkins MRN: [id number] Date of Birth: 10-26-1955 Referring Provider (SLP): Hughie Closs,  MD   Encounter Date: 06/27/2020   End of Session - 06/27/20 1255    Visit Number 5    Number of Visits 17    Date for SLP Re-Evaluation 09/10/20    SLP Start Time 1148    SLP Stop Time  1230    SLP Time Calculation (min) 42 min    Activity Tolerance Patient tolerated treatment well           Past Medical History:  Diagnosis Date  . Complete heart block [facility]) June 2009   a. Presented with hypotension/pulm edema 12/2007 when HOCM was also discovered. s/p dual chamber MDT pacemaker. Thallium stest 7/09 negative for infiltrative disease or ischemia.  Followed Dr Graciela Husbands.  Marland Kitchen Heart murmur   . HOCM (hypertrophic obstructive cardiomyopathy) (HCC) 2009   Discovered by ECHO when he developed CHB. Mgmt Dr Graciela Husbands  and Dr Regino Schultze at [facility]. Has outflow obstruction on ECHO 2011  . Multiple sclerosis (HCC) 1998   Sxs started 1998 with L body numbness. MRI c/w MS. Started Avonex. Saw Dr Leotis Shames at [facility] and switched to Betaseron and was on for 5 yrs but developed depression and site rxns and D/C'd 2006. While on Betaseron, occassionally required solumedrol for flares.  . Pacemaker 2017  . Prostate enlargement   . Right bundle branch block   . SOB (shortness of breath)    a. PFTs 09/2010 essentially normal per pulm note (mild obst defect). b. 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  . 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 Pacer reprogrammed with improvement in sx.    Past Surgical History:  Procedure Laterality Date  . LEFT  HEART CATHETERIZATION WITH CORONARY ANGIOGRAM N/A 08/22/2013   Procedure: LEFT HEART CATHETERIZATION WITH CORONARY ANGIOGRAM;  Surgeon: Lesleigh Noe, MD;  Location: [facility] CATH LAB;  Service: Cardiovascular;  Laterality: N/A;  . LEG TENDON SURGERY  2008   Os peroneus resection and peroneal tendon repair  . pace Cytogeneticist    . PACEMAKER INSERTION  12/2007   Complete heart block  . PACEMAKER LEAD REMOVAL Left 07/18/2015   Procedure: ATRIAL PACEMAKER LEAD EXTRACTION; ATRIAL PACEMAKER LEAD INSERTION; GENERATOR REPLACEMENT.;  Surgeon: Marinus Maw, MD;  Location: [facility] OR;  Service: Cardiovascular;  Laterality: Left;  [name] to back up  . PERIPHERAL VASCULAR CATHETERIZATION N/A 02/12/2015   Procedure: Venogram;  Surgeon: [name], MD;  Location: [facility] INVASIVE CV LAB;  Service: Cardiovascular;  Laterality: N/A;  . TEE WITHOUT CARDIOVERSION N/A 07/18/2015   Procedure: TRANSESOPHAGEAL ECHOCARDIOGRAM (TEE);  Surgeon: Marinus Maw, MD;  Location: [facility] OR;  Service: Cardiovascular;  Laterality: N/A;    There were no vitals filed for this visit.   Subjective Assessment - 06/27/20 1148    Subjective Wife stated at end of previous session that prior to CVA, recently, pt's voice had gotten softer - she ?s due to MS.    Currently in Pain? No/denies                 ADULT SLP TREATMENT - 06/27/20 1148  General Information   Behavior/Cognition Alert;Cooperative;Pleasant mood      Treatment Provided   Treatment provided Cognitive-Linquistic      Cognitive-Linquistic Treatment   Treatment focused on Dysarthria    Skilled Treatment Minimal arresting quality again with loud /a/ - increasing frequency on reps 3 and 4 of 4. Produced  average mid 80s dB initially, eventually in low to mid 80s dB. SLP engaged pt in short conversation (2-4 minutes) and SLP provided occasional mod cues for exaggeration of speech sounds due to occasional slurring of words. When pt asked SLP to tell him if he cannot be  understood his articulation improved due to incr'd pt mindfulness of overarticulation.      Assessment / Recommendations / Plan   Plan Continue with current plan of care      Progression Toward Goals   Progression toward goals Progressing toward goals            SLP Education - 06/27/20 1254    Education Details reiterated rationale for overarticulation    Person(s) Educated Patient    Methods Explanation;Demonstration    Comprehension Verbalized understanding;Need further instruction;Returned demonstration;Verbal cues required            SLP Short Term Goals - 06/27/20 1256      SLP SHORT TERM GOAL #1   Title pt will undergo objective swallow assessment    Time 1    Period Weeks    Status On-going      SLP SHORT TERM GOAL #2   Title pt will demonstrate dysarthria HEP with rare min A over 3 sessions    Time 3    Period Weeks   or 9 sessions, for all STGs   Status On-going      SLP SHORT TERM GOAL #3   Title pt will demonstrage voice HEP with rare min A over 3 sessions    Baseline 06-25-20    Time 3    Period Weeks    Status New      SLP SHORT TERM GOAL #4   Title pt will engage in 8 mintues conversation maintaining gentle voice in 3 sesions, to limit possibility of habitualizing muscle tension dysphonia    Time 3    Period Weeks    Status On-going      SLP SHORT TERM GOAL #5   Title pt will demonstrate speech compensations in sentence responses 80% of the time, in 3 sesssions    Time 3    Period Weeks    Status On-going            SLP Long Term Goals - 06/27/20 1256      SLP LONG TERM GOAL #1   Title pt will demonstrate voice HEP with modified indpendnece in 3 sessions    Time 7    Period Weeks   or 17 sessions, for all LTGs   Status On-going      SLP LONG TERM GOAL #2   Title pt will engage in 12 mintues conversation maintaining gentle voice 80% of the time, to limit possibility of habitualizing muscle tension dysphonia    Time 7    Period Weeks     Status On-going      SLP LONG TERM GOAL #3   Title pt will engage in 12 mintues mod complex conversation using speech strategies 80% of the time, in 3 sessions    Time 7    Period Weeks    Status On-going  SLP LONG TERM GOAL #4   Title pt will demonstrate improved score on Voice-related quality of life (QOL) measure (indicating better QOL) from therapy session #1    Time 7    Period Weeks    Status On-going            Plan - 06/27/20 1255    Clinical Impression Statement Rodney "Marden Noble" presents today with mild flaccid dysarthria, and 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 Pt denies overt s/s aspiration PNA, and rare coughing with liquids - he remains more mindful about how he swallows liquids. Skilled ST is necessary to improve pt's swallowing, speech, adn voice quality. If pt's voice does not show improvement a referral to ENT may be necessary.    Speech Therapy Frequency 2x / week    Duration 8 weeks   17 visits   Treatment/Interventions Aspiration precaution training;Pharyngeal strengthening exercises;Diet toleration management by SLP;Trials of upgraded texture/liquids;Cueing hierarchy;SLP instruction and feedback;Functional tasks;Oral motor exercises;Compensatory strategies;Patient/family education;Internal/external aids;Other (comment)           Patient will benefit from skilled therapeutic intervention in order to improve the following deficits and impairments:   Dysarthria and anarthria  Dysphagia, unspecified type  Voice hoarseness    Problem List Patient Active Problem List   Diagnosis Date Noted  . Stroke ([place]) 05/16/2020  . Acute focal neurological deficit 05/15/2020  . BPH (benign prostatic hyperplasia) 02/02/2016  . Fractured atrial pacemaker lead wire 07/18/2015  . 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 Chest pain 08/02/2013  . SOB (shortness of breath) 08/02/2013  . PVC (premature ventricular  contraction) 02/13/2013  . Chest pain on exertion 02/25/2012  . Pacemaker-dual-chamber Medtronic 04/29/2011  . Change in bowel habits 01/22/2011  . Weight loss 01/22/2011  . COUGH 09/01/2010  . Multiple sclerosis ([place])   . HOCM (hypertrophic obstructive cardiomyopathy) ([place])   . Right bundle branch block   . Complete heart block ([place]) 12/05/2007    [place] ,[place], [place]  06/27/2020, 12:56 PM  [facility] [address] [place], [place], [address] Phone: [phone]   Fax:  [phone]   Name: Rodney Adkins MRN: [id number] Date of Birth: Jan 22, 1956

## 2020-06-27 NOTE — Therapy (Signed)
[facility] [address] [place], [place], [address] Phone: [phone]   Fax:  [phone]  Physical Therapy Treatment  Patient Details  Name: Rodney Adkins MRN: [id number] Date of Birth: [birth year] Referring Provider (PT): Otto Herb, MD   Encounter Date: 06/27/2020   PT End of Session - 06/27/20 1320    Visit Number 11    Number of Visits 17    Authorization Type BCBS PPO - FOTO patient    PT Start Time 1317    PT Stop Time 1358    PT Time Calculation (min) 41 min    Equipment Utilized During Treatment Gait belt    Activity Tolerance Patient tolerated treatment well    Behavior During Therapy WFL for tasks assessed/performed           Past Medical History:  Diagnosis Date  . Complete heart block [facility]) June 2009   a. Presented with hypotension/pulm edema 12/2007 when HOCM was also discovered. s/p dual chamber MDT pacemaker. Thallium stest 7/09 negative for infiltrative disease or ischemia.  Followed Dr [name]  Marland Kitchen Heart murmur   . HOCM (hypertrophic obstructive cardiomyopathy) ([place]) 2009   Discovered by ECHO when he developed CHB. Mgmt Dr Caryl Comes  and Dr [name] at [facility]. Has outflow obstruction on ECHO 2011  . Multiple sclerosis ([place]) 1998   Sxs started 1998 with L body numbness. MRI c/w MS. Started Avonex. Saw Dr [name] at [facility] and switched to Betaseron and was on for 5 yrs but developed depression and site rxns and D/C'd 2006. While on Betaseron, occassionally required solumedrol for flares.  . Pacemaker 2017  . Prostate enlargement   . Right bundle branch block   . SOB (shortness of breath)    a. PFTs 09/2010 essentially normal per pulm note (mild obst defect). b. 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  . 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  Pacer reprogrammed with improvement in sx.    Past Surgical History:  Procedure Laterality Date  . LEFT HEART CATHETERIZATION WITH CORONARY ANGIOGRAM N/A 08/22/2013   Procedure: LEFT HEART CATHETERIZATION WITH CORONARY ANGIOGRAM;  Surgeon: [name], MD;  Location: [facility] CATH LAB;  Service: Cardiovascular;  Laterality: N/A;  . LEG TENDON SURGERY  2008   Os peroneus resection and peroneal tendon repair  . pace Agricultural engineer    . PACEMAKER INSERTION  12/2007   Complete heart block  . PACEMAKER LEAD REMOVAL Left 07/18/2015   Procedure: ATRIAL PACEMAKER LEAD EXTRACTION; ATRIAL PACEMAKER LEAD INSERTION; GENERATOR REPLACEMENT.;  Surgeon: [name], MD;  Location: [facility];  Service: Cardiovascular;  Laterality: Left;  [name] to back up  . PERIPHERAL VASCULAR CATHETERIZATION N/A 02/12/2015   Procedure: Venogram;  Surgeon: [name], MD;  Location: [facility] CV LAB;  Service: Cardiovascular;  Laterality: N/A;  . TEE WITHOUT CARDIOVERSION N/A 07/18/2015   Procedure: TRANSESOPHAGEAL ECHOCARDIOGRAM (TEE);  Surgeon: [name], MD;  Location: [facility] OR;  Service: Cardiovascular;  Laterality: N/A;    There were no vitals filed for this visit.   Subjective Assessment - 06/27/20 1320    Subjective Pt reports that he is doing well. Walking in with walking stick.    Patient is accompained by: Family member    Pertinent History MS, CHB s/p PPM, HOCM    Limitations Walking;Writing;Standing;Lifting;House  hold activities    Patient Stated Goals wants to be able to walk better.    Currently in Pain? No/denies                             [facility] Adult PT Treatment/Exercise - 06/27/20 1321      Ambulation/Gait   Ambulation/Gait Yes    Ambulation/Gait Assistance 5: Supervision    Ambulation/Gait Assistance Details Pt ambulated first bout without right foot-up brace on level surface. Pt was cued to focus on increasing right foot clearance and relax right arm to get more arm swing. Then  donned foot-up brace and walked outside. Pt was challenged most over grass needing reminder to pick up right foot more from hip as caught foot a couple times. Pt also performed stepping over parking lot bumper x 4 leading with both RLE and then LLE. Did better leading with RLE so could be sure it cleared. Pt had slighlty improved clearance with foot-up brace but he did not feel it was significant. Did give information on name of brace and where he could get if he decided in future as may be more helpful for longer walks as leg fatigues more.    Ambulation Distance (Feet) 230 Feet   850   Assistive device --   walking stick   Gait Pattern Step-through pattern;Decreased hip/knee flexion - right    Ambulation Surface Level;Unlevel;Indoor;Outdoor;Paved;Gravel    Stairs Yes    Stairs Assistance 6: Modified independent (Device/Increase time);4: Min guard;5: Supervision    Stairs Assistance Details (indicate cue type and reason) Pt performed first 12 steps with reciprocal pattern with left rail. The switched to using walking stick with reciprocal pattern up and step-to pattern down with supervision/CGA for last 8 steps.    Ramp 5: Supervision    Ramp Details (indicate cue type and reason) up/down ramp with blue mat on it x 3 laps with verbal cues to increase right hip flexion at times.      Neuro Re-ed    Neuro Re-ed Details  Sit to stand from low mat with blue folded mat under feet with feet in staggered stance with RLE posterior to try to increase right use x 10. Standing on blue mat tapping 3 different colored cones in front x 10 with CGA/min assist when SLS on right and CGA on left stance.                    PT Short Term Goals - 06/19/20 9242      PT SHORT TERM GOAL #1   Title Pt will be independent with initial HEP.    Baseline Pt reports that he has been working on the exercises.    Time 4    Period Weeks    Status Achieved    Target Date 06/18/20      PT SHORT TERM GOAL #2    Title Pt will decrease 5xSTS to </= 15 seconds for decreased fall risk.    Baseline 22 s without UE support from standard arm chair 05/21/20. 06/19/20 15.79 sec from chair with hands and 17.37 sec without hands    Time 4    Period Weeks    Status Not Met    Target Date 06/18/20      PT SHORT TERM GOAL #3   Title Pt will increase gait speed to >/= 0.75 m/s for increased access to community and decreased fall risk.  Baseline 0.65 m/s with RW 05/21/20. 0.17ms with walker and 0.747m with cane    Time 4    Period Weeks    Status Achieved    Target Date 06/18/20      PT SHORT TERM GOAL #4   Title Pt will increase Berg score by 4 points in order to decrease fall risk and increase static and dynamic balance by a clincal detectable change (4 points = CDC).    Baseline 45/56 ind mod fall risk 05/21/20. 06/19/20 Berg=51/56    Time 4    Period Weeks    Status Achieved    Target Date 06/18/20             PT Long Term Goals - 06/19/20 0940      PT LONG TERM GOAL #1   Title Pt will be independent with progressive HEP.    Baseline NA    Time 8    Period Weeks    Status New      PT LONG TERM GOAL #2   Title Pt will decrease 5xSTS to </= 12 seconds to no longer be classified as fall risk and to be WNL for age and gender.    Baseline 22 s from standard arm chair without UE support. 06/19/20 17.37 sec    Time 8    Period Weeks    Status New      PT LONG TERM GOAL #3   Title Pt will reach >52/56 on Berg balance score for improved static and dynamic balance and decreased risk for falls.    Baseline 45/56 (mod fall risk) 05/21/20. 06/19/20 51/56    Time 8    Period Weeks    Status Revised      PT LONG TERM GOAL #4   Title Pt will reach 1.0 m/s gait speed for community ambulation and decreased fall risk.    Baseline 0.65 m/s. 06/19/20 0.9852mwith walker and 0.2m77mith cane    Time 8    Period Weeks    Status New      PT LONG TERM GOAL #5   Title Pt will ambulated 800+ feet  on outdoor/unlevel surfaces with supervision and LRAD.    Baseline NT    Time 8    Period Weeks    Status New      PT LONG TERM GOAL #6   Title Pt will increase FOTO from 46 to >/= 70 for increased confidence in abilities.    Baseline 46 05/21/20    Time 8    Period Weeks    Status New                 Plan - 06/27/20 1920    Clinical Impression Statement PT again utilized right foot up brace with gait today. Pt did not feel much difference today. PT did give information on where to purchase if he decided in the future. Continue to focus on reciprocal stepping over obstacles and on steps today.    Personal Factors and Comorbidities Comorbidity 3+;Age    Comorbidities MS, CHB s/p PPM, HOCM    Examination-Activity Limitations Squat;Locomotion Level;Transfers;Stairs;Stand    Examination-Participation Restrictions Yard Work;Occupation;Driving;Community Activity    Stability/Clinical Decision Making Evolving/Moderate complexity    Rehab Potential Good    PT Frequency 2x / week    PT Duration 8 weeks    PT Treatment/Interventions Gait training;Stair training;Functional mobility training;Therapeutic activities;Therapeutic exercise;Balance training;Neuromuscular re-education;Patient/family education;Orthotic Fit/Training;DME Instruction;ADLs/Self Care Home Management;Energy conservation;Manual techniques;Vestibular  PT Next Visit Plan gait training with SPC with quad tip/walking stick (scanning, obstacles, over unlevel surfaces). corner balance exercises and on compliant surfaces. RLE strengthening, PT gave info on right foot up brace if pt decides to get in the future.    Consulted and Agree with Plan of Care Patient;Family member/caregiver    Family Member Consulted Wife           Patient will benefit from skilled therapeutic intervention in order to improve the following deficits and impairments:  Abnormal gait,Decreased coordination,Difficulty walking,Decreased  endurance,Decreased activity tolerance,Impaired perceived functional ability,Decreased balance,Decreased mobility,Decreased strength,Improper body mechanics  Visit Diagnosis: Other abnormalities of gait and mobility  Muscle weakness (generalized)     Problem List Patient Active Problem List   Diagnosis Date Noted  . Stroke ([place]) 05/16/2020  . Acute focal neurological deficit 05/15/2020  . BPH (benign prostatic hyperplasia) 02/02/2016  . Fractured atrial pacemaker lead wire 07/18/2015  . 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 Chest pain 08/02/2013  . SOB (shortness of breath) 08/02/2013  . PVC (premature ventricular contraction) 02/13/2013  . Chest pain on exertion 02/25/2012  . Pacemaker-dual-chamber Medtronic 04/29/2011  . Change in bowel habits 01/22/2011  . Weight loss 01/22/2011  . COUGH 09/01/2010  . Multiple sclerosis ([place])   . HOCM (hypertrophic obstructive cardiomyopathy) ([place])   . Right bundle branch block   . Complete heart block ([place]) 12/05/2007    Electa Sniff, PT, DPT, NCS 06/27/2020, 7:24 PM  [facility] [address] [place], [place], [address] Phone: [phone]   Fax:  [phone]  Name: Rodney Adkins MRN: [id number] Date of Birth: [birth year]

## 2020-06-27 NOTE — Therapy (Signed)
[facility] [address] [place], [place], [address] Phone: [phone]   Fax:  [phone]  Occupational Therapy Treatment  Patient Details  Name: Rodney Adkins MRN: [id number] Date of Birth: 06-15-1956 Referring Provider (OT): Darliss Cheney, MD   Encounter Date: 06/27/2020   OT End of Session - 06/27/20 1234    Visit Number 8    Number of Visits 17    Date for OT Re-Evaluation 07/19/20    Authorization Type State BCBS    Authorization Time Period VL:[place] Week 4 of 8 (12/21)    OT Start Time 1234    OT Stop Time 1315    OT Time Calculation (min) 41 min    Activity Tolerance Patient tolerated treatment well    Behavior During Therapy [facility] for tasks assessed/performed           Past Medical History:  Diagnosis Date  . Complete heart block [facility]) June 2009   a. Presented with hypotension/pulm edema 12/2007 when HOCM was also discovered. s/p dual chamber MDT pacemaker. Thallium stest 7/09 negative for infiltrative disease or ischemia.  Followed Dr [name]  Marland Kitchen Heart murmur   . HOCM (hypertrophic obstructive cardiomyopathy) ([place]) 2009   Discovered by ECHO when he developed CHB. Mgmt Dr Caryl Comes  and Dr [name] at [facility]. Has outflow obstruction on ECHO 2011  . Multiple sclerosis ([place]) 1998   Sxs started 1998 with L body numbness. MRI c/w MS. Started Avonex. Saw Dr [name] at [facility] and switched to Betaseron and was on for 5 yrs but developed depression and site rxns and D/C'd 2006. While on Betaseron, occassionally required solumedrol for flares.  . Pacemaker 2017  . Prostate enlargement   . Right bundle branch block   . SOB (shortness of breath)    a. PFTs 09/2010 essentially normal per pulm note (mild obst defect). b. 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  . Wenckebach    a. Decreased ex tolerance 2010/2012 - underwent stress echo to look @ effects of MV/LVOT - nothing noted; although pt had high rate Wenckebach  behavior with associated ex intol. Pacer reprogrammed with improvement in sx.    Past Surgical History:  Procedure Laterality Date  . LEFT HEART CATHETERIZATION WITH CORONARY ANGIOGRAM N/A 08/22/2013   Procedure: LEFT HEART CATHETERIZATION WITH CORONARY ANGIOGRAM;  Surgeon: [name], MD;  Location: [facility] CATH LAB;  Service: Cardiovascular;  Laterality: N/A;  . LEG TENDON SURGERY  2008   Os peroneus resection and peroneal tendon repair  . pace Agricultural engineer    . PACEMAKER INSERTION  12/2007   Complete heart block  . PACEMAKER LEAD REMOVAL Left 07/18/2015   Procedure: ATRIAL PACEMAKER LEAD EXTRACTION; ATRIAL PACEMAKER LEAD INSERTION; GENERATOR REPLACEMENT.;  Surgeon: [name], MD;  Location: [facility];  Service: Cardiovascular;  Laterality: Left;  [name] to back up  . PERIPHERAL VASCULAR CATHETERIZATION N/A 02/12/2015   Procedure: Venogram;  Surgeon: [name], MD;  Location: [facility] CV LAB;  Service: Cardiovascular;  Laterality: N/A;  . TEE WITHOUT CARDIOVERSION N/A 07/18/2015   Procedure: TRANSESOPHAGEAL ECHOCARDIOGRAM (TEE);  Surgeon: [name], MD;  Location: [facility] OR;  Service: Cardiovascular;  Laterality: N/A;    There were no vitals filed for this visit.   Subjective Assessment - 06/27/20 1235    Subjective  Pt denies any pain.    Patient is accompanied by: Family member   spouse   Pertinent History Fall Risk. Speech Deficits.  Limitations MS, CHB s/p PPM, HOCM    Patient Stated Goals right hand. 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 Return to driving recommendations.    Currently in Pain? No/denies                        OT Treatments/Exercises (OP) - 06/27/20 1247      Fine Motor Coordination (Hand/Wrist)   In Hand Manipulation Training large and regular size number dice with no drops. Golf balls x 2 with rotation in RUE and with 1 drop and increased time. In hand manipulation with small beads by grabbing  handful of beads and sorting one a time into appropriate color.    Handwriting distal finger control worksheet with RUE with use of pen with coban for added grip and built up pen for coordination. Pt w deviateion approx <1/8" minimal times. Overall, increased coordination noted with activity. 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 Pt encouraged to "think big" with writing of equation as first attempt was barely legible to unfamiliar audience                    OT Short Term Goals - 06/27/20 1251      OT SHORT TERM GOAL #1   Title Pt will be independent with HEP 06/21/20    Time 4    Period Weeks    Status Achieved    Target Date 06/21/20      OT SHORT TERM GOAL #2   Title Pt will demonstrate improved fine motor coordination by demonstrating decrease in 9 hole peg test score by 6 seconds with RUE.    Baseline RUE 71.06    Time 4    Period Weeks    Status Achieved      OT SHORT TERM GOAL #3   Title Pt will perform simple warm meal prep or home management task with supervision and good safety awareness.    Time 4    Period Weeks    Status On-going      OT SHORT TERM GOAL #4   Title Pt will write 2-3 sentences with 75% legibility and increased size of letters (mild micrographia).    Baseline severe micrographia    Time 4    Period Weeks    Status On-going      OT SHORT TERM GOAL #5   Title Pt will demonstrate increase in grip strength by 5 lbs in RUE for increased ease with ADLs and clothing management, etc.    Baseline RUE 45.4 (LUE 65.6)    Time 4    Period Weeks    Status On-going      OT SHORT TERM GOAL #6   Title Pt will report using RUE (dominant hand) for 25% or more of functional tasks.    Time 4    Period Weeks    Status On-going             OT Long Term Goals - 06/27/20 1253      OT LONG TERM GOAL #1   Title Pt will be independent with updated HEP 07/19/2020    Time 8     Period Weeks    Status New      OT LONG TERM GOAL #2   Title Pt will demonstrate improved fine motor coordination by demonstrating decrease in 9 hole peg test score by  15 seconds with RUE.    Time 8    Period Weeks    Status Achieved      OT LONG TERM GOAL #3   Title Pt will perform work simulated tasks (typing, using mouse, etc) with RUE as dominant hand with 90% accuracy.    Time 8    Period Weeks    Status New      OT LONG TERM GOAL #4   Title Pt will report completing at least 1 home management task per day with mod I in order to decrease caregiver burden.    Time 8    Period Weeks    Status New      OT LONG TERM GOAL #5   Title Pt will improve grip strength by at least 10 lbs in RUE for strengthening dominant hand for functional use and tasks.    Baseline RUE 45.4 (LUE 65.6)    Time 8    Period Weeks    Status New      OT LONG TERM GOAL #6   Title Pt will report using RUE (dominant hand) for at least 75% of functional tasks per day.    Time 8    Period Weeks    Status New      OT LONG TERM GOAL #7   Title Pt will perform physical and cognitive tasks simultaneously with 95% accuracy for returning to prior tasks.    Time 8    Period Weeks    Status New      OT LONG TERM GOAL #8   Title Pt will write 3-5 sentences with 90% legibility and appropriate sizing.    Time 8    Period Weeks    Status New                  Patient will benefit from skilled therapeutic intervention in order to improve the following deficits and impairments:           Visit Diagnosis: Other lack of coordination  Muscle weakness (generalized)  Hemiplegia and hemiparesis following cerebral infarction affecting right dominant side (HCC)  Other abnormalities of gait and mobility  Attention and concentration deficit    Problem List Patient Active Problem List   Diagnosis Date Noted  . Stroke ([place]) 05/16/2020  . Acute focal neurological deficit 05/15/2020  . BPH  (benign prostatic hyperplasia) 02/02/2016  . Fractured atrial pacemaker lead wire 07/18/2015  . 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 Chest pain 08/02/2013  . SOB (shortness of breath) 08/02/2013  . PVC (premature ventricular contraction) 02/13/2013  . Chest pain on exertion 02/25/2012  . Pacemaker-dual-chamber Medtronic 04/29/2011  . Change in bowel habits 01/22/2011  . Weight loss 01/22/2011  . COUGH 09/01/2010  . Multiple sclerosis ([place])   . HOCM (hypertrophic obstructive cardiomyopathy) ([place])   . Right bundle branch block   . Complete heart block ([place]) 12/05/2007    Zachery Conch MOT, OTR/L  06/27/2020, 1:00 PM  [facility] [address] [place], [place], [address] Phone: [phone]   Fax:  [phone]  Name: JAMES GUARDADO MRN: [id number] Date of Birth: April 07, 1956

## 2020-07-02 ENCOUNTER — Ambulatory Visit: Payer: BC Managed Care – PPO

## 2020-07-02 ENCOUNTER — Other Ambulatory Visit: Payer: Self-pay

## 2020-07-02 ENCOUNTER — Encounter: Payer: Self-pay | Admitting: Occupational Therapy

## 2020-07-02 ENCOUNTER — Ambulatory Visit: Payer: BC Managed Care – PPO | Admitting: Occupational Therapy

## 2020-07-02 DIAGNOSIS — R41840 Attention and concentration deficit: Secondary | ICD-10-CM

## 2020-07-02 DIAGNOSIS — I69351 Hemiplegia and hemiparesis following cerebral infarction affecting right dominant side: Secondary | ICD-10-CM

## 2020-07-02 DIAGNOSIS — R1310 Dysphagia, unspecified: Secondary | ICD-10-CM

## 2020-07-02 DIAGNOSIS — M6281 Muscle weakness (generalized): Secondary | ICD-10-CM

## 2020-07-02 DIAGNOSIS — R471 Dysarthria and anarthria: Secondary | ICD-10-CM

## 2020-07-02 DIAGNOSIS — R278 Other lack of coordination: Secondary | ICD-10-CM

## 2020-07-02 DIAGNOSIS — R2689 Other abnormalities of gait and mobility: Secondary | ICD-10-CM

## 2020-07-02 DIAGNOSIS — R490 Dysphonia: Secondary | ICD-10-CM

## 2020-07-02 NOTE — Therapy (Signed)
[facility] [address] [place], [place], [address] Phone: [phone]   Fax:  [phone]  Occupational Therapy Treatment  Patient Details  Name: Rodney Adkins MRN: [id number] Date of Birth: 06-28-1956 Referring Provider (OT): Darliss Cheney, MD   Encounter Date: 07/02/2020   OT End of Session - 07/02/20 0914    Visit Number 9    Number of Visits 17    Date for OT Re-Evaluation 07/19/20    Authorization Type State BCBS    Authorization Time Period VL:MN Week 4 of 8 (12/21)    OT Start Time 0851    OT Stop Time 0930    OT Time Calculation (min) 39 min    Activity Tolerance Patient tolerated treatment well    Behavior During Therapy [facility] for tasks assessed/performed           Past Medical History:  Diagnosis Date   Complete heart block [facility]) June 2009   a. Presented with hypotension/pulm edema 12/2007 when HOCM was also discovered. s/p dual chamber MDT pacemaker. Thallium stest 7/09 negative for infiltrative disease or ischemia.  Followed Dr [name]   Heart murmur    HOCM (hypertrophic obstructive cardiomyopathy) ([place]) 2009   Discovered by ECHO when he developed CHB. Mgmt Dr Caryl Comes  and Dr [name] at [facility]. Has outflow obstruction on ECHO 2011   Multiple sclerosis ([place]) 1998   Sxs started 1998 with L body numbness. MRI c/w MS. Started Avonex. Saw Dr [name] at [facility] and switched to Betaseron and was on for 5 yrs but developed depression and site rxns and D/C'd 2006. While on Betaseron, occassionally required solumedrol for flares.   Pacemaker 2017   Prostate enlargement    Right bundle branch block    SOB (shortness of breath)    a. PFTs 09/2010 essentially normal per pulm note (mild obst defect). b. 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   Wenckebach    a. Decreased ex tolerance 2010/2012 - underwent stress echo to look @ effects of MV/LVOT - nothing noted; although pt had high rate Wenckebach  behavior with associated ex intol. Pacer reprogrammed with improvement in sx.    Past Surgical History:  Procedure Laterality Date   LEFT HEART CATHETERIZATION WITH CORONARY ANGIOGRAM N/A 08/22/2013   Procedure: LEFT HEART CATHETERIZATION WITH CORONARY ANGIOGRAM;  Surgeon: [name], MD;  Location: [facility] CATH LAB;  Service: Cardiovascular;  Laterality: N/A;   LEG TENDON SURGERY  2008   Os peroneus resection and peroneal tendon repair   pace maker     PACEMAKER INSERTION  12/2007   Complete heart block   PACEMAKER LEAD REMOVAL Left 07/18/2015   Procedure: ATRIAL PACEMAKER LEAD EXTRACTION; ATRIAL PACEMAKER LEAD INSERTION; GENERATOR REPLACEMENT.;  Surgeon: [name], MD;  Location: [facility];  Service: Cardiovascular;  Laterality: Left;  [name] to back up   PERIPHERAL VASCULAR CATHETERIZATION N/A 02/12/2015   Procedure: Venogram;  Surgeon: [name], MD;  Location: [facility] CV LAB;  Service: Cardiovascular;  Laterality: N/A;   TEE WITHOUT CARDIOVERSION N/A 07/18/2015   Procedure: TRANSESOPHAGEAL ECHOCARDIOGRAM (TEE);  Surgeon: [name], MD;  Location: [facility] OR;  Service: Cardiovascular;  Laterality: N/A;    There were no vitals filed for this visit.   Subjective Assessment - 07/02/20 0913    Subjective  Pt reports working on handwriting    Pertinent History Fall Risk. Speech Deficits.    Limitations MS, CHB s/p PPM, HOCM    Patient  Stated Goals right hand. Getting hand working better with typing, working mouse on Animator, Chiropractor. Return to driving recommendations.    Currently in Pain? No/denies                  Treatment: tracing activity with highlighter followed by writing several sentences, min v.c for larger letter size, pt wrote 4 sentences with good legibility and letter size using pen with coban wrap. Copying small peg design with RUE for increased fine motor coordination with a cognitive component, min difficulty for coordination, pt copied  design correctly. Removing pegs with in hand manipulation. Gripper set a level 2 to pick up 1 inch blocks for sustained grip, min difficulty/ v.c                OT Short Term Goals - 07/02/20 0915      OT SHORT TERM GOAL #1   Title Pt will be independent with HEP 06/21/20    Time 4    Period Weeks    Status Achieved    Target Date 06/21/20      OT SHORT TERM GOAL #2   Title Pt will demonstrate improved fine motor coordination by demonstrating decrease in 9 hole peg test score by 6 seconds with RUE.    Baseline RUE 71.06    Time 4    Period Weeks    Status Achieved      OT SHORT TERM GOAL #3   Title Pt will perform simple warm meal prep or home management task with supervision and good safety awareness.    Time 4    Period Weeks    Status On-going      OT SHORT TERM GOAL #4   Title Pt will write 2-3 sentences with 75% legibility and increased size of letters (mild micrographia).    Baseline severe micrographia    Time 4    Period Weeks    Status Achieved   3 sentences with good letter size and legibility     OT SHORT TERM GOAL #5   Title Pt will demonstrate increase in grip strength by 5 lbs in RUE for increased ease with ADLs and clothing management, etc.    Baseline RUE 45.4 (LUE 65.6)    Time 4    Period Weeks    Status On-going      OT SHORT TERM GOAL #6   Title Pt will report using RUE (dominant hand) for 25% or more of functional tasks.    Time 4    Period Weeks    Status On-going             OT Long Term Goals - 06/27/20 1253      OT LONG TERM GOAL #1   Title Pt will be independent with updated HEP 07/19/2020    Time 8    Period Weeks    Status New      OT LONG TERM GOAL #2   Title Pt will demonstrate improved fine motor coordination by demonstrating decrease in 9 hole peg test score by 15 seconds with RUE.    Time 8    Period Weeks    Status Achieved      OT LONG TERM GOAL #3   Title Pt will perform work simulated tasks (typing,  using mouse, etc) with RUE as dominant hand with 90% accuracy.    Time 8    Period Weeks    Status New      OT LONG TERM  GOAL #4   Title Pt will report completing at least 1 home management task per day with mod I in order to decrease caregiver burden.    Time 8    Period Weeks    Status New      OT LONG TERM GOAL #5   Title Pt will improve grip strength by at least 10 lbs in RUE for strengthening dominant hand for functional use and tasks.    Baseline RUE 45.4 (LUE 65.6)    Time 8    Period Weeks    Status New      OT LONG TERM GOAL #6   Title Pt will report using RUE (dominant hand) for at least 75% of functional tasks per day.    Time 8    Period Weeks    Status New      OT LONG TERM GOAL #7   Title Pt will perform physical and cognitive tasks simultaneously with 95% accuracy for returning to prior tasks.    Time 8    Period Weeks    Status New      OT LONG TERM GOAL #8   Title Pt will write 3-5 sentences with 90% legibility and appropriate sizing.    Time 8    Period Weeks    Status New                 Plan - 07/02/20 0915    Clinical Impression Statement Pt is making progress towards goals with improving handwriting legibility and letter size.    OT Occupational Profile and History Detailed Assessment- Review of Records and additional review of physical, cognitive, psychosocial history related to current functional performance    Occupational performance deficits (Please refer to evaluation for details): IADL's;ADL's;Social Participation;Leisure;Work;Rest and Sleep    Body Structure / Function / Physical Skills ADL;Dexterity;Decreased knowledge of use of DME;Strength;UE functional use;Endurance;IADL;Coordination;FMC    Rehab Potential Good    Clinical Decision Making Limited treatment options, no task modification necessary    Comorbidities Affecting Occupational Performance: None    Modification or Assistance to Complete Evaluation  No modification of tasks  or assist necessary to complete eval    OT Frequency 2x / week    OT Duration 8 weeks   or 16 visits (+ eval) over extended time   OT Treatment/Interventions Self-care/ADL training;DME and/or AE instruction;Gait Training;Therapeutic activities;Balance training;Cognitive remediation/compensation;Therapeutic exercise;Neuromuscular education;Passive range of motion;Visual/perceptual remediation/compensation;Patient/family education;Energy conservation    Plan simple cooking task, check short term goals    OT Home Exercise Plan coordination HEP    Consulted and Agree with Plan of Care Patient;Family member/caregiver   spouse   Family Member Consulted spouse           Patient will benefit from skilled therapeutic intervention in order to improve the following deficits and impairments:   Body Structure / Function / Physical Skills: ADL,Dexterity,Decreased knowledge of use of DME,Strength,UE functional use,Endurance,IADL,Coordination,FMC       Visit Diagnosis: Muscle weakness (generalized)  Other lack of coordination  Hemiplegia and hemiparesis following cerebral infarction affecting right dominant side ([place])  Attention and concentration deficit    Problem List Patient Active Problem List   Diagnosis Date Noted   Stroke ([place]) 05/16/2020   Acute focal neurological deficit 05/15/2020   BPH (benign prostatic hyperplasia) 02/02/2016   Fractured atrial pacemaker lead wire 07/18/2015   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 [place], [place], [address] Phone: [phone]   Fax:  [phone]  Name: JEREMIYAH MORGESE MRN: [id number] Date of Birth: June 27, 1956

## 2020-07-02 NOTE — Therapy (Signed)
[facility] [address] [place], [place], [address] Phone: [phone]   Fax:  [phone]  Physical Therapy Treatment  Patient Details  Name: Rodney Adkins MRN: [id number] Date of Birth: 1956-07-06 Referring Provider (PT): Otto Herb, MD   Encounter Date: 07/02/2020   PT End of Session - 07/02/20 0929    Visit Number 12    Number of Visits 17    Authorization Type BCBS PPO - FOTO patient    PT Start Time 0930    PT Stop Time 1013    PT Time Calculation (min) 43 min    Equipment Utilized During Treatment Gait belt    Activity Tolerance Patient tolerated treatment well    Behavior During Therapy WFL for tasks assessed/performed           Past Medical History:  Diagnosis Date  . Complete heart block [facility]) June 2009   a. Presented with hypotension/pulm edema 12/2007 when HOCM was also discovered. s/p dual chamber MDT pacemaker. Thallium stest 7/09 negative for infiltrative disease or ischemia.  Followed Dr [name]  Marland Kitchen Heart murmur   . HOCM (hypertrophic obstructive cardiomyopathy) ([place]) 2009   Discovered by ECHO when he developed CHB. Mgmt Dr Caryl Comes  and Dr [name] at [facility]. Has outflow obstruction on ECHO 2011  . Multiple sclerosis ([place]) 1998   Sxs started 1998 with L body numbness. MRI c/w MS. Started Avonex. Saw Dr [name] at [facility] and switched to Betaseron and was on for 5 yrs but developed depression and site rxns and D/C'd 2006. While on Betaseron, occassionally required solumedrol for flares.  . Pacemaker 2017  . Prostate enlargement   . Right bundle branch block   . SOB (shortness of breath)    a. PFTs 09/2010 essentially normal per pulm note (mild obst defect). b. 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  . 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  Pacer reprogrammed with improvement in sx.    Past Surgical History:  Procedure Laterality Date  . LEFT HEART CATHETERIZATION WITH CORONARY ANGIOGRAM N/A 08/22/2013   Procedure: LEFT HEART CATHETERIZATION WITH CORONARY ANGIOGRAM;  Surgeon: [name], MD;  Location: [facility] CATH LAB;  Service: Cardiovascular;  Laterality: N/A;  . LEG TENDON SURGERY  2008   Os peroneus resection and peroneal tendon repair  . pace Agricultural engineer    . PACEMAKER INSERTION  12/2007   Complete heart block  . PACEMAKER LEAD REMOVAL Left 07/18/2015   Procedure: ATRIAL PACEMAKER LEAD EXTRACTION; ATRIAL PACEMAKER LEAD INSERTION; GENERATOR REPLACEMENT.;  Surgeon: [name], MD;  Location: [facility];  Service: Cardiovascular;  Laterality: Left;  [name] to back up  . PERIPHERAL VASCULAR CATHETERIZATION N/A 02/12/2015   Procedure: Venogram;  Surgeon: [name], MD;  Location: [facility] CV LAB;  Service: Cardiovascular;  Laterality: N/A;  . TEE WITHOUT CARDIOVERSION N/A 07/18/2015   Procedure: TRANSESOPHAGEAL ECHOCARDIOGRAM (TEE);  Surgeon: [name], MD;  Location: [facility] OR;  Service: Cardiovascular;  Laterality: N/A;    There were no vitals filed for this visit.   Subjective Assessment - 07/02/20 0931    Subjective Patient reports no new changes/complaints since last visit. No falls to report. Patient still using walking stick primarily.    Patient is accompained by: Family member    Pertinent History MS, CHB s/p PPM,  HOCM    Limitations Walking;Writing;Standing;Lifting;House hold activities    Patient Stated Goals wants to be able to walk better.    Currently in Pain? No/denies                   [facility] Adult PT Treatment/Exercise - 07/02/20 0937      Transfers   Transfers Sit to Stand;Stand to Sit    Sit to Stand 5: Supervision    Stand to Sit 5: Supervision    Comments completed sit <> stands with red balance beam under BLE x 10 reps. increased balance challenge on red balance beam, intermittent UE  support required and CGA.      Ambulation/Gait   Ambulation/Gait Yes    Ambulation/Gait Assistance 5: Supervision    Ambulation/Gait Assistance Details PT donned R Foot Up Brace. Patient has not purchased at this time. Completed ambulation on outdoor paved surfaces x 500 ft with walking stick. improved toe clearance noted, but decreased hip/knee flexion noted requiring verbal cues from PTto promote improved clearance. mild fatigue after ambulation distance. One instance of catching RLE, requiring CGA from PT.    Ambulation Distance (Feet) 500 Feet    Assistive device Other (Comment)   walking stick   Gait Pattern Step-through pattern;Decreased hip/knee flexion - right    Ambulation Surface Unlevel;Outdoor;Paved      Exercises   Exercises Other Exercises    Other Exercises  Patient reporting interest in Sci[employer] and wishing had something like this at home. PT showing patient seated exercise peddler, and completed with patient x 2 minutes. PT educating on this is an more affordable option for purchase if interested in completion at home.               Balance Exercises - 07/02/20 0001      Balance Exercises: Standing   Standing Eyes Opened Narrow base of support (BOS);Head turns;Foam/compliant surface;Limitations    Standing Eyes Opened Limitations completed on airex, with narrow BOS and horizontal/vertical head turns x 10 reps each direction.    Standing Eyes Closed Narrow base of support (BOS);Foam/compliant surface;3 reps;30 secs    Standing Eyes Closed Limitations completed 3 x 30 seconds on airex with vision removed.    Tandem Stance Eyes open;Intermittent upper extremity support;Limitations    Tandem Stance Time solid surface; alternating foot position completed x 30 secs each, then progressed to completing horizontal/vertical head turns x 10 reps with each foot position.    Stepping Strategy Anterior;Posterior;Foam/compliant surface;Limitations    Stepping Strategy Limitations  completed x 10 reps alternating with BLE off blue balance beam, increased difficulty with RLE foot clearance at times.    Tandem Gait Forward;Intermittent upper extremity support;2 reps;Limitations    Tandem Gait Limitations completed x 2 reps, down and back in // bars, intermittent UE support for balance and CGA from PT.               PT Short Term Goals - 06/19/20 8610      PT SHORT TERM GOAL #1   Title Pt will be independent with initial HEP.    Baseline Pt reports that he has been working on the exercises.    Time 4    Period Weeks    Status Achieved    Target Date 06/18/20      PT SHORT TERM GOAL #2   Title Pt will decrease 5xSTS to </= 15 seconds for decreased fall risk.    Baseline 22 s without UE support from standard  arm chair 05/21/20. 06/19/20 15.79 sec from chair with hands and 17.37 sec without hands    Time 4    Period Weeks    Status Not Met    Target Date 06/18/20      PT SHORT TERM GOAL #3   Title Pt will increase gait speed to >/= 0.75 m/s for increased access to community and decreased fall risk.    Baseline 0.65 m/s with RW 05/21/20. 0.70ms with walker and 0.745m with cane    Time 4    Period Weeks    Status Achieved    Target Date 06/18/20      PT SHORT TERM GOAL #4   Title Pt will increase Berg score by 4 points in order to decrease fall risk and increase static and dynamic balance by a clincal detectable change (4 points = CDC).    Baseline 45/56 ind mod fall risk 05/21/20. 06/19/20 Berg=51/56    Time 4    Period Weeks    Status Achieved    Target Date 06/18/20             PT Long Term Goals - 06/19/20 0940      PT LONG TERM GOAL #1   Title Pt will be independent with progressive HEP.    Baseline NA    Time 8    Period Weeks    Status New      PT LONG TERM GOAL #2   Title Pt will decrease 5xSTS to </= 12 seconds to no longer be classified as fall risk and to be WNL for age and gender.    Baseline 22 s from standard arm chair  without UE support. 06/19/20 17.37 sec    Time 8    Period Weeks    Status New      PT LONG TERM GOAL #3   Title Pt will reach >52/56 on Berg balance score for improved static and dynamic balance and decreased risk for falls.    Baseline 45/56 (mod fall risk) 05/21/20. 06/19/20 51/56    Time 8    Period Weeks    Status Revised      PT LONG TERM GOAL #4   Title Pt will reach 1.0 m/s gait speed for community ambulation and decreased fall risk.    Baseline 0.65 m/s. 06/19/20 0.9867mwith walker and 0.67m35mith cane    Time 8    Period Weeks    Status New      PT LONG TERM GOAL #5   Title Pt will ambulated 800+ feet on outdoor/unlevel surfaces with supervision and LRAD.    Baseline NT    Time 8    Period Weeks    Status New      PT LONG TERM GOAL #6   Title Pt will increase FOTO from 46 to >/= 70 for increased confidence in abilities.    Baseline 46 05/21/20    Time 8    Period Weeks    Status New                 Plan - 07/02/20 1025    Clinical Impression Statement Continued to utilize R Foot Up Brace throughout session today with continued ambulation over outdoor paved surfaces. Continued balance actvities focused on complaint surface and vision removed. Will continue to progress toward all unmet goals.    Personal Factors and Comorbidities Comorbidity 3+;Age    Comorbidities MS, CHB s/p PPM, HOCM    Examination-Activity Limitations Squat;Locomotion  Level;Transfers;Stairs;Stand    Examination-Participation Restrictions Yard Work;Occupation;Driving;Community Activity    Stability/Clinical Decision Making Evolving/Moderate complexity    Rehab Potential Good    PT Frequency 2x / week    PT Duration 8 weeks    PT Treatment/Interventions Gait training;Stair training;Functional mobility training;Therapeutic activities;Therapeutic exercise;Balance training;Neuromuscular re-education;Patient/family education;Orthotic Fit/Training;DME Instruction;ADLs/Self Care Home  Management;Energy conservation;Manual techniques;Vestibular    PT Next Visit Plan gait training with SPC with quad tip/walking stick (scanning, obstacles, over unlevel surfaces). corner balance exercises and on compliant surfaces. RLE strengthening, PT gave info on right foot up brace if pt decides to get in the future.    Consulted and Agree with Plan of Care Patient;Family member/caregiver    Family Member Consulted Wife           Patient will benefit from skilled therapeutic intervention in order to improve the following deficits and impairments:  Abnormal gait,Decreased coordination,Difficulty walking,Decreased endurance,Decreased activity tolerance,Impaired perceived functional ability,Decreased balance,Decreased mobility,Decreased strength,Improper body mechanics  Visit Diagnosis: Other abnormalities of gait and mobility  Muscle weakness (generalized)     Problem List Patient Active Problem List   Diagnosis Date Noted  . Stroke ([place]) 05/16/2020  . Acute focal neurological deficit 05/15/2020  . BPH (benign prostatic hyperplasia) 02/02/2016  . Fractured atrial pacemaker lead wire 07/18/2015  . 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 Chest pain 08/02/2013  . SOB (shortness of breath) 08/02/2013  . PVC (premature ventricular contraction) 02/13/2013  . Chest pain on exertion 02/25/2012  . Pacemaker-dual-chamber Medtronic 04/29/2011  . Change in bowel habits 01/22/2011  . Weight loss 01/22/2011  . COUGH 09/01/2010  . Multiple sclerosis ([place])   . HOCM (hypertrophic obstructive cardiomyopathy) ([place])   . Right bundle branch block   . Complete heart block ([place]) 12/05/2007    Jones Bales, PT, DPT 07/02/2020, 10:28 AM  [facility] [address] [place], [place], [address] Phone: [phone]   Fax:  [phone]  Name: Rodney Adkins MRN: [id number] Date of Birth: 1956/01/06

## 2020-07-02 NOTE — Therapy (Signed)
[facility] [address] [place], [place], [address] Phone: [phone]   Fax:  [phone]  Speech Language Pathology Treatment  Patient Details  Name: Rodney Adkins MRN: [id number] Date of Birth: 1955-11-30 Referring Provider (SLP): Darliss Cheney,  MD   Encounter Date: 07/02/2020   End of Session - 07/02/20 1221    Visit Number 6    Number of Visits 17    Date for SLP Re-Evaluation 09/10/20    SLP Start Time 64    SLP Stop Time  1100    SLP Time Calculation (min) 40 min    Activity Tolerance Patient tolerated treatment well           Past Medical History:  Diagnosis Date  . Complete heart block [facility]) June 2009   a. Presented with hypotension/pulm edema 12/2007 when HOCM was also discovered. s/p dual chamber MDT pacemaker. Thallium stest 7/09 negative for infiltrative disease or ischemia.  Followed Dr [name]  Marland Kitchen Heart murmur   . HOCM (hypertrophic obstructive cardiomyopathy) ([place]) 2009   Discovered by ECHO when he developed CHB. Mgmt Dr Caryl Comes  and Dr [name] at [facility]. Has outflow obstruction on ECHO 2011  . Multiple sclerosis ([place]) 1998   Sxs started 1998 with L body numbness. MRI c/w MS. Started Avonex. Saw Dr [name] at [facility] and switched to Betaseron and was on for 5 yrs but developed depression and site rxns and D/C'd 2006. While on Betaseron, occassionally required solumedrol for flares.  . Pacemaker 2017  . Prostate enlargement   . Right bundle branch block   . SOB (shortness of breath)    a. PFTs 09/2010 essentially normal per pulm note (mild obst defect). b. 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  . 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 Pacer reprogrammed with improvement in sx.    Past Surgical History:  Procedure Laterality Date  . LEFT  HEART CATHETERIZATION WITH CORONARY ANGIOGRAM N/A 08/22/2013   Procedure: LEFT HEART CATHETERIZATION WITH CORONARY ANGIOGRAM;  Surgeon: [name], MD;  Location: [facility] CATH LAB;  Service: Cardiovascular;  Laterality: N/A;  . LEG TENDON SURGERY  2008   Os peroneus resection and peroneal tendon repair  . pace Agricultural engineer    . PACEMAKER INSERTION  12/2007   Complete heart block  . PACEMAKER LEAD REMOVAL Left 07/18/2015   Procedure: ATRIAL PACEMAKER LEAD EXTRACTION; ATRIAL PACEMAKER LEAD INSERTION; GENERATOR REPLACEMENT.;  Surgeon: [name], MD;  Location: [facility];  Service: Cardiovascular;  Laterality: Left;  [name] to back up  . PERIPHERAL VASCULAR CATHETERIZATION N/A 02/12/2015   Procedure: Venogram;  Surgeon: [name], MD;  Location: [facility] CV LAB;  Service: Cardiovascular;  Laterality: N/A;  . TEE WITHOUT CARDIOVERSION N/A 07/18/2015   Procedure: TRANSESOPHAGEAL ECHOCARDIOGRAM (TEE);  Surgeon: [name], MD;  Location: [facility] OR;  Service: Cardiovascular;  Laterality: N/A;    There were no vitals filed for this visit.   Subjective Assessment - 07/02/20 1023    Subjective "I think it has gotten better on the glide from high down to low. BUt that doesn't mean it will be better today."    Currently in Pain? No/denies                 ADULT SLP TREATMENT - 07/02/20 1024  General Information   Behavior/Cognition Alert;Cooperative;Pleasant mood      Treatment Provided   Treatment provided Cognitive-Linquistic      Cognitive-Linquistic Treatment   Treatment focused on Dysarthria    Skilled Treatment In 4 minutes conversation pt did not use speech compensations. SLP cued pt about this and in the next 10 minutes conversation pt engaged in compesatory speech measures which improved pt's overall intelligibility. Minimal arresting quality with loud /a/ (rep 3/3) - average mid 80s. In pitch modulation/range exercise, SLP again cued pt to move back to original  lower tone  with some dififculty - SLP searched online and found online pitch measure and shared this with pt via AVS. Pt's frequency of voicing (less-frequent aphonia)  is greater than last week.      Assessment / Recommendations / Plan   Plan Continue with current plan of care      Progression Toward Goals   Progression toward goals Progressing toward goals            SLP Education - 07/02/20 1221    Education Details how to track pitch at home to match starting with ending pitch in pitch glides    Person(s) Educated Patient    Methods Explanation;Handout    Comprehension Verbalized understanding            SLP Short Term Goals - 07/02/20 1223      SLP SHORT TERM GOAL #1   Title pt will undergo objective swallow assessment    Status On-going      SLP SHORT TERM GOAL #2   Title pt will demonstrate dysarthria HEP with rare min A over 3 sessions    Period --   or 9 sessions, for all STGs   Status Partially Met      SLP SHORT TERM GOAL #3   Title pt will demonstrage voice HEP with rare min A over 3 sessions    Baseline 06-25-20, 07-02-20    Time 2    Period Weeks    Status On-going      SLP SHORT TERM GOAL #4   Title pt will engage in 8 mintues conversation maintaining gentle voice in 3 sesions, to limit possibility of habitualizing muscle tension dysphonia    Baseline 07-02-20    Time 2    Period Weeks    Status On-going      SLP SHORT TERM GOAL #5   Title pt will demonstrate speech compensations in sentence responses 80% of the time, in 3 sesssions    Baseline 07-02-20    Time 2    Period Weeks    Status On-going            SLP Long Term Goals - 07/02/20 1225      SLP LONG TERM GOAL #1   Title pt will demonstrate voice HEP with modified indpendnece in 3 sessions    Time 6    Period Weeks   or 17 sessions, for all LTGs   Status On-going      SLP LONG TERM GOAL #2   Title pt will engage in 12 mintues conversation maintaining gentle voice 80% of the time, to limit  possibility of habitualizing muscle tension dysphonia    Time 6    Period Weeks    Status On-going      SLP LONG TERM GOAL #3   Title pt will engage in 12 mintues mod complex conversation using speech strategies 80% of the time, in 3 sessions  Time 6    Period Weeks    Status On-going      SLP LONG TERM GOAL #4   Title pt will demonstrate improved score on Voice-related quality of life (QOL) measure (indicating better QOL) from therapy session #1    Time 6    Period Weeks    Status On-going            Plan - 07/02/20 1222    Clinical Impression Statement Kasey "Marden Noble" presents today with mild flaccid dysarthria, and 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 Pt denies overt s/s aspiration PNA, and rare coughing with liquids - he remains more mindful about how he swallows liquids. Pt has been working with [employer] exercises at home. Skilled ST is necessary to improve pt's swallowing, speech, adn voice quality. If pt's voice does not show improvement a referral to ENT may be necessary.    Speech Therapy Frequency 2x / week    Duration 8 weeks   17 visits   Treatment/Interventions Aspiration precaution training;Pharyngeal strengthening exercises;Diet toleration management by SLP;Trials of upgraded texture/liquids;Cueing hierarchy;SLP instruction and feedback;Functional tasks;Oral motor exercises;Compensatory strategies;Patient/family education;Internal/external aids;Other (comment)           Patient will benefit from skilled therapeutic intervention in order to improve the following deficits and impairments:   Dysarthria and anarthria  Dysphagia, unspecified type  Voice hoarseness    Problem List Patient Active Problem List   Diagnosis Date Noted  . Stroke ([place]) 05/16/2020  . Acute focal neurological deficit 05/15/2020  . BPH (benign prostatic hyperplasia) 02/02/2016  . Fractured atrial pacemaker lead wire 07/18/2015  .  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 Chest pain 08/02/2013  . SOB (shortness of breath) 08/02/2013  . PVC (premature ventricular contraction) 02/13/2013  . Chest pain on exertion 02/25/2012  . Pacemaker-dual-chamber Medtronic 04/29/2011  . Change in bowel habits 01/22/2011  . Weight loss 01/22/2011  . COUGH 09/01/2010  . Multiple sclerosis ([place])   . HOCM (hypertrophic obstructive cardiomyopathy) ([place])   . Right bundle branch block   . Complete heart block ([place]) 12/05/2007    [place] ,[place], [place]  07/02/2020, 12:26 PM  [facility] [address] [place], [place], [address] Phone: [phone]   Fax:  [phone]   Name: KEANE MARTELLI MRN: [id number] Date of Birth: Jun 04, 1956

## 2020-07-02 NOTE — Patient Instructions (Signed)
° °  [email or web address]

## 2020-07-04 ENCOUNTER — Ambulatory Visit: Payer: BC Managed Care – PPO

## 2020-07-04 ENCOUNTER — Other Ambulatory Visit: Payer: Self-pay

## 2020-07-04 ENCOUNTER — Ambulatory Visit: Payer: BC Managed Care – PPO | Admitting: Occupational Therapy

## 2020-07-04 DIAGNOSIS — R2689 Other abnormalities of gait and mobility: Secondary | ICD-10-CM | POA: Diagnosis not present

## 2020-07-04 DIAGNOSIS — M6281 Muscle weakness (generalized): Secondary | ICD-10-CM

## 2020-07-04 DIAGNOSIS — R1310 Dysphagia, unspecified: Secondary | ICD-10-CM

## 2020-07-04 DIAGNOSIS — I69351 Hemiplegia and hemiparesis following cerebral infarction affecting right dominant side: Secondary | ICD-10-CM

## 2020-07-04 DIAGNOSIS — R41840 Attention and concentration deficit: Secondary | ICD-10-CM

## 2020-07-04 DIAGNOSIS — R471 Dysarthria and anarthria: Secondary | ICD-10-CM

## 2020-07-04 DIAGNOSIS — R490 Dysphonia: Secondary | ICD-10-CM

## 2020-07-04 DIAGNOSIS — R278 Other lack of coordination: Secondary | ICD-10-CM

## 2020-07-04 NOTE — Therapy (Signed)
[facility] [address] [place], [place], [address] Phone: [phone]   Fax:  [phone]  Speech Language Pathology Treatment  Patient Details  Name: Rodney Adkins MRN: [id number] Date of Birth: December 11, 1955 Referring Provider (SLP): Darliss Cheney,  MD   Encounter Date: 07/04/2020   End of Session - 07/04/20 1316    Visit Number 7    Number of Visits 17    Date for SLP Re-Evaluation 09/10/20    SLP Start Time 1103    SLP Stop Time  1145    SLP Time Calculation (min) 42 min    Activity Tolerance Patient tolerated treatment well           Past Medical History:  Diagnosis Date  . Complete heart block [facility]) June 2009   a. Presented with hypotension/pulm edema 12/2007 when HOCM was also discovered. s/p dual chamber MDT pacemaker. Thallium stest 7/09 negative for infiltrative disease or ischemia.  Followed Dr [name]  Marland Kitchen Heart murmur   . HOCM (hypertrophic obstructive cardiomyopathy) ([place]) 2009   Discovered by ECHO when he developed CHB. Mgmt Dr Caryl Comes  and Dr [name] at [facility]. Has outflow obstruction on ECHO 2011  . Multiple sclerosis ([place]) 1998   Sxs started 1998 with L body numbness. MRI c/w MS. Started Avonex. Saw Dr [name] at [facility] and switched to Betaseron and was on for 5 yrs but developed depression and site rxns and D/C'd 2006. While on Betaseron, occassionally required solumedrol for flares.  . Pacemaker 2017  . Prostate enlargement   . Right bundle branch block   . SOB (shortness of breath)    a. PFTs 09/2010 essentially normal per pulm note (mild obst defect). b. 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  . 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 Pacer reprogrammed with improvement in sx.    Past Surgical History:  Procedure Laterality Date  . LEFT  HEART CATHETERIZATION WITH CORONARY ANGIOGRAM N/A 08/22/2013   Procedure: LEFT HEART CATHETERIZATION WITH CORONARY ANGIOGRAM;  Surgeon: [name], MD;  Location: [facility] CATH LAB;  Service: Cardiovascular;  Laterality: N/A;  . LEG TENDON SURGERY  2008   Os peroneus resection and peroneal tendon repair  . pace Agricultural engineer    . PACEMAKER INSERTION  12/2007   Complete heart block  . PACEMAKER LEAD REMOVAL Left 07/18/2015   Procedure: ATRIAL PACEMAKER LEAD EXTRACTION; ATRIAL PACEMAKER LEAD INSERTION; GENERATOR REPLACEMENT.;  Surgeon: [name], MD;  Location: [facility];  Service: Cardiovascular;  Laterality: Left;  [name] to back up  . PERIPHERAL VASCULAR CATHETERIZATION N/A 02/12/2015   Procedure: Venogram;  Surgeon: [name], MD;  Location: [facility] CV LAB;  Service: Cardiovascular;  Laterality: N/A;  . TEE WITHOUT CARDIOVERSION N/A 07/18/2015   Procedure: TRANSESOPHAGEAL ECHOCARDIOGRAM (TEE);  Surgeon: [name], MD;  Location: [facility] OR;  Service: Cardiovascular;  Laterality: N/A;    There were no vitals filed for this visit.   Subjective Assessment - 07/04/20 1109    Subjective "The (website) is helpful." (SLP provided this last session)    Currently in Pain? No/denies                 ADULT SLP TREATMENT - 07/04/20 1111      General Information   Behavior/Cognition Alert;Cooperative;Pleasant mood  Treatment Provided   Treatment provided Cognitive-Linquistic      Cognitive-Linquistic Treatment   Treatment focused on Dysarthria    Skilled Treatment In 8 minutes conversation pt used speech compensations approx 75% of the time. SLP asked pt if he would like to have some practice for his dysarthria and pt stated he owuld so SLP provided him "tongue twisters" and reviewed how to practice these (see "pt instrctions"). SLP reiterated pt hsould focus on overarticualtion whenever he talks with his wife, and should record speech homework sessinos to listen back to. Pt answered  some simple questions with 1-2 sentence answers with 80% usage of compensations, with pt self-correcting 85% of the time if articulation too dysarthric.      Assessment / Recommendations / Plan   Plan Continue with current plan of care      Progression Toward Goals   Progression toward goals Progressing toward goals            SLP Education - 07/04/20 1258    Education Details dysarthria HEP (exaggerated and strong articulation with tongue twisters)    Person(s) Educated Patient    Methods Explanation;Demonstration;Handout    Comprehension Verbalized understanding;Returned demonstration;Verbal cues required;Need further instruction            SLP Short Term Goals - 07/04/20 1324      SLP SHORT TERM GOAL #1   Title pt will undergo objective swallow assessment    Status On-going      SLP SHORT TERM GOAL #2   Title pt will demonstrate dysarthria HEP with rare min A over 3 sessions    Period --   or 9 sessions, for all STGs   Status Partially Met      SLP SHORT TERM GOAL #3   Title pt will demonstrage voice HEP with rare min A over 3 sessions    Baseline 06-25-20, 07-02-20    Time 2    Period Weeks    Status On-going      SLP SHORT TERM GOAL #4   Title pt will engage in 8 mintues conversation maintaining gentle voice in 3 sesions, to limit possibility of habitualizing muscle tension dysphonia    Baseline 07-02-20    Time 2    Period Weeks    Status On-going      SLP SHORT TERM GOAL #5   Title pt will demonstrate speech compensations in sentence responses 80% of the time, in 3 sesssions    Baseline 07-02-20    Time 2    Period Weeks    Status On-going            SLP Long Term Goals - 07/04/20 1324      SLP LONG TERM GOAL #1   Title pt will demonstrate voice HEP with modified indpendnece in 3 sessions    Time 6    Period Weeks   or 17 sessions, for all LTGs   Status On-going      SLP LONG TERM GOAL #2   Title pt will engage in 12 mintues conversation  maintaining gentle voice 80% of the time, to limit possibility of habitualizing muscle tension dysphonia    Time 6    Period Weeks    Status On-going      SLP LONG TERM GOAL #3   Title pt will engage in 12 mintues mod complex conversation using speech strategies 80% of the time, in 3 sessions    Time 6    Period Weeks  Status On-going      SLP LONG TERM GOAL #4   Title pt will demonstrate improved score on Voice-related quality of life (QOL) measure (indicating better QOL) from therapy session #1    Time 6    Period Weeks    Status On-going            Plan - 07/04/20 1319    Clinical Impression Statement Rodney "Marden Noble" presents today with mild flaccid dysarthria, and 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 Pt denies overt s/s aspiration PNA, and rare coughing with liquids - he remains more mindful about how he swallows liquids. Pt has been working with [employer] exercises at home. Skilled ST is necessary to improve pt's swallowing, speech, adn voice quality. If pt's voice does not show improvement a referral to ENT may be necessary.    Speech Therapy Frequency 2x / week    Duration 8 weeks   17 visits   Treatment/Interventions Aspiration precaution training;Pharyngeal strengthening exercises;Diet toleration management by SLP;Trials of upgraded texture/liquids;Cueing hierarchy;SLP instruction and feedback;Functional tasks;Oral motor exercises;Compensatory strategies;Patient/family education;Internal/external aids;Other (comment)           Patient will benefit from skilled therapeutic intervention in order to improve the following deficits and impairments:   Dysarthria and anarthria  Dysphagia, unspecified type  Voice hoarseness    Problem List Patient Active Problem List   Diagnosis Date Noted  . Stroke ([place]) 05/16/2020  . Acute focal neurological deficit 05/15/2020  . BPH (benign prostatic hyperplasia) 02/02/2016  .  Fractured atrial pacemaker lead wire 07/18/2015  . 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 Chest pain 08/02/2013  . SOB (shortness of breath) 08/02/2013  . PVC (premature ventricular contraction) 02/13/2013  . Chest pain on exertion 02/25/2012  . Pacemaker-dual-chamber Medtronic 04/29/2011  . Change in bowel habits 01/22/2011  . Weight loss 01/22/2011  . COUGH 09/01/2010  . Multiple sclerosis ([place])   . HOCM (hypertrophic obstructive cardiomyopathy) ([place])   . Right bundle branch block   . Complete heart block ([place]) 12/05/2007    [place] ,[place], [place]  07/04/2020, 1:24 PM  [facility] [address] [place], [place], [address] Phone: [phone]   Fax:  [phone]   Name: Rodney Adkins MRN: [id number] Date of Birth: [birth year]

## 2020-07-04 NOTE — Therapy (Signed)
[facility] [address] [place], [place], [address] Phone: [phone]   Fax:  [phone]  Physical Therapy Treatment  Patient Details  Name: Rodney Adkins MRN: [id number] Date of Birth: February 25, 1956 Referring Provider (PT): Otto Herb, MD   Encounter Date: 07/04/2020   PT End of Session - 07/04/20 1019    Visit Number 13    Number of Visits 17    Authorization Type BCBS PPO - FOTO patient    PT Start Time 1017    PT Stop Time 1100    PT Time Calculation (min) 43 min    Equipment Utilized During Treatment Gait belt    Activity Tolerance Patient tolerated treatment well    Behavior During Therapy WFL for tasks assessed/performed           Past Medical History:  Diagnosis Date  . Complete heart block [facility]) June 2009   a. Presented with hypotension/pulm edema 12/2007 when HOCM was also discovered. s/p dual chamber MDT pacemaker. Thallium stest 7/09 negative for infiltrative disease or ischemia.  Followed Dr [name]  Marland Kitchen Heart murmur   . HOCM (hypertrophic obstructive cardiomyopathy) ([place]) 2009   Discovered by ECHO when he developed CHB. Mgmt Dr Caryl Comes  and Dr [name] at [facility]. Has outflow obstruction on ECHO 2011  . Multiple sclerosis ([place]) 1998   Sxs started 1998 with L body numbness. MRI c/w MS. Started Avonex. Saw Dr [name] at [facility] and switched to Betaseron and was on for 5 yrs but developed depression and site rxns and D/C'd 2006. While on Betaseron, occassionally required solumedrol for flares.  . Pacemaker 2017  . Prostate enlargement   . Right bundle branch block   . SOB (shortness of breath)    a. PFTs 09/2010 essentially normal per pulm note (mild obst defect). b. 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  . 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  Pacer reprogrammed with improvement in sx.    Past Surgical History:  Procedure Laterality Date  . LEFT HEART CATHETERIZATION WITH CORONARY ANGIOGRAM N/A 08/22/2013   Procedure: LEFT HEART CATHETERIZATION WITH CORONARY ANGIOGRAM;  Surgeon: [name], MD;  Location: [facility] CATH LAB;  Service: Cardiovascular;  Laterality: N/A;  . LEG TENDON SURGERY  2008   Os peroneus resection and peroneal tendon repair  . pace Agricultural engineer    . PACEMAKER INSERTION  12/2007   Complete heart block  . PACEMAKER LEAD REMOVAL Left 07/18/2015   Procedure: ATRIAL PACEMAKER LEAD EXTRACTION; ATRIAL PACEMAKER LEAD INSERTION; GENERATOR REPLACEMENT.;  Surgeon: [name], MD;  Location: [facility];  Service: Cardiovascular;  Laterality: Left;  [name] to back up  . PERIPHERAL VASCULAR CATHETERIZATION N/A 02/12/2015   Procedure: Venogram;  Surgeon: [name], MD;  Location: [facility] [place] CV LAB;  Service: Cardiovascular;  Laterality: N/A;  . TEE WITHOUT CARDIOVERSION N/A 07/18/2015   Procedure: TRANSESOPHAGEAL ECHOCARDIOGRAM (TEE);  Surgeon: [name], MD;  Location: [facility] OR;  Service: Cardiovascular;  Laterality: N/A;    There were no vitals filed for this visit.   Subjective Assessment - 07/04/20 1020    Subjective Patient reports no new changes or complaints. Reports that toe has been curling this morning.    Patient is accompained by: Family member    Pertinent History MS, CHB s/p PPM, HOCM  Limitations Walking;Writing;Standing;Lifting;House hold activities    Patient Stated Goals wants to be able to walk better.    Currently in Pain? No/denies                 [facility] Adult PT Treatment/Exercise - 07/04/20 0001      Ambulation/Gait   Ambulation/Gait Yes    Ambulation/Gait Assistance 5: Supervision    Ambulation/Gait Assistance Details Completed ambulation in therapy gym with R Foot Up Brace donned. PT providing cues to relax RLE with ambulation. Completed dynamic gait activites with ambulation (see  high level balance for details)    Ambulation Distance (Feet) 300 Feet    Assistive device Other (Comment)    Gait Pattern Step-through pattern;Decreased hip/knee flexion - right    Ambulation Surface Level;Indoor      High Level Balance   High Level Balance Activities Head turns;Negotiating over obstacles    High Level Balance Comments Completed obstacle course with ambulation over complaint surface (blue mat) with and without head turns, followed by negotiating over orange hurdles, xompleted x 3 laps. 4th lap completed with addition of cognitive task (naming animals start with letter of alphabelt). Intermittent CGA required.      Neuro Re-ed    Neuro Re-ed Details  Completed alternating toe tap to cone with following step over to promote dynamic balance and improved SLS, completed x 4 laps down and back with intermittent CGA.      Exercises   Exercises Knee/Hip      Knee/Hip Exercises: Aerobic   Other Aerobic Completed SciFit x 6 minutes on level 2.5 with BLE only for improved strengthening and endurance training.               Balance Exercises - 07/04/20 0001      Balance Exercises: Standing   Standing Eyes Opened Narrow base of support (BOS);Head turns;Foam/compliant surface;Limitations    Standing Eyes Opened Limitations on 2 1" foam completed horizontal/vertical head turns 2 x 10 reps.    Standing Eyes Closed Narrow base of support (BOS);Foam/compliant surface;3 reps;30 secs;Limitations    Standing Eyes Closed Limitations on 2 1" foam, completed eyes closed 3 x 25 seconds. intermittent CGA required.    Stepping Strategy Anterior;Posterior;Lateral;Foam/compliant surface;Limitations    Stepping Strategy Limitations standing on 1" foam completed lateral and backward stepping strategy off/on foam, completing without UE support x 15 reps in bilateral directions. increased challenge with clearance of RLE on foam due to weakness/fatigue. Intermittent rest breaks required.                PT Short Term Goals - 06/19/20 1017      PT SHORT TERM GOAL #1   Title Pt will be independent with initial HEP.    Baseline Pt reports that he has been working on the exercises.    Time 4    Period Weeks    Status Achieved    Target Date 06/18/20      PT SHORT TERM GOAL #2   Title Pt will decrease 5xSTS to </= 15 seconds for decreased fall risk.    Baseline 22 s without UE support from standard arm chair 05/21/20. 06/19/20 15.79 sec from chair with hands and 17.37 sec without hands    Time 4    Period Weeks    Status Not Met    Target Date 06/18/20      PT SHORT TERM GOAL #3   Title Pt will increase gait speed to >/= 0.75 m/s for increased access  to community and decreased fall risk.    Baseline 0.65 m/s with RW 05/21/20. 0.68ms with walker and 0.759m with cane    Time 4    Period Weeks    Status Achieved    Target Date 06/18/20      PT SHORT TERM GOAL #4   Title Pt will increase Berg score by 4 points in order to decrease fall risk and increase static and dynamic balance by a clincal detectable change (4 points = CDC).    Baseline 45/56 ind mod fall risk 05/21/20. 06/19/20 Berg=51/56    Time 4    Period Weeks    Status Achieved    Target Date 06/18/20             PT Long Term Goals - 06/19/20 0940      PT LONG TERM GOAL #1   Title Pt will be independent with progressive HEP.    Baseline NA    Time 8    Period Weeks    Status New      PT LONG TERM GOAL #2   Title Pt will decrease 5xSTS to </= 12 seconds to no longer be classified as fall risk and to be WNL for age and gender.    Baseline 22 s from standard arm chair without UE support. 06/19/20 17.37 sec    Time 8    Period Weeks    Status New      PT LONG TERM GOAL #3   Title Pt will reach >52/56 on Berg balance score for improved static and dynamic balance and decreased risk for falls.    Baseline 45/56 (mod fall risk) 05/21/20. 06/19/20 51/56    Time 8    Period Weeks    Status Revised       PT LONG TERM GOAL #4   Title Pt will reach 1.0 m/s gait speed for community ambulation and decreased fall risk.    Baseline 0.65 m/s. 06/19/20 0.9816mwith walker and 0.5m87mith cane    Time 8    Period Weeks    Status New      PT LONG TERM GOAL #5   Title Pt will ambulated 800+ feet on outdoor/unlevel surfaces with supervision and LRAD.    Baseline NT    Time 8    Period Weeks    Status New      PT LONG TERM GOAL #6   Title Pt will increase FOTO from 46 to >/= 70 for increased confidence in abilities.    Baseline 46 05/21/20    Time 8    Period Weeks    Status New                 Plan - 07/04/20 1115    Clinical Impression Statement Continued activities with foot up brace donned during session. Completed ambulation with dynamic gait activities including scanning environment with head turns and negotiating over obstacles, as well as continued balance working on complaint surfaces. Intermittent rest breaks required due to fatigue. Will continue to progress toward all LTGs.    Personal Factors and Comorbidities Comorbidity 3+;Age    Comorbidities MS, CHB s/p PPM, HOCM    Examination-Activity Limitations Squat;Locomotion Level;Transfers;Stairs;Stand    Examination-Participation Restrictions Yard Work;Occupation;Driving;Community Activity    Stability/Clinical Decision Making Evolving/Moderate complexity    Rehab Potential Good    PT Frequency 2x / week    PT Duration 8 weeks    PT Treatment/Interventions Gait training;Stair training;Functional mobility training;Therapeutic activities;Therapeutic  exercise;Balance training;Neuromuscular re-education;Patient/family education;Orthotic Fit/Training;DME Instruction;ADLs/Self Care Home Management;Energy conservation;Manual techniques;Vestibular    PT Next Visit Plan gait training with SPC with quad tip/walking stick (scanning, obstacles, over unlevel surfaces). corner balance exercises and on compliant surfaces. RLE strengthening,  PT gave info on right foot up brace if pt decides to get in the future.    Consulted and Agree with Plan of Care Patient;Family member/caregiver    Family Member Consulted Wife           Patient will benefit from skilled therapeutic intervention in order to improve the following deficits and impairments:  Abnormal gait,Decreased coordination,Difficulty walking,Decreased endurance,Decreased activity tolerance,Impaired perceived functional ability,Decreased balance,Decreased mobility,Decreased strength,Improper body mechanics  Visit Diagnosis: Muscle weakness (generalized)  Hemiplegia and hemiparesis following cerebral infarction affecting right dominant side ([place])  Other abnormalities of gait and mobility     Problem List Patient Active Problem List   Diagnosis Date Noted  . Stroke ([place]) 05/16/2020  . Acute focal neurological deficit 05/15/2020  . BPH (benign prostatic hyperplasia) 02/02/2016  . Fractured atrial pacemaker lead wire 07/18/2015  . 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 Chest pain 08/02/2013  . SOB (shortness of breath) 08/02/2013  . PVC (premature ventricular contraction) 02/13/2013  . Chest pain on exertion 02/25/2012  . Pacemaker-dual-chamber Medtronic 04/29/2011  . Change in bowel habits 01/22/2011  . Weight loss 01/22/2011  . COUGH 09/01/2010  . Multiple sclerosis ([place])   . HOCM (hypertrophic obstructive cardiomyopathy) ([place])   . Right bundle branch block   . Complete heart block ([place]) 12/05/2007    Jones Bales, PT, DPT 07/04/2020, 11:17 AM  [facility] [address] [place], [place], [address] Phone: [phone]   Fax:  [phone]  Name: MARSH HECKLER MRN: [id number] Date of Birth: [birth year]

## 2020-07-04 NOTE — Patient Instructions (Signed)
   WHENEVER you talk with your wife, think "OPEN MOUTH" - and this will naturally make you overarticulate.  Practice the phrases/sentences I provided today (below) with greater strength of articulation  Use a tape recorder to tape your practice and listen to approx 50% of your responses.    Speech Exercises  Repeat 2 times, 2-3 times a day Say each with forceful/strong articulation  Call the cat "Buttercup" A calendar of [place] Four floors to cover Yellow oil ointment Fellow lovers of felines Catastrophe in [place] Plump plumbers' plums The church's chimes chimed Telling time 'til eleven Five valve levers Keep the gate closed Go see that guy Fat cows give milk [employer] Data Gophers Fat frogs flip freely [employer] into bed Get that game to [employer] Thick thistles stick together Cinnamon aluminum linoleum Black bugs blood Lovely lemon linament Red leather, yellow leather  Big grocery buggy    Purple baby carriage [facility] Proper copper coffee pot Ripe purple cabbage Three free throws [employer] tackled  [employer] dipped the dessert  [employer] Buckle that [employer] of [employer] Shirts shrink, shells shouldn't Flippin 49ers Take the tackle box File the flash message Give me five flapjacks Fundamental relatives Dye the pets purple Talking [place] time after time Dark chocolate chunks Political landscape of the kingdom Actuary genius We played yo-yos yesterday

## 2020-07-04 NOTE — Therapy (Signed)
[facility] [address] [place], [place], [address] Phone: [phone]   Fax:  [phone]  Occupational Therapy Treatment  Patient Details  Name: Rodney Adkins MRN: [id number] Date of Birth: [birth year] Referring Provider (OT): Darliss Cheney, MD   Encounter Date: 07/04/2020   OT End of Session - 07/04/20 1151    Visit Number 10    Number of Visits 17    Date for OT Re-Evaluation 07/19/20    Authorization Type State BCBS    Authorization Time Period VL:MN Week 5 of 8 (12/30)    OT Start Time 1150    OT Stop Time 1230    OT Time Calculation (min) 40 min    Activity Tolerance Patient tolerated treatment well    Behavior During Therapy [facility] for tasks assessed/performed           Past Medical History:  Diagnosis Date  . Complete heart block [facility]) June 2009   a. Presented with hypotension/pulm edema 12/2007 when HOCM was also discovered. s/p dual chamber MDT pacemaker. Thallium stest 7/09 negative for infiltrative disease or ischemia.  Followed Dr [name]  Marland Kitchen Heart murmur   . HOCM (hypertrophic obstructive cardiomyopathy) ([place]) 2009   Discovered by ECHO when he developed CHB. Mgmt Dr Caryl Comes  and Dr [name] at [facility]. Has outflow obstruction on ECHO 2011  . Multiple sclerosis ([place]) 1998   Sxs started 1998 with L body numbness. MRI c/w MS. Started Avonex. Saw Dr [name] at [facility] and switched to Betaseron and was on for 5 yrs but developed depression and site rxns and D/C'd 2006. While on Betaseron, occassionally required solumedrol for flares.  . Pacemaker 2017  . Prostate enlargement   . Right bundle branch block   . SOB (shortness of breath)    a. PFTs 09/2010 essentially normal per pulm note (mild obst defect). b. 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  . Wenckebach    a. Decreased ex tolerance 2010/2012 - underwent stress echo to look @ effects of MV/LVOT - nothing noted; although pt had high rate Wenckebach  behavior with associated ex intol. Pacer reprogrammed with improvement in sx.    Past Surgical History:  Procedure Laterality Date  . LEFT HEART CATHETERIZATION WITH CORONARY ANGIOGRAM N/A 08/22/2013   Procedure: LEFT HEART CATHETERIZATION WITH CORONARY ANGIOGRAM;  Surgeon: [name], MD;  Location: [facility] CATH LAB;  Service: Cardiovascular;  Laterality: N/A;  . LEG TENDON SURGERY  2008   Os peroneus resection and peroneal tendon repair  . pace Agricultural engineer    . PACEMAKER INSERTION  12/2007   Complete heart block  . PACEMAKER LEAD REMOVAL Left 07/18/2015   Procedure: ATRIAL PACEMAKER LEAD EXTRACTION; ATRIAL PACEMAKER LEAD INSERTION; GENERATOR REPLACEMENT.;  Surgeon: [name], MD;  Location: [facility];  Service: Cardiovascular;  Laterality: Left;  [name] to back up  . PERIPHERAL VASCULAR CATHETERIZATION N/A 02/12/2015   Procedure: Venogram;  Surgeon: [name], MD;  Location: [place] CV LAB;  Service: Cardiovascular;  Laterality: N/A;  . TEE WITHOUT CARDIOVERSION N/A 07/18/2015   Procedure: TRANSESOPHAGEAL ECHOCARDIOGRAM (TEE);  Surgeon: [name], MD;  Location: [facility] OR;  Service: Cardiovascular;  Laterality: N/A;    There were no vitals filed for this visit.                 OT Treatments/Exercises (OP) - 07/04/20 1156      Hand Exercises   Other Hand Exercises Digi Flex 5.0 lbs x  10 reps      Visual/Perceptual Exercises   Copy this Image Pegboard    Pegboard no difficulty with following pattern      Fine Motor Coordination (Hand/Wrist)   Fine Motor Coordination Handwriting;Tracing;Small Pegboard    In Hand Manipulation Training removed small pegs with in hand manipulation    Small Pegboard RUE small pegs with min difficulty with coordination    Handwriting RUE handwriting with coban wrapped pen with writing last and first name with 100% legibility and good size and A-Z sentence (quick brown fox) with good spacing, legibility and letter formation and size. Pt  with micrographia at end of sentence. lower case l's across page with encouragement to keep one fluid movement. Pt worked on Medical sales representative for increasing visual motor integration and positioning of pen and paper. distal finger control with RUE and with less than fluid movements with pen. Pt did not deviate from area.    Tracing tracing activity with RUE for tracing from A to B with loops with emphasis on keeping pen on paper and not lifting. Pt lifted pen x 3 and turning paper    Grooved pegs RUE with min difficulty and removing with tweezers                    OT Short Term Goals - 07/02/20 0915      OT SHORT TERM GOAL #1   Title Pt will be independent with HEP 06/21/20    Time 4    Period Weeks    Status Achieved    Target Date 06/21/20      OT SHORT TERM GOAL #2   Title Pt will demonstrate improved fine motor coordination by demonstrating decrease in 9 hole peg test score by 6 seconds with RUE.    Baseline RUE 71.06    Time 4    Period Weeks    Status Achieved      OT SHORT TERM GOAL #3   Title Pt will perform simple warm meal prep or home management task with supervision and good safety awareness.    Time 4    Period Weeks    Status On-going      OT SHORT TERM GOAL #4   Title Pt will write 2-3 sentences with 75% legibility and increased size of letters (mild micrographia).    Baseline severe micrographia    Time 4    Period Weeks    Status Achieved   3 sentences with good letter size and legibility     OT SHORT TERM GOAL #5   Title Pt will demonstrate increase in grip strength by 5 lbs in RUE for increased ease with ADLs and clothing management, etc.    Baseline RUE 45.4 (LUE 65.6)    Time 4    Period Weeks    Status Achieved   RUE 55.7 lbs     OT SHORT TERM GOAL #6   Title Pt will report using RUE (dominant hand) for 25% or more of functional tasks.    Time 4    Period Weeks    Status On-going             OT Long Term Goals - 06/27/20 1253      OT  LONG TERM GOAL #1   Title Pt will be independent with updated HEP 07/19/2020    Time 8    Period Weeks    Status New      OT LONG TERM GOAL #2  Title Pt will demonstrate improved fine motor coordination by demonstrating decrease in 9 hole peg test score by 15 seconds with RUE.    Time 8    Period Weeks    Status Achieved      OT LONG TERM GOAL #3   Title Pt will perform work simulated tasks (typing, using mouse, etc) with RUE as dominant hand with 90% accuracy.    Time 8    Period Weeks    Status New      OT LONG TERM GOAL #4   Title Pt will report completing at least 1 home management task per day with mod I in order to decrease caregiver burden.    Time 8    Period Weeks    Status New      OT LONG TERM GOAL #5   Title Pt will improve grip strength by at least 10 lbs in RUE for strengthening dominant hand for functional use and tasks.    Baseline RUE 45.4 (LUE 65.6)    Time 8    Period Weeks    Status New      OT LONG TERM GOAL #6   Title Pt will report using RUE (dominant hand) for at least 75% of functional tasks per day.    Time 8    Period Weeks    Status New      OT LONG TERM GOAL #7   Title Pt will perform physical and cognitive tasks simultaneously with 95% accuracy for returning to prior tasks.    Time 8    Period Weeks    Status New      OT LONG TERM GOAL #8   Title Pt will write 3-5 sentences with 90% legibility and appropriate sizing.    Time 8    Period Weeks    Status New                 Plan - 07/04/20 1152    Clinical Impression Statement Pt is making progress. Pt has met 4/6 STGs and is working towards [place]. Pt has made steady progress with fine motor coordination and strength in RUE    OT Occupational Profile and History Detailed Assessment- Review of Records and additional review of physical, cognitive, psychosocial history related to current functional performance    Occupational performance deficits (Please refer to evaluation for  details): IADL's;ADL's;Social Participation;Leisure;Work;Rest and Sleep    Body Structure / Function / Physical Skills ADL;Dexterity;Decreased knowledge of use of DME;Strength;UE functional use;Endurance;IADL;Coordination;FMC    Rehab Potential Good    Clinical Decision Making Limited treatment options, no task modification necessary    Comorbidities Affecting Occupational Performance: None    Modification or Assistance to Complete Evaluation  No modification of tasks or assist necessary to complete eval    OT Frequency 2x / week    OT Duration 8 weeks   or 16 visits (+ eval) over extended time   OT Treatment/Interventions Self-care/ADL training;DME and/or AE instruction;Gait Training;Therapeutic activities;Balance training;Cognitive remediation/compensation;Therapeutic exercise;Neuromuscular education;Passive range of motion;Visual/perceptual remediation/compensation;Patient/family education;Energy conservation    Plan simple cooking task, check short term goals    OT Home Exercise Plan coordination HEP    Consulted and Agree with Plan of Care Patient;Family member/caregiver   spouse   Family Member Consulted spouse           Patient will benefit from skilled therapeutic intervention in order to improve the following deficits and impairments:   Body Structure / Function / Physical Skills: ADL,Dexterity,Decreased  knowledge of use of DME,Strength,UE functional use,Endurance,IADL,Coordination,FMC       Visit Diagnosis: Muscle weakness (generalized)  Hemiplegia and hemiparesis following cerebral infarction affecting right dominant side (HCC)  Other lack of coordination  Attention and concentration deficit  Other abnormalities of gait and mobility    Problem List Patient Active Problem List   Diagnosis Date Noted  . Stroke ([place]) 05/16/2020  . Acute focal neurological deficit 05/15/2020  . BPH (benign prostatic hyperplasia) 02/02/2016  . Fractured atrial pacemaker lead wire  07/18/2015  . 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 Chest pain 08/02/2013  . SOB (shortness of breath) 08/02/2013  . PVC (premature ventricular contraction) 02/13/2013  . Chest pain on exertion 02/25/2012  . Pacemaker-dual-chamber Medtronic 04/29/2011  . Change in bowel habits 01/22/2011  . Weight loss 01/22/2011  . COUGH 09/01/2010  . Multiple sclerosis ([place])   . HOCM (hypertrophic obstructive cardiomyopathy) ([place])   . Right bundle branch block   . Complete heart block ([place]) 12/05/2007    Zachery Conch MOT, OTR/L  07/04/2020, 12:27 PM  [facility] [address] [place], [place], [address] Phone: [phone]   Fax:  [phone]  Name: Rodney Adkins MRN: [id number] Date of Birth: Oct 21, 1955

## 2020-07-09 ENCOUNTER — Encounter: Payer: Self-pay | Admitting: Occupational Therapy

## 2020-07-09 ENCOUNTER — Ambulatory Visit: Payer: BC Managed Care – PPO

## 2020-07-09 ENCOUNTER — Other Ambulatory Visit: Payer: Self-pay

## 2020-07-09 ENCOUNTER — Ambulatory Visit: Payer: BC Managed Care – PPO | Admitting: Occupational Therapy

## 2020-07-09 ENCOUNTER — Ambulatory Visit: Payer: BC Managed Care – PPO | Attending: Family Medicine

## 2020-07-09 DIAGNOSIS — R41840 Attention and concentration deficit: Secondary | ICD-10-CM | POA: Diagnosis present

## 2020-07-09 DIAGNOSIS — R278 Other lack of coordination: Secondary | ICD-10-CM | POA: Insufficient documentation

## 2020-07-09 DIAGNOSIS — R2689 Other abnormalities of gait and mobility: Secondary | ICD-10-CM | POA: Diagnosis not present

## 2020-07-09 DIAGNOSIS — I69351 Hemiplegia and hemiparesis following cerebral infarction affecting right dominant side: Secondary | ICD-10-CM | POA: Diagnosis present

## 2020-07-09 DIAGNOSIS — R471 Dysarthria and anarthria: Secondary | ICD-10-CM | POA: Insufficient documentation

## 2020-07-09 DIAGNOSIS — M6281 Muscle weakness (generalized): Secondary | ICD-10-CM

## 2020-07-09 DIAGNOSIS — R490 Dysphonia: Secondary | ICD-10-CM | POA: Diagnosis present

## 2020-07-09 DIAGNOSIS — R1310 Dysphagia, unspecified: Secondary | ICD-10-CM

## 2020-07-09 NOTE — Therapy (Signed)
[facility] [address] [place], [place], [address] Phone: [phone]   Fax:  [phone]  Physical Therapy Treatment  Patient Details  Name: Rodney Adkins MRN: [id number] Date of Birth: 1956/02/19 Referring Provider (PT): Otto Herb, MD   Encounter Date: 07/09/2020   PT End of Session - 07/09/20 1022    Visit Number 14    Number of Visits 17    Authorization Type BCBS PPO - FOTO patient    PT Start Time 1020    PT Stop Time 1100    PT Time Calculation (min) 40 min    Equipment Utilized During Treatment Gait belt    Activity Tolerance Patient tolerated treatment well    Behavior During Therapy WFL for tasks assessed/performed           Past Medical History:  Diagnosis Date  . Complete heart block [facility]) June 2009   a. Presented with hypotension/pulm edema 12/2007 when HOCM was also discovered. s/p dual chamber MDT pacemaker. Thallium stest 7/09 negative for infiltrative disease or ischemia.  Followed Dr [name]  Marland Kitchen Heart murmur   . HOCM (hypertrophic obstructive cardiomyopathy) ([place]) 2009   Discovered by ECHO when he developed CHB. Mgmt Dr Caryl Comes  and Dr [name] at [facility]. Has outflow obstruction on ECHO 2011  . Multiple sclerosis ([place]) 1998   Sxs started 1998 with L body numbness. MRI c/w MS. Started Avonex. Saw Dr [name] at [facility] and switched to Betaseron and was on for 5 yrs but developed depression and site rxns and D/C'd 2006. While on Betaseron, occassionally required solumedrol for flares.  . Pacemaker 2017  . Prostate enlargement   . Right bundle branch block   . SOB (shortness of breath)    a. PFTs 09/2010 essentially normal per pulm note (mild obst defect). b. 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  . 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 Pacer  reprogrammed with improvement in sx.    Past Surgical History:  Procedure Laterality Date  . LEFT HEART CATHETERIZATION WITH CORONARY ANGIOGRAM N/A 08/22/2013   Procedure: LEFT HEART CATHETERIZATION WITH CORONARY ANGIOGRAM;  Surgeon: [name], MD;  Location: [facility] CATH LAB;  Service: Cardiovascular;  Laterality: N/A;  . LEG TENDON SURGERY  2008   Os peroneus resection and peroneal tendon repair  . pace Agricultural engineer    . PACEMAKER INSERTION  12/2007   Complete heart block  . PACEMAKER LEAD REMOVAL Left 07/18/2015   Procedure: ATRIAL PACEMAKER LEAD EXTRACTION; ATRIAL PACEMAKER LEAD INSERTION; GENERATOR REPLACEMENT.;  Surgeon: [name], MD;  Location: [facility];  Service: Cardiovascular;  Laterality: Left;  [name] to back up  . PERIPHERAL VASCULAR CATHETERIZATION N/A 02/12/2015   Procedure: Venogram;  Surgeon: [name], MD;  Location: [facility] CV LAB;  Service: Cardiovascular;  Laterality: N/A;  . TEE WITHOUT CARDIOVERSION N/A 07/18/2015   Procedure: TRANSESOPHAGEAL ECHOCARDIOGRAM (TEE);  Surgeon: [name], MD;  Location: [facility] OR;  Service: Cardiovascular;  Laterality: N/A;    There were no vitals filed for this visit.   Subjective Assessment - 07/09/20 1023    Subjective Pt reports that he does feel he is making progress but not where he was before the stroke. Pt reports that he still has to use walking stick, not as easy getting up and down and  doesn't get on floor anymore.    Patient is accompained by: Family member    Pertinent History MS, CHB s/p PPM, HOCM    Limitations Walking;Writing;Standing;Lifting;House hold activities    Patient Stated Goals wants to be able to walk better.    Currently in Pain? No/denies                             [facility] Adult PT Treatment/Exercise - 07/09/20 1024      Transfers   Transfers Sit to Stand;Stand to Sit    Sit to Stand 6: Modified independent (Device/Increase time)    Five time sit to stand comments  16.62 sec  without hands from chair    Stand to Sit 6: Modified independent (Device/Increase time)      Ambulation/Gait   Ambulation/Gait Yes    Ambulation/Gait Assistance 5: Supervision    Ambulation/Gait Assistance Details Pt was cued to increase right hip flexion.    Ambulation Distance (Feet) 300 Feet    Assistive device --   walking stick   Gait Pattern Step-through pattern;Decreased hip/knee flexion - right    Ambulation Surface Level;Indoor      Standardized Balance Assessment   Standardized Balance Assessment Berg Balance Test      Berg Balance Test   Sit to Stand Able to stand without using hands and stabilize independently    Standing Unsupported Able to stand safely 2 minutes    Sitting with Back Unsupported but Feet Supported on Floor or Stool Able to sit safely and securely 2 minutes    Stand to Sit Sits safely with minimal use of hands    Transfers Able to transfer safely, minor use of hands    Standing Unsupported with Eyes Closed Able to stand 10 seconds safely    Standing Ubsupported with Feet Together Able to place feet together independently and stand 1 minute safely    From Standing, Reach Forward with Outstretched Arm Can reach confidently >25 cm (10")    From Standing Position, Pick up Object from Floor Able to pick up shoe safely and easily    From Standing Position, Turn to Look Behind Over each Shoulder Looks behind from both sides and weight shifts well    Turn 360 Degrees Able to turn 360 degrees safely one side only in 4 seconds or less    Standing Unsupported, Alternately Place Feet on Step/Stool Able to stand independently and safely and complete 8 steps in 20 seconds    Standing Unsupported, One Foot in Front Able to place foot tandem independently and hold 30 seconds    Standing on One Leg Able to lift leg independently and hold equal to or more than 3 seconds   left leg >10 sec   Total Score 53      Neuro Re-ed    Neuro Re-ed Details  Gait over obstacle course  with walking stick: stepping on/off black foam beam then reciproal steps over 5 cones x 6 bouts CGA. Standing on soft blue foam beam stepping tapping LLE forward and back x 10 to increase SLS time CGA with walking stick support. Side stepping over 5 cones x 2 bouts CGA.                  PT Education - 07/09/20 1112    Education Details Discussed results of Berg and 5 x sit to stand testing and recert plan for next visit.  Person(s) Educated Patient    Methods Explanation    Comprehension Verbalized understanding            PT Short Term Goals - 06/19/20 4403      PT SHORT TERM GOAL #1   Title Pt will be independent with initial HEP.    Baseline Pt reports that he has been working on the exercises.    Time 4    Period Weeks    Status Achieved    Target Date 06/18/20      PT SHORT TERM GOAL #2   Title Pt will decrease 5xSTS to </= 15 seconds for decreased fall risk.    Baseline 22 s without UE support from standard arm chair 05/21/20. 06/19/20 15.79 sec from chair with hands and 17.37 sec without hands    Time 4    Period Weeks    Status Not Met    Target Date 06/18/20      PT SHORT TERM GOAL #3   Title Pt will increase gait speed to >/= 0.75 m/s for increased access to community and decreased fall risk.    Baseline 0.65 m/s with RW 05/21/20. 0.2ms with walker and 0.788m with cane    Time 4    Period Weeks    Status Achieved    Target Date 06/18/20      PT SHORT TERM GOAL #4   Title Pt will increase Berg score by 4 points in order to decrease fall risk and increase static and dynamic balance by a clincal detectable change (4 points = CDC).    Baseline 45/56 ind mod fall risk 05/21/20. 06/19/20 Berg=51/56    Time 4    Period Weeks    Status Achieved    Target Date 06/18/20             PT Long Term Goals - 07/09/20 1039      PT LONG TERM GOAL #1   Title Pt will be independent with progressive HEP.    Baseline NA    Time 8    Period Weeks    Status  New      PT LONG TERM GOAL #2   Title Pt will decrease 5xSTS to </= 12 seconds to no longer be classified as fall risk and to be WNL for age and gender.    Baseline 22 s from standard arm chair without UE support. 06/19/20 17.37 sec, 07/09/20 16.62 sec    Time 8    Period Weeks    Status On-going      PT LONG TERM GOAL #3   Title Pt will reach >52/56 on Berg balance score for improved static and dynamic balance and decreased risk for falls.    Baseline 45/56 (mod fall risk) 05/21/20. 06/19/20 51/56. 53/56 on 07/09/20    Time 8    Period Weeks    Status Achieved      PT LONG TERM GOAL #4   Title Pt will reach 1.0 m/s gait speed for community ambulation and decreased fall risk.    Baseline 0.65 m/s. 06/19/20 0.9872mwith walker and 0.9m56mith cane    Time 8    Period Weeks    Status New      PT LONG TERM GOAL #5   Title Pt will ambulated 800+ feet on outdoor/unlevel surfaces with supervision and LRAD.    Baseline NT    Time 8    Period Weeks    Status New  PT LONG TERM GOAL #6   Title Pt will increase FOTO from 46 to >/= 70 for increased confidence in abilities.    Baseline 46 05/21/20    Time 8    Period Weeks    Status New                 Plan - 07/09/20 1113    Clinical Impression Statement PT started assessing LTG as recert due end of week. Pt met Merrilee Jansky goal putting in low fall risk category with 53/56 score. Pt still challenged with activities increasing SLS time on RLE. Pt was able to decrease 5 x sit to stand time but short of goal. He continues to progress with gait using walking stick at this time. Pt will benefit from continued PT and plan to recert next visit.    Personal Factors and Comorbidities Comorbidity 3+;Age    Comorbidities MS, CHB s/p PPM, HOCM    Examination-Activity Limitations Squat;Locomotion Level;Transfers;Stairs;Stand    Examination-Participation Restrictions Yard Work;Occupation;Driving;Community Activity    Stability/Clinical Decision  Making Evolving/Moderate complexity    Rehab Potential Good    PT Frequency 2x / week    PT Duration 8 weeks    PT Treatment/Interventions Gait training;Stair training;Functional mobility training;Therapeutic activities;Therapeutic exercise;Balance training;Neuromuscular re-education;Patient/family education;Orthotic Fit/Training;DME Instruction;ADLs/Self Care Home Management;Energy conservation;Manual techniques;Vestibular    PT Next Visit Plan Check remaining LTGs and assess either DGI or FGA for new balance/gait goal. Recert for 2x/week for 4-8 weeks next visit. Will need to schedule more visits based on plan after January. gait training with SPC with quad tip/walking stick (scanning, obstacles, over unlevel surfaces). corner balance exercises and on compliant surfaces. RLE strengthening, PT gave info on right foot up brace if pt decides to get in the future.    Consulted and Agree with Plan of Care Patient;Family member/caregiver    Family Member Consulted Wife           Patient will benefit from skilled therapeutic intervention in order to improve the following deficits and impairments:  Abnormal gait,Decreased coordination,Difficulty walking,Decreased endurance,Decreased activity tolerance,Impaired perceived functional ability,Decreased balance,Decreased mobility,Decreased strength,Improper body mechanics  Visit Diagnosis: Other abnormalities of gait and mobility  Muscle weakness (generalized)  Hemiplegia and hemiparesis following cerebral infarction affecting right dominant side [facility])     Problem List Patient Active Problem List   Diagnosis Date Noted  . Stroke ([place]) 05/16/2020  . Acute focal neurological deficit 05/15/2020  . BPH (benign prostatic hyperplasia) 02/02/2016  . Fractured atrial pacemaker lead wire 07/18/2015  . 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 Chest pain 08/02/2013  . SOB (shortness of breath) 08/02/2013  . PVC (premature ventricular contraction) 02/13/2013   . Chest pain on exertion 02/25/2012  . Pacemaker-dual-chamber Medtronic 04/29/2011  . Change in bowel habits 01/22/2011  . Weight loss 01/22/2011  . COUGH 09/01/2010  . Multiple sclerosis ([place])   . HOCM (hypertrophic obstructive cardiomyopathy) ([place])   . Right bundle branch block   . Complete heart block ([place]) 12/05/2007    Electa Sniff, PT, DPT, NCS 07/09/2020, 11:16 AM  [facility] [address] [place], [place], [address] Phone: [phone]   Fax:  [phone]  Name: Rodney Adkins MRN: [id number] Date of Birth: [birth year]

## 2020-07-09 NOTE — Therapy (Signed)
[facility] [address] [place], [place], [address] Phone: [phone]   Fax:  [phone]  Occupational Therapy Treatment  Patient Details  Name: Rodney Adkins MRN: [id number] Date of Birth: 28-Dec-1955 Referring Provider (OT): Darliss Cheney, MD   Encounter Date: 07/09/2020   OT End of Session - 07/09/20 1106    Visit Number 11    Number of Visits 17    Date for OT Re-Evaluation 07/19/20    Authorization Type State BCBS    Authorization Time Period VL:[place] Week 6 of 8 (1/4)    OT Start Time 1105    OT Stop Time 1145    OT Time Calculation (min) 40 min    Activity Tolerance Patient tolerated treatment well    Behavior During Therapy [facility] for tasks assessed/performed           Past Medical History:  Diagnosis Date  . Complete heart block [facility]) June 2009   a. Presented with hypotension/pulm edema 12/2007 when HOCM was also discovered. s/p dual chamber MDT pacemaker. Thallium stest 7/09 negative for infiltrative disease or ischemia.  Followed Dr [name]  Marland Kitchen Heart murmur   . HOCM (hypertrophic obstructive cardiomyopathy) ([place]) 2009   Discovered by ECHO when he developed CHB. Mgmt Dr Caryl Comes  and Dr [name] at [facility]. Has outflow obstruction on ECHO 2011  . Multiple sclerosis ([place]) 1998   Sxs started 1998 with L body numbness. MRI c/w MS. Started Avonex. Saw Dr [name] at [facility] and switched to Betaseron and was on for 5 yrs but developed depression and site rxns and D/C'd 2006. While on Betaseron, occassionally required solumedrol for flares.  . Pacemaker 2017  . Prostate enlargement   . Right bundle branch block   . SOB (shortness of breath)    a. PFTs 09/2010 essentially normal per pulm note (mild obst defect). b. 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  . Wenckebach    a. Decreased ex tolerance 2010/2012 - underwent stress echo to look @ effects of MV/LVOT - nothing noted; although pt had high rate Wenckebach behavior  with associated ex intol. Pacer reprogrammed with improvement in sx.    Past Surgical History:  Procedure Laterality Date  . LEFT HEART CATHETERIZATION WITH CORONARY ANGIOGRAM N/A 08/22/2013   Procedure: LEFT HEART CATHETERIZATION WITH CORONARY ANGIOGRAM;  Surgeon: [name], MD;  Location: [facility] CATH LAB;  Service: Cardiovascular;  Laterality: N/A;  . LEG TENDON SURGERY  2008   Os peroneus resection and peroneal tendon repair  . pace Agricultural engineer    . PACEMAKER INSERTION  12/2007   Complete heart block  . PACEMAKER LEAD REMOVAL Left 07/18/2015   Procedure: ATRIAL PACEMAKER LEAD EXTRACTION; ATRIAL PACEMAKER LEAD INSERTION; GENERATOR REPLACEMENT.;  Surgeon: [name], MD;  Location: [place];  Service: Cardiovascular;  Laterality: Left;  [name] to back up  . PERIPHERAL VASCULAR CATHETERIZATION N/A 02/12/2015   Procedure: Venogram;  Surgeon: [name], MD;  Location: [place] CV LAB;  Service: Cardiovascular;  Laterality: N/A;  . TEE WITHOUT CARDIOVERSION N/A 07/18/2015   Procedure: TRANSESOPHAGEAL ECHOCARDIOGRAM (TEE);  Surgeon: [name], MD;  Location: [facility] OR;  Service: Cardiovascular;  Laterality: N/A;    There were no vitals filed for this visit.   Subjective Assessment - 07/09/20 1106    Subjective  Pt denies any pain or changes.    Patient is accompanied by: Family member   spouse   Pertinent History Fall Risk. Speech Deficits.  Limitations MS, CHB s/p PPM, HOCM    Patient Stated Goals right hand. 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 Return to driving recommendations.    Currently in Pain? No/denies                        OT Treatments/Exercises (OP) - 07/09/20 1111      Exercises   Exercises Theraputty      Hand Exercises   Other Hand Exercises Hand Gripper level 2 with picking up 1 inch blocks with RUE      Theraputty   Theraputty Hand- Locate Pegs obtaining pegs x 12 from red theraputty      Fine  Motor Coordination (Hand/Wrist)   Manipulation of small objects placing small beads on golf tees x 10 with RUE and with min drops. Removed with in hand manipulation and translation from palm to fingertips with min drops                  OT Education - 07/09/20 1144    Education Details upgraded to green theraputty    Person(s) Educated Patient    Methods Explanation    Comprehension Verbalized understanding            OT Short Term Goals - 07/09/20 1108      OT SHORT TERM GOAL #1   Title Pt will be independent with HEP 06/21/20    Time 4    Period Weeks    Status Achieved    Target Date 06/21/20      OT SHORT TERM GOAL #2   Title Pt will demonstrate improved fine motor coordination by demonstrating decrease in 9 hole peg test score by 6 seconds with RUE.    Baseline RUE 71.06    Time 4    Period Weeks    Status Achieved      OT SHORT TERM GOAL #3   Title Pt will perform simple warm meal prep or home management task with supervision and good safety awareness.    Time 4    Period Weeks    Status Achieved      OT SHORT TERM GOAL #4   Title Pt will write 2-3 sentences with 75% legibility and increased size of letters (mild micrographia).    Baseline severe micrographia    Time 4    Period Weeks    Status Achieved   3 sentences with good letter size and legibility     OT SHORT TERM GOAL #5   Title Pt will demonstrate increase in grip strength by 5 lbs in RUE for increased ease with ADLs and clothing management, etc.    Baseline RUE 45.4 (LUE 65.6)    Time 4    Period Weeks    Status Achieved   RUE 55.7 lbs     OT SHORT TERM GOAL #6   Title Pt will report using RUE (dominant hand) for 25% or more of functional tasks.    Time 4    Period Weeks    Status Achieved             OT Long Term Goals - 07/09/20 1108      OT LONG TERM GOAL #1   Title Pt will be independent with updated HEP 07/19/2020    Time 8    Period Weeks    Status On-going      OT  LONG TERM GOAL #2   Title Pt  will demonstrate improved fine motor coordination by demonstrating decrease in 9 hole peg test score by 15 seconds with RUE.    Time 8    Period Weeks    Status Achieved      OT LONG TERM GOAL #3   Title Pt will perform work simulated tasks (typing, using mouse, etc) with RUE as dominant hand with 90% accuracy.    Time 8    Period Weeks    Status On-going      OT LONG TERM GOAL #4   Title Pt will report completing at least 1 home management task per day with mod I in order to decrease caregiver burden.    Time 8    Period Weeks    Status Achieved      OT LONG TERM GOAL #5   Title Pt will improve grip strength by at least 10 lbs in RUE for strengthening dominant hand for functional use and tasks.    Baseline RUE 45.4 (LUE 65.6)    Time 8    Period Weeks    Status On-going   52.9 RUE     OT LONG TERM GOAL #6   Title Pt will report using RUE (dominant hand) for at least 75% of functional tasks per day.    Time 8    Period Weeks    Status On-going      OT LONG TERM GOAL #7   Title Pt will perform physical and cognitive tasks simultaneously with 95% accuracy for returning to prior tasks.    Time 8    Period Weeks    Status On-going      OT LONG TERM GOAL #8   Title Pt will write 3-5 sentences with 90% legibility and appropriate sizing.    Time 8    Period Weeks    Status On-going                 Plan - 07/09/20 1144    Clinical Impression Statement Pt is progressing towards goals. Pt has met all STGs and is progressing towards LTGs. Pt continues to have deficits with RUE grip strength and coordination impeding overall independence with ADLs and IADLs.    OT Occupational Profile and History Detailed Assessment- Review of Records and additional review of physical, cognitive, psychosocial history related to current functional performance    Occupational performance deficits (Please refer to evaluation for details): IADL's;ADL's;Social  Participation;Leisure;Work;Rest and Sleep    Body Structure / Function / Physical Skills ADL;Dexterity;Decreased knowledge of use of DME;Strength;UE functional use;Endurance;IADL;Coordination;FMC    Rehab Potential Good    Clinical Decision Making Limited treatment options, no task modification necessary    Comorbidities Affecting Occupational Performance: None    Modification or Assistance to Complete Evaluation  No modification of tasks or assist necessary to complete eval    OT Frequency 2x / week    OT Duration 8 weeks   or 16 visits (+ eval) over extended time   OT Treatment/Interventions Self-care/ADL training;DME and/or AE instruction;Gait Training;Therapeutic activities;Balance training;Cognitive remediation/compensation;Therapeutic exercise;Neuromuscular education;Passive range of motion;Visual/perceptual remediation/compensation;Patient/family education;Energy conservation    Plan dual task (phys/cog), check short term goals    OT Home Exercise Plan coordination HEP    Consulted and Agree with Plan of Care Patient;Family member/caregiver   spouse   Family Member Consulted spouse           Patient will benefit from skilled therapeutic intervention in order to improve the following deficits and impairments:   Body  Structure / Function / Physical Skills: ADL,Dexterity,Decreased knowledge of use of DME,Strength,UE functional use,Endurance,IADL,Coordination,FMC       Visit Diagnosis: Muscle weakness (generalized)  Hemiplegia and hemiparesis following cerebral infarction affecting right dominant side (HCC)  Other lack of coordination  Attention and concentration deficit  Other abnormalities of gait and mobility    Problem List Patient Active Problem List   Diagnosis Date Noted  . Stroke ([place]) 05/16/2020  . Acute focal neurological deficit 05/15/2020  . BPH (benign prostatic hyperplasia) 02/02/2016  . Fractured atrial pacemaker lead wire 07/18/2015  . 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 Chest pain 08/02/2013  . SOB (shortness of breath) 08/02/2013  . PVC (premature ventricular contraction) 02/13/2013  . Chest pain on exertion 02/25/2012  . Pacemaker-dual-chamber Medtronic 04/29/2011  . Change in bowel habits 01/22/2011  . Weight loss 01/22/2011  . COUGH 09/01/2010  . Multiple sclerosis ([place])   . HOCM (hypertrophic obstructive cardiomyopathy) ([place])   . Right bundle branch block   . Complete heart block ([place]) 12/05/2007    Rodney Adkins MOT, OTR/L  07/09/2020, 12:00 PM  [facility] [address] [place], [place], [address] Phone: [phone]   Fax:  [phone]  Name: Rodney Adkins MRN: [id number] Date of Birth: 01/14/1956

## 2020-07-10 NOTE — Therapy (Signed)
[facility] [address] [place], [place], [address] Phone: [phone]   Fax:  [phone]  Speech Language Pathology Treatment  Patient Details  Name: Rodney Adkins MRN: [id number] Date of Birth: March 26, 1956 Referring Provider (SLP): Darliss Cheney,  MD   Encounter Date: 07/09/2020   End of Session - 07/10/20 0009    Visit Number 8    Number of Visits 17    Date for SLP Re-Evaluation 09/10/20    SLP Start Time 0933    SLP Stop Time  1015    SLP Time Calculation (min) 42 min    Activity Tolerance Patient tolerated treatment well           Past Medical History:  Diagnosis Date  . Complete heart block [facility]) June 2009   a. Presented with hypotension/pulm edema 12/2007 when HOCM was also discovered. s/p dual chamber MDT pacemaker. Thallium stest 7/09 negative for infiltrative disease or ischemia.  Followed Dr [name]  Marland Kitchen Heart murmur   . HOCM (hypertrophic obstructive cardiomyopathy) ([place]) 2009   Discovered by ECHO when he developed CHB. Mgmt Dr Caryl Comes  and Dr [name] at [facility]. Has outflow obstruction on ECHO 2011  . Multiple sclerosis ([place]) 1998   Sxs started 1998 with L body numbness. MRI c/w MS. Started Avonex. Saw Dr [name] at [facility] and switched to Betaseron and was on for 5 yrs but developed depression and site rxns and D/C'd 2006. While on Betaseron, occassionally required solumedrol for flares.  . Pacemaker 2017  . Prostate enlargement   . Right bundle branch block   . SOB (shortness of breath)    a. PFTs 09/2010 essentially normal per pulm note (mild obst defect). b. 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  . 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 Pacer reprogrammed with improvement in sx.    Past Surgical History:  Procedure Laterality Date  . LEFT  HEART CATHETERIZATION WITH CORONARY ANGIOGRAM N/A 08/22/2013   Procedure: LEFT HEART CATHETERIZATION WITH CORONARY ANGIOGRAM;  Surgeon: [name], MD;  Location: [facility] CATH LAB;  Service: Cardiovascular;  Laterality: N/A;  . LEG TENDON SURGERY  2008   Os peroneus resection and peroneal tendon repair  . pace Agricultural engineer    . PACEMAKER INSERTION  12/2007   Complete heart block  . PACEMAKER LEAD REMOVAL Left 07/18/2015   Procedure: ATRIAL PACEMAKER LEAD EXTRACTION; ATRIAL PACEMAKER LEAD INSERTION; GENERATOR REPLACEMENT.;  Surgeon: [name], MD;  Location: [facility];  Service: Cardiovascular;  Laterality: Left;  [name] to back up  . PERIPHERAL VASCULAR CATHETERIZATION N/A 02/12/2015   Procedure: Venogram;  Surgeon: [name], MD;  Location: [place] CV LAB;  Service: Cardiovascular;  Laterality: N/A;  . TEE WITHOUT CARDIOVERSION N/A 07/18/2015   Procedure: TRANSESOPHAGEAL ECHOCARDIOGRAM (TEE);  Surgeon: [name], MD;  Location: [facility] OR;  Service: Cardiovascular;  Laterality: N/A;    There were no vitals filed for this visit.   Subjective Assessment - 07/09/20 0954    Subjective Pt thinks dysphagia is resolving. He does not have issue with solids. Feels like he has to be more careful about liqiuds - "It's sort of automatic - like walking up the stairs now, I have to be more careful."    Currently in Pain? No/denies  ADULT SLP TREATMENT - 07/10/20 0001      General Information   Behavior/Cognition Alert;Cooperative;Pleasant mood      Treatment Provided   Treatment provided Dysphagia;Cognitive-Linquistic      Dysphagia Treatment   Other treatment/comments see "s"      Cognitive-Linquistic Treatment   Treatment focused on Dysarthria    Skilled Treatment min A rarely with PHorTE exercises today - pt performs with YouTube PHorTE insturcutor. SLP had to modify pt's /a/ for more vocal pitch than singing pitch today. SLP noted pt has WNL voice more often than he  did at onset of ST. Pt agrees if voice is essentially the same after 3-4 more weeks referral to ENT might be appropriate.      Assessment / Recommendations / Plan   Plan Continue with current plan of care      Progression Toward Goals   Progression toward goals Progressing toward goals              SLP Short Term Goals - 07/10/20 0010      SLP SHORT TERM GOAL #1   Title pt will undergo objective swallow assessment    Status Deferred      SLP SHORT TERM GOAL #2   Title pt will demonstrate dysarthria HEP with rare min A over 3 sessions    Period --   or 9 sessions, for all STGs   Status Partially Met      SLP SHORT TERM GOAL #3   Title pt will demonstrate voice HEP with rare min A over 3 sessions    Baseline 06-25-20, 07-02-20, 07-09-20    Status Achieved      SLP SHORT TERM GOAL #4   Title pt will engage in 8 mintues conversation maintaining gentle voice in 3 sesions, to limit possibility of habitualizing muscle tension dysphonia    Baseline 07-02-20    Time 1    Period Weeks    Status On-going      SLP SHORT TERM GOAL #5   Title pt will demonstrate speech compensations in sentence responses 80% of the time, in 3 sesssions    Baseline 07-02-20    Time 1    Period Weeks    Status On-going            SLP Long Term Goals - 07/10/20 0011      SLP LONG TERM GOAL #1   Title pt will demonstrate voice HEP with modified indpendnece in 3 sessions    Time 5    Period Weeks   or 17 sessions, for all LTGs   Status On-going      SLP LONG TERM GOAL #2   Title pt will engage in 12 mintues conversation maintaining gentle voice 80% of the time, to limit possibility of habitualizing muscle tension dysphonia    Time 5    Period Weeks    Status On-going      SLP LONG TERM GOAL #3   Title pt will engage in 12 mintues mod complex conversation using speech strategies 80% of the time, in 3 sessions    Time 5    Period Weeks    Status On-going      SLP LONG TERM GOAL #4    Title pt will demonstrate improved score on Voice-related quality of life (QOL) measure (indicating better QOL) from therapy session #1    Time 5    Period Weeks    Status On-going  Plan - 07/10/20 0009    Clinical Impression Statement Jd "Marden Noble" presents today with mild flaccid dysarthria, and 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 Pt has been working with PHOrTE exercises at home. Skilled ST is necessary to improve pt's swallowing, speech, adn voice quality. If pt's voice does not show improvement in late Januray a referral to ENT may be necessary.    Speech Therapy Frequency 2x / week    Duration 8 weeks   17 visits   Treatment/Interventions Aspiration precaution training;Pharyngeal strengthening exercises;Diet toleration management by SLP;Trials of upgraded texture/liquids;Cueing hierarchy;SLP instruction and feedback;Functional tasks;Oral motor exercises;Compensatory strategies;Patient/family education;Internal/external aids;Other (comment)           Patient will benefit from skilled therapeutic intervention in order to improve the following deficits and impairments:   Voice hoarseness  Dysarthria and anarthria  Dysphagia, unspecified type    Problem List Patient Active Problem List   Diagnosis Date Noted  . Stroke ([place]) 05/16/2020  . Acute focal neurological deficit 05/15/2020  . BPH (benign prostatic hyperplasia) 02/02/2016  . Fractured atrial pacemaker lead wire 07/18/2015  . 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 Chest pain 08/02/2013  . SOB (shortness of breath) 08/02/2013  . PVC (premature ventricular contraction) 02/13/2013  . Chest pain on exertion 02/25/2012  . Pacemaker-dual-chamber Medtronic 04/29/2011  . Change in bowel habits 01/22/2011  . Weight loss 01/22/2011  . COUGH 09/01/2010  . Multiple sclerosis ([place])   . HOCM (hypertrophic obstructive cardiomyopathy) ([place])   . Right bundle branch  block   . Complete heart block ([place]) 12/05/2007    Adja Ruff ,MS, [place]  07/10/2020, 12:12 AM  [facility] [address] [place], [place], [address] Phone: [phone]   Fax:  [phone]   Name: SABATINO WILLIARD MRN: [id number] Date of Birth: [birth year]

## 2020-07-12 ENCOUNTER — Encounter: Payer: Self-pay | Admitting: Physical Therapy

## 2020-07-12 ENCOUNTER — Ambulatory Visit: Payer: BC Managed Care – PPO

## 2020-07-12 ENCOUNTER — Encounter: Payer: Self-pay | Admitting: Occupational Therapy

## 2020-07-12 ENCOUNTER — Ambulatory Visit: Payer: BC Managed Care – PPO | Admitting: Occupational Therapy

## 2020-07-12 ENCOUNTER — Ambulatory Visit: Payer: BC Managed Care – PPO | Admitting: Physical Therapy

## 2020-07-12 ENCOUNTER — Other Ambulatory Visit: Payer: Self-pay

## 2020-07-12 DIAGNOSIS — I69351 Hemiplegia and hemiparesis following cerebral infarction affecting right dominant side: Secondary | ICD-10-CM

## 2020-07-12 DIAGNOSIS — R278 Other lack of coordination: Secondary | ICD-10-CM

## 2020-07-12 DIAGNOSIS — R1310 Dysphagia, unspecified: Secondary | ICD-10-CM

## 2020-07-12 DIAGNOSIS — R2689 Other abnormalities of gait and mobility: Secondary | ICD-10-CM

## 2020-07-12 DIAGNOSIS — M6281 Muscle weakness (generalized): Secondary | ICD-10-CM

## 2020-07-12 DIAGNOSIS — R490 Dysphonia: Secondary | ICD-10-CM

## 2020-07-12 DIAGNOSIS — R41840 Attention and concentration deficit: Secondary | ICD-10-CM

## 2020-07-12 DIAGNOSIS — R471 Dysarthria and anarthria: Secondary | ICD-10-CM

## 2020-07-12 NOTE — Therapy (Signed)
[facility] [address] [place], [place], [address] Phone: [phone]   Fax:  [phone]  Physical Therapy Treatment/Re-Cert  Patient Details  Name: Rodney Adkins MRN: [id number] Date of Birth: [birth year] Referring Provider (PT): Otto Herb, MD   Encounter Date: 07/12/2020   PT End of Session - 07/12/20 1024    Visit Number 15    Number of Visits 27    Authorization Type BCBS PPO - FOTO patient    PT Start Time 0931    PT Stop Time 1017    PT Time Calculation (min) 46 min    Equipment Utilized During Treatment Gait belt    Activity Tolerance Patient tolerated treatment well    Behavior During Therapy WFL for tasks assessed/performed           Past Medical History:  Diagnosis Date  . Complete heart block [facility]) June 2009   a. Presented with hypotension/pulm edema 12/2007 when HOCM was also discovered. s/p dual chamber MDT pacemaker. Thallium stest 7/09 negative for infiltrative disease or ischemia.  Followed Dr [name]  Marland Kitchen Heart murmur   . HOCM (hypertrophic obstructive cardiomyopathy) ([place]) 2009   Discovered by ECHO when he developed CHB. Mgmt Dr Caryl Comes  and Dr [name] at [facility]. Has outflow obstruction on ECHO 2011  . Multiple sclerosis ([place]) 1998   Sxs started 1998 with L body numbness. MRI c/w MS. Started Avonex. Saw Dr [name] at [facility] and switched to Betaseron and was on for 5 yrs but developed depression and site rxns and D/C'd 2006. While on Betaseron, occassionally required solumedrol for flares.  . Pacemaker 2017  . Prostate enlargement   . Right bundle branch block   . SOB (shortness of breath)    a. PFTs 09/2010 essentially normal per pulm note (mild obst defect). b. 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  . Wenckebach    a. Decreased ex tolerance 2010/2012 - underwent stress echo to look @ effects of MV/LVOT - nothing noted; although pt had high rate Wenckebach behavior with associated ex  intol. Pacer reprogrammed with improvement in sx.    Past Surgical History:  Procedure Laterality Date  . LEFT HEART CATHETERIZATION WITH CORONARY ANGIOGRAM N/A 08/22/2013   Procedure: LEFT HEART CATHETERIZATION WITH CORONARY ANGIOGRAM;  Surgeon: [name], MD;  Location: [facility] CATH LAB;  Service: Cardiovascular;  Laterality: N/A;  . LEG TENDON SURGERY  2008   Os peroneus resection and peroneal tendon repair  . pace Agricultural engineer    . PACEMAKER INSERTION  12/2007   Complete heart block  . PACEMAKER LEAD REMOVAL Left 07/18/2015   Procedure: ATRIAL PACEMAKER LEAD EXTRACTION; ATRIAL PACEMAKER LEAD INSERTION; GENERATOR REPLACEMENT.;  Surgeon: [name], MD;  Location: [place];  Service: Cardiovascular;  Laterality: Left;  [name] to back up  . PERIPHERAL VASCULAR CATHETERIZATION N/A 02/12/2015   Procedure: Venogram;  Surgeon: [name], MD;  Location: [place] CV LAB;  Service: Cardiovascular;  Laterality: N/A;  . TEE WITHOUT CARDIOVERSION N/A 07/18/2015   Procedure: TRANSESOPHAGEAL ECHOCARDIOGRAM (TEE);  Surgeon: [name], MD;  Location: [facility] OR;  Service: Cardiovascular;  Laterality: N/A;    There were no vitals filed for this visit.   Subjective Assessment - 07/12/20 0934    Subjective No changes since he was last here.  Goal going forward is to walk without an AD. Still has to be very careful going down stairs.    Patient is accompained by: Family member  Pertinent History MS, CHB s/p PPM, HOCM    Limitations Walking;Writing;Standing;Lifting;House hold activities    Patient Stated Goals wants to be able to walk better.    Currently in Pain? No/denies              [facility] PT Assessment - 07/12/20 0954      Assessment   Medical Diagnosis Acute CVA    Referring Provider (PT) Otto Herb, MD    Onset Date/Surgical Date 05/14/20      Prior Function   Level of Independence Independent      Standardized Balance Assessment   Standardized Balance Assessment Dynamic Gait  Index;Timed Up and Go Test      Dynamic Gait Index   Level Surface Mild Impairment    Change in Gait Speed Mild Impairment    Gait with Horizontal Head Turns Mild Impairment    Gait with Vertical Head Turns Moderate Impairment    Gait and Pivot Turn Moderate Impairment    Step Over Obstacle Moderate Impairment    Step Around Obstacles Mild Impairment    Steps Mild Impairment    Total Score 13    DGI comment: 13/24      Timed Up and Go Test   Normal TUG (seconds) 14.19   with no AD                        [facility] Adult PT Treatment/Exercise - 07/12/20 0001      Ambulation/Gait   Ambulation/Gait Yes    Ambulation/Gait Assistance 5: Supervision;4: Min guard    Ambulation/Gait Assistance Details supervision outdoors walking over paved surfaces and grass with walking stick, indoors ambulating with no AD - min guard at times due to balance at times with pt with incr weight shift to L    Ambulation Distance (Feet) 400 Feet   outdoors, plus additional clinic distances with no AD   Assistive device Other (Comment);None   walking stick   Gait Pattern Step-through pattern;Decreased hip/knee flexion - right    Ambulation Surface Indoor;Level;Unlevel;Outdoor;Paved;Grass    Gait velocity 14.38 seconds with walking stick = .69 m/s    Stairs Yes    Stairs Assistance 5: Supervision    Stair Management Technique Alternating pattern;One rail Right;Forwards;Two rails    Number of Stairs 4    Height of Stairs 6    Curb 5: Supervision    Curb Details (indicate cue type and reason) with single walking pole, outdoors x2 reps             Access Code: [email or web address] URL: [email or web address] Date: 07/12/2020 Prepared by: Janann August  Began to review HEP (bolded below) and addition of standing knee flexion at counter - pt to use 2.5 lb ankle weights for standing marching and knee flexion when they come in the mail.   Exercises Side Stepping with Counter Support - 2 x  daily - 7 x weekly - 1 sets - 4 reps Mini Squat with Counter Support - 2 x daily - 5 x weekly - 2 sets - 10 reps Standing Marching - 2 x daily - 5 x weekly - 2 sets - 10 reps - alternating legs  Tandem Stance - 2 x daily - 5 x weekly - 1 sets - 3 reps - 20 sec hold Romberg Stance with Head Nods - 1 x daily - 5 x weekly - 2 sets - 10 reps Wide Stance with Eyes Closed on Foam Pad - 1 x daily -  5 x weekly - 3 sets - 30 hold Alternating tapping cup at counter - 1 x daily - 5 x weekly - 2 sets - 10 reps Standing Knee Flexion AROM with Chair Support - 2 x daily - 5 x weekly - 1 sets - 10 reps - with RLE       PT Education - 07/12/20 1024    Education Details progress towards goals/results of outcome measures, additions/revisions to HEP (use ankle weights for standing marching, R knee flexion), re-cert for an additional 2x week for 6 weeks    Person(s) Educated Patient    Methods Explanation;Demonstration    Comprehension Verbalized understanding;Returned demonstration            PT Short Term Goals - 06/19/20 0852      PT SHORT TERM GOAL #1   Title Pt will be independent with initial HEP.    Baseline Pt reports that he has been working on the exercises.    Time 4    Period Weeks    Status Achieved    Target Date 06/18/20      PT SHORT TERM GOAL #2   Title Pt will decrease 5xSTS to </= 15 seconds for decreased fall risk.    Baseline 22 s without UE support from standard arm chair 05/21/20. 06/19/20 15.79 sec from chair with hands and 17.37 sec without hands    Time 4    Period Weeks    Status Not Met    Target Date 06/18/20      PT SHORT TERM GOAL #3   Title Pt will increase gait speed to >/= 0.75 m/s for increased access to community and decreased fall risk.    Baseline 0.65 m/s with RW 05/21/20. 0.37ms with walker and 0.737m with cane    Time 4    Period Weeks    Status Achieved    Target Date 06/18/20      PT SHORT TERM GOAL #4   Title Pt will increase Berg score by 4  points in order to decrease fall risk and increase static and dynamic balance by a clincal detectable change (4 points = CDC).    Baseline 45/56 ind mod fall risk 05/21/20. 06/19/20 Berg=51/56    Time 4    Period Weeks    Status Achieved    Target Date 06/18/20          Revised STGs for recert:    PT Short Term Goals - 07/12/20 1045      PT SHORT TERM GOAL #1   Title Pt will improve DGI score to at least a 16/24 in order to demo decr fall risk. ALL STGS DUE 08/02/20    Baseline 13/24    Time 3    Period Weeks    Status New    Target Date 08/02/20      PT SHORT TERM GOAL #2   Title Pt will decrease 5xSTS to </= 15 seconds for decreased fall risk.    Baseline 07/09/20 16.62 sec    Time 3    Period Weeks    Status Revised      PT SHORT TERM GOAL #3   Title Pt will increase gait speed to >/= 0.8 m/s with single walking stick for increased access to community and decreased fall risk.    Baseline .69 m/s with walking stick    Time 3    Period Weeks    Status Revised      PT SHORT TERM  GOAL #4   Title Pt will decr TUG to 13 seconds or less with no AD in order to demo decr fall risk.    Baseline 14.19 seconds with no AD    Time 3    Period Weeks    Status New             PT Long Term Goals - 07/12/20 1027      PT LONG TERM GOAL #1   Title Pt will be independent with progressive HEP.    Baseline added exercises with ankle weights to HEP, pt will benefit from on-going additions    Time 8    Period Weeks    Status On-going      PT LONG TERM GOAL #2   Title Pt will decrease 5xSTS to </= 12 seconds to no longer be classified as fall risk and to be WNL for age and gender.    Baseline 22 s from standard arm chair without UE support. 06/19/20 17.37 sec, 07/09/20 16.62 sec    Time 8    Period Weeks    Status On-going      PT LONG TERM GOAL #3   Title Pt will reach >52/56 on Berg balance score for improved static and dynamic balance and decreased risk for falls.    Baseline  45/56 (mod fall risk) 05/21/20. 06/19/20 51/56. 53/56 on 07/09/20    Time 8    Period Weeks    Status Achieved      PT LONG TERM GOAL #4   Title Pt will reach 1.0 m/s gait speed for community ambulation and decreased fall risk.    Baseline 0.65 m/s. 06/19/20 0.12ms with walker and 0.762m with cane, .69 m/s with walking stick    Time 8    Period Weeks    Status Not Met      PT LONG TERM GOAL #5   Title Pt will ambulated 800+ feet on outdoor/unlevel surfaces with supervision and LRAD.    Baseline NT, 400' with supervision and walking stick (limited distance due to cold weather conditions)    Time 8    Period Weeks    Status Partially Met      PT LONG TERM GOAL #6   Title Pt will increase FOTO from 46 to >/= 70 for increased confidence in abilities.    Baseline 46 05/21/20    Time 8    Period Weeks    Status On-going           Revised/on-going LTGs for recert:      PT Long Term Goals - 07/12/20 1027      PT LONG TERM GOAL #1   Title Pt will be independent with progressive HEP. ALL LTGS DUE 08/23/20    Baseline added exercises with ankle weights to HEP, pt will benefit from on-going additions    Time 6    Period Weeks    Status On-going    Target Date 08/23/20      PT LONG TERM GOAL #2   Title Pt will decrease 5xSTS to </= 12 seconds to no longer be classified as fall risk and to be WNL for age and gender.    Baseline 22 s from standard arm chair without UE support. 06/19/20 17.37 sec, 07/09/20 16.62 sec    Time 6    Period Weeks    Status On-going      PT LONG TERM GOAL #3   Title Pt will improve score on DGI to  at least a 19/24 in order to demo decr fall risk.    Baseline 13/24    Time 6    Period Weeks    Status New      PT LONG TERM GOAL #4   Title Pt will reach .90 m/s gait speed for community ambulation and decreased fall risk with LRAD vs. no AD    Baseline 0.65 m/s. 06/19/20 0.5ms with walker and 0.717m with cane, .69 m/s with walking stick    Time 6     Period Weeks    Status Revised      PT LONG TERM GOAL #5   Title Pt will ambulated 800+ feet on outdoor/unlevel surfaces with supervision and LRAD vs. no AD.    Baseline NT, 400' with supervision and walking stick (limited distance due to cold weather conditions)    Time 6    Period Weeks    Status On-going      PT LONG TERM GOAL #6   Title Pt will increase FOTO from 46 to >/= 70 for increased confidence in abilities.    Baseline 46 05/21/20    Time 6    Period Weeks    Status On-going               Plan - 07/12/20 1035    Clinical Impression Statement Today's session focused on checking remainder of LTGs for re-cert. LTG #1 is on-going - began to review HEP today and update as appropriate. Pt did not meet LTG #4 - pt's gait speed of .69 m/s with walking stick indicates that pt is a limited community ambulator. Pt partially met LTG #5 - ambulate 400' outdoors with walking stick over unlevel surfaces with supervision (did not perform full distance due to cold weather). Performed the DGI with no AD with pt scoring a 13/24, indicating an incr risk for falls with pt with incr difficulty with stepping over obstacles, gait with head motions, and turns. Pt's score of 14.19 seconds on the TUG with no AD also puts pt at an incr risk for falls. Prior to CVA pt was not using any AD and was just using a walking stick for hiking. Will re-cert for an additional 2x week for 6 weeks to continue to work on balance, strengthening, gait training in order to decr pt's risk of falls and improve functional mobility.    Personal Factors and Comorbidities Comorbidity 3+;Age    Comorbidities MS, CHB s/p PPM, HOCM    Examination-Activity Limitations Squat;Locomotion Level;Transfers;Stairs;Stand    Examination-Participation Restrictions Yard Work;Occupation;Driving;Community Activity    Stability/Clinical Decision Making Evolving/Moderate complexity    Rehab Potential Good    PT Frequency 2x / week    PT  Duration 6 weeks    PT Treatment/Interventions Gait training;Stair training;Functional mobility training;Therapeutic activities;Therapeutic exercise;Balance training;Neuromuscular re-education;Patient/family education;Orthotic Fit/Training;DME Instruction;ADLs/Self Care Home Management;Energy conservation;Manual techniques;Vestibular    PT Next Visit Plan review remainder of HEP and update as appropriate. gait training with SPC with quad tip/walking stick (scanning, obstacles, over unlevel surfaces) and with no AD. floor <> stand transfers. corner balance exercises and on compliant surfaces, RLE strength    Recommended Other Services R foot up brace (pt has been given info on where to purchase)    Consulted and Agree with Plan of Care Patient;Family member/caregiver    Family Member Consulted Wife           Patient will benefit from skilled therapeutic intervention in order to improve the following deficits and impairments:  Abnormal gait,Decreased coordination,Difficulty walking,Decreased endurance,Decreased activity tolerance,Impaired perceived functional ability,Decreased balance,Decreased mobility,Decreased strength,Improper body mechanics  Visit Diagnosis: Muscle weakness (generalized)  Hemiplegia and hemiparesis following cerebral infarction affecting right dominant side ([place])  Other lack of coordination  Other abnormalities of gait and mobility     Problem List Patient Active Problem List   Diagnosis Date Noted  . Stroke ([place]) 05/16/2020  . Acute focal neurological deficit 05/15/2020  . BPH (benign prostatic hyperplasia) 02/02/2016  . Fractured atrial pacemaker lead wire 07/18/2015  . 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 Chest pain 08/02/2013  . SOB (shortness of breath) 08/02/2013  . PVC (premature ventricular contraction) 02/13/2013  . Chest pain on exertion 02/25/2012  . Pacemaker-dual-chamber Medtronic 04/29/2011  . Change in bowel habits 01/22/2011  . Weight loss  01/22/2011  . COUGH 09/01/2010  . Multiple sclerosis ([place])   . HOCM (hypertrophic obstructive cardiomyopathy) ([place])   . Right bundle branch block   . Complete heart block ([place]) 12/05/2007    Lillia Pauls, DPT  07/12/2020, 10:41 AM  [facility] [address] [place], [place], [address] Phone: [phone]   Fax:  [phone]  Name: DARRLY LOBERG MRN: [id number] Date of Birth: [birth year]

## 2020-07-12 NOTE — Patient Instructions (Signed)
Access Code: [email or web address] URL: [email or web address] Date: 07/12/2020 Prepared by: Janann August  Exercises Side Stepping with Counter Support - 2 x daily - 7 x weekly - 1 sets - 4 reps Mini Squat with Counter Support - 2 x daily - 5 x weekly - 2 sets - 10 reps Standing Marching - 2 x daily - 5 x weekly - 2 sets - 10 reps Tandem Stance - 2 x daily - 5 x weekly - 1 sets - 3 reps - 20 sec hold Romberg Stance with Head Nods - 1 x daily - 5 x weekly - 2 sets - 10 reps Wide Stance with Eyes Closed on Foam Pad - 1 x daily - 5 x weekly - 3 sets - 30 hold Alternating tapping cup at counter - 1 x daily - 5 x weekly - 2 sets - 10 reps Standing Knee Flexion AROM with Chair Support - 2 x daily - 5 x weekly - 1 sets - 10 reps

## 2020-07-12 NOTE — Therapy (Signed)
[facility] [address] [place], [place], [address] Phone: [phone]   Fax:  [phone]  Occupational Therapy Treatment  Patient Details  Name: Rodney Adkins MRN: [id number] Date of Birth: Jan 10, 1956 Referring Provider (OT): Darliss Cheney, MD   Encounter Date: 07/12/2020   OT End of Session - 07/12/20 1021    Visit Number 12    Number of Visits 17    Date for OT Re-Evaluation 07/19/20    Authorization Type State BCBS    Authorization Time Period VL:[place] Week 6 of 8 (1/4)    OT Start Time 1019    OT Stop Time 1100    OT Time Calculation (min) 41 min    Activity Tolerance Patient tolerated treatment well    Behavior During Therapy [facility] for tasks assessed/performed           Past Medical History:  Diagnosis Date  . Complete heart block [facility]) June 2009   a. Presented with hypotension/pulm edema 12/2007 when HOCM was also discovered. s/p dual chamber MDT pacemaker. Thallium stest 7/09 negative for infiltrative disease or ischemia.  Followed Dr [name]  Marland Kitchen Heart murmur   . HOCM (hypertrophic obstructive cardiomyopathy) ([place]) 2009   Discovered by ECHO when he developed CHB. Mgmt Dr Caryl Comes  and Dr [name] at [facility]. Has outflow obstruction on ECHO 2011  . Multiple sclerosis ([place]) 1998   Sxs started 1998 with L body numbness. MRI c/w MS. Started Avonex. Saw Dr [name] at [facility] and switched to Betaseron and was on for 5 yrs but developed depression and site rxns and D/C'd 2006. While on Betaseron, occassionally required solumedrol for flares.  . Pacemaker 2017  . Prostate enlargement   . Right bundle branch block   . SOB (shortness of breath)    a. PFTs 09/2010 essentially normal per pulm note (mild obst defect). b. 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  . Wenckebach    a. Decreased ex tolerance 2010/2012 - underwent stress echo to look @ effects of MV/LVOT - nothing noted; although pt had high rate Wenckebach behavior  with associated ex intol. Pacer reprogrammed with improvement in sx.    Past Surgical History:  Procedure Laterality Date  . LEFT HEART CATHETERIZATION WITH CORONARY ANGIOGRAM N/A 08/22/2013   Procedure: LEFT HEART CATHETERIZATION WITH CORONARY ANGIOGRAM;  Surgeon: [name], MD;  Location: [facility] CATH LAB;  Service: Cardiovascular;  Laterality: N/A;  . LEG TENDON SURGERY  2008   Os peroneus resection and peroneal tendon repair  . pace Agricultural engineer    . PACEMAKER INSERTION  12/2007   Complete heart block  . PACEMAKER LEAD REMOVAL Left 07/18/2015   Procedure: ATRIAL PACEMAKER LEAD EXTRACTION; ATRIAL PACEMAKER LEAD INSERTION; GENERATOR REPLACEMENT.;  Surgeon: [name], MD;  Location: [facility];  Service: Cardiovascular;  Laterality: Left;  [name] to back up  . PERIPHERAL VASCULAR CATHETERIZATION N/A 02/12/2015   Procedure: Venogram;  Surgeon: [name], MD;  Location: [place] [facility] CV LAB;  Service: Cardiovascular;  Laterality: N/A;  . TEE WITHOUT CARDIOVERSION N/A 07/18/2015   Procedure: TRANSESOPHAGEAL ECHOCARDIOGRAM (TEE);  Surgeon: [name], MD;  Location: [facility] OR;  Service: Cardiovascular;  Laterality: N/A;    There were no vitals filed for this visit.   Subjective Assessment - 07/12/20 1021    Subjective  Pt denies any changes or pain.    Patient is accompanied by: Family member   spouse   Pertinent History Fall Risk. Speech Deficits.  Limitations MS, CHB s/p PPM, HOCM    Patient Stated Goals right hand. 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 Return to driving recommendations.    Currently in Pain? No/denies                        OT Treatments/Exercises (OP) - 07/12/20 1024      Cognitive Exercises   Keyboarding word tris on typing master with animals Pt with good speed with hunt and peck method for typing and good precision. score of 2861 working on dual task of physical of typing and cognitive of processing what is  happening next and what to type      Hand Exercises   Other Hand Exercises Resistance Clothespins 1-8# with RUE with naming an object that is the same color of the clothespins. Pt required moderate verbal cues for attending to the color instead the letter for word finding. (i.e. wanted to say "rat" instead of an object that was red). Min difficulty with coordination of clothespins    Other Hand Exercises DigiFlex x 10 reps of whole hand for grip strengthening      Visual/Perceptual Exercises   Copy this Image Pegboard    Pegboard followed pattern with no difficulty      Fine Motor Coordination (Hand/Wrist)   Fine Motor Coordination Small Pegboard    Small Pegboard in hand manipulation for placing small pegs into pegboard - 4 in palm and translating one at a time to fingertips to place in pegboard. Pt with mod difficulty and dropping of pegs for translation to fingertips.                    OT Short Term Goals - 07/09/20 1108      OT SHORT TERM GOAL #1   Title Pt will be independent with HEP 06/21/20    Time 4    Period Weeks    Status Achieved    Target Date 06/21/20      OT SHORT TERM GOAL #2   Title Pt will demonstrate improved fine motor coordination by demonstrating decrease in 9 hole peg test score by 6 seconds with RUE.    Baseline RUE 71.06    Time 4    Period Weeks    Status Achieved      OT SHORT TERM GOAL #3   Title Pt will perform simple warm meal prep or home management task with supervision and good safety awareness.    Time 4    Period Weeks    Status Achieved      OT SHORT TERM GOAL #4   Title Pt will write 2-3 sentences with 75% legibility and increased size of letters (mild micrographia).    Baseline severe micrographia    Time 4    Period Weeks    Status Achieved   3 sentences with good letter size and legibility     OT SHORT TERM GOAL #5   Title Pt will demonstrate increase in grip strength by 5 lbs in RUE for increased ease with ADLs and  clothing management, etc.    Baseline RUE 45.4 (LUE 65.6)    Time 4    Period Weeks    Status Achieved   RUE 55.7 lbs     OT SHORT TERM GOAL #6   Title Pt will report using RUE (dominant hand) for 25% or more of functional tasks.    Time 4  Period Weeks    Status Achieved             OT Long Term Goals - 07/09/20 1108      OT LONG TERM GOAL #1   Title Pt will be independent with updated HEP 07/19/2020    Time 8    Period Weeks    Status On-going      OT LONG TERM GOAL #2   Title Pt will demonstrate improved fine motor coordination by demonstrating decrease in 9 hole peg test score by 15 seconds with RUE.    Time 8    Period Weeks    Status Achieved      OT LONG TERM GOAL #3   Title Pt will perform work simulated tasks (typing, using mouse, etc) with RUE as dominant hand with 90% accuracy.    Time 8    Period Weeks    Status On-going      OT LONG TERM GOAL #4   Title Pt will report completing at least 1 home management task per day with mod I in order to decrease caregiver burden.    Time 8    Period Weeks    Status Achieved      OT LONG TERM GOAL #5   Title Pt will improve grip strength by at least 10 lbs in RUE for strengthening dominant hand for functional use and tasks.    Baseline RUE 45.4 (LUE 65.6)    Time 8    Period Weeks    Status On-going   52.9 RUE     OT LONG TERM GOAL #6   Title Pt will report using RUE (dominant hand) for at least 75% of functional tasks per day.    Time 8    Period Weeks    Status On-going      OT LONG TERM GOAL #7   Title Pt will perform physical and cognitive tasks simultaneously with 95% accuracy for returning to prior tasks.    Time 8    Period Weeks    Status On-going      OT LONG TERM GOAL #8   Title Pt will write 3-5 sentences with 90% legibility and appropriate sizing.    Time 8    Period Weeks    Status On-going                 Plan - 07/12/20 1035    Clinical Impression Statement Pt is making  progress towards goals.    OT Occupational Profile and History Detailed Assessment- Review of Records and additional review of physical, cognitive, psychosocial history related to current functional performance    Occupational performance deficits (Please refer to evaluation for details): IADL's;ADL's;Social Participation;Leisure;Work;Rest and Sleep    Body Structure / Function / Physical Skills ADL;Dexterity;Decreased knowledge of use of DME;Strength;UE functional use;Endurance;IADL;Coordination;FMC    Rehab Potential Good    Clinical Decision Making Limited treatment options, no task modification necessary    Comorbidities Affecting Occupational Performance: None    Modification or Assistance to Complete Evaluation  No modification of tasks or assist necessary to complete eval    OT Frequency 2x / week    OT Duration 8 weeks   or 16 visits (+ eval) over extended time   OT Treatment/Interventions Self-care/ADL training;DME and/or AE instruction;Gait Training;Therapeutic activities;Balance training;Cognitive remediation/compensation;Therapeutic exercise;Neuromuscular education;Passive range of motion;Visual/perceptual remediation/compensation;Patient/family education;Energy conservation    Plan dual task (phys/cog), RUE coordination and grip strength, add visits at renewal    OT Home  Exercise Plan coordination HEP    Consulted and Agree with Plan of Care Patient;Family member/caregiver   spouse   Family Member Consulted spouse           Patient will benefit from skilled therapeutic intervention in order to improve the following deficits and impairments:   Body Structure / Function / Physical Skills: ADL,Dexterity,Decreased knowledge of use of DME,Strength,UE functional use,Endurance,IADL,Coordination,FMC       Visit Diagnosis: Muscle weakness (generalized)  Hemiplegia and hemiparesis following cerebral infarction affecting right dominant side (HCC)  Other lack of  coordination  Attention and concentration deficit  Other abnormalities of gait and mobility    Problem List Patient Active Problem List   Diagnosis Date Noted  . Stroke ([place]) 05/16/2020  . Acute focal neurological deficit 05/15/2020  . BPH (benign prostatic hyperplasia) 02/02/2016  . Fractured atrial pacemaker lead wire 07/18/2015  . 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 Chest pain 08/02/2013  . SOB (shortness of breath) 08/02/2013  . PVC (premature ventricular contraction) 02/13/2013  . Chest pain on exertion 02/25/2012  . Pacemaker-dual-chamber Medtronic 04/29/2011  . Change in bowel habits 01/22/2011  . Weight loss 01/22/2011  . COUGH 09/01/2010  . Multiple sclerosis ([place])   . HOCM (hypertrophic obstructive cardiomyopathy) ([place])   . Right bundle branch block   . Complete heart block ([place]) 12/05/2007    Zachery Conch MOT, OTR/L  07/12/2020, 12:09 PM  [facility] [address] [place], [place], [address] Phone: [phone]   Fax:  [phone]  Name: JESSELEE POTH MRN: [id number] Date of Birth: [birth year]

## 2020-07-14 NOTE — Therapy (Addendum)
[facility] [address] [place], [place], [address] Phone: [phone]   Fax:  [phone]  Speech Language Pathology Treatment  Patient Details  Name: Rodney Adkins MRN: [id number] Date of Birth: 16-May-1956 Referring Provider (SLP): Darliss Cheney,  MD   Encounter Date: 07/12/2020   End of Session - 07/14/20 1606    Visit Number 9    Number of Visits 17    Date for SLP Re-Evaluation 09/10/20    SLP Start Time 65    SLP Stop Time  1145    SLP Time Calculation (min) 40 min    Activity Tolerance Patient tolerated treatment well           Past Medical History:  Diagnosis Date  . Complete heart block [facility]) June 2009   a. Presented with hypotension/pulm edema 12/2007 when HOCM was also discovered. s/p dual chamber MDT pacemaker. Thallium stest 7/09 negative for infiltrative disease or ischemia.  Followed Dr [name]  Marland Kitchen Heart murmur   . HOCM (hypertrophic obstructive cardiomyopathy) ([place]) 2009   Discovered by ECHO when he developed CHB. Mgmt Dr Caryl Comes  and Dr [name] at [facility]. Has outflow obstruction on ECHO 2011  . Multiple sclerosis ([place]) 1998   Sxs started 1998 with L body numbness. MRI c/w MS. Started Avonex. Saw Dr [name] at [facility] and switched to Betaseron and was on for 5 yrs but developed depression and site rxns and D/C'd 2006. While on Betaseron, occassionally required solumedrol for flares.  . Pacemaker 2017  . Prostate enlargement   . Right bundle branch block   . SOB (shortness of breath)    a. PFTs 09/2010 essentially normal per pulm note (mild obst defect). b. 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  . 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 Pacer reprogrammed with improvement in sx.    Past Surgical History:  Procedure Laterality Date  . LEFT  HEART CATHETERIZATION WITH CORONARY ANGIOGRAM N/A 08/22/2013   Procedure: LEFT HEART CATHETERIZATION WITH CORONARY ANGIOGRAM;  Surgeon: [name], MD;  Location: [facility] CATH LAB;  Service: Cardiovascular;  Laterality: N/A;  . LEG TENDON SURGERY  2008   Os peroneus resection and peroneal tendon repair  . pace Agricultural engineer    . PACEMAKER INSERTION  12/2007   Complete heart block  . PACEMAKER LEAD REMOVAL Left 07/18/2015   Procedure: ATRIAL PACEMAKER LEAD EXTRACTION; ATRIAL PACEMAKER LEAD INSERTION; GENERATOR REPLACEMENT.;  Surgeon: [name], MD;  Location: [facility];  Service: Cardiovascular;  Laterality: Left;  [name] to back up  . PERIPHERAL VASCULAR CATHETERIZATION N/A 02/12/2015   Procedure: Venogram;  Surgeon: [name], MD;  Location: [facility] CV LAB;  Service: Cardiovascular;  Laterality: N/A;  . TEE WITHOUT CARDIOVERSION N/A 07/18/2015   Procedure: TRANSESOPHAGEAL ECHOCARDIOGRAM (TEE);  Surgeon: [name], MD;  Location: [facility] OR;  Service: Cardiovascular;  Laterality: N/A;    There were no vitals filed for this visit.   Subjective Assessment - 07/14/20 1558    Subjective Voice exercises cont to be completed at home.    Currently in Pain? No/denies                 ADULT SLP TREATMENT - 07/14/20 0001      General Information   Behavior/Cognition Alert;Cooperative;Pleasant mood      Cognitive-Linquistic  Treatment   Treatment focused on Dysarthria    Skilled Treatment SLP targeted overarticulation in sentence responses - pt req'd mod A occasionally faded to min-mod A rarely. Pt with good success with ability to ID less intelligible utterances as he repeated utterances which he thought were unintelligible. Pt had some carryover to conversation but inconsistent.      Assessment / Recommendations / Plan   Plan Continue with current plan of care      Progression Toward Goals   Progression toward goals Progressing toward goals              SLP Short Term Goals -  07/10/20 0010      SLP SHORT TERM GOAL #1   Title pt will undergo objective swallow assessment    Status Deferred      SLP SHORT TERM GOAL #2   Title pt will demonstrate dysarthria HEP with rare min A over 3 sessions    Period --   or 9 sessions, for all STGs   Status Partially Met      SLP SHORT TERM GOAL #3   Title pt will demonstrate voice HEP with rare min A over 3 sessions    Baseline 06-25-20, 07-02-20, 07-09-20    Status Achieved      SLP SHORT TERM GOAL #4   Title pt will engage in 8 mintues conversation maintaining gentle voice in 3 sesions, to limit possibility of habitualizing muscle tension dysphonia    Baseline 07-02-20    Time 1    Period Weeks    Status On-going      SLP SHORT TERM GOAL #5   Title pt will demonstrate speech compensations in sentence responses 80% of the time, in 3 sesssions    Baseline 07-02-20    Time 1    Period Weeks    Status On-going            SLP Long Term Goals - 07/14/20 1608      SLP LONG TERM GOAL #1   Title pt will demonstrate voice HEP with modified indpendnece in 3 sessions    Time 5    Period Weeks   or 17 sessions, for all LTGs   Status On-going      SLP LONG TERM GOAL #2   Title pt will engage in 12 mintues conversation maintaining gentle voice 80% of the time, to limit possibility of habitualizing muscle tension dysphonia    Time 5    Period Weeks    Status On-going      SLP LONG TERM GOAL #3   Title pt will engage in 12 mintues mod complex conversation using speech strategies 80% of the time, in 3 sessions    Time 5    Period Weeks    Status On-going      SLP LONG TERM GOAL #4   Title pt will demonstrate improved score on Voice-related quality of life (QOL) measure (indicating better QOL) from therapy session #1    Time 5    Period Weeks    Status On-going            Plan - 07/14/20 1607    Clinical Impression Statement Rodney "Marden Adkins" presents today with mild flaccid dysarthria, and suspected rt vocal  fold involvement causing mod hoarseness following a CVA on 05-15-20. Pt has been working with PHOrTE exercises at home. Skilled ST is necessary to improve pt's swallowing, speech, adn voice quality. If pt's voice does not show improvement in  late Januray a referral to ENT may be necessary.    Speech Therapy Frequency 2x / week    Duration 8 weeks   17 visits   Treatment/Interventions Aspiration precaution training;Pharyngeal strengthening exercises;Diet toleration management by SLP;Trials of upgraded texture/liquids;Cueing hierarchy;SLP instruction and feedback;Functional tasks;Oral motor exercises;Compensatory strategies;Patient/family education;Internal/external aids;Other (comment)           Patient will benefit from skilled therapeutic intervention in order to improve the following deficits and impairments:   Dysarthria and anarthria  Voice hoarseness  Dysphagia, unspecified type    Problem List Patient Active Problem List   Diagnosis Date Noted  . Stroke ([place]) 05/16/2020  . Acute focal neurological deficit 05/15/2020  . BPH (benign prostatic hyperplasia) 02/02/2016  . Fractured atrial pacemaker lead wire 07/18/2015  . 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 Chest pain 08/02/2013  . SOB (shortness of breath) 08/02/2013  . PVC (premature ventricular contraction) 02/13/2013  . Chest pain on exertion 02/25/2012  . Pacemaker-dual-chamber Medtronic 04/29/2011  . Change in bowel habits 01/22/2011  . Weight loss 01/22/2011  . COUGH 09/01/2010  . Multiple sclerosis ([place])   . HOCM (hypertrophic obstructive cardiomyopathy) ([place])   . Right bundle branch block   . Complete heart block ([place]) 12/05/2007    Jubal Rademaker ,MS, CCC-SLP  07/14/2020, 4:11 PM  [facility] [address] [place], [place], [address] Phone: [phone]   Fax:  [phone]   Name: NEFI MUSICH MRN: [id number] Date of Birth: 10/09/1955

## 2020-07-16 ENCOUNTER — Encounter: Payer: Self-pay | Admitting: Occupational Therapy

## 2020-07-16 ENCOUNTER — Ambulatory Visit: Payer: BC Managed Care – PPO | Admitting: Occupational Therapy

## 2020-07-16 ENCOUNTER — Ambulatory Visit: Payer: BC Managed Care – PPO

## 2020-07-16 ENCOUNTER — Other Ambulatory Visit: Payer: Self-pay

## 2020-07-16 DIAGNOSIS — I69351 Hemiplegia and hemiparesis following cerebral infarction affecting right dominant side: Secondary | ICD-10-CM

## 2020-07-16 DIAGNOSIS — R278 Other lack of coordination: Secondary | ICD-10-CM

## 2020-07-16 DIAGNOSIS — R2689 Other abnormalities of gait and mobility: Secondary | ICD-10-CM | POA: Diagnosis not present

## 2020-07-16 DIAGNOSIS — M6281 Muscle weakness (generalized): Secondary | ICD-10-CM

## 2020-07-16 DIAGNOSIS — R41840 Attention and concentration deficit: Secondary | ICD-10-CM

## 2020-07-16 NOTE — Therapy (Signed)
[facility] [address] [place], [place], [address] Phone: [phone]   Fax:  [phone]  Physical Therapy Treatment  Patient Details  Name: Rodney Adkins MRN: [id number] Date of Birth: Jul 17, 1955 Referring Provider (PT): Otto Herb, MD   Encounter Date: 07/16/2020   PT End of Session - 07/16/20 1019    Visit Number 16    Number of Visits 27    Authorization Type BCBS PPO - FOTO patient    PT Start Time 1017    PT Stop Time 1103    PT Time Calculation (min) 46 min    Equipment Utilized During Treatment Gait belt    Activity Tolerance Patient tolerated treatment well    Behavior During Therapy WFL for tasks assessed/performed           Past Medical History:  Diagnosis Date  . Complete heart block [facility]) June 2009   a. Presented with hypotension/pulm edema 12/2007 when HOCM was also discovered. s/p dual chamber MDT pacemaker. Thallium stest 7/09 negative for infiltrative disease or ischemia.  Followed Dr [name]  Marland Kitchen Heart murmur   . HOCM (hypertrophic obstructive cardiomyopathy) ([place]) 2009   Discovered by ECHO when he developed CHB. Mgmt Dr Caryl Comes  and Dr [name] at [facility]. Has outflow obstruction on ECHO 2011  . Multiple sclerosis ([place]) 1998   Sxs started 1998 with L body numbness. MRI c/w MS. Started Avonex. Saw Dr [name] at [facility] and switched to Betaseron and was on for 5 yrs but developed depression and site rxns and D/C'd 2006. While on Betaseron, occassionally required solumedrol for flares.  . Pacemaker 2017  . Prostate enlargement   . Right bundle branch block   . SOB (shortness of breath)    a. PFTs 09/2010 essentially normal per pulm note (mild obst defect). b. 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  . 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  Pacer reprogrammed with improvement in sx.    Past Surgical History:  Procedure Laterality Date  . LEFT HEART CATHETERIZATION WITH CORONARY ANGIOGRAM N/A 08/22/2013   Procedure: LEFT HEART CATHETERIZATION WITH CORONARY ANGIOGRAM;  Surgeon: [name], MD;  Location: [facility] CATH LAB;  Service: Cardiovascular;  Laterality: N/A;  . LEG TENDON SURGERY  2008   Os peroneus resection and peroneal tendon repair  . pace Agricultural engineer    . PACEMAKER INSERTION  12/2007   Complete heart block  . PACEMAKER LEAD REMOVAL Left 07/18/2015   Procedure: ATRIAL PACEMAKER LEAD EXTRACTION; ATRIAL PACEMAKER LEAD INSERTION; GENERATOR REPLACEMENT.;  Surgeon: [name], MD;  Location: [facility];  Service: Cardiovascular;  Laterality: Left;  [name] to back up  . PERIPHERAL VASCULAR CATHETERIZATION N/A 02/12/2015   Procedure: Venogram;  Surgeon: [name], MD;  Location: [place] [facility] CV LAB;  Service: Cardiovascular;  Laterality: N/A;  . TEE WITHOUT CARDIOVERSION N/A 07/18/2015   Procedure: TRANSESOPHAGEAL ECHOCARDIOGRAM (TEE);  Surgeon: [name], MD;  Location: [facility] OR;  Service: Cardiovascular;  Laterality: N/A;    There were no vitals filed for this visit.   Subjective Assessment - 07/16/20 1427    Subjective Pt reports he is doing well.    Patient is accompained by: Family member    Pertinent History MS, CHB s/p PPM, HOCM    Limitations Walking;Writing;Standing;Lifting;House hold activities    Patient  Stated Goals wants to be able to walk better.    Currently in Pain? No/denies                             [facility] Adult PT Treatment/Exercise - 07/16/20 1037      Ambulation/Gait   Ambulation/Gait Yes    Ambulation/Gait Assistance 5: Supervision    Ambulation/Gait Assistance Details Pt ambulated first 3rd with walking stick then switched to no AD. Pt was given verbal cues to increase arm swing and focus in good heel strike on right.    Ambulation Distance (Feet) 590 Feet    Assistive  device None   walking stick   Gait Pattern Step-through pattern;Decreased hip/knee flexion - right;Decreased stance time - right;Decreased arm swing - right    Ambulation Surface Level;Indoor      Neuro Re-ed    Neuro Re-ed Details  Dynamic gait activities over obstacle course: reciprocal stepping over 2 bolsters and 3 yard sticks and weaving in and out of 5 cones x 6 bouts total, then switched to the reciprocal steps still then tapping cone and performing reciprocal step x 4 bouts CGA. Increased difficulty with right SLS. Tandem stance x 30 sec each position, Standing on airex with feet apart eyes closed 30 sec x 2 then feet together eyes open x 30 sec then head turns left/right x 10. Increased sway with eyes closed and with head turns. CGA for safety.      Exercises   Exercises Other Exercises    Other Exercises  Long sit hamstring stretch x 30 sec. Pt has about 75 degrees of hamstring length. Prone hamstring curl right x 10 with verbal cues to control lowering as well with muscle tapping at times to facilitate contraction. Bridges x 10 then bridge with just RLE x 10 with verbal cues for form, bridging over green physioball with added hamstring curl x 10.                  PT Education - 07/16/20 1433    Education Details Added prone hamstring curls to HEP    Person(s) Educated Patient    Methods Explanation;Demonstration    Comprehension Verbalized understanding            PT Short Term Goals - 07/12/20 1045      PT SHORT TERM GOAL #1   Title Pt will improve DGI score to at least a 16/24 in order to demo decr fall risk. ALL STGS DUE 08/02/20    Baseline 13/24    Time 3    Period Weeks    Status New    Target Date 08/02/20      PT SHORT TERM GOAL #2   Title Pt will decrease 5xSTS to </= 15 seconds for decreased fall risk.    Baseline 07/09/20 16.62 sec    Time 3    Period Weeks    Status Revised      PT SHORT TERM GOAL #3   Title Pt will increase gait speed to >/= 0.8  m/s with single walking stick for increased access to community and decreased fall risk.    Baseline .69 m/s with walking stick    Time 3    Period Weeks    Status Revised      PT SHORT TERM GOAL #4   Title Pt will decr TUG to 13 seconds or less with no AD in order to demo decr fall  risk.    Baseline 14.19 seconds with no AD    Time 3    Period Weeks    Status New             PT Long Term Goals - 07/12/20 1027      PT LONG TERM GOAL #1   Title Pt will be independent with progressive HEP. ALL LTGS DUE 08/23/20    Baseline added exercises with ankle weights to HEP, pt will benefit from on-going additions    Time 6    Period Weeks    Status On-going    Target Date 08/23/20      PT LONG TERM GOAL #2   Title Pt will decrease 5xSTS to </= 12 seconds to no longer be classified as fall risk and to be WNL for age and gender.    Baseline 22 s from standard arm chair without UE support. 06/19/20 17.37 sec, 07/09/20 16.62 sec    Time 6    Period Weeks    Status On-going      PT LONG TERM GOAL #3   Title Pt will improve score on DGI to at least a 19/24 in order to demo decr fall risk.    Baseline 13/24    Time 6    Period Weeks    Status New      PT LONG TERM GOAL #4   Title Pt will reach .90 m/s gait speed for community ambulation and decreased fall risk with LRAD vs. no AD    Baseline 0.65 m/s. 06/19/20 0.16m/s with walker and 0.58m/s with cane, .69 m/s with walking stick    Time 6    Period Weeks    Status Revised      PT LONG TERM GOAL #5   Title Pt will ambulated 800+ feet on outdoor/unlevel surfaces with supervision and LRAD vs. no AD.    Baseline NT, 400' with supervision and walking stick (limited distance due to cold weather conditions)    Time 6    Period Weeks    Status On-going      PT LONG TERM GOAL #6   Title Pt will increase FOTO from 46 to >/= 70 for increased confidence in abilities.    Baseline 46 05/21/20    Time 6    Period Weeks    Status On-going                  Plan - 07/16/20 1434    Clinical Impression Statement PT focused more on right hamstring strengthening during session today. Pt able to demonstrate smoother reciprocal pattern after hamstring focus. Also worked on reciprocal stepping which pt improved on as went on over obstacles.    Personal Factors and Comorbidities Comorbidity 3+;Age    Comorbidities MS, CHB s/p PPM, HOCM    Examination-Activity Limitations Squat;Locomotion Level;Transfers;Stairs;Stand    Examination-Participation Restrictions Yard Work;Occupation;Driving;Community Activity    Stability/Clinical Decision Making Evolving/Moderate complexity    Rehab Potential Good    PT Frequency 2x / week    PT Duration 6 weeks    PT Treatment/Interventions Gait training;Stair training;Functional mobility training;Therapeutic activities;Therapeutic exercise;Balance training;Neuromuscular re-education;Patient/family education;Orthotic Fit/Training;DME Instruction;ADLs/Self Care Home Management;Energy conservation;Manual techniques;Vestibular    PT Next Visit Plan gait training with SPC with quad tip/walking stick (scanning, obstacles, over unlevel surfaces) and with no AD. floor <> stand transfers. corner balance exercises and on compliant surfaces, RLE strength    Consulted and Agree with Plan of Care Patient;Family member/caregiver  Family Member Consulted Wife           Patient will benefit from skilled therapeutic intervention in order to improve the following deficits and impairments:  Abnormal gait,Decreased coordination,Difficulty walking,Decreased endurance,Decreased activity tolerance,Impaired perceived functional ability,Decreased balance,Decreased mobility,Decreased strength,Improper body mechanics  Visit Diagnosis: Other abnormalities of gait and mobility  Muscle weakness (generalized)     Problem List Patient Active Problem List   Diagnosis Date Noted  . Stroke ([place]) 05/16/2020  . Acute focal  neurological deficit 05/15/2020  . BPH (benign prostatic hyperplasia) 02/02/2016  . Fractured atrial pacemaker lead wire 07/18/2015  . 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 Chest pain 08/02/2013  . SOB (shortness of breath) 08/02/2013  . PVC (premature ventricular contraction) 02/13/2013  . Chest pain on exertion 02/25/2012  . Pacemaker-dual-chamber Medtronic 04/29/2011  . Change in bowel habits 01/22/2011  . Weight loss 01/22/2011  . COUGH 09/01/2010  . Multiple sclerosis ([place])   . HOCM (hypertrophic obstructive cardiomyopathy) ([place])   . Right bundle branch block   . Complete heart block ([place]) 12/05/2007    Electa Sniff, PT, DPT, NCS 07/16/2020, 2:36 PM  [facility] [address] [place], [place], [address] Phone: [phone]   Fax:  [phone]  Name: Rodney Adkins MRN: [id number] Date of Birth: 02/06/1956

## 2020-07-16 NOTE — Therapy (Signed)
[facility] [address] [place], [place], [address] Phone: [phone]   Fax:  [phone]  Occupational Therapy Treatment  Patient Details  Name: Rodney Adkins MRN: [id number] Date of Birth: [birth year] Referring Provider (OT): Darliss Cheney, MD   Encounter Date: 07/16/2020   OT End of Session - 07/16/20 1106    Visit Number 13    Number of Visits 17    Date for OT Re-Evaluation 07/26/20   changed to 07/26/20 d/t scheduling.   Authorization Type State BCBS    Authorization Time Period VL:[place] Week 7 of 8 (1/11)    OT Start Time 1106    OT Stop Time 1145    OT Time Calculation (min) 39 min    Activity Tolerance Patient tolerated treatment well    Behavior During Therapy WFL for tasks assessed/performed           Past Medical History:  Diagnosis Date  . Complete heart block [facility]) June 2009   a. Presented with hypotension/pulm edema 12/2007 when HOCM was also discovered. s/p dual chamber MDT pacemaker. Thallium stest 7/09 negative for infiltrative disease or ischemia.  Followed Dr [name]  Marland Kitchen Heart murmur   . HOCM (hypertrophic obstructive cardiomyopathy) ([place]) 2009   Discovered by ECHO when he developed CHB. Mgmt Dr Caryl Comes  and Dr [name] at [facility]. Has outflow obstruction on ECHO 2011  . Multiple sclerosis ([place]) 1998   Sxs started 1998 with L body numbness. MRI c/w MS. Started Avonex. Saw Dr [name] at [facility] and switched to Betaseron and was on for 5 yrs but developed depression and site rxns and D/C'd 2006. While on Betaseron, occassionally required solumedrol for flares.  . Pacemaker 2017  . Prostate enlargement   . Right bundle branch block   . SOB (shortness of breath)    a. PFTs 09/2010 essentially normal per pulm note (mild obst defect). b. 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  . Wenckebach    a. Decreased ex tolerance 2010/2012 - underwent stress echo to look @ effects of MV/LVOT - nothing noted;  although pt had high rate Wenckebach behavior with associated ex intol. Pacer reprogrammed with improvement in sx.    Past Surgical History:  Procedure Laterality Date  . LEFT HEART CATHETERIZATION WITH CORONARY ANGIOGRAM N/A 08/22/2013   Procedure: LEFT HEART CATHETERIZATION WITH CORONARY ANGIOGRAM;  Surgeon: [name], MD;  Location: [facility] CATH LAB;  Service: Cardiovascular;  Laterality: N/A;  . LEG TENDON SURGERY  2008   Os peroneus resection and peroneal tendon repair  . pace Agricultural engineer    . PACEMAKER INSERTION  12/2007   Complete heart block  . PACEMAKER LEAD REMOVAL Left 07/18/2015   Procedure: ATRIAL PACEMAKER LEAD EXTRACTION; ATRIAL PACEMAKER LEAD INSERTION; GENERATOR REPLACEMENT.;  Surgeon: [name], MD;  Location: [facility];  Service: Cardiovascular;  Laterality: Left;  [name] to back up  . PERIPHERAL VASCULAR CATHETERIZATION N/A 02/12/2015   Procedure: Venogram;  Surgeon: [name], MD;  Location: [facility] CV LAB;  Service: Cardiovascular;  Laterality: N/A;  . TEE WITHOUT CARDIOVERSION N/A 07/18/2015   Procedure: TRANSESOPHAGEAL ECHOCARDIOGRAM (TEE);  Surgeon: [name], MD;  Location: [facility] OR;  Service: Cardiovascular;  Laterality: N/A;    There were no vitals filed for this visit.   Subjective Assessment - 07/16/20 1124    Subjective  Pt denies any changes or pain.    Patient is accompanied by: Family member   spouse   Pertinent  History Fall Risk. Speech Deficits.    Limitations MS, CHB s/p PPM, HOCM    Patient Stated Goals right hand. Getting hand working better with typing, working mouse on Animator, Chiropractor. Return to driving recommendations.    Currently in Pain? No/denies                        OT Treatments/Exercises (OP) - 07/16/20 1111      Fine Motor Coordination (Hand/Wrist)   Fine Motor Coordination O'Connor pegs    Handwriting pencil control with tracing lines and path tracing for increase in control and dynamic movement with  lifting pen < 2 times total. Pt with increased control.    O'Connor pegs placing pegs in holes with RUE. minimal difficulty and min drops. pt removed with use of tweezers in RUE. activity with increased time          Wall push ups x 10  Theraband (yellow) exercises - shoulder flexion, tricep extension, horizontal abduction and scapular retraction x 10        OT Education - 07/16/20 1146    Education Details issued yellow theraband exercises    Person(s) Educated Patient    Methods Explanation;Demonstration;Handout    Comprehension Verbalized understanding;Returned demonstration            OT Short Term Goals - 07/09/20 1108      OT SHORT TERM GOAL #1   Title Pt will be independent with HEP 06/21/20    Time 4    Period Weeks    Status Achieved    Target Date 06/21/20      OT SHORT TERM GOAL #2   Title Pt will demonstrate improved fine motor coordination by demonstrating decrease in 9 hole peg test score by 6 seconds with RUE.    Baseline RUE 71.06    Time 4    Period Weeks    Status Achieved      OT SHORT TERM GOAL #3   Title Pt will perform simple warm meal prep or home management task with supervision and good safety awareness.    Time 4    Period Weeks    Status Achieved      OT SHORT TERM GOAL #4   Title Pt will write 2-3 sentences with 75% legibility and increased size of letters (mild micrographia).    Baseline severe micrographia    Time 4    Period Weeks    Status Achieved   3 sentences with good letter size and legibility     OT SHORT TERM GOAL #5   Title Pt will demonstrate increase in grip strength by 5 lbs in RUE for increased ease with ADLs and clothing management, etc.    Baseline RUE 45.4 (LUE 65.6)    Time 4    Period Weeks    Status Achieved   RUE 55.7 lbs     OT SHORT TERM GOAL #6   Title Pt will report using RUE (dominant hand) for 25% or more of functional tasks.    Time 4    Period Weeks    Status Achieved             OT Long  Term Goals - 07/09/20 1108      OT LONG TERM GOAL #1   Title Pt will be independent with updated HEP 07/19/2020    Time 8    Period Weeks    Status On-going      OT LONG  TERM GOAL #2   Title Pt will demonstrate improved fine motor coordination by demonstrating decrease in 9 hole peg test score by 15 seconds with RUE.    Time 8    Period Weeks    Status Achieved      OT LONG TERM GOAL #3   Title Pt will perform work simulated tasks (typing, using mouse, etc) with RUE as dominant hand with 90% accuracy.    Time 8    Period Weeks    Status On-going      OT LONG TERM GOAL #4   Title Pt will report completing at least 1 home management task per day with mod I in order to decrease caregiver burden.    Time 8    Period Weeks    Status Achieved      OT LONG TERM GOAL #5   Title Pt will improve grip strength by at least 10 lbs in RUE for strengthening dominant hand for functional use and tasks.    Baseline RUE 45.4 (LUE 65.6)    Time 8    Period Weeks    Status On-going   52.9 RUE     OT LONG TERM GOAL #6   Title Pt will report using RUE (dominant hand) for at least 75% of functional tasks per day.    Time 8    Period Weeks    Status On-going      OT LONG TERM GOAL #7   Title Pt will perform physical and cognitive tasks simultaneously with 95% accuracy for returning to prior tasks.    Time 8    Period Weeks    Status On-going      OT LONG TERM GOAL #8   Title Pt will write 3-5 sentences with 90% legibility and appropriate sizing.    Time 8    Period Weeks    Status On-going                 Plan - 07/16/20 1122    Clinical Impression Statement Pt is progressing towards goals and increasing in coordination and strength with RUE    OT Occupational Profile and History Detailed Assessment- Review of Records and additional review of physical, cognitive, psychosocial history related to current functional performance    Occupational performance deficits (Please refer to  evaluation for details): IADL's;ADL's;Social Participation;Leisure;Work;Rest and Sleep    Body Structure / Function / Physical Skills ADL;Dexterity;Decreased knowledge of use of DME;Strength;UE functional use;Endurance;IADL;Coordination;FMC    Rehab Potential Good    Clinical Decision Making Limited treatment options, no task modification necessary    Comorbidities Affecting Occupational Performance: None    Modification or Assistance to Complete Evaluation  No modification of tasks or assist necessary to complete eval    OT Frequency 2x / week    OT Duration 8 weeks   or 16 visits (+ eval) over extended time   OT Treatment/Interventions Self-care/ADL training;DME and/or AE instruction;Gait Training;Therapeutic activities;Balance training;Cognitive remediation/compensation;Therapeutic exercise;Neuromuscular education;Passive range of motion;Visual/perceptual remediation/compensation;Patient/family education;Energy conservation    Plan dual task (phys/cog), RUE coordination and grip strength, add visits at renewal (07/26/20)    OT Home Exercise Plan coordination HEP    Consulted and Agree with Plan of Care Patient;Family member/caregiver   spouse   Family Member Consulted spouse           Patient will benefit from skilled therapeutic intervention in order to improve the following deficits and impairments:   Body Structure / Function / Physical Skills: ADL,Dexterity,Decreased  knowledge of use of DME,Strength,UE functional use,Endurance,IADL,Coordination,FMC       Visit Diagnosis: Muscle weakness (generalized)  Hemiplegia and hemiparesis following cerebral infarction affecting right dominant side (HCC)  Other lack of coordination  Attention and concentration deficit  Other abnormalities of gait and mobility    Problem List Patient Active Problem List   Diagnosis Date Noted  . Stroke ([place]) 05/16/2020  . Acute focal neurological deficit 05/15/2020  . BPH (benign prostatic  hyperplasia) 02/02/2016  . Fractured atrial pacemaker lead wire 07/18/2015  . 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 Chest pain 08/02/2013  . SOB (shortness of breath) 08/02/2013  . PVC (premature ventricular contraction) 02/13/2013  . Chest pain on exertion 02/25/2012  . Pacemaker-dual-chamber Medtronic 04/29/2011  . Change in bowel habits 01/22/2011  . Weight loss 01/22/2011  . COUGH 09/01/2010  . Multiple sclerosis ([place])   . HOCM (hypertrophic obstructive cardiomyopathy) ([place])   . Right bundle branch block   . Complete heart block ([place]) 12/05/2007    Zachery Conch MOT, OTR/L  07/16/2020, 12:18 PM  [facility] [address] [place], [place], [address] Phone: [phone]   Fax:  [phone]  Name: CORDERA STINEMAN MRN: [id number] Date of Birth: 07-07-1955

## 2020-07-16 NOTE — Patient Instructions (Signed)
Access Code: [email or web address] URL: [email or web address] Date: 07/16/2020 Prepared by: Cherly Anderson  Exercises Side Stepping with Counter Support - 2 x daily - 7 x weekly - 1 sets - 4 reps Mini Squat with Counter Support - 2 x daily - 5 x weekly - 2 sets - 10 reps Standing Marching - 2 x daily - 5 x weekly - 2 sets - 10 reps Tandem Stance - 2 x daily - 5 x weekly - 1 sets - 3 reps - 20 sec hold Romberg Stance with Head Nods - 1 x daily - 5 x weekly - 2 sets - 10 reps Wide Stance with Eyes Closed on Foam Pad - 1 x daily - 5 x weekly - 3 sets - 30 hold Alternating tapping cup at counter - 1 x daily - 5 x weekly - 2 sets - 10 reps Standing Knee Flexion AROM with Chair Support - 2 x daily - 5 x weekly - 1 sets - 10 reps Prone Knee Flexion - 1 x daily - 5 x weekly - 2 sets - 10 reps

## 2020-07-16 NOTE — Patient Instructions (Signed)

## 2020-07-17 ENCOUNTER — Telehealth: Payer: Self-pay

## 2020-07-17 NOTE — Telephone Encounter (Signed)
   Rodney Adkins DOB: 1955-12-17 MRN: [id number]   RIDER WAIVER AND RELEASE OF LIABILITY  For purposes of improving physical access to our facilities, [facility] is pleased to partner with third parties to provide [facility] patients or other authorized individuals the option of convenient, on-demand ground transportation services (the Technical brewer") through use of the technology service that enables users to request on-demand ground transportation from independent third-party providers.  By opting to use and accept these [employer], I, the undersigned, hereby agree on behalf of myself, and on behalf of any minor child using the [employer] for whom I am the parent or legal guardian, as follows:  1. Government social research officer provided to me are provided by independent third-party transportation providers who are not [employer] or employees and who are unaffiliated with [employer]. 2. [place] [facility] is neither a transportation carrier nor a common or public carrier. 3. [facility] has no control over the quality or safety of the transportation that occurs as a result of the [employer]. 4. [facility] cannot guarantee that any third-party transportation provider will complete any arranged transportation service. 5. [facility] makes no representation, warranty, or guarantee regarding the reliability, timeliness, quality, safety, suitability, or availability of any of the Transport Services or that they will be error free. 6. I fully understand that traveling by vehicle involves risks and dangers of serious bodily injury, including permanent disability, paralysis, and death. I agree, on behalf of myself and on behalf of any minor child using the Transport Services for whom I am the parent or legal guardian, that the entire risk arising out of my use of the [employer] remains solely with me, to the maximum extent permitted under applicable law. 7. The [employer] are provided "as is" and "as available." [facility] disclaims all representations and warranties, express, implied or statutory, not expressly set out in these terms, including the implied warranties of merchantability and fitness for a particular purpose. 8. I hereby waive and release [facility], its agents, employees, officers, directors, representatives, insurers, attorneys, assigns, successors, subsidiaries, and affiliates from any and all past, present, or future claims, demands, liabilities, actions, causes of action, or suits of any kind directly or indirectly arising from acceptance and use of the [employer]. 9. I further waive and release [facility] and its affiliates from all present and future liability and responsibility for any injury or death to persons or damages to property caused by or related to the use of the [employer]. 10. I have read this Waiver and Release of Liability, and I understand the terms used in it and their legal significance. This Waiver is freely and voluntarily given with the understanding that my right (as well as the right of any minor child for whom I am the parent or legal guardian using the [employer]) to legal recourse against [facility] in connection with the [employer] is knowingly surrendered in return for use of these services.   I attest that I read the consent document to Rodney Adkins, gave Mr. Garguilo the opportunity to ask questions and answered the questions asked (if any). I affirm that Rodney Adkins then provided consent for he's participation in this program.     No Pcp

## 2020-07-18 ENCOUNTER — Other Ambulatory Visit: Payer: Self-pay

## 2020-07-18 ENCOUNTER — Encounter: Payer: Self-pay | Admitting: Occupational Therapy

## 2020-07-18 ENCOUNTER — Ambulatory Visit: Payer: BC Managed Care – PPO | Admitting: Occupational Therapy

## 2020-07-18 ENCOUNTER — Ambulatory Visit: Payer: BC Managed Care – PPO

## 2020-07-18 DIAGNOSIS — R2689 Other abnormalities of gait and mobility: Secondary | ICD-10-CM | POA: Diagnosis not present

## 2020-07-18 DIAGNOSIS — I69351 Hemiplegia and hemiparesis following cerebral infarction affecting right dominant side: Secondary | ICD-10-CM

## 2020-07-18 DIAGNOSIS — M6281 Muscle weakness (generalized): Secondary | ICD-10-CM

## 2020-07-18 DIAGNOSIS — R278 Other lack of coordination: Secondary | ICD-10-CM

## 2020-07-18 DIAGNOSIS — R41840 Attention and concentration deficit: Secondary | ICD-10-CM

## 2020-07-18 NOTE — Therapy (Signed)
[facility] [address] [place], [place], [address] Phone: [phone]   Fax:  [phone]  Occupational Therapy Treatment  Patient Details  Name: Rodney Adkins MRN: [id number] Date of Birth: May 07, 1956 Referring Provider (OT): Darliss Cheney, MD   Encounter Date: 07/18/2020   OT End of Session - 07/18/20 1101    Visit Number 14    Number of Visits 17    Date for OT Re-Evaluation 07/26/20   changed to 07/26/20 d/t scheduling.   Authorization Type State BCBS    Authorization Time Period VL:[place] Week 7 of 8 (1/11)    OT Start Time 1101    OT Stop Time 1145    OT Time Calculation (min) 44 min    Activity Tolerance Patient tolerated treatment well    Behavior During Therapy WFL for tasks assessed/performed           Past Medical History:  Diagnosis Date  . Complete heart block [facility]) June 2009   a. Presented with hypotension/pulm edema 12/2007 when HOCM was also discovered. s/p dual chamber MDT pacemaker. Thallium stest 7/09 negative for infiltrative disease or ischemia.  Followed Dr [name]  Marland Kitchen Heart murmur   . HOCM (hypertrophic obstructive cardiomyopathy) ([place]) 2009   Discovered by ECHO when he developed CHB. Mgmt Dr Caryl Comes  and Dr [name] at [facility]. Has outflow obstruction on ECHO 2011  . Multiple sclerosis ([place]) 1998   Sxs started 1998 with L body numbness. MRI c/w MS. Started Avonex. Saw Dr [name] at [facility] and switched to Betaseron and was on for 5 yrs but developed depression and site rxns and D/C'd 2006. While on Betaseron, occassionally required solumedrol for flares.  . Pacemaker 2017  . Prostate enlargement   . Right bundle branch block   . SOB (shortness of breath)    a. PFTs 09/2010 essentially normal per pulm note (mild obst defect). b. 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  . Wenckebach    a. Decreased ex tolerance 2010/2012 - underwent stress echo to look @ effects of MV/LVOT - nothing noted;  although pt had high rate Wenckebach behavior with associated ex intol. Pacer reprogrammed with improvement in sx.    Past Surgical History:  Procedure Laterality Date  . LEFT HEART CATHETERIZATION WITH CORONARY ANGIOGRAM N/A 08/22/2013   Procedure: LEFT HEART CATHETERIZATION WITH CORONARY ANGIOGRAM;  Surgeon: [name], MD;  Location: [facility] CATH LAB;  Service: Cardiovascular;  Laterality: N/A;  . LEG TENDON SURGERY  2008   Os peroneus resection and peroneal tendon repair  . pace Agricultural engineer    . PACEMAKER INSERTION  12/2007   Complete heart block  . PACEMAKER LEAD REMOVAL Left 07/18/2015   Procedure: ATRIAL PACEMAKER LEAD EXTRACTION; ATRIAL PACEMAKER LEAD INSERTION; GENERATOR REPLACEMENT.;  Surgeon: [name], MD;  Location: [facility];  Service: Cardiovascular;  Laterality: Left;  [name] to back up  . PERIPHERAL VASCULAR CATHETERIZATION N/A 02/12/2015   Procedure: Venogram;  Surgeon: [name], MD;  Location: [facility] CV LAB;  Service: Cardiovascular;  Laterality: N/A;  . TEE WITHOUT CARDIOVERSION N/A 07/18/2015   Procedure: TRANSESOPHAGEAL ECHOCARDIOGRAM (TEE);  Surgeon: [name], MD;  Location: [facility] OR;  Service: Cardiovascular;  Laterality: N/A;    There were no vitals filed for this visit.   Subjective Assessment - 07/18/20 1101    Subjective  Pt denies any pain and denies any changes since last visit.    Patient is accompanied by: Family member  spouse   Pertinent History Fall Risk. Speech Deficits.    Limitations MS, CHB s/p PPM, HOCM    Patient Stated Goals right hand. 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 Return to driving recommendations.    Currently in Pain? No/denies                        OT Treatments/Exercises (OP) - 07/18/20 1102      Fine Motor Coordination (Hand/Wrist)   Fine Motor Coordination Purdue Pegboard;Nuts and Bolts;In hand manipuation training    In Hand Manipulation Training rotating  golf ball x 1 in hand counterclockwise and clockwise and golf ball x 2 in clockwise direction x 4    Nuts and Bolts w emphasis of using RUE for screwing.unscrewing and using LUE for support - moderate drops - activity required increased time    Purdue Pegboard manipulating the pegs with RUE moderate drops. pt required increased time for activity.                    OT Short Term Goals - 07/09/20 1108      OT SHORT TERM GOAL #1   Title Pt will be independent with HEP 06/21/20    Time 4    Period Weeks    Status Achieved    Target Date 06/21/20      OT SHORT TERM GOAL #2   Title Pt will demonstrate improved fine motor coordination by demonstrating decrease in 9 hole peg test score by 6 seconds with RUE.    Baseline RUE 71.06    Time 4    Period Weeks    Status Achieved      OT SHORT TERM GOAL #3   Title Pt will perform simple warm meal prep or home management task with supervision and good safety awareness.    Time 4    Period Weeks    Status Achieved      OT SHORT TERM GOAL #4   Title Pt will write 2-3 sentences with 75% legibility and increased size of letters (mild micrographia).    Baseline severe micrographia    Time 4    Period Weeks    Status Achieved   3 sentences with good letter size and legibility     OT SHORT TERM GOAL #5   Title Pt will demonstrate increase in grip strength by 5 lbs in RUE for increased ease with ADLs and clothing management, etc.    Baseline RUE 45.4 (LUE 65.6)    Time 4    Period Weeks    Status Achieved   RUE 55.7 lbs     OT SHORT TERM GOAL #6   Title Pt will report using RUE (dominant hand) for 25% or more of functional tasks.    Time 4    Period Weeks    Status Achieved             OT Long Term Goals - 07/09/20 1108      OT LONG TERM GOAL #1   Title Pt will be independent with updated HEP 07/19/2020    Time 8    Period Weeks    Status On-going      OT LONG TERM GOAL #2   Title Pt will demonstrate improved fine  motor coordination by demonstrating decrease in 9 hole peg test score by 15 seconds with RUE.    Time 8    Period Weeks  Status Achieved      OT LONG TERM GOAL #3   Title Pt will perform work simulated tasks (typing, using mouse, etc) with RUE as dominant hand with 90% accuracy.    Time 8    Period Weeks    Status On-going      OT LONG TERM GOAL #4   Title Pt will report completing at least 1 home management task per day with mod I in order to decrease caregiver burden.    Time 8    Period Weeks    Status Achieved      OT LONG TERM GOAL #5   Title Pt will improve grip strength by at least 10 lbs in RUE for strengthening dominant hand for functional use and tasks.    Baseline RUE 45.4 (LUE 65.6)    Time 8    Period Weeks    Status On-going   52.9 RUE     OT LONG TERM GOAL #6   Title Pt will report using RUE (dominant hand) for at least 75% of functional tasks per day.    Time 8    Period Weeks    Status On-going      OT LONG TERM GOAL #7   Title Pt will perform physical and cognitive tasks simultaneously with 95% accuracy for returning to prior tasks.    Time 8    Period Weeks    Status On-going      OT LONG TERM GOAL #8   Title Pt will write 3-5 sentences with 90% legibility and appropriate sizing.    Time 8    Period Weeks    Status On-going                 Plan - 07/18/20 1123    Clinical Impression Statement Pt is progressing steadily towards goals. Renewal set to complete next week and assess all goals.    OT Occupational Profile and History Detailed Assessment- Review of Records and additional review of physical, cognitive, psychosocial history related to current functional performance    Occupational performance deficits (Please refer to evaluation for details): IADL's;ADL's;Social Participation;Leisure;Work;Rest and Sleep    Body Structure / Function / Physical Skills ADL;Dexterity;Decreased knowledge of use of DME;Strength;UE functional  use;Endurance;IADL;Coordination;FMC    Rehab Potential Good    Clinical Decision Making Limited treatment options, no task modification necessary    Comorbidities Affecting Occupational Performance: None    Modification or Assistance to Complete Evaluation  No modification of tasks or assist necessary to complete eval    OT Frequency 2x / week    OT Duration 8 weeks   or 16 visits (+ eval) over extended time   OT Treatment/Interventions Self-care/ADL training;DME and/or AE instruction;Gait Training;Therapeutic activities;Balance training;Cognitive remediation/compensation;Therapeutic exercise;Neuromuscular education;Passive range of motion;Visual/perceptual remediation/compensation;Patient/family education;Energy conservation    Plan dual task (phys/cog), RUE coordination and grip strength, add visits at renewal (07/26/20)    OT Home Exercise Plan coordination HEP    Consulted and Agree with Plan of Care Patient;Family member/caregiver   spouse   Family Member Consulted spouse           Patient will benefit from skilled therapeutic intervention in order to improve the following deficits and impairments:   Body Structure / Function / Physical Skills: ADL,Dexterity,Decreased knowledge of use of DME,Strength,UE functional use,Endurance,IADL,Coordination,FMC       Visit Diagnosis: Muscle weakness (generalized)  Other lack of coordination  Hemiplegia and hemiparesis following cerebral infarction affecting right dominant side (HCC)  Attention  and concentration deficit    Problem List Patient Active Problem List   Diagnosis Date Noted  . Stroke ([place]) 05/16/2020  . Acute focal neurological deficit 05/15/2020  . BPH (benign prostatic hyperplasia) 02/02/2016  . Fractured atrial pacemaker lead wire 07/18/2015  . 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 Chest pain 08/02/2013  . SOB (shortness of breath) 08/02/2013  . PVC (premature ventricular contraction) 02/13/2013  . Chest pain on  exertion 02/25/2012  . Pacemaker-dual-chamber [employer] 04/29/2011  . Change in bowel habits 01/22/2011  . Weight loss 01/22/2011  . COUGH 09/01/2010  . Multiple sclerosis ([place])   . HOCM (hypertrophic obstructive cardiomyopathy) ([place])   . Right bundle branch block   . Complete heart block ([place]) 12/05/2007    Zachery Conch MOT, OTR/L  07/18/2020, 11:53 AM  [facility] [address] [place], [place], [address] Phone: [phone]   Fax:  [phone]  Name: Rodney Adkins MRN: [id number] Date of Birth: Dec 10, 1955

## 2020-07-18 NOTE — Therapy (Signed)
[facility] [address] [place], [place], [address] Phone: [phone]   Fax:  [phone]  Physical Therapy Treatment  Patient Details  Name: Rodney Adkins MRN: [id number] Date of Birth: [birth year] Referring Provider (PT): Otto Herb, MD   Encounter Date: 07/18/2020   PT End of Session - 07/18/20 1021    Visit Number 17    Number of Visits 27    Authorization Type BCBS PPO - FOTO patient    PT Start Time 1019    PT Stop Time 1057    PT Time Calculation (min) 38 min    Equipment Utilized During Treatment Gait belt    Activity Tolerance Patient tolerated treatment well    Behavior During Therapy WFL for tasks assessed/performed           Past Medical History:  Diagnosis Date  . Complete heart block [facility]) June 2009   a. Presented with hypotension/pulm edema 12/2007 when HOCM was also discovered. s/p dual chamber MDT pacemaker. Thallium stest 7/09 negative for infiltrative disease or ischemia.  Followed Dr [name]  Marland Kitchen Heart murmur   . HOCM (hypertrophic obstructive cardiomyopathy) ([place]) 2009   Discovered by ECHO when he developed CHB. Mgmt Dr Caryl Comes  and Dr [name] at [facility]. Has outflow obstruction on ECHO 2011  . Multiple sclerosis ([place]) 1998   Sxs started 1998 with L body numbness. MRI c/w MS. Started Avonex. Saw Dr [name] at [facility] and switched to Betaseron and was on for 5 yrs but developed depression and site rxns and D/C'd 2006. While on Betaseron, occassionally required solumedrol for flares.  . Pacemaker 2017  . Prostate enlargement   . Right bundle branch block   . SOB (shortness of breath)    a. PFTs 09/2010 essentially normal per pulm note (mild obst defect). b. 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  . 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  Pacer reprogrammed with improvement in sx.    Past Surgical History:  Procedure Laterality Date  . LEFT HEART CATHETERIZATION WITH CORONARY ANGIOGRAM N/A 08/22/2013   Procedure: LEFT HEART CATHETERIZATION WITH CORONARY ANGIOGRAM;  Surgeon: [name], MD;  Location: [facility] CATH LAB;  Service: Cardiovascular;  Laterality: N/A;  . LEG TENDON SURGERY  2008   Os peroneus resection and peroneal tendon repair  . pace Agricultural engineer    . PACEMAKER INSERTION  12/2007   Complete heart block  . PACEMAKER LEAD REMOVAL Left 07/18/2015   Procedure: ATRIAL PACEMAKER LEAD EXTRACTION; ATRIAL PACEMAKER LEAD INSERTION; GENERATOR REPLACEMENT.;  Surgeon: [name], MD;  Location: [place];  Service: Cardiovascular;  Laterality: Left;  [name] to back up  . PERIPHERAL VASCULAR CATHETERIZATION N/A 02/12/2015   Procedure: Venogram;  Surgeon: [name], MD;  Location: [place] CV LAB;  Service: Cardiovascular;  Laterality: N/A;  . TEE WITHOUT CARDIOVERSION N/A 07/18/2015   Procedure: TRANSESOPHAGEAL ECHOCARDIOGRAM (TEE);  Surgeon: [name], MD;  Location: [facility] OR;  Service: Cardiovascular;  Laterality: N/A;    There were no vitals filed for this visit.   Subjective Assessment - 07/18/20 1022    Subjective Pt denies any new changes.    Patient is accompained by: Family member    Pertinent History MS, CHB s/p PPM, HOCM    Limitations Walking;Writing;Standing;Lifting;House hold activities    Patient Stated  Goals wants to be able to walk better.    Currently in Pain? No/denies                             [facility] Adult PT Treatment/Exercise - 07/18/20 1022      Ambulation/Gait   Ambulation/Gait Yes    Ambulation/Gait Assistance 5: Supervision    Ambulation/Gait Assistance Details Verbal cues to increase step length and heel strike on right with more arm swing as well.    Ambulation Distance (Feet) 460 Feet    Assistive device None    Gait Pattern Step-through pattern;Decreased hip/knee  flexion - right;Decreased dorsiflexion - right    Ambulation Surface Level;Indoor      Neuro Re-ed    Neuro Re-ed Details  Dynamic gait activities in hallway: gait with head turns left/right on command 40' x 4, head turns up/down 40' x 4, marching gait 40' x 2, side stepping 40' x 2, backwards gait 40' x 2. Pt was cued to tighten gluts to help with stability on right during SLS components. Pt had decreased step length and decreased stability when adding in head turns with slower cadence. CGA for safety with activities. Pt needed 1 seated rest break with activities.      Exercises   Exercises Other Exercises    Other Exercises  sidelying for right hip abd x 10 with verbal and tactile cues for form x 10, right clamshell with red theraband x 10. At bottom of steps: tapping 2nd step with RLE with PT providing resistance at ankle with red theraband x 10.                  PT Education - 07/18/20 1117    Education Details PT discussed with pt adding in sidelying clamshell with red theraband or sidelying hip abduction without. Pt familiar with these from yoga in the past. Advised to do 3-4x/week.    Person(s) Educated Patient    Methods Explanation    Comprehension Verbalized understanding;Returned demonstration            PT Short Term Goals - 07/12/20 1045      PT SHORT TERM GOAL #1   Title Pt will improve DGI score to at least a 16/24 in order to demo decr fall risk. ALL STGS DUE 08/02/20    Baseline 13/24    Time 3    Period Weeks    Status New    Target Date 08/02/20      PT SHORT TERM GOAL #2   Title Pt will decrease 5xSTS to </= 15 seconds for decreased fall risk.    Baseline 07/09/20 16.62 sec    Time 3    Period Weeks    Status Revised      PT SHORT TERM GOAL #3   Title Pt will increase gait speed to >/= 0.8 m/s with single walking stick for increased access to community and decreased fall risk.    Baseline .69 m/s with walking stick    Time 3    Period Weeks     Status Revised      PT SHORT TERM GOAL #4   Title Pt will decr TUG to 13 seconds or less with no AD in order to demo decr fall risk.    Baseline 14.19 seconds with no AD    Time 3    Period Weeks    Status New  PT Long Term Goals - 07/12/20 1027      PT LONG TERM GOAL #1   Title Pt will be independent with progressive HEP. ALL LTGS DUE 08/23/20    Baseline added exercises with ankle weights to HEP, pt will benefit from on-going additions    Time 6    Period Weeks    Status On-going    Target Date 08/23/20      PT LONG TERM GOAL #2   Title Pt will decrease 5xSTS to </= 12 seconds to no longer be classified as fall risk and to be WNL for age and gender.    Baseline 22 s from standard arm chair without UE support. 06/19/20 17.37 sec, 07/09/20 16.62 sec    Time 6    Period Weeks    Status On-going      PT LONG TERM GOAL #3   Title Pt will improve score on DGI to at least a 19/24 in order to demo decr fall risk.    Baseline 13/24    Time 6    Period Weeks    Status New      PT LONG TERM GOAL #4   Title Pt will reach .90 m/s gait speed for community ambulation and decreased fall risk with LRAD vs. no AD    Baseline 0.65 m/s. 06/19/20 0.25m/s with walker and 0.62m/s with cane, .69 m/s with walking stick    Time 6    Period Weeks    Status Revised      PT LONG TERM GOAL #5   Title Pt will ambulated 800+ feet on outdoor/unlevel surfaces with supervision and LRAD vs. no AD.    Baseline NT, 400' with supervision and walking stick (limited distance due to cold weather conditions)    Time 6    Period Weeks    Status On-going      PT LONG TERM GOAL #6   Title Pt will increase FOTO from 46 to >/= 70 for increased confidence in abilities.    Baseline 46 05/21/20    Time 6    Period Weeks    Status On-going                 Plan - 07/18/20 1118    Clinical Impression Statement PT continued to progress gait activities with added in more tasks. Pt had  decreased stability with head turns with gait decreasing his step length and cadence. Right leg did fatigue as went on with tasks but only needed 1 seated rest break.    Personal Factors and Comorbidities Comorbidity 3+;Age    Comorbidities MS, CHB s/p PPM, HOCM    Examination-Activity Limitations Squat;Locomotion Level;Transfers;Stairs;Stand    Examination-Participation Restrictions Yard Work;Occupation;Driving;Community Activity    Stability/Clinical Decision Making Evolving/Moderate complexity    Rehab Potential Good    PT Frequency 2x / week    PT Duration 6 weeks    PT Treatment/Interventions Gait training;Stair training;Functional mobility training;Therapeutic activities;Therapeutic exercise;Balance training;Neuromuscular re-education;Patient/family education;Orthotic Fit/Training;DME Instruction;ADLs/Self Care Home Management;Energy conservation;Manual techniques;Vestibular    PT Next Visit Plan Gait training without AD incorporating more dynamic gait activities with reciprocal steps/head turns.  floor <> stand transfers. corner balance exercises and on compliant surfaces, RLE strength    Consulted and Agree with Plan of Care Patient;Family member/caregiver    Family Member Consulted Wife           Patient will benefit from skilled therapeutic intervention in order to improve the following deficits and impairments:  Abnormal  gait,Decreased coordination,Difficulty walking,Decreased endurance,Decreased activity tolerance,Impaired perceived functional ability,Decreased balance,Decreased mobility,Decreased strength,Improper body mechanics  Visit Diagnosis: Other abnormalities of gait and mobility  Muscle weakness (generalized)  Hemiplegia and hemiparesis following cerebral infarction affecting right dominant side [facility])     Problem List Patient Active Problem List   Diagnosis Date Noted  . Stroke ([place]) 05/16/2020  . Acute focal neurological deficit 05/15/2020  . BPH (benign  prostatic hyperplasia) 02/02/2016  . Fractured atrial pacemaker lead wire 07/18/2015  . 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 Chest pain 08/02/2013  . SOB (shortness of breath) 08/02/2013  . PVC (premature ventricular contraction) 02/13/2013  . Chest pain on exertion 02/25/2012  . Pacemaker-dual-chamber Medtronic 04/29/2011  . Change in bowel habits 01/22/2011  . Weight loss 01/22/2011  . COUGH 09/01/2010  . Multiple sclerosis ([place])   . HOCM (hypertrophic obstructive cardiomyopathy) ([place])   . Right bundle branch block   . Complete heart block ([place]) 12/05/2007    Electa Sniff, PT, DPT, NCS 07/18/2020, 11:21 AM  [facility] [address] [place], [place], [address] Phone: [phone]   Fax:  [phone]  Name: Rodney Adkins MRN: [id number] Date of Birth: 02/07/1956

## 2020-07-23 ENCOUNTER — Ambulatory Visit: Payer: BC Managed Care – PPO | Admitting: Occupational Therapy

## 2020-07-23 ENCOUNTER — Ambulatory Visit: Payer: BC Managed Care – PPO

## 2020-07-26 ENCOUNTER — Ambulatory Visit: Payer: BC Managed Care – PPO

## 2020-07-26 ENCOUNTER — Ambulatory Visit: Payer: BC Managed Care – PPO | Admitting: Occupational Therapy

## 2020-07-26 ENCOUNTER — Other Ambulatory Visit: Payer: Self-pay

## 2020-07-26 DIAGNOSIS — R2689 Other abnormalities of gait and mobility: Secondary | ICD-10-CM

## 2020-07-26 DIAGNOSIS — R471 Dysarthria and anarthria: Secondary | ICD-10-CM

## 2020-07-26 DIAGNOSIS — M6281 Muscle weakness (generalized): Secondary | ICD-10-CM

## 2020-07-26 DIAGNOSIS — I69351 Hemiplegia and hemiparesis following cerebral infarction affecting right dominant side: Secondary | ICD-10-CM

## 2020-07-26 DIAGNOSIS — R490 Dysphonia: Secondary | ICD-10-CM

## 2020-07-26 NOTE — Therapy (Signed)
[facility] [address] [place], [place], [address] Phone: [phone]   Fax:  [phone]  Physical Therapy Treatment  Patient Details  Name: Rodney Adkins MRN: [id number] Date of Birth: 02-Mar-1956 Referring Provider (PT): Anselm Pancoast, MD   Encounter Date: 07/26/2020   PT End of Session - 07/26/20 1105    Visit Number 18    Number of Visits 27    Authorization Type BCBS PPO - FOTO patient    PT Start Time 1103    PT Stop Time 1145    PT Time Calculation (min) 42 min    Equipment Utilized During Treatment Gait belt    Activity Tolerance Patient tolerated treatment well    Behavior During Therapy [facility] for tasks assessed/performed           Past Medical History:  Diagnosis Date   Complete heart block [facility]) June 2009   a. Presented with hypotension/pulm edema 12/2007 when HOCM was also discovered. s/p dual chamber MDT pacemaker. Thallium stest 7/09 negative for infiltrative disease or ischemia.  Followed Dr Graciela Husbands.   Heart murmur    HOCM (hypertrophic obstructive cardiomyopathy) ([facility]) 2009   Discovered by ECHO when he developed CHB. Mgmt Dr Graciela Husbands  and Dr Regino Schultze at [facility]. Has outflow obstruction on ECHO 2011   Multiple sclerosis ([facility]) 1998   Sxs started 1998 with L body numbness. MRI c/w MS. Started Avonex. Saw Dr Leotis Shames at [facility] and switched to Betaseron and was on for 5 yrs but developed depression and site rxns and D/C'd 2006. While on Betaseron, occassionally required solumedrol for flares.   Pacemaker 2017   Prostate enlargement    Right bundle branch block    SOB (shortness of breath)    a. PFTs 09/2010 essentially normal per pulm note (mild obst defect). b. 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  Pacer reprogrammed with improvement in sx.    Past Surgical History:  Procedure Laterality Date   LEFT HEART CATHETERIZATION WITH CORONARY ANGIOGRAM N/A 08/22/2013   Procedure: LEFT HEART CATHETERIZATION WITH CORONARY ANGIOGRAM;  Surgeon: Lesleigh Noe, MD;  Location: [facility] CATH LAB;  Service: Cardiovascular;  Laterality: N/A;   LEG TENDON SURGERY  2008   Os peroneus resection and peroneal tendon repair   pace maker     PACEMAKER INSERTION  12/2007   Complete heart block   PACEMAKER LEAD REMOVAL Left 07/18/2015   Procedure: ATRIAL PACEMAKER LEAD EXTRACTION; ATRIAL PACEMAKER LEAD INSERTION; GENERATOR REPLACEMENT.;  Surgeon: Marinus Maw, MD;  Location: [facility] OR;  Service: Cardiovascular;  Laterality: Left;  [name] to back up   PERIPHERAL VASCULAR CATHETERIZATION N/A 02/12/2015   Procedure: Venogram;  Surgeon: [name], MD;  Location: [facility] INVASIVE CV LAB;  Service: Cardiovascular;  Laterality: N/A;   TEE WITHOUT CARDIOVERSION N/A 07/18/2015   Procedure: TRANSESOPHAGEAL ECHOCARDIOGRAM (TEE);  Surgeon: Marinus Maw, MD;  Location: [facility] OR;  Service: Cardiovascular;  Laterality: N/A;    There were no vitals filed for this visit.   Subjective Assessment - 07/26/20 1105    Subjective Reports that he has been working on the new hip exercises. Bending knee is the hardest. Had a fall yesterday going down steps when slipped on ice. No injuries.    Patient is accompained by:  Family member    Pertinent History MS, CHB s/p PPM, HOCM    Limitations Walking;Writing;Standing;Lifting;House hold activities    Patient Stated Goals wants to be able to walk better.    Currently in Pain? No/denies                             [facility] Adult PT Treatment/Exercise - 07/26/20 1106      Ambulation/Gait   Ambulation/Gait Yes    Ambulation/Gait Assistance 5: Supervision    Ambulation/Gait Assistance Details around in clinic without AD.    Assistive device None    Gait Pattern  Step-through pattern;Decreased hip/knee flexion - right;Decreased stance time - right    Ambulation Surface Level;Indoor      Neuro Re-ed    Neuro Re-ed Details  Dynamic gait activities in hallway without AD: gait with head turns left/right 40' x 4, head turns up/down 40' x 4, large steps 40'x 2 all CGA for safety. Pt has decreased step length with head turns and some instability. Reciprocal steps over 4 large hurdles on blue mat x 6 bouts with walking stick. Reciprocal steps over 3 cones tapping with each foot prior to stepping over x 6 bouts. Backwards gait x 25' CGA then in // bars with red theraband around LLE posterior and anterior gait working on increasing step and controlling without leaning forwards.      Exercises   Exercises Other Exercises    Other Exercises  In // bars: Standing right hip extension with red theraband resistance at ankle 10 x 2, hamstring curl right 10 x 2. Pt's leg fatigued as went on. Side stepping with red theraband around ankles with PT providing tactile cues to prevent hip hike on right with abduction. Step-ups on 6" step with RLE with 1 UE support x 10 with verbal cues to prevent circumduction when stepping up.      Knee/Hip Exercises: Aerobic   Other Aerobic SciFit x 6 min level 3 legs only for strengthening and endurance.                    PT Short Term Goals - 07/12/20 1045      PT SHORT TERM GOAL #1   Title Pt will improve DGI score to at least a 16/24 in order to demo decr fall risk. ALL STGS DUE 08/02/20    Baseline 13/24    Time 3    Period Weeks    Status New    Target Date 08/02/20      PT SHORT TERM GOAL #2   Title Pt will decrease 5xSTS to </= 15 seconds for decreased fall risk.    Baseline 07/09/20 16.62 sec    Time 3    Period Weeks    Status Revised      PT SHORT TERM GOAL #3   Title Pt will increase gait speed to >/= 0.8 m/s with single walking stick for increased access to community and decreased fall risk.    Baseline .69  m/s with walking stick    Time 3    Period Weeks    Status Revised      PT SHORT TERM GOAL #4   Title Pt will decr TUG to 13 seconds or less with no AD in order to demo decr fall risk.    Baseline 14.19 seconds with no AD    Time 3    Period Weeks    Status  New             PT Long Term Goals - 07/12/20 1027      PT LONG TERM GOAL #1   Title Pt will be independent with progressive HEP. ALL LTGS DUE 08/23/20    Baseline added exercises with ankle weights to HEP, pt will benefit from on-going additions    Time 6    Period Weeks    Status On-going    Target Date 08/23/20      PT LONG TERM GOAL #2   Title Pt will decrease 5xSTS to </= 12 seconds to no longer be classified as fall risk and to be WNL for age and gender.    Baseline 22 s from standard arm chair without UE support. 06/19/20 17.37 sec, 07/09/20 16.62 sec    Time 6    Period Weeks    Status On-going      PT LONG TERM GOAL #3   Title Pt will improve score on DGI to at least a 19/24 in order to demo decr fall risk.    Baseline 13/24    Time 6    Period Weeks    Status New      PT LONG TERM GOAL #4   Title Pt will reach .90 m/s gait speed for community ambulation and decreased fall risk with LRAD vs. no AD    Baseline 0.65 m/s. 06/19/20 0.67m/s with walker and 0.10m/s with cane, .69 m/s with walking stick    Time 6    Period Weeks    Status Revised      PT LONG TERM GOAL #5   Title Pt will ambulated 800+ feet on outdoor/unlevel surfaces with supervision and LRAD vs. no AD.    Baseline NT, 400' with supervision and walking stick (limited distance due to cold weather conditions)    Time 6    Period Weeks    Status On-going      PT LONG TERM GOAL #6   Title Pt will increase FOTO from 46 to >/= 70 for increased confidence in abilities.    Baseline 46 05/21/20    Time 6    Period Weeks    Status On-going                 Plan - 07/26/20 1344    Clinical Impression Statement PT continued to challenge  pt with gait activities with and without AD. He is challenged with head turns still with decreased step length. Has to really focus on increasing right hip flexion with stepping over obstacles.    Personal Factors and Comorbidities Comorbidity 3+;Age    Comorbidities MS, CHB s/p PPM, HOCM    Examination-Activity Limitations Squat;Locomotion Level;Transfers;Stairs;Stand    Examination-Participation Restrictions Yard Work;Occupation;Driving;Community Activity    Stability/Clinical Decision Making Evolving/Moderate complexity    Rehab Potential Good    PT Frequency 2x / week    PT Duration 6 weeks    PT Treatment/Interventions Gait training;Stair training;Functional mobility training;Therapeutic activities;Therapeutic exercise;Balance training;Neuromuscular re-education;Patient/family education;Orthotic Fit/Training;DME Instruction;ADLs/Self Care Home Management;Energy conservation;Manual techniques;Vestibular    PT Next Visit Plan STG check due end of week next week. Gait training without AD incorporating more dynamic gait activities with reciprocal steps/head turns.  floor <> stand transfers. corner balance exercises and on compliant surfaces, RLE strength    Consulted and Agree with Plan of Care Patient;Family member/caregiver    Family Member Consulted Wife           Patient will benefit  from skilled therapeutic intervention in order to improve the following deficits and impairments:  Abnormal gait,Decreased coordination,Difficulty walking,Decreased endurance,Decreased activity tolerance,Impaired perceived functional ability,Decreased balance,Decreased mobility,Decreased strength,Improper body mechanics  Visit Diagnosis: Muscle weakness (generalized)  Other abnormalities of gait and mobility  Hemiplegia and hemiparesis following cerebral infarction affecting right dominant side [facility])     Problem List Patient Active Problem List   Diagnosis Date Noted   Stroke ([place]) 05/16/2020    Acute focal neurological deficit 05/15/2020   BPH (benign prostatic hyperplasia) 02/02/2016   Fractured atrial pacemaker lead wire 07/18/2015   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 [place], [place], [address] Phone: [phone]   Fax:  [phone]  Name: Rodney Adkins MRN: [id number] Date of Birth: 17-Feb-1956

## 2020-07-26 NOTE — Therapy (Signed)
[facility] [address] [place], [place], [address] Phone: [phone]   Fax:  [phone]  Speech Language Pathology Treatment  Patient Details  Name: Rodney Adkins MRN: [id number] Date of Birth: [birth year] Referring Provider (SLP): Darliss Cheney,  MD   Encounter Date: 07/26/2020   End of Session - 07/26/20 1358    Visit Number 10    Number of Visits 17    Date for SLP Re-Evaluation 09/10/20    SLP Start Time 0937    SLP Stop Time  1018    SLP Time Calculation (min) 41 min    Activity Tolerance Patient tolerated treatment well           Past Medical History:  Diagnosis Date  . Complete heart block [facility]) June 2009   a. Presented with hypotension/pulm edema 12/2007 when HOCM was also discovered. s/p dual chamber MDT pacemaker. Thallium stest 7/09 negative for infiltrative disease or ischemia.  Followed Dr [name]  Marland Kitchen Heart murmur   . HOCM (hypertrophic obstructive cardiomyopathy) ([place]) 2009   Discovered by ECHO when he developed CHB. Mgmt Dr Caryl Comes  and Dr [name] at [facility]. Has outflow obstruction on ECHO 2011  . Multiple sclerosis ([place]) 1998   Sxs started 1998 with L body numbness. MRI c/w MS. Started Avonex. Saw Dr [name] at [facility] and switched to Betaseron and was on for 5 yrs but developed depression and site rxns and D/C'd 2006. While on Betaseron, occassionally required solumedrol for flares.  . Pacemaker 2017  . Prostate enlargement   . Right bundle branch block   . SOB (shortness of breath)    a. PFTs 09/2010 essentially normal per pulm note (mild obst defect). b. 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  . 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 Pacer reprogrammed with improvement in sx.    Past Surgical History:  Procedure Laterality Date  . LEFT  HEART CATHETERIZATION WITH CORONARY ANGIOGRAM N/A 08/22/2013   Procedure: LEFT HEART CATHETERIZATION WITH CORONARY ANGIOGRAM;  Surgeon: [name], MD;  Location: [facility] CATH LAB;  Service: Cardiovascular;  Laterality: N/A;  . LEG TENDON SURGERY  2008   Os peroneus resection and peroneal tendon repair  . pace Agricultural engineer    . PACEMAKER INSERTION  12/2007   Complete heart block  . PACEMAKER LEAD REMOVAL Left 07/18/2015   Procedure: ATRIAL PACEMAKER LEAD EXTRACTION; ATRIAL PACEMAKER LEAD INSERTION; GENERATOR REPLACEMENT.;  Surgeon: [name], MD;  Location: [facility];  Service: Cardiovascular;  Laterality: Left;  [name] to back up  . PERIPHERAL VASCULAR CATHETERIZATION N/A 02/12/2015   Procedure: Venogram;  Surgeon: [name], MD;  Location: [facility] CV LAB;  Service: Cardiovascular;  Laterality: N/A;  . TEE WITHOUT CARDIOVERSION N/A 07/18/2015   Procedure: TRANSESOPHAGEAL ECHOCARDIOGRAM (TEE);  Surgeon: [name], MD;  Location: [facility] OR;  Service: Cardiovascular;  Laterality: N/A;    There were no vitals filed for this visit.   Subjective Assessment - 07/26/20 1015    Subjective Pt has continued to complete HEPs for voice and for speech - pt's voice quality appears unchanged from previous session two weeks ago.    Currently in Pain? No/denies                 ADULT SLP TREATMENT - 07/26/20 1017  General Information   Behavior/Cognition Alert;Cooperative;Pleasant mood      Treatment Provided   Treatment provided Cognitive-Linquistic      Cognitive-Linquistic Treatment   Treatment focused on Dysarthria    Skilled Treatment Doug told SLP he has been very faithful in performing his PHorTE exercises since his last ST session two weeks ago. Upon entering ST room pt's articulation was not perfect but was such that SLP did not have to pause SLP's  attention to focus on any decr'd speech clarity in order to decipher pt's articulation. SLP targeted overarticulation in highly  detailed multi-sentence responses in order to simulate lectures. Doug req'd occasional min- mod A for slowed rate, faded to min A rarely for using slow rate.      Assessment / Recommendations / Plan   Plan Continue with current plan of care;Other (Comment)   SLP contacted PCP about referral to [facility]     Progression Toward Goals   Progression toward goals Progressing toward goals            SLP Education - 07/26/20 1358    Education Details rationale for ENT eval with [facility]    Person(s) Educated Patient    Methods Explanation    Comprehension Verbalized understanding            SLP Short Term Goals - 07/10/20 0010      SLP SHORT TERM GOAL #1   Title pt will undergo objective swallow assessment    Status Deferred      SLP SHORT TERM GOAL #2   Title pt will demonstrate dysarthria HEP with rare min A over 3 sessions    Period --   or 9 sessions, for all STGs   Status Partially Met      SLP SHORT TERM GOAL #3   Title pt will demonstrate voice HEP with rare min A over 3 sessions    Baseline 06-25-20, 07-02-20, 07-09-20    Status Achieved      SLP SHORT TERM GOAL #4   Title pt will engage in 8 mintues conversation maintaining gentle voice in 3 sesions, to limit possibility of habitualizing muscle tension dysphonia    Baseline 07-02-20    Time 1    Period Weeks    Status On-going      SLP SHORT TERM GOAL #5   Title pt will demonstrate speech compensations in sentence responses 80% of the time, in 3 sesssions    Baseline 07-02-20    Time 1    Period Weeks    Status On-going            SLP Long Term Goals - 07/26/20 1400      SLP LONG TERM GOAL #1   Title pt will demonstrate voice HEP with modified indpendnece in 3 sessions    Time 4    Period Weeks   or 17 sessions, for all LTGs   Status On-going      SLP LONG TERM GOAL #2   Title pt will engage in 12 mintues conversation maintaining gentle voice 80% of the time, to limit possibility of habitualizing  muscle tension dysphonia    Baseline 07-26-20    Time 4    Period Weeks    Status On-going      SLP LONG TERM GOAL #3   Title pt will engage in 12 mintues mod complex conversation using speech strategies 80% of the time, in 3 sessions    Time 4    Period Weeks  Status On-going      SLP LONG TERM GOAL #4   Title pt will demonstrate improved score on Voice-related quality of life (QOL) measure (indicating better QOL) from therapy session #1    Time 4    Period Weeks    Status On-going            Plan - 07/26/20 1358    Clinical Impression Statement Cullen "Marden Noble" continues to present today with mild flaccid dysarthria, and 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 Pt continues to work with PHOrTE exercises at home, regularly. Skilled ST is necessary to improve pt's swallowing, speech, adn voice quality. If pt's voice does not show improvement in late Januray a referral to ENT may be necessary.    Speech Therapy Frequency 2x / week    Duration 8 weeks   17 visits   Treatment/Interventions Aspiration precaution training;Pharyngeal strengthening exercises;Diet toleration management by SLP;Trials of upgraded texture/liquids;Cueing hierarchy;SLP instruction and feedback;Functional tasks;Oral motor exercises;Compensatory strategies;Patient/family education;Internal/external aids;Other (comment)    Potential to Achieve Goals Good           Patient will benefit from skilled therapeutic intervention in order to improve the following deficits and impairments:   Dysarthria and anarthria  Voice hoarseness    Problem List Patient Active Problem List   Diagnosis Date Noted  . Stroke ([place]) 05/16/2020  . Acute focal neurological deficit 05/15/2020  . BPH (benign prostatic hyperplasia) 02/02/2016  . Fractured atrial pacemaker lead wire 07/18/2015  . 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 Chest pain 08/02/2013  . SOB (shortness of breath)  08/02/2013  . PVC (premature ventricular contraction) 02/13/2013  . Chest pain on exertion 02/25/2012  . Pacemaker-dual-chamber Medtronic 04/29/2011  . Change in bowel habits 01/22/2011  . Weight loss 01/22/2011  . COUGH 09/01/2010  . Multiple sclerosis ([place])   . HOCM (hypertrophic obstructive cardiomyopathy) ([place])   . Right bundle branch block   . Complete heart block ([place]) 12/05/2007    [place] ,[place], [place]  07/26/2020, 2:01 PM  [facility] [address] [place], [place], [address] Phone: [phone]   Fax:  [phone]   Name: EFOSA TREICHLER MRN: [id number] Date of Birth: April 23, 1956

## 2020-07-30 ENCOUNTER — Other Ambulatory Visit: Payer: Self-pay

## 2020-07-30 ENCOUNTER — Ambulatory Visit: Payer: BC Managed Care – PPO

## 2020-07-30 ENCOUNTER — Ambulatory Visit: Payer: BC Managed Care – PPO | Admitting: Occupational Therapy

## 2020-07-30 DIAGNOSIS — R41840 Attention and concentration deficit: Secondary | ICD-10-CM

## 2020-07-30 DIAGNOSIS — R490 Dysphonia: Secondary | ICD-10-CM

## 2020-07-30 DIAGNOSIS — R2689 Other abnormalities of gait and mobility: Secondary | ICD-10-CM | POA: Diagnosis not present

## 2020-07-30 DIAGNOSIS — I69351 Hemiplegia and hemiparesis following cerebral infarction affecting right dominant side: Secondary | ICD-10-CM

## 2020-07-30 DIAGNOSIS — M6281 Muscle weakness (generalized): Secondary | ICD-10-CM

## 2020-07-30 DIAGNOSIS — R471 Dysarthria and anarthria: Secondary | ICD-10-CM

## 2020-07-30 DIAGNOSIS — R278 Other lack of coordination: Secondary | ICD-10-CM

## 2020-07-30 NOTE — Therapy (Signed)
[facility] [address] [place], [place], [address] Phone: [phone]   Fax:  [phone]  Physical Therapy Treatment  Patient Details  Name: Rodney Adkins MRN: [id number] Date of Birth: [birth year] Referring Provider (PT): Otto Herb, MD   Encounter Date: 07/30/2020   PT End of Session - 07/30/20 1106    Visit Number 19    Number of Visits 27    Authorization Type BCBS PPO - FOTO patient    PT Start Time 1104    PT Stop Time 1145    PT Time Calculation (min) 41 min    Equipment Utilized During Treatment Gait belt    Activity Tolerance Patient tolerated treatment well    Behavior During Therapy WFL for tasks assessed/performed           Past Medical History:  Diagnosis Date  . Complete heart block [facility]) June 2009   a. Presented with hypotension/pulm edema 12/2007 when HOCM was also discovered. s/p dual chamber MDT pacemaker. Thallium stest 7/09 negative for infiltrative disease or ischemia.  Followed Dr [name]  Marland Kitchen Heart murmur   . HOCM (hypertrophic obstructive cardiomyopathy) ([place]) 2009   Discovered by ECHO when he developed CHB. Mgmt Dr Caryl Comes  and Dr [name] at [facility]. Has outflow obstruction on ECHO 2011  . Multiple sclerosis ([place]) 1998   Sxs started 1998 with L body numbness. MRI c/w MS. Started Avonex. Saw Dr [name] at [facility] and switched to Betaseron and was on for 5 yrs but developed depression and site rxns and D/C'd 2006. While on Betaseron, occassionally required solumedrol for flares.  . Pacemaker 2017  . Prostate enlargement   . Right bundle branch block   . SOB (shortness of breath)    a. PFTs 09/2010 essentially normal per pulm note (mild obst defect). b. 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  . 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  Pacer reprogrammed with improvement in sx.    Past Surgical History:  Procedure Laterality Date  . LEFT HEART CATHETERIZATION WITH CORONARY ANGIOGRAM N/A 08/22/2013   Procedure: LEFT HEART CATHETERIZATION WITH CORONARY ANGIOGRAM;  Surgeon: [name], MD;  Location: [facility] CATH LAB;  Service: Cardiovascular;  Laterality: N/A;  . LEG TENDON SURGERY  2008   Os peroneus resection and peroneal tendon repair  . pace Agricultural engineer    . PACEMAKER INSERTION  12/2007   Complete heart block  . PACEMAKER LEAD REMOVAL Left 07/18/2015   Procedure: ATRIAL PACEMAKER LEAD EXTRACTION; ATRIAL PACEMAKER LEAD INSERTION; GENERATOR REPLACEMENT.;  Surgeon: [name], MD;  Location: [facility];  Service: Cardiovascular;  Laterality: Left;  [name] to back up  . PERIPHERAL VASCULAR CATHETERIZATION N/A 02/12/2015   Procedure: Venogram;  Surgeon: [name], MD;  Location: [facility] CV LAB;  Service: Cardiovascular;  Laterality: N/A;  . TEE WITHOUT CARDIOVERSION N/A 07/18/2015   Procedure: TRANSESOPHAGEAL ECHOCARDIOGRAM (TEE);  Surgeon: [name], MD;  Location: [facility] OR;  Service: Cardiovascular;  Laterality: N/A;    There were no vitals filed for this visit.   Subjective Assessment - 07/30/20 1105    Subjective Pt reports no changes.    Patient is accompained by: Family member    Pertinent History MS, CHB s/p PPM, HOCM    Limitations Walking;Writing;Standing;Lifting;House hold activities    Patient Stated Goals  wants to be able to walk better.    Currently in Pain? No/denies              [facility] PT Assessment - 07/30/20 1106      Functional Gait  Assessment   Gait assessed  Yes    Gait Level Surface Walks 20 ft, slow speed, abnormal gait pattern, evidence for imbalance or deviates 10-15 in outside of the 12 in walkway width. Requires more than 7 sec to ambulate 20 ft.    Change in Gait Speed Able to change speed, demonstrates mild gait deviations, deviates 6-10 in outside of the 12 in walkway width, or no  gait deviations, unable to achieve a major change in velocity, or uses a change in velocity, or uses an assistive device.    Gait with Horizontal Head Turns Performs head turns smoothly with slight change in gait velocity (eg, minor disruption to smooth gait path), deviates 6-10 in outside 12 in walkway width, or uses an assistive device.    Gait with Vertical Head Turns Performs head turns with no change in gait. Deviates no more than 6 in outside 12 in walkway width.    Gait and Pivot Turn Pivot turns safely within 3 sec and stops quickly with no loss of balance.    Step Over Obstacle Is able to step over one shoe box (4.5 in total height) without changing gait speed. No evidence of imbalance.    Gait with Narrow Base of Support Ambulates 4-7 steps.    Gait with Eyes Closed Walks 20 ft, slow speed, abnormal gait pattern, evidence for imbalance, deviates 10-15 in outside 12 in walkway width. Requires more than 9 sec to ambulate 20 ft.    Ambulating Backwards Walks 20 ft, uses assistive device, slower speed, mild gait deviations, deviates 6-10 in outside 12 in walkway width.    Steps Alternating feet, must use rail.    Total Score 19                         [facility] Adult PT Treatment/Exercise - 07/30/20 1106      Transfers   Transfers Sit to Stand;Stand to Sit    Sit to Stand 7: Independent    Five time sit to stand comments  11.71 sec without hands from chair    Stand to Sit 7: Independent      Ambulation/Gait   Ambulation/Gait Yes    Ambulation/Gait Assistance 5: Supervision    Ambulation/Gait Assistance Details around in clinic between activities    Assistive device None    Gait Pattern Step-through pattern;Decreased hip/knee flexion - right;Decreased stance time - right;Decreased arm swing - right    Ambulation Surface Level;Indoor      Standardized Balance Assessment   Standardized Balance Assessment Dynamic Gait Index      Dynamic Gait Index   Level Surface Mild  Impairment    Change in Gait Speed Mild Impairment    Gait with Horizontal Head Turns Mild Impairment    Gait with Vertical Head Turns Normal    Gait and Pivot Turn Normal    Step Over Obstacle Normal    Step Around Obstacles Normal    Steps Mild Impairment    Total Score 20      Neuro Re-ed    Neuro Re-ed Details  At bottom of steps: standing on airex alternating taps on bottom step x 10 then 2nd sstep x 10 with cues to try to control  movements especially when weight on RLE as tends to get off quicker. Verbal cues to prevent circumduction on right when tapping second step. Standing on airex tapping left heel off forwards then back up x 10 CGA trying to work on control on RLE. Pt at first pushing back up with left toes some but improved as went on. SLS on RLE on airex with moving soccerball forward/back 10 x 2 and left/right 10 x 2 with LLE with fingertip support.      Exercises   Exercises Other Exercises    Other Exercises  Standing right hamstring curl 10 x 2.      Knee/Hip Exercises: Aerobic   Other Aerobic SciFit x 8 min level 3 with legs only                  PT Education - 07/30/20 1148    Education Details Discussed results of DGI and FGA testing.    Person(s) Educated Patient    Methods Explanation    Comprehension Verbalized understanding            PT Short Term Goals - 07/30/20 1108      PT SHORT TERM GOAL #1   Title Pt will improve DGI score to at least a 16/24 in order to demo decr fall risk. ALL STGS DUE 08/02/20    Baseline 13/24, 20/24    Time 3    Period Weeks    Status Achieved    Target Date 08/02/20      PT SHORT TERM GOAL #2   Title Pt will decrease 5xSTS to </= 15 seconds for decreased fall risk.    Baseline 07/09/20 16.62 sec. 11.71 sec from chair without hands    Time 3    Period Weeks    Status Achieved      PT SHORT TERM GOAL #3   Title Pt will increase gait speed to >/= 0.8 m/s with single walking stick for increased access to  community and decreased fall risk.    Baseline .69 m/s with walking stick    Time 3    Period Weeks    Status Revised      PT SHORT TERM GOAL #4   Title Pt will decr TUG to 13 seconds or less with no AD in order to demo decr fall risk.    Baseline 14.19 seconds with no AD    Time 3    Period Weeks    Status New             PT Long Term Goals - 07/12/20 1027      PT LONG TERM GOAL #1   Title Pt will be independent with progressive HEP. ALL LTGS DUE 08/23/20    Baseline added exercises with ankle weights to HEP, pt will benefit from on-going additions    Time 6    Period Weeks    Status On-going    Target Date 08/23/20      PT LONG TERM GOAL #2   Title Pt will decrease 5xSTS to </= 12 seconds to no longer be classified as fall risk and to be WNL for age and gender.    Baseline 22 s from standard arm chair without UE support. 06/19/20 17.37 sec, 07/09/20 16.62 sec    Time 6    Period Weeks    Status On-going      PT LONG TERM GOAL #3   Title Pt will improve score on DGI to at least a  19/24 in order to demo decr fall risk.    Baseline 13/24    Time 6    Period Weeks    Status New      PT LONG TERM GOAL #4   Title Pt will reach .90 m/s gait speed for community ambulation and decreased fall risk with LRAD vs. no AD    Baseline 0.65 m/s. 06/19/20 0.45m/s with walker and 0.9m/s with cane, .69 m/s with walking stick    Time 6    Period Weeks    Status Revised      PT LONG TERM GOAL #5   Title Pt will ambulated 800+ feet on outdoor/unlevel surfaces with supervision and LRAD vs. no AD.    Baseline NT, 400' with supervision and walking stick (limited distance due to cold weather conditions)    Time 6    Period Weeks    Status On-going      PT LONG TERM GOAL #6   Title Pt will increase FOTO from 46 to >/= 70 for increased confidence in abilities.    Baseline 46 05/21/20    Time 6    Period Weeks    Status On-going                 Plan - 07/30/20 1150     Clinical Impression Statement PT started checking STGs today. Pt continues to show good progress meeting 5 x sit to stand goal and DGI goal. DGI score indicating lower fall risk with score of 20/24 now. PT also assessed FGA as most point loss on DGI is due to quality deficits. Pt at moderate fall risk on FGA with score of 19/30. Pt continues to show improved activity tolerance with increasing time on SciFit with no reported fatigue today.    Personal Factors and Comorbidities Comorbidity 3+;Age    Comorbidities MS, CHB s/p PPM, HOCM    Examination-Activity Limitations Squat;Locomotion Level;Transfers;Stairs;Stand    Examination-Participation Restrictions Yard Work;Occupation;Driving;Community Activity    Stability/Clinical Decision Making Evolving/Moderate complexity    Rehab Potential Good    PT Frequency 2x / week    PT Duration 6 weeks    PT Treatment/Interventions Gait training;Stair training;Functional mobility training;Therapeutic activities;Therapeutic exercise;Balance training;Neuromuscular re-education;Patient/family education;Orthotic Fit/Training;DME Instruction;ADLs/Self Care Home Management;Energy conservation;Manual techniques;Vestibular    PT Next Visit Plan Finish checking remaining STGs next visit.  Gait training without AD incorporating more dynamic gait activities with reciprocal steps/head turns.  floor <> stand transfers. corner balance exercises and on compliant surfaces, RLE strength    Consulted and Agree with Plan of Care Patient;Family member/caregiver    Family Member Consulted Wife           Patient will benefit from skilled therapeutic intervention in order to improve the following deficits and impairments:  Abnormal gait,Decreased coordination,Difficulty walking,Decreased endurance,Decreased activity tolerance,Impaired perceived functional ability,Decreased balance,Decreased mobility,Decreased strength,Improper body mechanics  Visit Diagnosis: Other abnormalities of  gait and mobility  Muscle weakness (generalized)  Hemiplegia and hemiparesis following cerebral infarction affecting right dominant side [facility])     Problem List Patient Active Problem List   Diagnosis Date Noted  . Stroke ([place]) 05/16/2020  . Acute focal neurological deficit 05/15/2020  . BPH (benign prostatic hyperplasia) 02/02/2016  . Fractured atrial pacemaker lead wire 07/18/2015  . 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 Chest pain 08/02/2013  . SOB (shortness of breath) 08/02/2013  . PVC (premature ventricular contraction) 02/13/2013  . Chest pain on exertion 02/25/2012  . Pacemaker-dual-chamber Medtronic 04/29/2011  . Change in  bowel habits 01/22/2011  . Weight loss 01/22/2011  . COUGH 09/01/2010  . Multiple sclerosis ([place])   . HOCM (hypertrophic obstructive cardiomyopathy) ([place])   . Right bundle branch block   . Complete heart block ([place]) 12/05/2007    Electa Sniff, PT, DPT, NCS 07/30/2020, 11:56 AM  [facility] [address] [place], [place], [address] Phone: [phone]   Fax:  [phone]  Name: Rodney Adkins MRN: [id number] Date of Birth: 1956/05/06

## 2020-07-30 NOTE — Therapy (Signed)
[facility] [address] [place], [place], [address] Phone: [phone]   Fax:  [phone]  Speech Language Pathology Treatment  Patient Details  Name: Rodney Adkins MRN: [id number] Date of Birth: 10-Oct-1955 Referring Provider (SLP): Darliss Cheney,  MD   Encounter Date: 07/30/2020   End of Session - 07/30/20 1518    Visit Number 11    Number of Visits 17    Date for SLP Re-Evaluation 09/10/20    SLP Start Time 0936    SLP Stop Time  1018    SLP Time Calculation (min) 42 min    Activity Tolerance Patient tolerated treatment well           Past Medical History:  Diagnosis Date  . Complete heart block [facility]) June 2009   a. Presented with hypotension/pulm edema 12/2007 when HOCM was also discovered. s/p dual chamber MDT pacemaker. Thallium stest 7/09 negative for infiltrative disease or ischemia.  Followed Dr [name]  Marland Kitchen Heart murmur   . HOCM (hypertrophic obstructive cardiomyopathy) ([place]) 2009   Discovered by ECHO when he developed CHB. Mgmt Dr Caryl Comes  and Dr [name] at [facility]. Has outflow obstruction on ECHO 2011  . Multiple sclerosis ([place]) 1998   Sxs started 1998 with L body numbness. MRI c/w MS. Started Avonex. Saw Dr [name] at [facility] and switched to Betaseron and was on for 5 yrs but developed depression and site rxns and D/C'd 2006. While on Betaseron, occassionally required solumedrol for flares.  . Pacemaker 2017  . Prostate enlargement   . Right bundle branch block   . SOB (shortness of breath)    a. PFTs 09/2010 essentially normal per pulm note (mild obst defect). b. 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  . 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 Pacer reprogrammed with improvement in sx.    Past Surgical History:  Procedure Laterality Date  . LEFT  HEART CATHETERIZATION WITH CORONARY ANGIOGRAM N/A 08/22/2013   Procedure: LEFT HEART CATHETERIZATION WITH CORONARY ANGIOGRAM;  Surgeon: [name], MD;  Location: [facility] CATH LAB;  Service: Cardiovascular;  Laterality: N/A;  . LEG TENDON SURGERY  2008   Os peroneus resection and peroneal tendon repair  . pace Agricultural engineer    . PACEMAKER INSERTION  12/2007   Complete heart block  . PACEMAKER LEAD REMOVAL Left 07/18/2015   Procedure: ATRIAL PACEMAKER LEAD EXTRACTION; ATRIAL PACEMAKER LEAD INSERTION; GENERATOR REPLACEMENT.;  Surgeon: [name], MD;  Location: [facility];  Service: Cardiovascular;  Laterality: Left;  [name] to back up  . PERIPHERAL VASCULAR CATHETERIZATION N/A 02/12/2015   Procedure: Venogram;  Surgeon: [name], MD;  Location: [facility] CV LAB;  Service: Cardiovascular;  Laterality: N/A;  . TEE WITHOUT CARDIOVERSION N/A 07/18/2015   Procedure: TRANSESOPHAGEAL ECHOCARDIOGRAM (TEE);  Surgeon: [name], MD;  Location: [facility] OR;  Service: Cardiovascular;  Laterality: N/A;    There were no vitals filed for this visit.   Subjective Assessment - 07/30/20 0945    Subjective Pt has not heard from Dr. Ammie Ferrier office about referral to Dr. Rowe Clack.    Currently in Pain? 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 SLP worked with pt to listen back to each 2 minutes of lecture to assess his success with overarticulation. Pt noted 2 instances where a word was not articulated with clarity. SLP noted one separate instance - however in all three instances SLP educated pt that context assisted SLP in comprehending  pt's message. SLP explained to pt the possibility of ENT eval, but that as more time passes with pt's voice remaining with the current quality the hypothesis strengthens for a nerve-based (vs. muscle strength- based) deficit.      Assessment / Recommendations / Plan   Plan Continue with current plan of care   consider possible reduction to x1/week     Progression Toward Goals   Progression toward goals Progressing toward goals            SLP Education - 07/30/20 1517    Education Details strength-based vs. nerve-based voice quality disorder    Person(s) Educated Patient    Methods Explanation    Comprehension Verbalized understanding            SLP Short Term Goals - 07/10/20 0010      SLP SHORT TERM GOAL #1   Title pt will undergo objective swallow assessment    Status Deferred      SLP SHORT TERM GOAL #2   Title pt will demonstrate dysarthria HEP with rare min A over 3 sessions    Period --   or 9 sessions, for all STGs   Status Partially Met      SLP SHORT TERM GOAL #3   Title pt will demonstrate voice HEP with rare min A over 3 sessions    Baseline 06-25-20, 07-02-20, 07-09-20    Status Achieved      SLP SHORT TERM GOAL #4   Title pt will engage in 8 mintues conversation maintaining gentle voice in 3 sesions, to limit possibility of habitualizing muscle tension dysphonia    Baseline 07-02-20    Time 1    Period Weeks    Status On-going      SLP SHORT TERM GOAL #5   Title pt will demonstrate speech compensations in sentence responses 80% of the time, in 3 sesssions    Baseline 07-02-20    Time 1    Period Weeks    Status On-going            SLP Long Term Goals - 07/30/20 1519      SLP LONG TERM GOAL #1   Title pt will demonstrate voice HEP with modified indpendnece in 3 sessions    Time 4    Period Weeks   or 17 sessions, for all LTGs   Status On-going      SLP LONG TERM GOAL #2   Title pt will engage in 12 mintues conversation maintaining gentle  voice 80% of the time, to limit possibility of habitualizing muscle tension dysphonia x3 sessions    Baseline 07-26-20, 07-30-20    Time 4    Period Weeks    Status Revised      SLP LONG TERM GOAL #3   Title pt will engage in 12 mintues mod complex conversation using speech strategies 80% of the time, in 3 sessions  Baseline 07-30-20    Time 4    Period Weeks    Status On-going      SLP LONG TERM GOAL #4   Title pt will demonstrate improved score on Voice-related quality of life (QOL) measure (indicating better QOL) from therapy session #1    Time 4    Period Weeks    Status On-going            Plan - 07/30/20 1518    Clinical Impression Statement Rodney "Marden Noble" continues to present today with mild flaccid dysarthria, and 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 Pt continues to work with PHOrTE exercises at home, regularly. Skilled ST is necessary to improve pt's swallowing, speech, adn voice quality. SLP suggested referral to ENT to pt's PCP.    Speech Therapy Frequency 2x / week    Duration 8 weeks   17 visits   Treatment/Interventions Aspiration precaution training;Pharyngeal strengthening exercises;Diet toleration management by SLP;Trials of upgraded texture/liquids;Cueing hierarchy;SLP instruction and feedback;Functional tasks;Oral motor exercises;Compensatory strategies;Patient/family education;Internal/external aids;Other (comment)    Potential to Achieve Goals Good           Patient will benefit from skilled therapeutic intervention in order to improve the following deficits and impairments:   Dysarthria and anarthria  Voice hoarseness    Problem List Patient Active Problem List   Diagnosis Date Noted  . Stroke ([place]) 05/16/2020  . Acute focal neurological deficit 05/15/2020  . BPH (benign prostatic hyperplasia) 02/02/2016  . Fractured atrial pacemaker lead wire 07/18/2015  . 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  Chest pain 08/02/2013  . SOB (shortness of breath) 08/02/2013  . PVC (premature ventricular contraction) 02/13/2013  . Chest pain on exertion 02/25/2012  . Pacemaker-dual-chamber Medtronic 04/29/2011  . Change in bowel habits 01/22/2011  . Weight loss 01/22/2011  . COUGH 09/01/2010  . Multiple sclerosis ([place])   . HOCM (hypertrophic obstructive cardiomyopathy) ([place])   . Right bundle branch block   . Complete heart block ([place]) 12/05/2007    [place] ,[place], [place]  07/30/2020, 3:20 PM  [facility] [address] [place], [place], [address] Phone: [phone]   Fax:  [phone]   Name: Rodney Adkins MRN: [id number] Date of Birth: Apr 11, 1956

## 2020-07-30 NOTE — Patient Instructions (Signed)
Access Code: [facility] URL: [email or web address] Date: 07/30/2020 Prepared by: Waldo Laine  Exercises Seated Shoulder Horizontal Abduction with Resistance - 1 x daily - 7 x weekly - 3 sets - 10 reps Seated Elbow Flexion with Resistance - 1 x daily - 7 x weekly - 3 sets - 10 reps Seated Shoulder Flexion with Self-Anchored Resistance - 1 x daily - 7 x weekly - 3 sets - 10 reps Seated Elbow Extension with Self-Anchored Resistance - 1 x daily - 7 x weekly - 3 sets - 10 reps

## 2020-07-30 NOTE — Therapy (Signed)
[facility] [address] [place], [place], [address] Phone: [phone]   Fax:  [phone]  Occupational Therapy Treatment & Recertification  Patient Details  Name: Rodney Adkins MRN: [id number] Date of Birth: [birth year] Referring Provider (OT): Darliss Cheney, MD   Encounter Date: 07/30/2020   OT End of Session - 07/30/20 1017    Visit Number 15    Number of Visits 24    Date for OT Re-Evaluation 08/27/20   at renewal   Authorization Type State BCBS    Authorization Time Period VL:[place] Week 1 of 4 at renewal (1/25)    OT Start Time 1023    OT Stop Time 1103    OT Time Calculation (min) 40 min    Activity Tolerance Patient tolerated treatment well    Behavior During Therapy [facility] for tasks assessed/performed           Past Medical History:  Diagnosis Date  . Complete heart block [facility]) June 2009   a. Presented with hypotension/pulm edema 12/2007 when HOCM was also discovered. s/p dual chamber MDT pacemaker. Thallium stest 7/09 negative for infiltrative disease or ischemia.  Followed Dr [name]  Marland Kitchen Heart murmur   . HOCM (hypertrophic obstructive cardiomyopathy) ([place]) 2009   Discovered by ECHO when he developed CHB. Mgmt Dr Caryl Comes  and Dr [name] at [facility]. Has outflow obstruction on ECHO 2011  . Multiple sclerosis ([place]) 1998   Sxs started 1998 with L body numbness. MRI c/w MS. Started Avonex. Saw Dr [name] at [facility] and switched to Betaseron and was on for 5 yrs but developed depression and site rxns and D/C'd 2006. While on Betaseron, occassionally required solumedrol for flares.  . Pacemaker 2017  . Prostate enlargement   . Right bundle branch block   . SOB (shortness of breath)    a. PFTs 09/2010 essentially normal per pulm note (mild obst defect). b. 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  . Wenckebach    a. Decreased ex tolerance 2010/2012 - underwent stress echo to look @ effects of MV/LVOT - nothing noted;  although pt had high rate Wenckebach behavior with associated ex intol. Pacer reprogrammed with improvement in sx.    Past Surgical History:  Procedure Laterality Date  . LEFT HEART CATHETERIZATION WITH CORONARY ANGIOGRAM N/A 08/22/2013   Procedure: LEFT HEART CATHETERIZATION WITH CORONARY ANGIOGRAM;  Surgeon: [name], MD;  Location: [facility] CATH LAB;  Service: Cardiovascular;  Laterality: N/A;  . LEG TENDON SURGERY  2008   Os peroneus resection and peroneal tendon repair  . pace Agricultural engineer    . PACEMAKER INSERTION  12/2007   Complete heart block  . PACEMAKER LEAD REMOVAL Left 07/18/2015   Procedure: ATRIAL PACEMAKER LEAD EXTRACTION; ATRIAL PACEMAKER LEAD INSERTION; GENERATOR REPLACEMENT.;  Surgeon: [name], MD;  Location: [facility];  Service: Cardiovascular;  Laterality: Left;  [name] to back up  . PERIPHERAL VASCULAR CATHETERIZATION N/A 02/12/2015   Procedure: Venogram;  Surgeon: [name], MD;  Location: [place] CV LAB;  Service: Cardiovascular;  Laterality: N/A;  . TEE WITHOUT CARDIOVERSION N/A 07/18/2015   Procedure: TRANSESOPHAGEAL ECHOCARDIOGRAM (TEE);  Surgeon: [name], MD;  Location: [facility] OR;  Service: Cardiovascular;  Laterality: N/A;    There were no vitals filed for this visit.   Subjective Assessment - 07/30/20 1023    Subjective  Pt denies any pain and denies any changes since last visit. "My hand gets tired when I use it -  starts out good and then gets tired"    Patient is accompanied by: Family member   spouse   Pertinent History Fall Risk. Speech Deficits.    Limitations MS, CHB s/p PPM, HOCM    Patient Stated Goals right hand. 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 Return to driving recommendations.    Currently in Pain? No/denies                      Addressed and updated goals for renewal period.   OT Treatments/Exercises (OP) - 07/30/20 1054      Fine Motor Coordination (Hand/Wrist)   Grooved  pegs in hand manipulation for placing pegs into board. removed with tweezers min difficuly and drops                  OT Education - 07/30/20 1102    Education Details issued red theraband exercises - see pt instructions    Person(s) Educated Patient    Methods Explanation;Demonstration;Handout    Comprehension Verbalized understanding;Returned demonstration            OT Short Term Goals - 07/09/20 1108      OT SHORT TERM GOAL #1   Title Pt will be independent with HEP 06/21/20    Time 4    Period Weeks    Status Achieved    Target Date 06/21/20      OT SHORT TERM GOAL #2   Title Pt will demonstrate improved fine motor coordination by demonstrating decrease in 9 hole peg test score by 6 seconds with RUE.    Baseline RUE 71.06    Time 4    Period Weeks    Status Achieved      OT SHORT TERM GOAL #3   Title Pt will perform simple warm meal prep or home management task with supervision and good safety awareness.    Time 4    Period Weeks    Status Achieved      OT SHORT TERM GOAL #4   Title Pt will write 2-3 sentences with 75% legibility and increased size of letters (mild micrographia).    Baseline severe micrographia    Time 4    Period Weeks    Status Achieved   3 sentences with good letter size and legibility     OT SHORT TERM GOAL #5   Title Pt will demonstrate increase in grip strength by 5 lbs in RUE for increased ease with ADLs and clothing management, etc.    Baseline RUE 45.4 (LUE 65.6)    Time 4    Period Weeks    Status Achieved   RUE 55.7 lbs     OT SHORT TERM GOAL #6   Title Pt will report using RUE (dominant hand) for 25% or more of functional tasks.    Time 4    Period Weeks    Status Achieved             OT Long Term Goals - 07/30/20 1026      OT LONG TERM GOAL #1   Title *Pt will be independent with updated HEP 08/27/20    Time 4    Period Weeks    Status On-going    Target Date 08/27/20      OT LONG TERM GOAL #2   Title *Pt  will demonstrate improved fine motor coordination by demonstrating decrease in 9 hole peg test score to 26 seconds or less  with RUE.    Baseline at renewal RUE 31.75 seconds    Time 8    Period Weeks    Status Revised      OT LONG TERM GOAL #3   Title *Pt will perform work simulated tasks (typing, using mouse, etc) with RUE as dominant hand with 90% accuracy.    Time 8    Period Weeks    Status On-going      OT LONG TERM GOAL #4   Title Pt will report completing at least 1 home management task per day with mod I in order to decrease caregiver burden.    Time 8    Period Weeks    Status Achieved      OT LONG TERM GOAL #5   Title Pt will improve grip strength by at least 10 lbs in RUE for strengthening dominant hand for functional use and tasks.    Baseline RUE 45.4 (LUE 65.6)    Time 8    Period Weeks    Status Achieved   67 RUE     OT LONG TERM GOAL #6   Title *Pt will report using RUE (dominant hand) for at least 75% of functional tasks per day.    Time 8    Period Weeks    Status Partially Met   report fatigue     OT LONG TERM GOAL #7   Title Pt will perform physical and cognitive tasks simultaneously with 95% accuracy for returning to prior tasks.    Time 8    Period Weeks    Status Achieved   100%     OT LONG TERM GOAL #8   Title *Pt will write 3-5 sentences with 90% legibility and appropriate sizing.    Time 8    Period Weeks    Status On-going   pt has made much improvement but continues to get smaller by end of sentence/paragraph                Plan - 07/30/20 1018    Clinical Impression Statement Pt is due for renewal this day. Pt has met 6/6 STGs and 5/8 LTGs at time of renewal on 07/30/2020. Revised 9 hole peg test goal to upgrade for continued progress  with fine motor coordination with RUE and continuing to address remaining goals during renewal period. Pt has made improvements with fine motor coordination, grip strength and improved legibility with  handwriting however fatigues quickly with RUE use. Skilled occupational therapy is recommended to continue working on areas of deficit.    OT Occupational Profile and History Detailed Assessment- Review of Records and additional review of physical, cognitive, psychosocial history related to current functional performance    Occupational performance deficits (Please refer to evaluation for details): IADL's;ADL's;Social Participation;Leisure;Work;Rest and Sleep    Body Structure / Function / Physical Skills ADL;Dexterity;Decreased knowledge of use of DME;Strength;UE functional use;Endurance;IADL;Coordination;FMC    Rehab Potential Good    Clinical Decision Making Limited treatment options, no task modification necessary    Comorbidities Affecting Occupational Performance: None    Modification or Assistance to Complete Evaluation  No modification of tasks or assist necessary to complete eval    OT Frequency 2x / week    OT Duration 4 weeks   or 8 visits from renewal 07/30/20   OT Treatment/Interventions Self-care/ADL training;DME and/or AE instruction;Gait Training;Therapeutic activities;Balance training;Cognitive remediation/compensation;Therapeutic exercise;Neuromuscular education;Passive range of motion;Visual/perceptual remediation/compensation;Patient/family education;Energy conservation    Plan continue working towards remaining goals  OT Home Exercise Plan coordination HEP    Consulted and Agree with Plan of Care Patient;Family member/caregiver   spouse   Family Member Consulted spouse           Patient will benefit from skilled therapeutic intervention in order to improve the following deficits and impairments:   Body Structure / Function / Physical Skills: ADL,Dexterity,Decreased knowledge of use of DME,Strength,UE functional use,Endurance,IADL,Coordination,FMC       Visit Diagnosis: Muscle weakness (generalized) - Plan: Ot plan of care cert/re-cert  Hemiplegia and hemiparesis  following cerebral infarction affecting right dominant side ([place]) - Plan: Ot plan of care cert/re-cert  Other lack of coordination - Plan: Ot plan of care cert/re-cert  Attention and concentration deficit - Plan: Ot plan of care cert/re-cert    Problem List Patient Active Problem List   Diagnosis Date Noted  . Stroke ([place]) 05/16/2020  . Acute focal neurological deficit 05/15/2020  . BPH (benign prostatic hyperplasia) 02/02/2016  . Fractured atrial pacemaker lead wire 07/18/2015  . 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 Chest pain 08/02/2013  . SOB (shortness of breath) 08/02/2013  . PVC (premature ventricular contraction) 02/13/2013  . Chest pain on exertion 02/25/2012  . Pacemaker-dual-chamber Medtronic 04/29/2011  . Change in bowel habits 01/22/2011  . Weight loss 01/22/2011  . COUGH 09/01/2010  . Multiple sclerosis ([place])   . HOCM (hypertrophic obstructive cardiomyopathy) ([place])   . Right bundle branch block   . Complete heart block ([place]) 12/05/2007    Zachery Conch MOT, OTR/L  07/30/2020, 2:27 PM  [facility] [address] [place], [place], [address] Phone: [phone]   Fax:  [phone]  Name: Rodney Adkins MRN: [id number] Date of Birth: 1955/09/28

## 2020-08-01 ENCOUNTER — Ambulatory Visit: Payer: BC Managed Care – PPO

## 2020-08-01 ENCOUNTER — Ambulatory Visit: Payer: BC Managed Care – PPO | Admitting: Occupational Therapy

## 2020-08-06 ENCOUNTER — Encounter: Payer: Self-pay | Admitting: Physical Therapy

## 2020-08-06 ENCOUNTER — Ambulatory Visit: Payer: BC Managed Care – PPO | Attending: Family Medicine

## 2020-08-06 ENCOUNTER — Ambulatory Visit: Payer: BC Managed Care – PPO | Admitting: Occupational Therapy

## 2020-08-06 ENCOUNTER — Other Ambulatory Visit: Payer: Self-pay

## 2020-08-06 ENCOUNTER — Ambulatory Visit: Payer: BC Managed Care – PPO | Admitting: Physical Therapy

## 2020-08-06 ENCOUNTER — Encounter: Payer: Self-pay | Admitting: Occupational Therapy

## 2020-08-06 DIAGNOSIS — R41840 Attention and concentration deficit: Secondary | ICD-10-CM | POA: Insufficient documentation

## 2020-08-06 DIAGNOSIS — R471 Dysarthria and anarthria: Secondary | ICD-10-CM | POA: Diagnosis present

## 2020-08-06 DIAGNOSIS — R278 Other lack of coordination: Secondary | ICD-10-CM

## 2020-08-06 DIAGNOSIS — R2689 Other abnormalities of gait and mobility: Secondary | ICD-10-CM | POA: Diagnosis present

## 2020-08-06 DIAGNOSIS — I69351 Hemiplegia and hemiparesis following cerebral infarction affecting right dominant side: Secondary | ICD-10-CM

## 2020-08-06 DIAGNOSIS — R490 Dysphonia: Secondary | ICD-10-CM | POA: Insufficient documentation

## 2020-08-06 DIAGNOSIS — M6281 Muscle weakness (generalized): Secondary | ICD-10-CM

## 2020-08-06 NOTE — Therapy (Signed)
[facility] [address] [place], [place], [address] Phone: [phone]   Fax:  [phone]  Physical Therapy Treatment  Patient Details  Name: Rodney Adkins MRN: [id number] Date of Birth: 1955-10-31 Referring Provider (PT): Otto Herb, MD   Encounter Date: 08/06/2020   PT End of Session - 08/06/20 1115    Visit Number 20    Number of Visits 27    Authorization Type BCBS PPO - FOTO patient    PT Start Time 1016    PT Stop Time 1058    PT Time Calculation (min) 42 min    Equipment Utilized During Treatment Gait belt    Activity Tolerance Patient tolerated treatment well    Behavior During Therapy WFL for tasks assessed/performed           Past Medical History:  Diagnosis Date  . Complete heart block [facility]) June 2009   a. Presented with hypotension/pulm edema 12/2007 when HOCM was also discovered. s/p dual chamber MDT pacemaker. Thallium stest 7/09 negative for infiltrative disease or ischemia.  Followed Dr [name]  Marland Kitchen Heart murmur   . HOCM (hypertrophic obstructive cardiomyopathy) ([place]) 2009   Discovered by ECHO when he developed CHB. Mgmt Dr Caryl Comes  and Dr [name] at [facility]. Has outflow obstruction on ECHO 2011  . Multiple sclerosis ([place]) 1998   Sxs started 1998 with L body numbness. MRI c/w MS. Started Avonex. Saw Dr [name] at [facility] and switched to Betaseron and was on for 5 yrs but developed depression and site rxns and D/C'd 2006. While on Betaseron, occassionally required solumedrol for flares.  . Pacemaker 2017  . Prostate enlargement   . Right bundle branch block   . SOB (shortness of breath)    a. PFTs 09/2010 essentially normal per pulm note (mild obst defect). b. 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  . 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 Pacer  reprogrammed with improvement in sx.    Past Surgical History:  Procedure Laterality Date  . LEFT HEART CATHETERIZATION WITH CORONARY ANGIOGRAM N/A 08/22/2013   Procedure: LEFT HEART CATHETERIZATION WITH CORONARY ANGIOGRAM;  Surgeon: [name], MD;  Location: [facility] CATH LAB;  Service: Cardiovascular;  Laterality: N/A;  . LEG TENDON SURGERY  2008   Os peroneus resection and peroneal tendon repair  . pace Agricultural engineer    . PACEMAKER INSERTION  12/2007   Complete heart block  . PACEMAKER LEAD REMOVAL Left 07/18/2015   Procedure: ATRIAL PACEMAKER LEAD EXTRACTION; ATRIAL PACEMAKER LEAD INSERTION; GENERATOR REPLACEMENT.;  Surgeon: [name], MD;  Location: [facility];  Service: Cardiovascular;  Laterality: Left;  [name] to back up  . PERIPHERAL VASCULAR CATHETERIZATION N/A 02/12/2015   Procedure: Venogram;  Surgeon: [name], MD;  Location: [facility] CV LAB;  Service: Cardiovascular;  Laterality: N/A;  . TEE WITHOUT CARDIOVERSION N/A 07/18/2015   Procedure: TRANSESOPHAGEAL ECHOCARDIOGRAM (TEE);  Surgeon: [name], MD;  Location: [facility] OR;  Service: Cardiovascular;  Laterality: N/A;    There were no vitals filed for this visit.   Subjective Assessment - 08/06/20 1018    Subjective No falls. Has noticed improvements in his balance, but they are very slow.    Patient is accompained by: Family member    Pertinent History MS, CHB s/p PPM, HOCM    Limitations  Walking;Writing;Standing;Lifting;House hold activities    Patient Stated Goals wants to be able to walk better.    Currently in Pain? No/denies              [facility] PT Assessment - 08/06/20 1021      Timed Up and Go Test   Normal TUG (seconds) 10.19   no AD   Cognitive TUG (seconds) 11.72                         [facility] Adult PT Treatment/Exercise - 08/06/20 1028      Ambulation/Gait   Ambulation/Gait Yes    Ambulation/Gait Assistance 5: Supervision;4: Min guard    Ambulation/Gait Assistance Details between  activities with no AD, performed 2 laps with asking pt to perform head motions R/L, quick stops, and retro gait 2 x 15'    Ambulation Distance (Feet) 230 Feet    Assistive device None    Gait Pattern Step-through pattern;Decreased hip/knee flexion - right;Decreased stance time - right;Decreased arm swing - right    Ambulation Surface Level;Indoor    Gait velocity 11.93 seconds = .83 ft/sec no AD      Exercises   Exercises Other Exercises    Other Exercises  reviewed prone knee flexion from HEP as pt has only been primarily performing in standing, performed x10 reps with RLE with cues for slowed and controlled               Balance Exercises - 08/06/20 0001      Balance Exercises: Standing   Standing Eyes Closed Wide (BOA);Narrow base of support (BOS);Foam/compliant surface    Standing Eyes Closed Limitations on blue air ex: feet hip width distance eyes closed 2 x 30 seconds, wide BOS eyes closed x5 reps head turns, x10 reps head turns with min guard for balance    Tandem Gait Forward;3 reps;Limitations    Tandem Gait Limitations down and back at countertop on red mat intermittent UE support    Sidestepping Foam/compliant support;4 reps    Sidestepping Limitations on red mat side stepping and tapping cone (4 cones total) with RLE down and back x2 reps with cues for incr hip/knee flexion, then tapping cone with LLE for RLE stance time down and back x2 reps with min guard for balance    Other Standing Exercises on blue air ex and eyes open: tandem with LLE posteriorly x5 reps head nods x5 reps head turns, modified tandem with RLE posteriorly x5 reps head turns, x5 reps head nods             PT Education - 08/06/20 1115    Education Details progress towards goals    Person(s) Educated Patient    Methods Explanation    Comprehension Verbalized understanding            PT Short Term Goals - 08/06/20 1028      PT SHORT TERM GOAL #1   Title Pt will improve DGI score to at least  a 16/24 in order to demo decr fall risk. ALL STGS DUE 08/02/20    Baseline 13/24, 20/24    Time 3    Period Weeks    Status Achieved    Target Date 08/02/20      PT SHORT TERM GOAL #2   Title Pt will decrease 5xSTS to </= 15 seconds for decreased fall risk.    Baseline 07/09/20 16.62 sec. 11.71 sec from chair without hands  Time 3    Period Weeks    Status Achieved      PT SHORT TERM GOAL #3   Title Pt will increase gait speed to >/= 0.8 m/s with single walking stick for increased access to community and decreased fall risk.    Baseline .69 m/s with walking stick, 11.93 seconds = .83 ft/sec no AD on 08/06/20    Time 3    Period Weeks    Status Achieved      PT SHORT TERM GOAL #4   Title Pt will decr TUG to 13 seconds or less with no AD in order to demo decr fall risk.    Baseline 14.19 seconds with no AD, 10.19 seconds no AD    Time 3    Period Weeks    Status Achieved             PT Long Term Goals - 07/12/20 1027      PT LONG TERM GOAL #1   Title Pt will be independent with progressive HEP. ALL LTGS DUE 08/23/20    Baseline added exercises with ankle weights to HEP, pt will benefit from on-going additions    Time 6    Period Weeks    Status On-going    Target Date 08/23/20      PT LONG TERM GOAL #2   Title Pt will decrease 5xSTS to </= 12 seconds to no longer be classified as fall risk and to be WNL for age and gender.    Baseline 22 s from standard arm chair without UE support. 06/19/20 17.37 sec, 07/09/20 16.62 sec    Time 6    Period Weeks    Status On-going      PT LONG TERM GOAL #3   Title Pt will improve score on DGI to at least a 19/24 in order to demo decr fall risk.    Baseline 13/24    Time 6    Period Weeks    Status New      PT LONG TERM GOAL #4   Title Pt will reach .90 m/s gait speed for community ambulation and decreased fall risk with LRAD vs. no AD    Baseline 0.65 m/s. 06/19/20 0.1m/s with walker and 0.58m/s with cane, .69 m/s with walking  stick    Time 6    Period Weeks    Status Revised      PT LONG TERM GOAL #5   Title Pt will ambulated 800+ feet on outdoor/unlevel surfaces with supervision and LRAD vs. no AD.    Baseline NT, 400' with supervision and walking stick (limited distance due to cold weather conditions)    Time 6    Period Weeks    Status On-going      PT LONG TERM GOAL #6   Title Pt will increase FOTO from 46 to >/= 70 for increased confidence in abilities.    Baseline 46 05/21/20    Time 6    Period Weeks    Status On-going                 Plan - 08/06/20 1121    Clinical Impression Statement Reviewed remainder of pt's STGs - with pt meeting STG #3 and #4. Pt improved gait speed to .83 ft/sec with no AD (previously was .69 m/s with walking stick). Pt improved TUG to 10.19 seconds, decr pt's risk of falls (previously 14.19 seconds). Remainder of session focused on balance on compliant surfaces - SLS,  tandem stance, and eyes closed with head motions. Pt needing close min guard for balance. Will continue to progress towards LTGs.    Personal Factors and Comorbidities Comorbidity 3+;Age    Comorbidities MS, CHB s/p PPM, HOCM    Examination-Activity Limitations Squat;Locomotion Level;Transfers;Stairs;Stand    Examination-Participation Restrictions Yard Work;Occupation;Driving;Community Activity    Stability/Clinical Decision Making Evolving/Moderate complexity    Rehab Potential Good    PT Frequency 2x / week    PT Duration 6 weeks    PT Treatment/Interventions Gait training;Stair training;Functional mobility training;Therapeutic activities;Therapeutic exercise;Balance training;Neuromuscular re-education;Patient/family education;Orthotic Fit/Training;DME Instruction;ADLs/Self Care Home Management;Energy conservation;Manual techniques;Vestibular    PT Next Visit Plan review HEP and add more challenging balance activities.    Gait training without AD incorporating more dynamic gait activities with  reciprocal steps/head turns.  floor <> stand transfers. corner balance exercises and on compliant surfaces, RLE strength    Consulted and Agree with Plan of Care Patient;Family member/caregiver    Family Member Consulted Wife           Patient will benefit from skilled therapeutic intervention in order to improve the following deficits and impairments:  Abnormal gait,Decreased coordination,Difficulty walking,Decreased endurance,Decreased activity tolerance,Impaired perceived functional ability,Decreased balance,Decreased mobility,Decreased strength,Improper body mechanics  Visit Diagnosis: Muscle weakness (generalized)  Hemiplegia and hemiparesis following cerebral infarction affecting right dominant side ([place])  Other lack of coordination  Other abnormalities of gait and mobility     Problem List Patient Active Problem List   Diagnosis Date Noted  . Stroke ([place]) 05/16/2020  . Acute focal neurological deficit 05/15/2020  . BPH (benign prostatic hyperplasia) 02/02/2016  . Fractured atrial pacemaker lead wire 07/18/2015  . 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 Chest pain 08/02/2013  . SOB (shortness of breath) 08/02/2013  . PVC (premature ventricular contraction) 02/13/2013  . Chest pain on exertion 02/25/2012  . Pacemaker-dual-chamber Medtronic 04/29/2011  . Change in bowel habits 01/22/2011  . Weight loss 01/22/2011  . COUGH 09/01/2010  . Multiple sclerosis ([place])   . HOCM (hypertrophic obstructive cardiomyopathy) ([place])   . Right bundle branch block   . Complete heart block ([place]) 12/05/2007    Arliss Journey, PT, DPT  08/06/2020, 11:21 AM  [facility] [address] [place], [place], [address] Phone: [phone]   Fax:  [phone]  Name: MELBERT MADEIRA MRN: [id number] Date of Birth: 1956-04-09

## 2020-08-06 NOTE — Patient Instructions (Signed)
   Dr. Mila [facility] [phone]

## 2020-08-06 NOTE — Therapy (Signed)
[facility] [address] [place], [place], [address] Phone: [phone]   Fax:  [phone]  Speech Language Pathology Treatment  Patient Details  Name: Rodney Adkins MRN: [id number] Date of Birth: 1955/08/24 Referring Provider (SLP): Darliss Cheney,  MD   Encounter Date: 08/06/2020   End of Session - 08/06/20 2337    Visit Number 12    Number of Visits 17    Date for SLP Re-Evaluation 09/10/20    SLP Start Time 0935    SLP Stop Time  4818    SLP Time Calculation (min) 40 min    Activity Tolerance Patient tolerated treatment well           Past Medical History:  Diagnosis Date  . Complete heart block [facility]) June 2009   a. Presented with hypotension/pulm edema 12/2007 when HOCM was also discovered. s/p dual chamber MDT pacemaker. Thallium stest 7/09 negative for infiltrative disease or ischemia.  Followed Dr [name]  Marland Kitchen Heart murmur   . HOCM (hypertrophic obstructive cardiomyopathy) ([place]) 2009   Discovered by ECHO when he developed CHB. Mgmt Dr Caryl Comes  and Dr [name] at [facility]. Has outflow obstruction on ECHO 2011  . Multiple sclerosis ([place]) 1998   Sxs started 1998 with L body numbness. MRI c/w MS. Started Avonex. Saw Dr [name] at [facility] and switched to Betaseron and was on for 5 yrs but developed depression and site rxns and D/C'd 2006. While on Betaseron, occassionally required solumedrol for flares.  . Pacemaker 2017  . Prostate enlargement   . Right bundle branch block   . SOB (shortness of breath)    a. PFTs 09/2010 essentially normal per pulm note (mild obst defect). b. 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  . 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 Pacer reprogrammed with improvement in sx.    Past Surgical History:  Procedure Laterality Date  . LEFT  HEART CATHETERIZATION WITH CORONARY ANGIOGRAM N/A 08/22/2013   Procedure: LEFT HEART CATHETERIZATION WITH CORONARY ANGIOGRAM;  Surgeon: [name], MD;  Location: [facility] CATH LAB;  Service: Cardiovascular;  Laterality: N/A;  . LEG TENDON SURGERY  2008   Os peroneus resection and peroneal tendon repair  . pace Agricultural engineer    . PACEMAKER INSERTION  12/2007   Complete heart block  . PACEMAKER LEAD REMOVAL Left 07/18/2015   Procedure: ATRIAL PACEMAKER LEAD EXTRACTION; ATRIAL PACEMAKER LEAD INSERTION; GENERATOR REPLACEMENT.;  Surgeon: [name], MD;  Location: [facility];  Service: Cardiovascular;  Laterality: Left;  [name] to back up  . PERIPHERAL VASCULAR CATHETERIZATION N/A 02/12/2015   Procedure: Venogram;  Surgeon: [name], MD;  Location: [facility] CV LAB;  Service: Cardiovascular;  Laterality: N/A;  . TEE WITHOUT CARDIOVERSION N/A 07/18/2015   Procedure: TRANSESOPHAGEAL ECHOCARDIOGRAM (TEE);  Surgeon: [name], MD;  Location: [facility] OR;  Service: Cardiovascular;  Laterality: N/A;    There were no vitals filed for this visit.   Subjective Assessment - 08/06/20 0947    Subjective Pt has not heard from Dr. Hazle Coca office to schedule.    Currently in Pain? No/denies                 ADULT SLP TREATMENT - 08/06/20 0948      General Information   Behavior/Cognition Alert;Cooperative;Pleasant mood  Treatment Provided   Treatment provided Cognitive-Linquistic      Cognitive-Linquistic Treatment   Treatment focused on Dysarthria;Voice    Skilled Treatment Pt performed PHorTE exercises with only SLP cue needed to perform pitch glides with strength but not strain/push. Pt independent with this after SLP cue and modeling. Pt then performed dysarthria HEP for overarticulation with independence. Pt was 95% intelligible in 7 minutes of walking around clinic with background noise, adn 100% intelligible in 15 minutes of conversation in quiet/mod noisy environment. Pt had another  lecture for professional at [facility] in which he told listener to tell him if she did not understand him and she never informed pt she did not understand. Pt agreed to decr ST to once/week due to progress.      Assessment / Recommendations / Plan   Plan Other (Comment)   once/week     Progression Toward Goals   Progression toward goals Progressing toward goals            SLP Education - 08/06/20 2340    Education Details why x1/week makes sense at this time    Person(s) Educated Patient    Methods Explanation    Comprehension Verbalized understanding   pt agreed           SLP Short Term Goals - 07/10/20 0010      SLP SHORT TERM GOAL #1   Title pt will undergo objective swallow assessment    Status Deferred      SLP SHORT TERM GOAL #2   Title pt will demonstrate dysarthria HEP with rare min A over 3 sessions    Period --   or 9 sessions, for all STGs   Status Partially Met      SLP SHORT TERM GOAL #3   Title pt will demonstrate voice HEP with rare min A over 3 sessions    Baseline 06-25-20, 07-02-20, 07-09-20    Status Achieved      SLP SHORT TERM GOAL #4   Title pt will engage in 8 mintues conversation maintaining gentle voice in 3 sesions, to limit possibility of habitualizing muscle tension dysphonia    Baseline 07-02-20    Time 1    Period Weeks    Status On-going      SLP SHORT TERM GOAL #5   Title pt will demonstrate speech compensations in sentence responses 80% of the time, in 3 sesssions    Baseline 07-02-20    Time 1    Period Weeks    Status On-going            SLP Long Term Goals - 08/06/20 2338      SLP LONG TERM GOAL #1   Title pt will demonstrate voice HEP with modified indpendnece in 3 sessions    Time 3    Period Weeks   or 17 sessions, for all LTGs   Status On-going      SLP LONG TERM GOAL #2   Title pt will engage in 12 mintues conversation maintaining gentle voice 80% of the time, to limit possibility of habitualizing muscle tension  dysphonia x3 sessions    Baseline 07-26-20, 07-30-20    Period Weeks    Status Achieved      SLP LONG TERM GOAL #3   Title pt will engage in 12 mintues mod complex conversation using speech strategies 80% of the time, in 3 sessions    Baseline 07-30-20, 08-06-20    Time 3    Period Weeks  Status On-going      SLP LONG TERM GOAL #4   Title pt will demonstrate improved score on Voice-related quality of life (QOL) measure (indicating better QOL) from therapy session #1    Time 3    Period Weeks    Status On-going            Plan - 08/06/20 2337    Clinical Impression Statement Willian "Marden Noble" continues to present today with improving/resolving mild flaccid dysarthria, and 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 Pt continues to work with PHOrTE exercises at home, regularly. Skilled ST is necessary to improve pt's swallowing, speech, adn voice quality. SLP suggested referral to ENT to pt's PCP.    Speech Therapy Frequency 1x /week    Duration 8 weeks   17 visits   Treatment/Interventions Aspiration precaution training;Pharyngeal strengthening exercises;Diet toleration management by SLP;Trials of upgraded texture/liquids;Cueing hierarchy;SLP instruction and feedback;Functional tasks;Oral motor exercises;Compensatory strategies;Patient/family education;Internal/external aids;Other (comment)    Potential to Achieve Goals Good           Patient will benefit from skilled therapeutic intervention in order to improve the following deficits and impairments:   Dysarthria and anarthria  Voice hoarseness    Problem List Patient Active Problem List   Diagnosis Date Noted  . Stroke ([place]) 05/16/2020  . Acute focal neurological deficit 05/15/2020  . BPH (benign prostatic hyperplasia) 02/02/2016  . Fractured atrial pacemaker lead wire 07/18/2015  . 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 Chest pain 08/02/2013  . SOB (shortness of breath) 08/02/2013   . PVC (premature ventricular contraction) 02/13/2013  . Chest pain on exertion 02/25/2012  . Pacemaker-dual-chamber Medtronic 04/29/2011  . Change in bowel habits 01/22/2011  . Weight loss 01/22/2011  . COUGH 09/01/2010  . Multiple sclerosis ([place])   . HOCM (hypertrophic obstructive cardiomyopathy) ([place])   . Right bundle branch block   . Complete heart block ([place]) 12/05/2007    [place] ,[place], [place]  08/06/2020, 11:40 PM  [facility] [address] [place], [place], [address] Phone: [phone]   Fax:  [phone]   Name: ARHAN MCMANAMON MRN: [id number] Date of Birth: 07/27/1955

## 2020-08-06 NOTE — Therapy (Signed)
[facility] [address] [place], [place], [address] Phone: [phone]   Fax:  [phone]  Occupational Therapy Treatment  Patient Details  Name: Rodney Adkins MRN: [id number] Date of Birth: 03/25/1956 Referring Provider (OT): Darliss Cheney, MD   Encounter Date: 08/06/2020   OT End of Session - 08/06/20 1148    Visit Number 16    Number of Visits 24    Date for OT Re-Evaluation 08/27/20   at renewal   Authorization Type State BCBS    Authorization Time Period VL:MN Week 2 of 4 at renewal (2/1)    OT Start Time 1147    OT Stop Time 1230    OT Time Calculation (min) 43 min    Activity Tolerance Patient tolerated treatment well    Behavior During Therapy [facility] for tasks assessed/performed           Past Medical History:  Diagnosis Date  . Complete heart block [facility]) June 2009   a. Presented with hypotension/pulm edema 12/2007 when HOCM was also discovered. s/p dual chamber MDT pacemaker. Thallium stest 7/09 negative for infiltrative disease or ischemia.  Followed Dr [name]  Marland Kitchen Heart murmur   . HOCM (hypertrophic obstructive cardiomyopathy) ([place]) 2009   Discovered by ECHO when he developed CHB. Mgmt Dr Caryl Comes  and Dr [name] at [facility]. Has outflow obstruction on ECHO 2011  . Multiple sclerosis ([place]) 1998   Sxs started 1998 with L body numbness. MRI c/w MS. Started Avonex. Saw Dr [name] at [facility] and switched to Betaseron and was on for 5 yrs but developed depression and site rxns and D/C'd 2006. While on Betaseron, occassionally required solumedrol for flares.  . Pacemaker 2017  . Prostate enlargement   . Right bundle branch block   . SOB (shortness of breath)    a. PFTs 09/2010 essentially normal per pulm note (mild obst defect). b. 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  . Wenckebach    a. Decreased ex tolerance 2010/2012 - underwent stress echo to look @ effects of MV/LVOT - nothing noted; although pt had high  rate Wenckebach behavior with associated ex intol. Pacer reprogrammed with improvement in sx.    Past Surgical History:  Procedure Laterality Date  . LEFT HEART CATHETERIZATION WITH CORONARY ANGIOGRAM N/A 08/22/2013   Procedure: LEFT HEART CATHETERIZATION WITH CORONARY ANGIOGRAM;  Surgeon: [name], MD;  Location: [facility] CATH LAB;  Service: Cardiovascular;  Laterality: N/A;  . LEG TENDON SURGERY  2008   Os peroneus resection and peroneal tendon repair  . pace Agricultural engineer    . PACEMAKER INSERTION  12/2007   Complete heart block  . PACEMAKER LEAD REMOVAL Left 07/18/2015   Procedure: ATRIAL PACEMAKER LEAD EXTRACTION; ATRIAL PACEMAKER LEAD INSERTION; GENERATOR REPLACEMENT.;  Surgeon: [name], MD;  Location: [facility];  Service: Cardiovascular;  Laterality: Left;  [name] to back up  . PERIPHERAL VASCULAR CATHETERIZATION N/A 02/12/2015   Procedure: Venogram;  Surgeon: [name], MD;  Location: [facility] CV LAB;  Service: Cardiovascular;  Laterality: N/A;  . TEE WITHOUT CARDIOVERSION N/A 07/18/2015   Procedure: TRANSESOPHAGEAL ECHOCARDIOGRAM (TEE);  Surgeon: [name], MD;  Location: [facility] OR;  Service: Cardiovascular;  Laterality: N/A;    There were no vitals filed for this visit.   Subjective Assessment - 08/06/20 1149    Subjective  Pt denies any pain or changes.  "I don't remember the weekend"    Patient is accompanied by: Family member  Pertinent History Fall Risk. Speech Deficits.    Limitations MS, CHB s/p PPM, HOCM    Patient Stated Goals right hand. 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 Return to driving recommendations.    Currently in Pain? No/denies                        OT Treatments/Exercises (OP) - 08/06/20 1152      Exercises   Exercises Work Hardening      Work Hardening Exercises   UBE (Upper Arm Bike) 6 minutes on level 2 for conditioning      Functional Reaching Activities   High Level overall  shoulder conditioning with RUE reaching to antenna with 1-8# resistance clothespins      Fine Motor Coordination (Hand/Wrist)   Fine Motor Coordination Manipulation of small objects   mouse accuracy 56% 23/41 targets in 30 seconds. 79% click accuracy   Manipulation of small objects balling up small paper  balls in fingertips of RUE    Handwriting tracing around different shapes - pt completed with RUE (dominant) with approx 80% accuracy and 1/8" deviation from lines at times and some shaky lines                    OT Short Term Goals - 07/09/20 1108      OT SHORT TERM GOAL #1   Title Pt will be independent with HEP 06/21/20    Time 4    Period Weeks    Status Achieved    Target Date 06/21/20      OT SHORT TERM GOAL #2   Title Pt will demonstrate improved fine motor coordination by demonstrating decrease in 9 hole peg test score by 6 seconds with RUE.    Baseline RUE 71.06    Time 4    Period Weeks    Status Achieved      OT SHORT TERM GOAL #3   Title Pt will perform simple warm meal prep or home management task with supervision and good safety awareness.    Time 4    Period Weeks    Status Achieved      OT SHORT TERM GOAL #4   Title Pt will write 2-3 sentences with 75% legibility and increased size of letters (mild micrographia).    Baseline severe micrographia    Time 4    Period Weeks    Status Achieved   3 sentences with good letter size and legibility     OT SHORT TERM GOAL #5   Title Pt will demonstrate increase in grip strength by 5 lbs in RUE for increased ease with ADLs and clothing management, etc.    Baseline RUE 45.4 (LUE 65.6)    Time 4    Period Weeks    Status Achieved   RUE 55.7 lbs     OT SHORT TERM GOAL #6   Title Pt will report using RUE (dominant hand) for 25% or more of functional tasks.    Time 4    Period Weeks    Status Achieved             OT Long Term Goals - 08/06/20 1200      OT LONG TERM GOAL #1   Title *Pt will be  independent with updated HEP 08/27/20    Time 4    Period Weeks    Status On-going      OT LONG TERM GOAL #  2   Title *Pt will demonstrate improved fine motor coordination by demonstrating decrease in 9 hole peg test score to 26 seconds or less with RUE.    Baseline at renewal RUE 31.75 seconds    Time 8    Period Weeks    Status Revised      OT LONG TERM GOAL #3   Title *Pt will perform work simulated tasks (typing, using mouse, etc) with RUE as dominant hand with 90% accuracy.    Time 8    Period Weeks    Status On-going      OT LONG TERM GOAL #4   Title Pt will report completing at least 1 home management task per day with mod I in order to decrease caregiver burden.    Time 8    Period Weeks    Status Achieved      OT LONG TERM GOAL #5   Title Pt will improve grip strength by at least 10 lbs in RUE for strengthening dominant hand for functional use and tasks.    Baseline RUE 45.4 (LUE 65.6)    Time 8    Period Weeks    Status Achieved   67 RUE     OT LONG TERM GOAL #6   Title *Pt will report using RUE (dominant hand) for at least 75% of functional tasks per day.    Time 8    Period Weeks    Status Partially Met   report fatigue     OT LONG TERM GOAL #7   Title Pt will perform physical and cognitive tasks simultaneously with 95% accuracy for returning to prior tasks.    Time 8    Period Weeks    Status Achieved   100%     OT LONG TERM GOAL #8   Title *Pt will write 3-5 sentences with 90% legibility and appropriate sizing.    Time 8    Period Weeks    Status On-going   pt has made much improvement but continues to get smaller by end of sentence/paragraph                Plan - 08/06/20 1159    Clinical Impression Statement Pt continues to work towards goals. Pt actively particpating in therapy sessions and motivated for improved fine motor cooridnation and strength    OT Occupational Profile and History Detailed Assessment- Review of Records and additional  review of physical, cognitive, psychosocial history related to current functional performance    Occupational performance deficits (Please refer to evaluation for details): IADL's;ADL's;Social Participation;Leisure;Work;Rest and Sleep    Body Structure / Function / Physical Skills ADL;Dexterity;Decreased knowledge of use of DME;Strength;UE functional use;Endurance;IADL;Coordination;FMC    Rehab Potential Good    Clinical Decision Making Limited treatment options, no task modification necessary    Comorbidities Affecting Occupational Performance: None    Modification or Assistance to Complete Evaluation  No modification of tasks or assist necessary to complete eval    OT Frequency 2x / week    OT Duration 4 weeks   or 8 visits from renewal 07/30/20   OT Treatment/Interventions Self-care/ADL training;DME and/or AE instruction;Gait Training;Therapeutic activities;Balance training;Cognitive remediation/compensation;Therapeutic exercise;Neuromuscular education;Passive range of motion;Visual/perceptual remediation/compensation;Patient/family education;Energy conservation    Plan continue working towards remaining goals    OT Home Exercise Plan coordination HEP    Consulted and Agree with Plan of Care Patient;Family member/caregiver   spouse   Family Member Consulted spouse  Patient will benefit from skilled therapeutic intervention in order to improve the following deficits and impairments:   Body Structure / Function / Physical Skills: ADL,Dexterity,Decreased knowledge of use of DME,Strength,UE functional use,Endurance,IADL,Coordination,FMC       Visit Diagnosis: Muscle weakness (generalized)  Hemiplegia and hemiparesis following cerebral infarction affecting right dominant side (HCC)  Other lack of coordination  Attention and concentration deficit    Problem List Patient Active Problem List   Diagnosis Date Noted  . Stroke ([place]) 05/16/2020  . Acute focal neurological  deficit 05/15/2020  . BPH (benign prostatic hyperplasia) 02/02/2016  . Fractured atrial pacemaker lead wire 07/18/2015  . 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 Chest pain 08/02/2013  . SOB (shortness of breath) 08/02/2013  . PVC (premature ventricular contraction) 02/13/2013  . Chest pain on exertion 02/25/2012  . Pacemaker-dual-chamber Medtronic 04/29/2011  . Change in bowel habits 01/22/2011  . Weight loss 01/22/2011  . COUGH 09/01/2010  . Multiple sclerosis ([place])   . HOCM (hypertrophic obstructive cardiomyopathy) ([place])   . Right bundle branch block   . Complete heart block ([place]) 12/05/2007    Zachery Conch MOT, OTR/L  08/06/2020, 12:29 PM  [facility] [address] [place], [place], [address] Phone: [phone]   Fax:  [phone]  Name: Rodney Adkins MRN: [id number] Date of Birth: 1956/02/15

## 2020-08-09 ENCOUNTER — Encounter: Payer: Self-pay | Admitting: Occupational Therapy

## 2020-08-09 ENCOUNTER — Ambulatory Visit: Payer: BC Managed Care – PPO | Admitting: Occupational Therapy

## 2020-08-09 ENCOUNTER — Encounter: Payer: Self-pay | Admitting: Physical Therapy

## 2020-08-09 ENCOUNTER — Ambulatory Visit: Payer: BC Managed Care – PPO

## 2020-08-09 ENCOUNTER — Ambulatory Visit: Payer: BC Managed Care – PPO | Admitting: Physical Therapy

## 2020-08-09 ENCOUNTER — Other Ambulatory Visit: Payer: Self-pay

## 2020-08-09 DIAGNOSIS — I69351 Hemiplegia and hemiparesis following cerebral infarction affecting right dominant side: Secondary | ICD-10-CM

## 2020-08-09 DIAGNOSIS — R278 Other lack of coordination: Secondary | ICD-10-CM

## 2020-08-09 DIAGNOSIS — R2689 Other abnormalities of gait and mobility: Secondary | ICD-10-CM

## 2020-08-09 DIAGNOSIS — M6281 Muscle weakness (generalized): Secondary | ICD-10-CM

## 2020-08-09 DIAGNOSIS — R471 Dysarthria and anarthria: Secondary | ICD-10-CM | POA: Diagnosis not present

## 2020-08-09 DIAGNOSIS — R41840 Attention and concentration deficit: Secondary | ICD-10-CM

## 2020-08-09 NOTE — Therapy (Signed)
[facility] [address] [place], [place], [address] Phone: [phone]   Fax:  [phone]  Physical Therapy Treatment  Patient Details  Name: Rodney Adkins MRN: [id number] Date of Birth: 12-22-1955 Referring Provider (PT): Otto Herb, MD   Encounter Date: 08/09/2020   PT End of Session - 08/09/20 1214    Visit Number 21    Number of Visits 27    Authorization Type BCBS PPO - FOTO patient    PT Start Time 0846    PT Stop Time 0928    PT Time Calculation (min) 42 min    Equipment Utilized During Treatment Gait belt    Activity Tolerance Patient tolerated treatment well    Behavior During Therapy [facility] for tasks assessed/performed           Past Medical History:  Diagnosis Date  . Complete heart block [facility]) June 2009   a. Presented with hypotension/pulm edema 12/2007 when HOCM was also discovered. s/p dual chamber MDT pacemaker. Thallium stest 7/09 negative for infiltrative disease or ischemia.  Followed Dr [name]  Marland Kitchen Heart murmur   . HOCM (hypertrophic obstructive cardiomyopathy) ([place]) 2009   Discovered by ECHO when he developed CHB. Mgmt Dr Caryl Comes  and Dr [name] at [facility]. Has outflow obstruction on ECHO 2011  . Multiple sclerosis ([place]) 1998   Sxs started 1998 with L body numbness. MRI c/w MS. Started Avonex. Saw Dr [name] at [facility] and switched to Betaseron and was on for 5 yrs but developed depression and site rxns and D/C'd 2006. While on Betaseron, occassionally required solumedrol for flares.  . Pacemaker 2017  . Prostate enlargement   . Right bundle branch block   . SOB (shortness of breath)    a. PFTs 09/2010 essentially normal per pulm note (mild obst defect). b. 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  . 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 Pacer  reprogrammed with improvement in sx.    Past Surgical History:  Procedure Laterality Date  . LEFT HEART CATHETERIZATION WITH CORONARY ANGIOGRAM N/A 08/22/2013   Procedure: LEFT HEART CATHETERIZATION WITH CORONARY ANGIOGRAM;  Surgeon: [name], MD;  Location: [facility] CATH LAB;  Service: Cardiovascular;  Laterality: N/A;  . LEG TENDON SURGERY  2008   Os peroneus resection and peroneal tendon repair  . pace Agricultural engineer    . PACEMAKER INSERTION  12/2007   Complete heart block  . PACEMAKER LEAD REMOVAL Left 07/18/2015   Procedure: ATRIAL PACEMAKER LEAD EXTRACTION; ATRIAL PACEMAKER LEAD INSERTION; GENERATOR REPLACEMENT.;  Surgeon: [name], MD;  Location: [facility];  Service: Cardiovascular;  Laterality: Left;  [name] to back up  . PERIPHERAL VASCULAR CATHETERIZATION N/A 02/12/2015   Procedure: Venogram;  Surgeon: [name], MD;  Location: [facility] CV LAB;  Service: Cardiovascular;  Laterality: N/A;  . TEE WITHOUT CARDIOVERSION N/A 07/18/2015   Procedure: TRANSESOPHAGEAL ECHOCARDIOGRAM (TEE);  Surgeon: [name], MD;  Location: [facility] OR;  Service: Cardiovascular;  Laterality: N/A;    There were no vitals filed for this visit.   Subjective Assessment - 08/09/20 0849    Subjective No falls. No changes.    Patient is accompained by: Family member    Pertinent History MS, CHB s/p PPM, HOCM    Limitations Walking;Writing;Standing;Lifting;House hold activities    Patient Stated Goals  wants to be able to walk better.    Currently in Pain? No/denies                      Access Code: [email or web address] URL: [email or web address] Date: 08/09/2020 Prepared by: Janann August  Reviewed exercises below for HEP and revised/modified as appropriate;   Exercises Mini Squat with Counter Support - 2 x daily - 5 x weekly - 2 sets - 10 reps - cues for proper technique and holding for a couple of seconds, as pt initially performing how he does at home by performing a very deep  squat with incr B knee flexion.   Standing Marching - 2 x daily - 5 x weekly - 3 sets - down and back at the countertop, cues for slowed and controlled   Wide Stance with Eyes Closed on Foam Pad - 1 x daily - 5 x weekly - 2 sets - 10 reps - with feet more apart x10 reps head turns, x10 reps head nods   Alternating tapping cup at counter - 1 x daily - 5 x weekly - 2 sets - 10 reps Standing Knee Flexion AROM with Chair Support - 2 x daily - 5 x weekly - 1 sets - 10 reps Prone Knee Flexion - 1 x daily - 5 x weekly - 2 sets - 10 reps  Standing Balance with Eyes Closed on Foam - 1 x daily - 5 x weekly - 3 sets - 30 hold - with a small amount of space between feet   Standing Romberg to 3/4 Tandem Stance - 1 x daily - 5 x weekly - 3 sets - 30 hold - standing on pillow, eyes open with RLE posteriorly          [facility] Adult PT Treatment/Exercise - 08/09/20 0001      Ambulation/Gait   Ambulation/Gait Yes    Ambulation/Gait Assistance 5: Supervision    Ambulation/Gait Assistance Details between activities with no AD    Assistive device None    Gait Pattern Step-through pattern;Decreased hip/knee flexion - right;Decreased stance time - right;Decreased arm swing - right    Ambulation Surface Level;Indoor      Knee/Hip Exercises: Aerobic   Other Aerobic SciFit x 5 min level 3 with legs only for strengthening, activity tolerance               Balance Exercises - 08/09/20 0001      Balance Exercises: Standing   SLS with Vectors Intermittent upper extremity assist;Foam/compliant surface    SLS with Vectors Limitations standing on rockerboard in A/P direction: x10 reps RLE foot taps to single cone for incr hip/knee flexion with single UE support, then repeated wit LLE taps for SLS time on RLE. performed an additional x5 reps with no UE support with min guard    Stepping Strategy Anterior    Stepping Strategy Limitations standing on rockerboard beginning with anterior stepping x10 reps B with  cues for weight shift and incr RLE foot clearance, needing single UE support             PT Education - 08/09/20 1214    Education Details reviewed/modified pt's HEP    Person(s) Educated Patient    Methods Explanation;Demonstration;Handout    Comprehension Verbalized understanding;Returned demonstration;Verbal cues required            PT Short Term Goals - 08/06/20 1028      PT SHORT TERM GOAL #1   Title Pt will improve  DGI score to at least a 16/24 in order to demo decr fall risk. ALL STGS DUE 08/02/20    Baseline 13/24, 20/24    Time 3    Period Weeks    Status Achieved    Target Date 08/02/20      PT SHORT TERM GOAL #2   Title Pt will decrease 5xSTS to </= 15 seconds for decreased fall risk.    Baseline 07/09/20 16.62 sec. 11.71 sec from chair without hands    Time 3    Period Weeks    Status Achieved      PT SHORT TERM GOAL #3   Title Pt will increase gait speed to >/= 0.8 m/s with single walking stick for increased access to community and decreased fall risk.    Baseline .69 m/s with walking stick, 11.93 seconds = .83 ft/sec no AD on 08/06/20    Time 3    Period Weeks    Status Achieved      PT SHORT TERM GOAL #4   Title Pt will decr TUG to 13 seconds or less with no AD in order to demo decr fall risk.    Baseline 14.19 seconds with no AD, 10.19 seconds no AD    Time 3    Period Weeks    Status Achieved             PT Long Term Goals - 07/12/20 1027      PT LONG TERM GOAL #1   Title Pt will be independent with progressive HEP. ALL LTGS DUE 08/23/20    Baseline added exercises with ankle weights to HEP, pt will benefit from on-going additions    Time 6    Period Weeks    Status On-going    Target Date 08/23/20      PT LONG TERM GOAL #2   Title Pt will decrease 5xSTS to </= 12 seconds to no longer be classified as fall risk and to be WNL for age and gender.    Baseline 22 s from standard arm chair without UE support. 06/19/20 17.37 sec, 07/09/20 16.62 sec     Time 6    Period Weeks    Status On-going      PT LONG TERM GOAL #3   Title Pt will improve score on DGI to at least a 19/24 in order to demo decr fall risk.    Baseline 13/24    Time 6    Period Weeks    Status New      PT LONG TERM GOAL #4   Title Pt will reach .90 m/s gait speed for community ambulation and decreased fall risk with LRAD vs. no AD    Baseline 0.65 m/s. 06/19/20 0.3m/s with walker and 0.62m/s with cane, .69 m/s with walking stick    Time 6    Period Weeks    Status Revised      PT LONG TERM GOAL #5   Title Pt will ambulated 800+ feet on outdoor/unlevel surfaces with supervision and LRAD vs. no AD.    Baseline NT, 400' with supervision and walking stick (limited distance due to cold weather conditions)    Time 6    Period Weeks    Status On-going      PT LONG TERM GOAL #6   Title Pt will increase FOTO from 46 to >/= 70 for increased confidence in abilities.    Baseline 46 05/21/20    Time 6    Period Weeks  Status On-going                 Plan - 08/09/20 1408    Clinical Impression Statement Reviewed pt's prior HEP for balance and made upgrades to corner balance to include a more narrow BOS with eyes closed and on compliant surfaces for head motions/modified tandem stance. also added in walking marching at countertop vs. static for more dynamic SLS and balance. Remainder of session focused on balance on rockerboard with SLS and stepping strategies with incr hip/knee flexion on RLE. Pt tolerated session well - will continue to progress towards LTGs.    Personal Factors and Comorbidities Comorbidity 3+;Age    Comorbidities MS, CHB s/p PPM, HOCM    Examination-Activity Limitations Squat;Locomotion Level;Transfers;Stairs;Stand    Examination-Participation Restrictions Yard Work;Occupation;Driving;Community Activity    Stability/Clinical Decision Making Evolving/Moderate complexity    Rehab Potential Good    PT Frequency 2x / week    PT Duration 6  weeks    PT Treatment/Interventions Gait training;Stair training;Functional mobility training;Therapeutic activities;Therapeutic exercise;Balance training;Neuromuscular re-education;Patient/family education;Orthotic Fit/Training;DME Instruction;ADLs/Self Care Home Management;Energy conservation;Manual techniques;Vestibular    PT Next Visit Plan how were new additions? Gait training without AD incorporating more dynamic gait activities with reciprocal steps/head turns.  floor <> stand transfers. corner balance exercises and on compliant surfaces, RLE strength    Consulted and Agree with Plan of Care Patient;Family member/caregiver    Family Member Consulted Wife           Patient will benefit from skilled therapeutic intervention in order to improve the following deficits and impairments:  Abnormal gait,Decreased coordination,Difficulty walking,Decreased endurance,Decreased activity tolerance,Impaired perceived functional ability,Decreased balance,Decreased mobility,Decreased strength,Improper body mechanics  Visit Diagnosis: Muscle weakness (generalized)  Other lack of coordination  Other abnormalities of gait and mobility  Hemiplegia and hemiparesis following cerebral infarction affecting right dominant side [facility])     Problem List Patient Active Problem List   Diagnosis Date Noted  . Stroke ([place]) 05/16/2020  . Acute focal neurological deficit 05/15/2020  . BPH (benign prostatic hyperplasia) 02/02/2016  . Fractured atrial pacemaker lead wire 07/18/2015  . 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 Chest pain 08/02/2013  . SOB (shortness of breath) 08/02/2013  . PVC (premature ventricular contraction) 02/13/2013  . Chest pain on exertion 02/25/2012  . Pacemaker-dual-chamber Medtronic 04/29/2011  . Change in bowel habits 01/22/2011  . Weight loss 01/22/2011  . COUGH 09/01/2010  . Multiple sclerosis ([place])   . HOCM (hypertrophic obstructive cardiomyopathy) ([place])   . Right bundle  branch block   . Complete heart block ([place]) 12/05/2007    Arliss Journey, PT, DPT  08/09/2020, 2:10 PM  [facility] [address] [place], [place], [address] Phone: [phone]   Fax:  [phone]  Name: Rodney Adkins MRN: [id number] Date of Birth: 04-16-1956

## 2020-08-09 NOTE — Therapy (Signed)
[facility] [address] [place], [place], [address] Phone: [phone]   Fax:  [phone]  Occupational Therapy Treatment  Patient Details  Name: Rodney Adkins MRN: [id number] Date of Birth: [birth year] Referring Provider (OT): Darliss Cheney, MD   Encounter Date: 08/09/2020   OT End of Session - 08/09/20 1019    Visit Number 17    Number of Visits 24    Date for OT Re-Evaluation 08/27/20   at renewal   Authorization Type State BCBS    Authorization Time Period VL:[place] Week 2 of 4 at renewal (2/1)    OT Start Time 1018    OT Stop Time 1105    OT Time Calculation (min) 47 min    Activity Tolerance Patient tolerated treatment well    Behavior During Therapy [facility] for tasks assessed/performed           Past Medical History:  Diagnosis Date  . Complete heart block [facility]) June 2009   a. Presented with hypotension/pulm edema 12/2007 when HOCM was also discovered. s/p dual chamber MDT pacemaker. Thallium stest 7/09 negative for infiltrative disease or ischemia.  Followed Dr [name]  Marland Kitchen Heart murmur   . HOCM (hypertrophic obstructive cardiomyopathy) ([place]) 2009   Discovered by ECHO when he developed CHB. Mgmt Dr Caryl Comes  and Dr [name] at [facility]. Has outflow obstruction on ECHO 2011  . Multiple sclerosis ([place]) 1998   Sxs started 1998 with L body numbness. MRI c/w MS. Started Avonex. Saw Dr [name] at [facility] and switched to Betaseron and was on for 5 yrs but developed depression and site rxns and D/C'd 2006. While on Betaseron, occassionally required solumedrol for flares.  . Pacemaker 2017  . Prostate enlargement   . Right bundle branch block   . SOB (shortness of breath)    a. PFTs 09/2010 essentially normal per pulm note (mild obst defect). b. 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  . Wenckebach    a. Decreased ex tolerance 2010/2012 - underwent stress echo to look @ effects of MV/LVOT - nothing noted; although pt had high  rate Wenckebach behavior with associated ex intol. Pacer reprogrammed with improvement in sx.    Past Surgical History:  Procedure Laterality Date  . LEFT HEART CATHETERIZATION WITH CORONARY ANGIOGRAM N/A 08/22/2013   Procedure: LEFT HEART CATHETERIZATION WITH CORONARY ANGIOGRAM;  Surgeon: [name], MD;  Location: [facility] CATH LAB;  Service: Cardiovascular;  Laterality: N/A;  . LEG TENDON SURGERY  2008   Os peroneus resection and peroneal tendon repair  . pace Agricultural engineer    . PACEMAKER INSERTION  12/2007   Complete heart block  . PACEMAKER LEAD REMOVAL Left 07/18/2015   Procedure: ATRIAL PACEMAKER LEAD EXTRACTION; ATRIAL PACEMAKER LEAD INSERTION; GENERATOR REPLACEMENT.;  Surgeon: [name], MD;  Location: [place];  Service: Cardiovascular;  Laterality: Left;  [name] to back up  . PERIPHERAL VASCULAR CATHETERIZATION N/A 02/12/2015   Procedure: Venogram;  Surgeon: [name], MD;  Location: [place] CV LAB;  Service: Cardiovascular;  Laterality: N/A;  . TEE WITHOUT CARDIOVERSION N/A 07/18/2015   Procedure: TRANSESOPHAGEAL ECHOCARDIOGRAM (TEE);  Surgeon: [name], MD;  Location: [facility] OR;  Service: Cardiovascular;  Laterality: N/A;    There were no vitals filed for this visit.   Subjective Assessment - 08/09/20 1020    Subjective  My hands are a little swollen    Patient is accompanied by: Family member    Pertinent History Fall Risk.  Speech Deficits.    Limitations MS, CHB s/p PPM, HOCM    Patient Stated Goals right hand. 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 Return to driving recommendations.    Currently in Pain? No/denies                        OT Treatments/Exercises (OP) - 08/09/20 1039      Cognitive Exercises   Basic Math basic everyday math on constant therapy with 80% accuracy and 79.23s reaction time. worked on finger isolation with pressing calculator buttons and buttons on ipad - mod mistakes. Pt with  mistakes d/t trying to do math equations on paper instead of with use of calculator.      Visual/Perceptual Exercises   Word Finding word search large print - novel task for pt and requied initial cues for completing task but completed with min difficulty and increased time.      Fine Motor Coordination (Hand/Wrist)   Fine Motor Coordination Handwriting    Handwriting focused on big print with writing words from word search with good letter size (large on purpose) and legibility.                  ALL STGS MET  OT Short Term Goals - 07/09/20 1108      OT SHORT TERM GOAL #1   Title Pt will be independent with HEP 06/21/20    Time 4    Period Weeks    Status Achieved    Target Date 06/21/20      OT SHORT TERM GOAL #2   Title Pt will demonstrate improved fine motor coordination by demonstrating decrease in 9 hole peg test score by 6 seconds with RUE.    Baseline RUE 71.06    Time 4    Period Weeks    Status Achieved      OT SHORT TERM GOAL #3   Title Pt will perform simple warm meal prep or home management task with supervision and good safety awareness.    Time 4    Period Weeks    Status Achieved      OT SHORT TERM GOAL #4   Title Pt will write 2-3 sentences with 75% legibility and increased size of letters (mild micrographia).    Baseline severe micrographia    Time 4    Period Weeks    Status Achieved   3 sentences with good letter size and legibility     OT SHORT TERM GOAL #5   Title Pt will demonstrate increase in grip strength by 5 lbs in RUE for increased ease with ADLs and clothing management, etc.    Baseline RUE 45.4 (LUE 65.6)    Time 4    Period Weeks    Status Achieved   RUE 55.7 lbs     OT SHORT TERM GOAL #6   Title Pt will report using RUE (dominant hand) for 25% or more of functional tasks.    Time 4    Period Weeks    Status Achieved             OT Long Term Goals - 08/06/20 1200      OT LONG TERM GOAL #1   Title *Pt will be  independent with updated HEP 08/27/20    Time 4    Period Weeks    Status On-going      OT LONG TERM GOAL #2   Title *Pt  will demonstrate improved fine motor coordination by demonstrating decrease in 9 hole peg test score to 26 seconds or less with RUE.    Baseline at renewal RUE 31.75 seconds    Time 8    Period Weeks    Status Revised      OT LONG TERM GOAL #3   Title *Pt will perform work simulated tasks (typing, using mouse, etc) with RUE as dominant hand with 90% accuracy.    Time 8    Period Weeks    Status On-going      OT LONG TERM GOAL #4   Title Pt will report completing at least 1 home management task per day with mod I in order to decrease caregiver burden.    Time 8    Period Weeks    Status Achieved      OT LONG TERM GOAL #5   Title Pt will improve grip strength by at least 10 lbs in RUE for strengthening dominant hand for functional use and tasks.    Baseline RUE 45.4 (LUE 65.6)    Time 8    Period Weeks    Status Achieved   67 RUE     OT LONG TERM GOAL #6   Title *Pt will report using RUE (dominant hand) for at least 75% of functional tasks per day.    Time 8    Period Weeks    Status Partially Met   report fatigue     OT LONG TERM GOAL #7   Title Pt will perform physical and cognitive tasks simultaneously with 95% accuracy for returning to prior tasks.    Time 8    Period Weeks    Status Achieved   100%     OT LONG TERM GOAL #8   Title *Pt will write 3-5 sentences with 90% legibility and appropriate sizing.    Time 8    Period Weeks    Status On-going   pt has made much improvement but continues to get smaller by end of sentence/paragraph                Plan - 08/09/20 1041    Clinical Impression Statement Pt continues to work towards goals. Pt actively particpating in therapy sessions and motivated for improved fine motor cooridnation and strength    OT Occupational Profile and History Detailed Assessment- Review of Records and additional  review of physical, cognitive, psychosocial history related to current functional performance    Occupational performance deficits (Please refer to evaluation for details): IADL's;ADL's;Social Participation;Leisure;Work;Rest and Sleep    Body Structure / Function / Physical Skills ADL;Dexterity;Decreased knowledge of use of DME;Strength;UE functional use;Endurance;IADL;Coordination;FMC    Rehab Potential Good    Clinical Decision Making Limited treatment options, no task modification necessary    Comorbidities Affecting Occupational Performance: None    Modification or Assistance to Complete Evaluation  No modification of tasks or assist necessary to complete eval    OT Frequency 2x / week    OT Duration 4 weeks   or 8 visits from renewal 07/30/20   OT Treatment/Interventions Self-care/ADL training;DME and/or AE instruction;Gait Training;Therapeutic activities;Balance training;Cognitive remediation/compensation;Therapeutic exercise;Neuromuscular education;Passive range of motion;Visual/perceptual remediation/compensation;Patient/family education;Energy conservation    Plan mouse accuracy    OT Home Exercise Plan coordination HEP    Consulted and Agree with Plan of Care Patient;Family member/caregiver   spouse   Family Member Consulted spouse           Patient will benefit from skilled therapeutic  intervention in order to improve the following deficits and impairments:   Body Structure / Function / Physical Skills: ADL,Dexterity,Decreased knowledge of use of DME,Strength,UE functional use,Endurance,IADL,Coordination,FMC       Visit Diagnosis: Muscle weakness (generalized)  Hemiplegia and hemiparesis following cerebral infarction affecting right dominant side (HCC)  Other lack of coordination  Other abnormalities of gait and mobility  Attention and concentration deficit    Problem List Patient Active Problem List   Diagnosis Date Noted  . Stroke ([place]) 05/16/2020  . Acute focal  neurological deficit 05/15/2020  . BPH (benign prostatic hyperplasia) 02/02/2016  . Fractured atrial pacemaker lead wire 07/18/2015  . 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 Chest pain 08/02/2013  . SOB (shortness of breath) 08/02/2013  . PVC (premature ventricular contraction) 02/13/2013  . Chest pain on exertion 02/25/2012  . Pacemaker-dual-chamber Medtronic 04/29/2011  . Change in bowel habits 01/22/2011  . Weight loss 01/22/2011  . COUGH 09/01/2010  . Multiple sclerosis ([place])   . HOCM (hypertrophic obstructive cardiomyopathy) ([place])   . Right bundle branch block   . Complete heart block ([place]) 12/05/2007    Zachery Conch MOT, OTR/L  08/09/2020, 12:07 PM  [facility] [address] [place], [place], [address] Phone: [phone]   Fax:  [phone]  Name: Rodney Adkins MRN: [id number] Date of Birth: [birth year]

## 2020-08-13 ENCOUNTER — Ambulatory Visit: Payer: BC Managed Care – PPO | Admitting: Occupational Therapy

## 2020-08-13 ENCOUNTER — Other Ambulatory Visit: Payer: Self-pay

## 2020-08-13 ENCOUNTER — Encounter: Payer: Self-pay | Admitting: Occupational Therapy

## 2020-08-13 ENCOUNTER — Ambulatory Visit: Payer: BC Managed Care – PPO | Admitting: Physical Therapy

## 2020-08-13 ENCOUNTER — Encounter: Payer: Self-pay | Admitting: Physical Therapy

## 2020-08-13 DIAGNOSIS — R2689 Other abnormalities of gait and mobility: Secondary | ICD-10-CM

## 2020-08-13 DIAGNOSIS — M6281 Muscle weakness (generalized): Secondary | ICD-10-CM

## 2020-08-13 DIAGNOSIS — I69351 Hemiplegia and hemiparesis following cerebral infarction affecting right dominant side: Secondary | ICD-10-CM

## 2020-08-13 DIAGNOSIS — R471 Dysarthria and anarthria: Secondary | ICD-10-CM | POA: Diagnosis not present

## 2020-08-13 DIAGNOSIS — R278 Other lack of coordination: Secondary | ICD-10-CM

## 2020-08-13 DIAGNOSIS — R41840 Attention and concentration deficit: Secondary | ICD-10-CM

## 2020-08-13 NOTE — Therapy (Signed)
[facility] [address] [place], [place], [address] Phone: [phone]   Fax:  [phone]  Occupational Therapy Treatment  Patient Details  Name: Rodney Adkins MRN: [id number] Date of Birth: 1956/05/09 Referring Provider (OT): Darliss Cheney, MD   Encounter Date: 08/13/2020   OT End of Session - 08/13/20 1101    Visit Number 18    Number of Visits 24    Date for OT Re-Evaluation 08/27/20   at renewal   Authorization Type State BCBS    Authorization Time Period VL:MN Week 3 of 4 at renewal (2/8)    OT Start Time 1101    OT Stop Time 1145    OT Time Calculation (min) 44 min    Activity Tolerance Patient tolerated treatment well    Behavior During Therapy [facility] for tasks assessed/performed           Past Medical History:  Diagnosis Date  . Complete heart block [facility]) June 2009   a. Presented with hypotension/pulm edema 12/2007 when HOCM was also discovered. s/p dual chamber MDT pacemaker. Thallium stest 7/09 negative for infiltrative disease or ischemia.  Followed Dr [name]  Marland Kitchen Heart murmur   . HOCM (hypertrophic obstructive cardiomyopathy) ([place]) 2009   Discovered by ECHO when he developed CHB. Mgmt Dr Caryl Comes  and Dr [name] at [facility]. Has outflow obstruction on ECHO 2011  . Multiple sclerosis ([place]) 1998   Sxs started 1998 with L body numbness. MRI c/w MS. Started Avonex. Saw Dr [name] at [facility] and switched to Betaseron and was on for 5 yrs but developed depression and site rxns and D/C'd 2006. While on Betaseron, occassionally required solumedrol for flares.  . Pacemaker 2017  . Prostate enlargement   . Right bundle branch block   . SOB (shortness of breath)    a. PFTs 09/2010 essentially normal per pulm note (mild obst defect). b. 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  . Wenckebach    a. Decreased ex tolerance 2010/2012 - underwent stress echo to look @ effects of MV/LVOT - nothing noted; although pt had high  rate Wenckebach behavior with associated ex intol. Pacer reprogrammed with improvement in sx.    Past Surgical History:  Procedure Laterality Date  . LEFT HEART CATHETERIZATION WITH CORONARY ANGIOGRAM N/A 08/22/2013   Procedure: LEFT HEART CATHETERIZATION WITH CORONARY ANGIOGRAM;  Surgeon: [name], MD;  Location: [facility] CATH LAB;  Service: Cardiovascular;  Laterality: N/A;  . LEG TENDON SURGERY  2008   Os peroneus resection and peroneal tendon repair  . pace Agricultural engineer    . PACEMAKER INSERTION  12/2007   Complete heart block  . PACEMAKER LEAD REMOVAL Left 07/18/2015   Procedure: ATRIAL PACEMAKER LEAD EXTRACTION; ATRIAL PACEMAKER LEAD INSERTION; GENERATOR REPLACEMENT.;  Surgeon: [name], MD;  Location: [place];  Service: Cardiovascular;  Laterality: Left;  [name] to back up  . PERIPHERAL VASCULAR CATHETERIZATION N/A 02/12/2015   Procedure: Venogram;  Surgeon: [name], MD;  Location: [place] CV LAB;  Service: Cardiovascular;  Laterality: N/A;  . TEE WITHOUT CARDIOVERSION N/A 07/18/2015   Procedure: TRANSESOPHAGEAL ECHOCARDIOGRAM (TEE);  Surgeon: [name], MD;  Location: [facility] OR;  Service: Cardiovascular;  Laterality: N/A;    There were no vitals filed for this visit.   Subjective Assessment - 08/13/20 1101    Subjective  Pt denies any pain today. Pt says he's doing pretty well.    Patient is accompanied by: Family member  Pertinent History Fall Risk. Speech Deficits.    Limitations MS, CHB s/p PPM, HOCM    Patient Stated Goals right hand. 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 Return to driving recommendations.    Currently in Pain? No/denies                        OT Treatments/Exercises (OP) - 08/13/20 1104      Hand Exercises   Other Hand Exercises Hand Gripper with level 2 and picking up 1 inch blocks with RUE      Fine Motor Coordination (Hand/Wrist)   Fine Motor Coordination Small Pegboard    Small  Pegboard with RUE - manipulation with just 1, 2, ad 3 fingers with 4 nd 5 finger holding 1 inch block to encouraged pincer grasp and rotating of pegs with first fingers. Pt did well with min drops and difficulty. removed pegs with in hand manipulation    Purdue Pegboard with emphasis on in hand manipulation with RUE. min drops and min difficulty - activity required increased time.                    OT Short Term Goals - 07/09/20 1108      OT SHORT TERM GOAL #1   Title Pt will be independent with HEP 06/21/20    Time 4    Period Weeks    Status Achieved    Target Date 06/21/20      OT SHORT TERM GOAL #2   Title Pt will demonstrate improved fine motor coordination by demonstrating decrease in 9 hole peg test score by 6 seconds with RUE.    Baseline RUE 71.06    Time 4    Period Weeks    Status Achieved      OT SHORT TERM GOAL #3   Title Pt will perform simple warm meal prep or home management task with supervision and good safety awareness.    Time 4    Period Weeks    Status Achieved      OT SHORT TERM GOAL #4   Title Pt will write 2-3 sentences with 75% legibility and increased size of letters (mild micrographia).    Baseline severe micrographia    Time 4    Period Weeks    Status Achieved   3 sentences with good letter size and legibility     OT SHORT TERM GOAL #5   Title Pt will demonstrate increase in grip strength by 5 lbs in RUE for increased ease with ADLs and clothing management, etc.    Baseline RUE 45.4 (LUE 65.6)    Time 4    Period Weeks    Status Achieved   RUE 55.7 lbs     OT SHORT TERM GOAL #6   Title Pt will report using RUE (dominant hand) for 25% or more of functional tasks.    Time 4    Period Weeks    Status Achieved             OT Long Term Goals - 08/06/20 1200      OT LONG TERM GOAL #1   Title *Pt will be independent with updated HEP 08/27/20    Time 4    Period Weeks    Status On-going      OT LONG TERM GOAL #2   Title *Pt  will demonstrate improved fine motor coordination by demonstrating decrease in 9 hole peg  test score to 26 seconds or less with RUE.    Baseline at renewal RUE 31.75 seconds    Time 8    Period Weeks    Status Revised      OT LONG TERM GOAL #3   Title *Pt will perform work simulated tasks (typing, using mouse, etc) with RUE as dominant hand with 90% accuracy.    Time 8    Period Weeks    Status On-going      OT LONG TERM GOAL #4   Title Pt will report completing at least 1 home management task per day with mod I in order to decrease caregiver burden.    Time 8    Period Weeks    Status Achieved      OT LONG TERM GOAL #5   Title Pt will improve grip strength by at least 10 lbs in RUE for strengthening dominant hand for functional use and tasks.    Baseline RUE 45.4 (LUE 65.6)    Time 8    Period Weeks    Status Achieved   67 RUE     OT LONG TERM GOAL #6   Title *Pt will report using RUE (dominant hand) for at least 75% of functional tasks per day.    Time 8    Period Weeks    Status Partially Met   report fatigue     OT LONG TERM GOAL #7   Title Pt will perform physical and cognitive tasks simultaneously with 95% accuracy for returning to prior tasks.    Time 8    Period Weeks    Status Achieved   100%     OT LONG TERM GOAL #8   Title *Pt will write 3-5 sentences with 90% legibility and appropriate sizing.    Time 8    Period Weeks    Status On-going   pt has made much improvement but continues to get smaller by end of sentence/paragraph                Plan - 08/13/20 1121    Clinical Impression Statement Pt is progressing towards remaining goals.    OT Occupational Profile and History Detailed Assessment- Review of Records and additional review of physical, cognitive, psychosocial history related to current functional performance    Occupational performance deficits (Please refer to evaluation for details): IADL's;ADL's;Social Participation;Leisure;Work;Rest and  Sleep    Body Structure / Function / Physical Skills ADL;Dexterity;Decreased knowledge of use of DME;Strength;UE functional use;Endurance;IADL;Coordination;FMC    Rehab Potential Good    Clinical Decision Making Limited treatment options, no task modification necessary    Comorbidities Affecting Occupational Performance: None    Modification or Assistance to Complete Evaluation  No modification of tasks or assist necessary to complete eval    OT Frequency 2x / week    OT Duration 4 weeks   or 8 visits from renewal 07/30/20   OT Treatment/Interventions Self-care/ADL training;DME and/or AE instruction;Gait Training;Therapeutic activities;Balance training;Cognitive remediation/compensation;Therapeutic exercise;Neuromuscular education;Passive range of motion;Visual/perceptual remediation/compensation;Patient/family education;Energy conservation    Plan mouse accuracy, handwriting    OT Home Exercise Plan coordination HEP    Consulted and Agree with Plan of Care Patient;Family member/caregiver   spouse   Family Member Consulted spouse           Patient will benefit from skilled therapeutic intervention in order to improve the following deficits and impairments:   Body Structure / Function / Physical Skills: ADL,Dexterity,Decreased knowledge of use of DME,Strength,UE functional use,Endurance,IADL,Coordination,FMC  Visit Diagnosis: Other lack of coordination  Hemiplegia and hemiparesis following cerebral infarction affecting right dominant side (HCC)  Attention and concentration deficit  Other abnormalities of gait and mobility  Muscle weakness (generalized)    Problem List Patient Active Problem List   Diagnosis Date Noted  . Stroke ([place]) 05/16/2020  . Acute focal neurological deficit 05/15/2020  . BPH (benign prostatic hyperplasia) 02/02/2016  . Fractured atrial pacemaker lead wire 07/18/2015  . 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 Chest pain 08/02/2013  . SOB (shortness  of breath) 08/02/2013  . PVC (premature ventricular contraction) 02/13/2013  . Chest pain on exertion 02/25/2012  . Pacemaker-dual-chamber Medtronic 04/29/2011  . Change in bowel habits 01/22/2011  . Weight loss 01/22/2011  . COUGH 09/01/2010  . Multiple sclerosis ([place])   . HOCM (hypertrophic obstructive cardiomyopathy) ([place])   . Right bundle branch block   . Complete heart block ([place]) 12/05/2007    Zachery Conch MOT, OTR/L  08/13/2020, 12:02 PM  [facility] [address] [place], [place], [address] Phone: [phone]   Fax:  [phone]  Name: Rodney Adkins MRN: [id number] Date of Birth: February 01, 1956

## 2020-08-13 NOTE — Patient Instructions (Signed)

## 2020-08-13 NOTE — Therapy (Signed)
[facility] [address] [place], [place], [address] Phone: [phone]   Fax:  [phone]  Physical Therapy Treatment  Patient Details  Name: Rodney Adkins MRN: [id number] Date of Birth: 12-Nov-1955 Referring Provider (PT): Otto Herb, MD   Encounter Date: 08/13/2020   PT End of Session - 08/13/20 1428    Visit Number 22    Number of Visits 27    Authorization Type BCBS PPO - FOTO patient    PT Start Time 1019    PT Stop Time 1059    PT Time Calculation (min) 40 min    Equipment Utilized During Treatment Gait belt    Activity Tolerance Patient tolerated treatment well    Behavior During Therapy WFL for tasks assessed/performed           Past Medical History:  Diagnosis Date  . Complete heart block [facility]) June 2009   a. Presented with hypotension/pulm edema 12/2007 when HOCM was also discovered. s/p dual chamber MDT pacemaker. Thallium stest 7/09 negative for infiltrative disease or ischemia.  Followed Dr [name]  Marland Kitchen Heart murmur   . HOCM (hypertrophic obstructive cardiomyopathy) ([place]) 2009   Discovered by ECHO when he developed CHB. Mgmt Dr Caryl Comes  and Dr [name] at [facility]. Has outflow obstruction on ECHO 2011  . Multiple sclerosis ([place]) 1998   Sxs started 1998 with L body numbness. MRI c/w MS. Started Avonex. Saw Dr [name] at [facility] and switched to Betaseron and was on for 5 yrs but developed depression and site rxns and D/C'd 2006. While on Betaseron, occassionally required solumedrol for flares.  . Pacemaker 2017  . Prostate enlargement   . Right bundle branch block   . SOB (shortness of breath)    a. PFTs 09/2010 essentially normal per pulm note (mild obst defect). b. 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  . 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 Pacer  reprogrammed with improvement in sx.    Past Surgical History:  Procedure Laterality Date  . LEFT HEART CATHETERIZATION WITH CORONARY ANGIOGRAM N/A 08/22/2013   Procedure: LEFT HEART CATHETERIZATION WITH CORONARY ANGIOGRAM;  Surgeon: [name], MD;  Location: [facility] CATH LAB;  Service: Cardiovascular;  Laterality: N/A;  . LEG TENDON SURGERY  2008   Os peroneus resection and peroneal tendon repair  . pace Agricultural engineer    . PACEMAKER INSERTION  12/2007   Complete heart block  . PACEMAKER LEAD REMOVAL Left 07/18/2015   Procedure: ATRIAL PACEMAKER LEAD EXTRACTION; ATRIAL PACEMAKER LEAD INSERTION; GENERATOR REPLACEMENT.;  Surgeon: [name], MD;  Location: [place];  Service: Cardiovascular;  Laterality: Left;  [name] to back up  . PERIPHERAL VASCULAR CATHETERIZATION N/A 02/12/2015   Procedure: Venogram;  Surgeon: [name], MD;  Location: [place] CV LAB;  Service: Cardiovascular;  Laterality: N/A;  . TEE WITHOUT CARDIOVERSION N/A 07/18/2015   Procedure: TRANSESOPHAGEAL ECHOCARDIOGRAM (TEE);  Surgeon: [name], MD;  Location: [facility] OR;  Service: Cardiovascular;  Laterality: N/A;    There were no vitals filed for this visit.   Subjective Assessment - 08/13/20 1021    Subjective New balance exercises are going well. Still with most difficulty with bending the leg.    Patient is accompained by: Family member    Pertinent History MS, CHB s/p PPM, HOCM  Limitations Walking;Writing;Standing;Lifting;House hold activities    Patient Stated Goals wants to be able to walk better.    Currently in Pain? No/denies                             [facility] Adult PT Treatment/Exercise - 08/13/20 0001      Ambulation/Gait   Ambulation/Gait Yes    Ambulation/Gait Assistance 4: Min guard;5: Supervision    Ambulation/Gait Assistance Details supervision with gait between activities with no AD, performed scanning environment for 2 laps around gym with pt needing supervision/min guard     Ambulation Distance (Feet) 230 Feet   then between activities with no AD   Assistive device None    Gait Pattern Step-through pattern;Decreased hip/knee flexion - right;Decreased stance time - right;Decreased arm swing - right    Ambulation Surface Level;Indoor    Gait Comments stepping over 5 obstacles of 4-6" heights over approx. 20' - performed 8 reps total, 4 reps with stepping over with RLE for incr hip/knee flexion, and then repeated stepping over with LLE for incr SLS time on RLE and foot clearance when stepping over, min guard/min A at times for balance      Exercises   Exercises Other Exercises    Other Exercises  discussed adding in hamstring stretching to pt's HEP before and after exercise - seated stretch off mat table and long sit position did not work best for pt, pt with best results from stretch lying supine and using band around foot to pull towards body for stretch, as well as modified pyramid pose stretch with using BUE support on chair that pt is familiar with from yoga      Knee/Hip Exercises: Supine   Bridges AROM;Strengthening;Both;1 set;10 reps    Bridges Limitations straight leg bridge over physioball x10 reps    Other Supine Knee/Hip Exercises supine R knee flexion with red physioball - 2 x 10 reps, cues to keep ball steady and to dig into ball with heel for hamstring activation               Balance Exercises - 08/13/20 0001      Balance Exercises: Standing   SLS Eyes open;Foam/compliant surface    SLS Limitations at bottom of staircase: standing on blue air ex: x10 reps RLE toe taps to first step and then 2nd step for incr hip/knee flexion, repeated same activity with LLE toe taps for incr SLS time on RLE 2 x 10 reps with min guard/min A for balance             PT Education - 08/13/20 1427    Education Details adding in hamstring stretch before and after his exercises (modified pyramid pose with UE support worked best for pt)    Person(s) Educated  Patient    Methods Explanation;Demonstration    Comprehension Verbalized understanding;Returned demonstration            PT Short Term Goals - 08/06/20 1028      PT SHORT TERM GOAL #1   Title Pt will improve DGI score to at least a 16/24 in order to demo decr fall risk. ALL STGS DUE 08/02/20    Baseline 13/24, 20/24    Time 3    Period Weeks    Status Achieved    Target Date 08/02/20      PT SHORT TERM GOAL #2   Title Pt will decrease 5xSTS to </= 15 seconds for  decreased fall risk.    Baseline 07/09/20 16.62 sec. 11.71 sec from chair without hands    Time 3    Period Weeks    Status Achieved      PT SHORT TERM GOAL #3   Title Pt will increase gait speed to >/= 0.8 m/s with single walking stick for increased access to community and decreased fall risk.    Baseline .69 m/s with walking stick, 11.93 seconds = .83 ft/sec no AD on 08/06/20    Time 3    Period Weeks    Status Achieved      PT SHORT TERM GOAL #4   Title Pt will decr TUG to 13 seconds or less with no AD in order to demo decr fall risk.    Baseline 14.19 seconds with no AD, 10.19 seconds no AD    Time 3    Period Weeks    Status Achieved             PT Long Term Goals - 07/12/20 1027      PT LONG TERM GOAL #1   Title Pt will be independent with progressive HEP. ALL LTGS DUE 08/23/20    Baseline added exercises with ankle weights to HEP, pt will benefit from on-going additions    Time 6    Period Weeks    Status On-going    Target Date 08/23/20      PT LONG TERM GOAL #2   Title Pt will decrease 5xSTS to </= 12 seconds to no longer be classified as fall risk and to be WNL for age and gender.    Baseline 22 s from standard arm chair without UE support. 06/19/20 17.37 sec, 07/09/20 16.62 sec    Time 6    Period Weeks    Status On-going      PT LONG TERM GOAL #3   Title Pt will improve score on DGI to at least a 19/24 in order to demo decr fall risk.    Baseline 13/24    Time 6    Period Weeks    Status  New      PT LONG TERM GOAL #4   Title Pt will reach .90 m/s gait speed for community ambulation and decreased fall risk with LRAD vs. no AD    Baseline 0.65 m/s. 06/19/20 0.23m/s with walker and 0.69m/s with cane, .69 m/s with walking stick    Time 6    Period Weeks    Status Revised      PT LONG TERM GOAL #5   Title Pt will ambulated 800+ feet on outdoor/unlevel surfaces with supervision and LRAD vs. no AD.    Baseline NT, 400' with supervision and walking stick (limited distance due to cold weather conditions)    Time 6    Period Weeks    Status On-going      PT LONG TERM GOAL #6   Title Pt will increase FOTO from 46 to >/= 70 for increased confidence in abilities.    Baseline 46 05/21/20    Time 6    Period Weeks    Status On-going                 Plan - 08/13/20 1435    Clinical Impression Statement Today's skilled session focused on RLE strengthening, dynamic gait, obstacle negotiation, and SLS activities. Pt continues to be challenged by SLS activities on RLE on compliant surfaces. Performed obstacle negotiation with no AD with pt with  shorter steps at times and with decr arm swing B. Will continue to progress towards LTGs.    Personal Factors and Comorbidities Comorbidity 3+;Age    Comorbidities MS, CHB s/p PPM, HOCM    Examination-Activity Limitations Squat;Locomotion Level;Transfers;Stairs;Stand    Examination-Participation Restrictions Yard Work;Occupation;Driving;Community Activity    Stability/Clinical Decision Making Evolving/Moderate complexity    Rehab Potential Good    PT Frequency 2x / week    PT Duration 6 weeks    PT Treatment/Interventions Gait training;Stair training;Functional mobility training;Therapeutic activities;Therapeutic exercise;Balance training;Neuromuscular re-education;Patient/family education;Orthotic Fit/Training;DME Instruction;ADLs/Self Care Home Management;Energy conservation;Manual techniques;Vestibular    PT Next Visit Plan Gait  training without AD incorporating more dynamic gait activities with reciprocal steps/head turns and obstacle negotiation.  floor <> stand transfers. corner balance exercises and on compliant surfaces, RLE strength. LTGs due at end of next week.    Consulted and Agree with Plan of Care Patient;Family member/caregiver    Family Member Consulted Wife           Patient will benefit from skilled therapeutic intervention in order to improve the following deficits and impairments:  Abnormal gait,Decreased coordination,Difficulty walking,Decreased endurance,Decreased activity tolerance,Impaired perceived functional ability,Decreased balance,Decreased mobility,Decreased strength,Improper body mechanics  Visit Diagnosis: Other lack of coordination  Hemiplegia and hemiparesis following cerebral infarction affecting right dominant side ([place])  Other abnormalities of gait and mobility  Muscle weakness (generalized)     Problem List Patient Active Problem List   Diagnosis Date Noted  . Stroke ([place]) 05/16/2020  . Acute focal neurological deficit 05/15/2020  . BPH (benign prostatic hyperplasia) 02/02/2016  . Fractured atrial pacemaker lead wire 07/18/2015  . 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 Chest pain 08/02/2013  . SOB (shortness of breath) 08/02/2013  . PVC (premature ventricular contraction) 02/13/2013  . Chest pain on exertion 02/25/2012  . Pacemaker-dual-chamber Medtronic 04/29/2011  . Change in bowel habits 01/22/2011  . Weight loss 01/22/2011  . COUGH 09/01/2010  . Multiple sclerosis ([place])   . HOCM (hypertrophic obstructive cardiomyopathy) ([place])   . Right bundle branch block   . Complete heart block ([place]) 12/05/2007    Arliss Journey, PT, DPT  08/13/2020, 2:36 PM  [facility] [address] [place], [place], [address] Phone: [phone]   Fax:  [phone]  Name: BACH ROCCHI MRN: [id number] Date of Birth:  Jul 14, 1955

## 2020-08-15 ENCOUNTER — Ambulatory Visit: Payer: BC Managed Care – PPO

## 2020-08-15 ENCOUNTER — Ambulatory Visit: Payer: BC Managed Care – PPO | Admitting: Occupational Therapy

## 2020-08-15 ENCOUNTER — Encounter: Payer: Self-pay | Admitting: Occupational Therapy

## 2020-08-15 ENCOUNTER — Other Ambulatory Visit: Payer: Self-pay

## 2020-08-15 DIAGNOSIS — I69351 Hemiplegia and hemiparesis following cerebral infarction affecting right dominant side: Secondary | ICD-10-CM

## 2020-08-15 DIAGNOSIS — R278 Other lack of coordination: Secondary | ICD-10-CM

## 2020-08-15 DIAGNOSIS — R41840 Attention and concentration deficit: Secondary | ICD-10-CM

## 2020-08-15 DIAGNOSIS — M6281 Muscle weakness (generalized): Secondary | ICD-10-CM

## 2020-08-15 DIAGNOSIS — R2689 Other abnormalities of gait and mobility: Secondary | ICD-10-CM

## 2020-08-15 DIAGNOSIS — R471 Dysarthria and anarthria: Secondary | ICD-10-CM | POA: Diagnosis not present

## 2020-08-15 NOTE — Therapy (Signed)
[facility] [address] [place], [place], [address] Phone: [phone]   Fax:  [phone]  Occupational Therapy Treatment  Patient Details  Name: Rodney Adkins MRN: [id number] Date of Birth: April 16, 1956 Referring Provider (OT): Hughie Closs, MD   Encounter Date: 08/15/2020   OT End of Session - 08/15/20 1059    Visit Number 19    Number of Visits 24    Date for OT Re-Evaluation 08/27/20   at renewal   Authorization Type State BCBS    Authorization Time Period VL:[place] Week 3 of 4 at renewal (2/8)    OT Start Time 1058    OT Stop Time 1140    OT Time Calculation (min) 42 min    Activity Tolerance Patient tolerated treatment well    Behavior During Therapy [facility] for tasks assessed/performed           Past Medical History:  Diagnosis Date  . Complete heart block [facility]) June 2009   a. Presented with hypotension/pulm edema 12/2007 when HOCM was also discovered. s/p dual chamber MDT pacemaker. Thallium stest 7/09 negative for infiltrative disease or ischemia.  Followed Dr Graciela Husbands.  Marland Kitchen Heart murmur   . HOCM (hypertrophic obstructive cardiomyopathy) ([facility]) 2009   Discovered by ECHO when he developed CHB. Mgmt Dr Graciela Husbands  and Dr Regino Schultze at [facility]. Has outflow obstruction on ECHO 2011  . Multiple sclerosis ([facility]) 1998   Sxs started 1998 with L body numbness. MRI c/w MS. Started Avonex. Saw Dr Leotis Shames at [facility] and switched to Betaseron and was on for 5 yrs but developed depression and site rxns and D/C'd 2006. While on Betaseron, occassionally required solumedrol for flares.  . Pacemaker 2017  . Prostate enlargement   . Right bundle branch block   . SOB (shortness of breath)    a. PFTs 09/2010 essentially normal per pulm note (mild obst defect). b. 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  . Wenckebach    a. Decreased ex tolerance 2010/2012 - underwent stress echo to look @ effects of MV/LVOT - nothing noted; although pt had high  rate Wenckebach behavior with associated ex intol. Pacer reprogrammed with improvement in sx.    Past Surgical History:  Procedure Laterality Date  . LEFT HEART CATHETERIZATION WITH CORONARY ANGIOGRAM N/A 08/22/2013   Procedure: LEFT HEART CATHETERIZATION WITH CORONARY ANGIOGRAM;  Surgeon: Lesleigh Noe, MD;  Location: [facility] CATH LAB;  Service: Cardiovascular;  Laterality: N/A;  . LEG TENDON SURGERY  2008   Os peroneus resection and peroneal tendon repair  . pace Cytogeneticist    . PACEMAKER INSERTION  12/2007   Complete heart block  . PACEMAKER LEAD REMOVAL Left 07/18/2015   Procedure: ATRIAL PACEMAKER LEAD EXTRACTION; ATRIAL PACEMAKER LEAD INSERTION; GENERATOR REPLACEMENT.;  Surgeon: Marinus Maw, MD;  Location: [facility] OR;  Service: Cardiovascular;  Laterality: Left;  [name] to back up  . PERIPHERAL VASCULAR CATHETERIZATION N/A 02/12/2015   Procedure: Venogram;  Surgeon: [name], MD;  Location: [facility] INVASIVE CV LAB;  Service: Cardiovascular;  Laterality: N/A;  . TEE WITHOUT CARDIOVERSION N/A 07/18/2015   Procedure: TRANSESOPHAGEAL ECHOCARDIOGRAM (TEE);  Surgeon: Marinus Maw, MD;  Location: [facility] OR;  Service: Cardiovascular;  Laterality: N/A;    There were no vitals filed for this visit.   Subjective Assessment - 08/15/20 1059    Subjective  Pt denies any pain.    Patient is accompanied by: Family member    Pertinent History Fall Risk. Speech Deficits.  Limitations MS, CHB s/p PPM, HOCM    Patient Stated Goals right hand. 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 Return to driving recommendations.    Currently in Pain? No/denies                        OT Treatments/Exercises (OP) - 08/15/20 1102      ADLs   Writing pt reports handwriting is getting better but still having to print. Education provided on using print is more legibile and easier for increasing overall legibility. pt was reading things for work upon arrival of session  and was saying the most difficulty part is having to write notes and it being legible      Fine Motor Coordination (Hand/Wrist)   Fine Motor Coordination Manipulation of small objects;Small Pegboard    Small Pegboard RUE coordination on vertical surface for increasing coordination and with added shoulder stability component. Pt with min difficulty and min drops and ability to copy pattern iwth min difficulty - sef-corrected errors    Manipulation of small objects 41% of targets and with 89% accuracy; 51% of targets and with 100% accuracy    Tracing tracing ABC's with improved coordination and accuracy from previous times. Pt traced with highlighter and than again with ballpoint pen with coban. pt reports he wrote something and gave it to his wife to read and she was able to read all of it. Did tracing for shapes and for distal finger control worksheet for increased coordination. pt reports that with fatigue, handwriting gets smaller. Education provided on using lined paper and using lines as guide for keeping letters filling up entire line for maintaining uniformity with letters                    OT Short Term Goals - 07/09/20 1108      OT SHORT TERM GOAL #1   Title Pt will be independent with HEP 06/21/20    Time 4    Period Weeks    Status Achieved    Target Date 06/21/20      OT SHORT TERM GOAL #2   Title Pt will demonstrate improved fine motor coordination by demonstrating decrease in 9 hole peg test score by 6 seconds with RUE.    Baseline RUE 71.06    Time 4    Period Weeks    Status Achieved      OT SHORT TERM GOAL #3   Title Pt will perform simple warm meal prep or home management task with supervision and good safety awareness.    Time 4    Period Weeks    Status Achieved      OT SHORT TERM GOAL #4   Title Pt will write 2-3 sentences with 75% legibility and increased size of letters (mild micrographia).    Baseline severe micrographia    Time 4    Period Weeks     Status Achieved   3 sentences with good letter size and legibility     OT SHORT TERM GOAL #5   Title Pt will demonstrate increase in grip strength by 5 lbs in RUE for increased ease with ADLs and clothing management, etc.    Baseline RUE 45.4 (LUE 65.6)    Time 4    Period Weeks    Status Achieved   RUE 55.7 lbs     OT SHORT TERM GOAL #6   Title Pt will report using RUE (  dominant hand) for 25% or more of functional tasks.    Time 4    Period Weeks    Status Achieved             OT Long Term Goals - 08/06/20 1200      OT LONG TERM GOAL #1   Title *Pt will be independent with updated HEP 08/27/20    Time 4    Period Weeks    Status On-going      OT LONG TERM GOAL #2   Title *Pt will demonstrate improved fine motor coordination by demonstrating decrease in 9 hole peg test score to 26 seconds or less with RUE.    Baseline at renewal RUE 31.75 seconds    Time 8    Period Weeks    Status Revised      OT LONG TERM GOAL #3   Title *Pt will perform work simulated tasks (typing, using mouse, etc) with RUE as dominant hand with 90% accuracy.    Time 8    Period Weeks    Status On-going      OT LONG TERM GOAL #4   Title Pt will report completing at least 1 home management task per day with mod I in order to decrease caregiver burden.    Time 8    Period Weeks    Status Achieved      OT LONG TERM GOAL #5   Title Pt will improve grip strength by at least 10 lbs in RUE for strengthening dominant hand for functional use and tasks.    Baseline RUE 45.4 (LUE 65.6)    Time 8    Period Weeks    Status Achieved   67 RUE     OT LONG TERM GOAL #6   Title *Pt will report using RUE (dominant hand) for at least 75% of functional tasks per day.    Time 8    Period Weeks    Status Partially Met   report fatigue     OT LONG TERM GOAL #7   Title Pt will perform physical and cognitive tasks simultaneously with 95% accuracy for returning to prior tasks.    Time 8    Period Weeks     Status Achieved   100%     OT LONG TERM GOAL #8   Title *Pt will write 3-5 sentences with 90% legibility and appropriate sizing.    Time 8    Period Weeks    Status On-going   pt has made much improvement but continues to get smaller by end of sentence/paragraph                Plan - 08/15/20 1118    Clinical Impression Statement Pt is progressing towards remaining goals.    OT Occupational Profile and History Detailed Assessment- Review of Records and additional review of physical, cognitive, psychosocial history related to current functional performance    Occupational performance deficits (Please refer to evaluation for details): IADL's;ADL's;Social Participation;Leisure;Work;Rest and Sleep    Body Structure / Function / Physical Skills ADL;Dexterity;Decreased knowledge of use of DME;Strength;UE functional use;Endurance;IADL;Coordination;FMC    Rehab Potential Good    Clinical Decision Making Limited treatment options, no task modification necessary    Comorbidities Affecting Occupational Performance: None    Modification or Assistance to Complete Evaluation  No modification of tasks or assist necessary to complete eval    OT Frequency 2x / week    OT Duration 4 weeks   or  8 visits from renewal 07/30/20   OT Treatment/Interventions Self-care/ADL training;DME and/or AE instruction;Gait Training;Therapeutic activities;Balance training;Cognitive remediation/compensation;Therapeutic exercise;Neuromuscular education;Passive range of motion;Visual/perceptual remediation/compensation;Patient/family education;Energy conservation    Plan mouse accuracy, handwriting, renew next week (2/17)    OT Home Exercise Plan coordination HEP    Consulted and Agree with Plan of Care Patient;Family member/caregiver   spouse   Family Member Consulted spouse           Patient will benefit from skilled therapeutic intervention in order to improve the following deficits and impairments:   Body  Structure / Function / Physical Skills: ADL,Dexterity,Decreased knowledge of use of DME,Strength,UE functional use,Endurance,IADL,Coordination,FMC       Visit Diagnosis: Other lack of coordination  Hemiplegia and hemiparesis following cerebral infarction affecting right dominant side ([facility])  Muscle weakness (generalized)  Attention and concentration deficit    Problem List Patient Active Problem List   Diagnosis Date Noted  . Stroke ([place]) 05/16/2020  . Acute focal neurological deficit 05/15/2020  . BPH (benign prostatic hyperplasia) 02/02/2016  . Fractured atrial pacemaker lead wire 07/18/2015  . 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 Chest pain 08/02/2013  . SOB (shortness of breath) 08/02/2013  . PVC (premature ventricular contraction) 02/13/2013  . Chest pain on exertion 02/25/2012  . Pacemaker-dual-chamber Medtronic 04/29/2011  . Change in bowel habits 01/22/2011  . Weight loss 01/22/2011  . COUGH 09/01/2010  . Multiple sclerosis ([place])   . HOCM (hypertrophic obstructive cardiomyopathy) ([place])   . Right bundle branch block   . Complete heart block ([place]) 12/05/2007    Zachery Conch MOT, OTR/L  08/15/2020, 11:40 AM  [facility] [address] [place], [place], [address] Phone: [phone]   Fax:  [phone]  Name: Rodney Adkins MRN: [id number] Date of Birth: 10-01-1955

## 2020-08-15 NOTE — Therapy (Signed)
[facility] [address] [place], [place], [address] Phone: [phone]   Fax:  [phone]  Physical Therapy Treatment  Patient Details  Name: Rodney Adkins MRN: [id number] Date of Birth: [birth year] Referring Provider (PT): Otto Herb, MD   Encounter Date: 08/15/2020   PT End of Session - 08/15/20 0900    Visit Number 23    Number of Visits 27    Authorization Type BCBS PPO - FOTO patient    PT Start Time 0855    PT Stop Time 0935    PT Time Calculation (min) 40 min    Equipment Utilized During Treatment Gait belt    Activity Tolerance Patient tolerated treatment well    Behavior During Therapy WFL for tasks assessed/performed           Past Medical History:  Diagnosis Date  . Complete heart block [facility]) June 2009   a. Presented with hypotension/pulm edema 12/2007 when HOCM was also discovered. s/p dual chamber MDT pacemaker. Thallium stest 7/09 negative for infiltrative disease or ischemia.  Followed Dr [name]  Marland Kitchen Heart murmur   . HOCM (hypertrophic obstructive cardiomyopathy) ([place]) 2009   Discovered by ECHO when he developed CHB. Mgmt Dr Caryl Comes  and Dr [name] at [facility]. Has outflow obstruction on ECHO 2011  . Multiple sclerosis ([place]) 1998   Sxs started 1998 with L body numbness. MRI c/w MS. Started Avonex. Saw Dr [name] at [facility] and switched to Betaseron and was on for 5 yrs but developed depression and site rxns and D/C'd 2006. While on Betaseron, occassionally required solumedrol for flares.  . Pacemaker 2017  . Prostate enlargement   . Right bundle branch block   . SOB (shortness of breath)    a. PFTs 09/2010 essentially normal per pulm note (mild obst defect). b. 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  . 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  Pacer reprogrammed with improvement in sx.    Past Surgical History:  Procedure Laterality Date  . LEFT HEART CATHETERIZATION WITH CORONARY ANGIOGRAM N/A 08/22/2013   Procedure: LEFT HEART CATHETERIZATION WITH CORONARY ANGIOGRAM;  Surgeon: [name], MD;  Location: [facility] CATH LAB;  Service: Cardiovascular;  Laterality: N/A;  . LEG TENDON SURGERY  2008   Os peroneus resection and peroneal tendon repair  . pace Agricultural engineer    . PACEMAKER INSERTION  12/2007   Complete heart block  . PACEMAKER LEAD REMOVAL Left 07/18/2015   Procedure: ATRIAL PACEMAKER LEAD EXTRACTION; ATRIAL PACEMAKER LEAD INSERTION; GENERATOR REPLACEMENT.;  Surgeon: [name], MD;  Location: [facility];  Service: Cardiovascular;  Laterality: Left;  [name] to back up  . PERIPHERAL VASCULAR CATHETERIZATION N/A 02/12/2015   Procedure: Venogram;  Surgeon: [name], MD;  Location: [facility] CV LAB;  Service: Cardiovascular;  Laterality: N/A;  . TEE WITHOUT CARDIOVERSION N/A 07/18/2015   Procedure: TRANSESOPHAGEAL ECHOCARDIOGRAM (TEE);  Surgeon: [name], MD;  Location: [facility] OR;  Service: Cardiovascular;  Laterality: N/A;    There were no vitals filed for this visit.   Subjective Assessment - 08/15/20 0857    Subjective No changes to report, RLE still stiff    Patient is accompained by: --    Pertinent History MS, CHB s/p PPM, HOCM    Limitations Walking;Writing;Standing;Lifting;House hold activities    Patient  Stated Goals wants to be able to walk better.    Currently in Pain? No/denies                             [facility] Adult PT Treatment/Exercise - 08/15/20 0001      Transfers   Sit to Stand 4: Min guard;With upper extremity assist;From bed    Sit to Stand Details Tactile cues for weight beaing   perfomed from airex 15x with UE support needed to stand but not sit     Exercises   Other Exercises  Scifit 8' at L2.0      Knee/Hip Exercises: Stretches   Active Hamstring Stretch Right;3 reps;30  seconds    Active Hamstring Stretch Limitations used gait belt to stretch      Knee/Hip Exercises: Supine   Bridges Right;Strengthening;2 sets;15 reps   single leg in fig 4 position   Other Supine Knee/Hip Exercises RLE on physioball for [facility] 2x15               Balance Exercises - 08/15/20 0001      Balance Exercises: Standing   SLS Eyes open;2 reps;Foam/compliant surface   15 reps eac LE o 1st then second step for total of 15x4 with intermittent UE support on rails   SLS Limitations at bottom of staircase: standing on blue air ex: x15 reps    SLS with Vectors Solid surface;2 reps;30 secs    SLS with Vectors Limitations standing in // bars had patient manipulate soccer ball for 30s bouts 2x per LE    Other Standing Exercises performed step overs using 4' beam, 15x per LE with single UE support, then 2' w/o UE suport with CGA of PT as needed               PT Short Term Goals - 08/06/20 1028      PT SHORT TERM GOAL #1   Title Pt will improve DGI score to at least a 16/24 in order to demo decr fall risk. ALL STGS DUE 08/02/20    Baseline 13/24, 20/24    Time 3    Period Weeks    Status Achieved    Target Date 08/02/20      PT SHORT TERM GOAL #2   Title Pt will decrease 5xSTS to </= 15 seconds for decreased fall risk.    Baseline 07/09/20 16.62 sec. 11.71 sec from chair without hands    Time 3    Period Weeks    Status Achieved      PT SHORT TERM GOAL #3   Title Pt will increase gait speed to >/= 0.8 m/s with single walking stick for increased access to community and decreased fall risk.    Baseline .69 m/s with walking stick, 11.93 seconds = .83 ft/sec no AD on 08/06/20    Time 3    Period Weeks    Status Achieved      PT SHORT TERM GOAL #4   Title Pt will decr TUG to 13 seconds or less with no AD in order to demo decr fall risk.    Baseline 14.19 seconds with no AD, 10.19 seconds no AD    Time 3    Period Weeks    Status Achieved             PT Long Term  Goals - 07/12/20 1027      PT LONG TERM GOAL #1  Title Pt will be independent with progressive HEP. ALL LTGS DUE 08/23/20    Baseline added exercises with ankle weights to HEP, pt will benefit from on-going additions    Time 6    Period Weeks    Status On-going    Target Date 08/23/20      PT LONG TERM GOAL #2   Title Pt will decrease 5xSTS to </= 12 seconds to no longer be classified as fall risk and to be WNL for age and gender.    Baseline 22 s from standard arm chair without UE support. 06/19/20 17.37 sec, 07/09/20 16.62 sec    Time 6    Period Weeks    Status On-going      PT LONG TERM GOAL #3   Title Pt will improve score on DGI to at least a 19/24 in order to demo decr fall risk.    Baseline 13/24    Time 6    Period Weeks    Status New      PT LONG TERM GOAL #4   Title Pt will reach .90 m/s gait speed for community ambulation and decreased fall risk with LRAD vs. no AD    Baseline 0.65 m/s. 06/19/20 0.11m/s with walker and 0.33m/s with cane, .69 m/s with walking stick    Time 6    Period Weeks    Status Revised      PT LONG TERM GOAL #5   Title Pt will ambulated 800+ feet on outdoor/unlevel surfaces with supervision and LRAD vs. no AD.    Baseline NT, 400' with supervision and walking stick (limited distance due to cold weather conditions)    Time 6    Period Weeks    Status On-going      PT LONG TERM GOAL #6   Title Pt will increase FOTO from 46 to >/= 70 for increased confidence in abilities.    Baseline 46 05/21/20    Time 6    Period Weeks    Status On-going                 Plan - 08/15/20 0946    Clinical Impression Statement Focus of today session was R hamstring strength and stretch followe dby stepping and SLS tasks to encourage RLE weigtbearing for stability, decreased R pelvic fwd ortation noted with RLE stepping    Personal Factors and Comorbidities Comorbidity 3+;Age    Comorbidities MS, CHB s/p PPM, HOCM    Examination-Activity Limitations  Squat;Locomotion Level;Transfers;Stairs;Stand    Examination-Participation Restrictions Yard Work;Occupation;Driving;Community Activity    Stability/Clinical Decision Making Evolving/Moderate complexity    Rehab Potential Good    PT Frequency 2x / week    PT Duration 6 weeks    PT Treatment/Interventions Gait training;Stair training;Functional mobility training;Therapeutic activities;Therapeutic exercise;Balance training;Neuromuscular re-education;Patient/family education;Orthotic Fit/Training;DME Instruction;ADLs/Self Care Home Management;Energy conservation;Manual techniques;Vestibular    PT Next Visit Plan Continue to encourage SLS activities as well as proper pelvic rotation during stepping and SLS tasks    Consulted and Agree with Plan of Care Patient    Family Member Consulted --           Patient will benefit from skilled therapeutic intervention in order to improve the following deficits and impairments:  Abnormal gait,Decreased coordination,Difficulty walking,Decreased endurance,Decreased activity tolerance,Impaired perceived functional ability,Decreased balance,Decreased mobility,Decreased strength,Improper body mechanics  Visit Diagnosis: Other lack of coordination  Hemiplegia and hemiparesis following cerebral infarction affecting right dominant side ([place])  Other abnormalities of gait and mobility  Problem List Patient Active Problem List   Diagnosis Date Noted  . Stroke ([place]) 05/16/2020  . Acute focal neurological deficit 05/15/2020  . BPH (benign prostatic hyperplasia) 02/02/2016  . Fractured atrial pacemaker lead wire 07/18/2015  . 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 Chest pain 08/02/2013  . SOB (shortness of breath) 08/02/2013  . PVC (premature ventricular contraction) 02/13/2013  . Chest pain on exertion 02/25/2012  . Pacemaker-dual-chamber Medtronic 04/29/2011  . Change in bowel habits 01/22/2011  . Weight loss 01/22/2011  . COUGH 09/01/2010  .  Multiple sclerosis ([place])   . HOCM (hypertrophic obstructive cardiomyopathy) ([place])   . Right bundle branch block   . Complete heart block ([place]) 12/05/2007    Lanice Shirts PT 08/15/2020, 10:06 AM  [facility] [address] [place], [place], [address] Phone: [phone]   Fax:  [phone]  Name: Rodney Adkins MRN: [id number] Date of Birth: 1956/05/28

## 2020-08-20 ENCOUNTER — Ambulatory Visit: Payer: BC Managed Care – PPO | Admitting: Occupational Therapy

## 2020-08-20 ENCOUNTER — Other Ambulatory Visit: Payer: Self-pay

## 2020-08-20 ENCOUNTER — Ambulatory Visit: Payer: BC Managed Care – PPO | Admitting: Physical Therapy

## 2020-08-20 ENCOUNTER — Encounter: Payer: Self-pay | Admitting: Physical Therapy

## 2020-08-20 ENCOUNTER — Encounter: Payer: Self-pay | Admitting: Occupational Therapy

## 2020-08-20 DIAGNOSIS — R471 Dysarthria and anarthria: Secondary | ICD-10-CM | POA: Diagnosis not present

## 2020-08-20 DIAGNOSIS — R2689 Other abnormalities of gait and mobility: Secondary | ICD-10-CM

## 2020-08-20 DIAGNOSIS — R41840 Attention and concentration deficit: Secondary | ICD-10-CM

## 2020-08-20 DIAGNOSIS — M6281 Muscle weakness (generalized): Secondary | ICD-10-CM

## 2020-08-20 DIAGNOSIS — R278 Other lack of coordination: Secondary | ICD-10-CM

## 2020-08-20 DIAGNOSIS — I69351 Hemiplegia and hemiparesis following cerebral infarction affecting right dominant side: Secondary | ICD-10-CM

## 2020-08-20 NOTE — Therapy (Signed)
[facility] [address] [place], [place], [address] Phone: [phone]   Fax:  [phone]  Physical Therapy Treatment  Patient Details  Name: Rodney Adkins MRN: [id number] Date of Birth: 05-Mar-1956 Referring Provider (PT): Otto Herb, MD   Encounter Date: 08/20/2020   PT End of Session - 08/20/20 1324    Visit Number 24    Number of Visits 27    Authorization Type BCBS PPO - FOTO patient    PT Start Time 1015    PT Stop Time 1058    PT Time Calculation (min) 43 min    Equipment Utilized During Treatment Gait belt    Activity Tolerance Patient tolerated treatment well    Behavior During Therapy WFL for tasks assessed/performed           Past Medical History:  Diagnosis Date  . Complete heart block [facility]) June 2009   a. Presented with hypotension/pulm edema 12/2007 when HOCM was also discovered. s/p dual chamber MDT pacemaker. Thallium stest 7/09 negative for infiltrative disease or ischemia.  Followed Dr [name]  Marland Kitchen Heart murmur   . HOCM (hypertrophic obstructive cardiomyopathy) ([place]) 2009   Discovered by ECHO when he developed CHB. Mgmt Dr Caryl Comes  and Dr [name] at [facility]. Has outflow obstruction on ECHO 2011  . Multiple sclerosis ([place]) 1998   Sxs started 1998 with L body numbness. MRI c/w MS. Started Avonex. Saw Dr [name] at [facility] and switched to Betaseron and was on for 5 yrs but developed depression and site rxns and D/C'd 2006. While on Betaseron, occassionally required solumedrol for flares.  . Pacemaker 2017  . Prostate enlargement   . Right bundle branch block   . SOB (shortness of breath)    a. PFTs 09/2010 essentially normal per pulm note (mild obst defect). b. 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  . 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  Pacer reprogrammed with improvement in sx.    Past Surgical History:  Procedure Laterality Date  . LEFT HEART CATHETERIZATION WITH CORONARY ANGIOGRAM N/A 08/22/2013   Procedure: LEFT HEART CATHETERIZATION WITH CORONARY ANGIOGRAM;  Surgeon: [name], MD;  Location: [facility] CATH LAB;  Service: Cardiovascular;  Laterality: N/A;  . LEG TENDON SURGERY  2008   Os peroneus resection and peroneal tendon repair  . pace Agricultural engineer    . PACEMAKER INSERTION  12/2007   Complete heart block  . PACEMAKER LEAD REMOVAL Left 07/18/2015   Procedure: ATRIAL PACEMAKER LEAD EXTRACTION; ATRIAL PACEMAKER LEAD INSERTION; GENERATOR REPLACEMENT.;  Surgeon: [name], MD;  Location: [facility];  Service: Cardiovascular;  Laterality: Left;  [name] to back up  . PERIPHERAL VASCULAR CATHETERIZATION N/A 02/12/2015   Procedure: Venogram;  Surgeon: [name], MD;  Location: [facility] CV LAB;  Service: Cardiovascular;  Laterality: N/A;  . TEE WITHOUT CARDIOVERSION N/A 07/18/2015   Procedure: TRANSESOPHAGEAL ECHOCARDIOGRAM (TEE);  Surgeon: [name], MD;  Location: [facility] OR;  Service: Cardiovascular;  Laterality: N/A;    There were no vitals filed for this visit.   Subjective Assessment - 08/20/20 1016    Subjective No changes since he was last here. Exercises still going well at home - balance on the right leg is still shaky. Carrying objects in the garden over the weekend made him feel wobbly.  Pertinent History MS, CHB s/p PPM, HOCM    Limitations Walking;Writing;Standing;Lifting;House hold activities    Patient Stated Goals wants to be able to walk better    Currently in Pain? No/denies              [facility] PT Assessment - 08/20/20 1027      Observation/Other Assessments   Focus on Therapeutic Outcomes (FOTO)  52      Functional Gait  Assessment   Gait assessed  Yes    Gait Level Surface Walks 20 ft in less than 7 sec but greater than 5.5 sec, uses assistive device, slower speed, mild gait deviations,  or deviates 6-10 in outside of the 12 in walkway width.   6.6 seconds   Change in Gait Speed Able to change speed, demonstrates mild gait deviations, deviates 6-10 in outside of the 12 in walkway width, or no gait deviations, unable to achieve a major change in velocity, or uses a change in velocity, or uses an assistive device.    Gait with Horizontal Head Turns Performs head turns smoothly with no change in gait. Deviates no more than 6 in outside 12 in walkway width    Gait with Vertical Head Turns Performs head turns with no change in gait. Deviates no more than 6 in outside 12 in walkway width.    Gait and Pivot Turn Pivot turns safely within 3 sec and stops quickly with no loss of balance.    Step Over Obstacle Is able to step over one shoe box (4.5 in total height) without changing gait speed. No evidence of imbalance.    Gait with Narrow Base of Support Ambulates 7-9 steps.    Gait with Eyes Closed Walks 20 ft, slow speed, abnormal gait pattern, evidence for imbalance, deviates 10-15 in outside 12 in walkway width. Requires more than 9 sec to ambulate 20 ft.   13.6   Ambulating Backwards Walks 20 ft, uses assistive device, slower speed, mild gait deviations, deviates 6-10 in outside 12 in walkway width.    Steps Alternating feet, must use rail.    Total Score 22    FGA comment: 22/30 = moderate fall risk                         [facility] Adult PT Treatment/Exercise - 08/20/20 1023      Transfers   Sit to Stand 5: Supervision;Without upper extremity assist    Five time sit to stand comments  12.6 seconds 1st attempt, 11.4 seconds second attempt no UE support from standard height chair    Stand to Sit 5: Supervision;Without upper extremity assist      Ambulation/Gait   Ambulation/Gait Yes    Ambulation/Gait Assistance 5: Supervision    Ambulation/Gait Assistance Details with no AD between activities    Assistive device None    Gait Pattern Step-through pattern;Decreased  hip/knee flexion - right;Decreased stance time - right;Decreased arm swing - right    Ambulation Surface Level;Indoor      Dynamic Gait Index   Level Surface Mild Impairment    Change in Gait Speed Mild Impairment    Gait with Horizontal Head Turns Normal    Gait with Vertical Head Turns Normal    Gait and Pivot Turn Normal    Step Over Obstacle Normal    Step Around Obstacles Normal    Steps Mild Impairment    Total Score 21  Balance Exercises - 08/20/20 0001      Balance Exercises: Standing   SLS with Vectors Foam/compliant surface    SLS with Vectors Limitations On blue mat: x15 reps standing on RLE x15 reps toe taps with LLE to cone, beginning with UE support and progressing to none, min guard/min A for balance    Marching Foam/compliant surface   Marching Limitations marching at countertop on blue mat down and back x4 reps, cues for slowed and controlled    Other Standing Exercises walking with eyes closed at the countertop with fingertip support down and back x3 reps slowed and controlled          Pt asking what other exercises he can perform for BLE strengthening at end of session - discussed will review HEP at next visit and revise/add any appropriate strength.    PT Education - 08/20/20 1324    Education Details progress towards goals    Person(s) Educated Patient    Methods Explanation    Comprehension Verbalized understanding            PT Short Term Goals - 08/06/20 1028      PT SHORT TERM GOAL #1   Title Pt will improve DGI score to at least a 16/24 in order to demo decr fall risk. ALL STGS DUE 08/02/20    Baseline 13/24, 20/24    Time 3    Period Weeks    Status Achieved    Target Date 08/02/20      PT SHORT TERM GOAL #2   Title Pt will decrease 5xSTS to </= 15 seconds for decreased fall risk.    Baseline 07/09/20 16.62 sec. 11.71 sec from chair without hands    Time 3    Period Weeks    Status Achieved      PT SHORT TERM GOAL #3    Title Pt will increase gait speed to >/= 0.8 m/s with single walking stick for increased access to community and decreased fall risk.    Baseline .69 m/s with walking stick, 11.93 seconds = .83 ft/sec no AD on 08/06/20    Time 3    Period Weeks    Status Achieved      PT SHORT TERM GOAL #4   Title Pt will decr TUG to 13 seconds or less with no AD in order to demo decr fall risk.    Baseline 14.19 seconds with no AD, 10.19 seconds no AD    Time 3    Period Weeks    Status Achieved             PT Long Term Goals - 08/20/20 1022      PT LONG TERM GOAL #1   Title Pt will be independent with progressive HEP. ALL LTGS DUE 08/23/20    Baseline added exercises with ankle weights to HEP, pt will benefit from on-going additions    Time 6    Period Weeks    Status On-going      PT LONG TERM GOAL #2   Title Pt will decrease 5xSTS to </= 12 seconds to no longer be classified as fall risk and to be WNL for age and gender.    Baseline 07/09/20 16.62 sec, 11.4 seconds on 08/20/20    Time 6    Period Weeks    Status Achieved      PT LONG TERM GOAL #3   Title Pt will improve score on DGI to at least a 19/24 in  order to demo decr fall risk.    Baseline 13/24, 21/24 on 08/20/20    Time 6    Period Weeks    Status Achieved      PT LONG TERM GOAL #4   Title Pt will reach .90 m/s gait speed for community ambulation and decreased fall risk with LRAD vs. no AD    Baseline 0.65 m/s. 06/19/20 0.1ms with walker and 0.713m with cane, .69 m/s with walking stick    Time 6    Period Weeks    Status Revised      PT LONG TERM GOAL #5   Title Pt will ambulated 800+ feet on outdoor/unlevel surfaces with supervision and LRAD vs. no AD.    Baseline NT, 400' with supervision and walking stick (limited distance due to cold weather conditions)    Time 6    Period Weeks    Status On-going      PT LONG TERM GOAL #6   Title Pt will increase FOTO from 46 to >/= 70 for increased confidence in abilities.     Baseline 46 05/21/20, 52 on 08/20/20    Time 6    Period Weeks    Status Not Met               Plan - 08/20/20 1332    Clinical Impression Statement Focus of today's skilled session was beginning to assess LTGs for re-cert. Pt achieved LTG #2 and 3. Pt's DGI score was a 21/24 and performed 5x sit <> stand with no UE support in 11.4 seconds, decr pt's risk of falls. Performed the FGA with pt scoring a 22/30, indicating a moderate fall risk. Pt did not meet LTG #6 - pt scored a 52 on the FOTO today (previously 4628 Pt improving with dynamic gait tasks with no AD, but continues to have most difficulty with obstacle negotiation, balance/gait with eyes closed, SLS activities on RLE, and balance on compliant surfaces. Will assess remainder of goals at next session and will plan to re-cert to continue to work on balance, strength, gait, in order to improve functional mobility and decr fall risk.    Personal Factors and Comorbidities Comorbidity 3+;Age    Comorbidities MS, CHB s/p PPM, HOCM    Examination-Activity Limitations Squat;Locomotion Level;Transfers;Stairs;Stand    Examination-Participation Restrictions Yard Work;Occupation;Driving;Community Activity    Stability/Clinical Decision Making Evolving/Moderate complexity    Rehab Potential Good    PT Frequency 2x / week    PT Duration 6 weeks    PT Treatment/Interventions Gait training;Stair training;Functional mobility training;Therapeutic activities;Therapeutic exercise;Balance training;Neuromuscular re-education;Patient/family education;Orthotic Fit/Training;DME Instruction;ADLs/Self Care Home Management;Energy conservation;Manual techniques;Vestibular    PT Next Visit Plan check remainder of LTGs and perform re-cert (pt is already scheduled out for an additional 2x week for 4 weeks). review HEP and add strengthening (and possibly re-assess hip strength too). add LTG for walking in garden/performing tasks over unlevel surfaces    Consulted and  Agree with Plan of Care Patient           Patient will benefit from skilled therapeutic intervention in order to improve the following deficits and impairments:  Abnormal gait,Decreased coordination,Difficulty walking,Decreased endurance,Decreased activity tolerance,Impaired perceived functional ability,Decreased balance,Decreased mobility,Decreased strength,Improper body mechanics  Visit Diagnosis: Other lack of coordination  Hemiplegia and hemiparesis following cerebral infarction affecting right dominant side ([facility])  Other abnormalities of gait and mobility  Muscle weakness (generalized)     Problem List Patient Active Problem List   Diagnosis Date Noted  .  Stroke ([place]) 05/16/2020  . Acute focal neurological deficit 05/15/2020  . BPH (benign prostatic hyperplasia) 02/02/2016  . Fractured atrial pacemaker lead wire 07/18/2015  . 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 Chest pain 08/02/2013  . SOB (shortness of breath) 08/02/2013  . PVC (premature ventricular contraction) 02/13/2013  . Chest pain on exertion 02/25/2012  . Pacemaker-dual-chamber Medtronic 04/29/2011  . Change in bowel habits 01/22/2011  . Weight loss 01/22/2011  . COUGH 09/01/2010  . Multiple sclerosis ([place])   . HOCM (hypertrophic obstructive cardiomyopathy) ([place])   . Right bundle branch block   . Complete heart block ([place]) 12/05/2007    Arliss Journey, PT, DPT 08/20/2020, 1:36 PM  [facility] [address] [place], [place], [address] Phone: [phone]   Fax:  [phone]  Name: Rodney Adkins MRN: [id number] Date of Birth: 06-08-1956

## 2020-08-20 NOTE — Therapy (Signed)
[facility] [address] [place], [place], [address] Phone: [phone]   Fax:  [phone]  Occupational Therapy Treatment  Patient Details  Name: Rodney Adkins MRN: [id number] Date of Birth: March 13, 1956 Referring Provider (OT): Darliss Cheney, MD   Encounter Date: 08/20/2020   OT End of Session - 08/20/20 1104    Visit Number 20    Number of Visits 24    Date for OT Re-Evaluation 08/27/20   at renewal   Authorization Type State BCBS    Authorization Time Period VL:MN Week 4 of 4 at renewal (2/15)    OT Start Time 1103    OT Stop Time 1145    OT Time Calculation (min) 42 min    Activity Tolerance Patient tolerated treatment well    Behavior During Therapy [facility] for tasks assessed/performed           Past Medical History:  Diagnosis Date  . Complete heart block [facility]) June 2009   a. Presented with hypotension/pulm edema 12/2007 when HOCM was also discovered. s/p dual chamber MDT pacemaker. Thallium stest 7/09 negative for infiltrative disease or ischemia.  Followed Dr [name]  Marland Kitchen Heart murmur   . HOCM (hypertrophic obstructive cardiomyopathy) ([place]) 2009   Discovered by ECHO when he developed CHB. Mgmt Dr Caryl Comes  and Dr [name] at [facility]. Has outflow obstruction on ECHO 2011  . Multiple sclerosis ([place]) 1998   Sxs started 1998 with L body numbness. MRI c/w MS. Started Avonex. Saw Dr [name] at [facility] and switched to Betaseron and was on for 5 yrs but developed depression and site rxns and D/C'd 2006. While on Betaseron, occassionally required solumedrol for flares.  . Pacemaker 2017  . Prostate enlargement   . Right bundle branch block   . SOB (shortness of breath)    a. PFTs 09/2010 essentially normal per pulm note (mild obst defect). b. 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  . Wenckebach    a. Decreased ex tolerance 2010/2012 - underwent stress echo to look @ effects of MV/LVOT - nothing noted; although pt had high  rate Wenckebach behavior with associated ex intol. Pacer reprogrammed with improvement in sx.    Past Surgical History:  Procedure Laterality Date  . LEFT HEART CATHETERIZATION WITH CORONARY ANGIOGRAM N/A 08/22/2013   Procedure: LEFT HEART CATHETERIZATION WITH CORONARY ANGIOGRAM;  Surgeon: [name], MD;  Location: [facility] CATH LAB;  Service: Cardiovascular;  Laterality: N/A;  . LEG TENDON SURGERY  2008   Os peroneus resection and peroneal tendon repair  . pace Agricultural engineer    . PACEMAKER INSERTION  12/2007   Complete heart block  . PACEMAKER LEAD REMOVAL Left 07/18/2015   Procedure: ATRIAL PACEMAKER LEAD EXTRACTION; ATRIAL PACEMAKER LEAD INSERTION; GENERATOR REPLACEMENT.;  Surgeon: [name], MD;  Location: [place];  Service: Cardiovascular;  Laterality: Left;  [name] to back up  . PERIPHERAL VASCULAR CATHETERIZATION N/A 02/12/2015   Procedure: Venogram;  Surgeon: [name], MD;  Location: [facility] CV LAB;  Service: Cardiovascular;  Laterality: N/A;  . TEE WITHOUT CARDIOVERSION N/A 07/18/2015   Procedure: TRANSESOPHAGEAL ECHOCARDIOGRAM (TEE);  Surgeon: [name], MD;  [facility];  Service: Cardiovascular;  Laterality: N/A;    There were no vitals filed for this visit.   Subjective Assessment - 08/20/20 1104    Subjective  Pt denies pain today.    Patient is accompanied by: Family member    Pertinent History Fall Risk. Speech Deficits.  Limitations MS, CHB s/p PPM, HOCM    Patient Stated Goals right hand. 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 Return to driving recommendations.    Currently in Pain? No/denies                        OT Treatments/Exercises (OP) - 08/20/20 1110      ADLs   Functional Mobility working on amublating with holding plate with rolling items in RUE. Pt with no drops but with evident fatigue with RUE by end of lap around gym.    Leisure pt reports working in the yard this ewekend. pt  reports difficulty with carrying items d/t rue fatigue and weakness      Hand Exercises   Other Hand Exercises Hand Gripper with level 2 and picking up 1 inch blocks with RUE      Fine Motor Coordination (Hand/Wrist)   Fine Motor Coordination O'Connor pegs    Manipulation of small objects rotating tiny dice between numbers 1-6 in RUE with min difficulty. rotating 2 golf balls in RUE with increased coordination. Pt with greater ease with going clockwise > counterclockwise    O'Connor pegs with holding tiny die in RUE with 4th and 5th digit while placing O'connor pegs with minimal diff and drops. removed with tweezers. tried placing pegs with tweezers (without holding die) with max difficulty and drops. Pt required freq breaks for fatigue in RUE. Pt removed without tweezers with RUE with no difficulty                    OT Short Term Goals - 07/09/20 1108      OT SHORT TERM GOAL #1   Title Pt will be independent with HEP 06/21/20    Time 4    Period Weeks    Status Achieved    Target Date 06/21/20      OT SHORT TERM GOAL #2   Title Pt will demonstrate improved fine motor coordination by demonstrating decrease in 9 hole peg test score by 6 seconds with RUE.    Baseline RUE 71.06    Time 4    Period Weeks    Status Achieved      OT SHORT TERM GOAL #3   Title Pt will perform simple warm meal prep or home management task with supervision and good safety awareness.    Time 4    Period Weeks    Status Achieved      OT SHORT TERM GOAL #4   Title Pt will write 2-3 sentences with 75% legibility and increased size of letters (mild micrographia).    Baseline severe micrographia    Time 4    Period Weeks    Status Achieved   3 sentences with good letter size and legibility     OT SHORT TERM GOAL #5   Title Pt will demonstrate increase in grip strength by 5 lbs in RUE for increased ease with ADLs and clothing management, etc.    Baseline RUE 45.4 (LUE 65.6)    Time 4    Period  Weeks    Status Achieved   RUE 55.7 lbs     OT SHORT TERM GOAL #6   Title Pt will report using RUE (dominant hand) for 25% or more of functional tasks.    Time 4    Period Weeks    Status Achieved  OT Long Term Goals - 08/06/20 1200      OT LONG TERM GOAL #1   Title *Pt will be independent with updated HEP 08/27/20    Time 4    Period Weeks    Status On-going      OT LONG TERM GOAL #2   Title *Pt will demonstrate improved fine motor coordination by demonstrating decrease in 9 hole peg test score to 26 seconds or less with RUE.    Baseline at renewal RUE 31.75 seconds    Time 8    Period Weeks    Status Revised      OT LONG TERM GOAL #3   Title *Pt will perform work simulated tasks (typing, using mouse, etc) with RUE as dominant hand with 90% accuracy.    Time 8    Period Weeks    Status On-going      OT LONG TERM GOAL #4   Title Pt will report completing at least 1 home management task per day with mod I in order to decrease caregiver burden.    Time 8    Period Weeks    Status Achieved      OT LONG TERM GOAL #5   Title Pt will improve grip strength by at least 10 lbs in RUE for strengthening dominant hand for functional use and tasks.    Baseline RUE 45.4 (LUE 65.6)    Time 8    Period Weeks    Status Achieved   67 RUE     OT LONG TERM GOAL #6   Title *Pt will report using RUE (dominant hand) for at least 75% of functional tasks per day.    Time 8    Period Weeks    Status Partially Met   report fatigue     OT LONG TERM GOAL #7   Title Pt will perform physical and cognitive tasks simultaneously with 95% accuracy for returning to prior tasks.    Time 8    Period Weeks    Status Achieved   100%     OT LONG TERM GOAL #8   Title *Pt will write 3-5 sentences with 90% legibility and appropriate sizing.    Time 8    Period Weeks    Status On-going   pt has made much improvement but continues to get smaller by end of sentence/paragraph                 Plan - 08/20/20 1137    Clinical Impression Statement Pt demonstrating greater ease with fine motor coordination but continues to experience fatigue in RUE    OT Occupational Profile and History Detailed Assessment- Review of Records and additional review of physical, cognitive, psychosocial history related to current functional performance    Occupational performance deficits (Please refer to evaluation for details): IADL's;ADL's;Social Participation;Leisure;Work;Rest and Sleep    Body Structure / Function / Physical Skills ADL;Dexterity;Decreased knowledge of use of DME;Strength;UE functional use;Endurance;IADL;Coordination;FMC    Rehab Potential Good    Clinical Decision Making Limited treatment options, no task modification necessary    Comorbidities Affecting Occupational Performance: None    Modification or Assistance to Complete Evaluation  No modification of tasks or assist necessary to complete eval    OT Frequency 2x / week    OT Duration 4 weeks   or 8 visits from renewal 07/30/20   OT Treatment/Interventions Self-care/ADL training;DME and/or AE instruction;Gait Training;Therapeutic activities;Balance training;Cognitive remediation/compensation;Therapeutic exercise;Neuromuscular education;Passive range of motion;Visual/perceptual remediation/compensation;Patient/family education;Energy conservation  Plan mouse accuracy, handwriting, renew next week (2/17)    OT Home Exercise Plan coordination HEP    Consulted and Agree with Plan of Care Patient;Family member/caregiver   spouse   Family Member Consulted spouse           Patient will benefit from skilled therapeutic intervention in order to improve the following deficits and impairments:   Body Structure / Function / Physical Skills: ADL,Dexterity,Decreased knowledge of use of DME,Strength,UE functional use,Endurance,IADL,Coordination,FMC       Visit Diagnosis: Other lack of coordination  Hemiplegia and  hemiparesis following cerebral infarction affecting right dominant side (HCC)  Other abnormalities of gait and mobility  Muscle weakness (generalized)  Attention and concentration deficit    Problem List Patient Active Problem List   Diagnosis Date Noted  . Stroke ([place]) 05/16/2020  . Acute focal neurological deficit 05/15/2020  . BPH (benign prostatic hyperplasia) 02/02/2016  . Fractured atrial pacemaker lead wire 07/18/2015  . 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 Chest pain 08/02/2013  . SOB (shortness of breath) 08/02/2013  . PVC (premature ventricular contraction) 02/13/2013  . Chest pain on exertion 02/25/2012  . Pacemaker-dual-chamber Medtronic 04/29/2011  . Change in bowel habits 01/22/2011  . Weight loss 01/22/2011  . COUGH 09/01/2010  . Multiple sclerosis ([place])   . HOCM (hypertrophic obstructive cardiomyopathy) ([place])   . Right bundle branch block   . Complete heart block ([place]) 12/05/2007    Zachery Conch MOT, OTR/L  08/20/2020, 11:42 AM  [facility] [address] [place], [place], [address] Phone: [phone]   Fax:  [phone]  Name: Rodney Adkins MRN: [id number] Date of Birth: 06-18-1956

## 2020-08-23 ENCOUNTER — Ambulatory Visit: Payer: BC Managed Care – PPO | Admitting: Occupational Therapy

## 2020-08-23 ENCOUNTER — Ambulatory Visit: Payer: BC Managed Care – PPO

## 2020-08-23 ENCOUNTER — Other Ambulatory Visit: Payer: Self-pay

## 2020-08-23 ENCOUNTER — Encounter: Payer: Self-pay | Admitting: Occupational Therapy

## 2020-08-23 DIAGNOSIS — R278 Other lack of coordination: Secondary | ICD-10-CM

## 2020-08-23 DIAGNOSIS — R471 Dysarthria and anarthria: Secondary | ICD-10-CM | POA: Diagnosis not present

## 2020-08-23 DIAGNOSIS — I69351 Hemiplegia and hemiparesis following cerebral infarction affecting right dominant side: Secondary | ICD-10-CM

## 2020-08-23 DIAGNOSIS — M6281 Muscle weakness (generalized): Secondary | ICD-10-CM

## 2020-08-23 DIAGNOSIS — R2689 Other abnormalities of gait and mobility: Secondary | ICD-10-CM

## 2020-08-23 NOTE — Therapy (Signed)
[facility] [address] [place], [place], [address] Phone: [phone]   Fax:  [phone]  Occupational Therapy Treatment & Recertification  Patient Details  Name: Rodney Adkins MRN: [id number] Date of Birth: [birth year] Referring Provider (OT): Darliss Cheney, MD   Encounter Date: 08/23/2020   OT End of Session - 08/23/20 1103    Visit Number 21    Number of Visits 30   updated at renewal   Date for OT Re-Evaluation 09/27/20   at renewal   [place] Time Period VL:MN renewal 08/23/2020    OT Start Time 1100    OT Stop Time 1145    OT Time Calculation (min) 45 min    Activity Tolerance Patient tolerated treatment well    Behavior During Therapy [facility] for tasks assessed/performed           Past Medical History:  Diagnosis Date  . Complete heart block [facility]) June 2009   a. Presented with hypotension/pulm edema 12/2007 when HOCM was also discovered. s/p dual chamber MDT pacemaker. Thallium stest 7/09 negative for infiltrative disease or ischemia.  Followed Dr [name]  Marland Kitchen Heart murmur   . HOCM (hypertrophic obstructive cardiomyopathy) ([place]) 2009   Discovered by ECHO when he developed CHB. Mgmt Dr Caryl Comes  and Dr [name] at [facility]. Has outflow obstruction on ECHO 2011  . Multiple sclerosis ([place]) 1998   Sxs started 1998 with L body numbness. MRI c/w MS. Started Avonex. Saw Dr [name] at [facility] and switched to Betaseron and was on for 5 yrs but developed depression and site rxns and D/C'd 2006. While on Betaseron, occassionally required solumedrol for flares.  . Pacemaker 2017  . Prostate enlargement   . Right bundle branch block   . SOB (shortness of breath)    a. PFTs 09/2010 essentially normal per pulm note (mild obst defect). b. 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  . Wenckebach    a. Decreased ex tolerance 2010/2012 - underwent stress echo to look @ effects of MV/LVOT - nothing  noted; although pt had high rate Wenckebach behavior with associated ex intol. Pacer reprogrammed with improvement in sx.    Past Surgical History:  Procedure Laterality Date  . LEFT HEART CATHETERIZATION WITH CORONARY ANGIOGRAM N/A 08/22/2013   Procedure: LEFT HEART CATHETERIZATION WITH CORONARY ANGIOGRAM;  Surgeon: [name], MD;  Location: [facility] CATH LAB;  Service: Cardiovascular;  Laterality: N/A;  . LEG TENDON SURGERY  2008   Os peroneus resection and peroneal tendon repair  . pace Agricultural engineer    . PACEMAKER INSERTION  12/2007   Complete heart block  . PACEMAKER LEAD REMOVAL Left 07/18/2015   Procedure: ATRIAL PACEMAKER LEAD EXTRACTION; ATRIAL PACEMAKER LEAD INSERTION; GENERATOR REPLACEMENT.;  Surgeon: [name], MD;  Location: [facility];  Service: Cardiovascular;  Laterality: Left;  [name] to back up  . PERIPHERAL VASCULAR CATHETERIZATION N/A 02/12/2015   Procedure: Venogram;  Surgeon: [name], MD;  Location: [place] CV LAB;  Service: Cardiovascular;  Laterality: N/A;  . TEE WITHOUT CARDIOVERSION N/A 07/18/2015   Procedure: TRANSESOPHAGEAL ECHOCARDIOGRAM (TEE);  Surgeon: [name], MD;  Location: [facility] OR;  Service: Cardiovascular;  Laterality: N/A;    There were no vitals filed for this visit.   Subjective Assessment - 08/23/20 1127    Subjective  Pt denies any pain today.    Patient is accompanied by: Family member    Pertinent History Fall Risk.  Speech Deficits.    Limitations MS, CHB s/p PPM, HOCM    Patient Stated Goals right hand. 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 Return to driving recommendations.    Currently in Pain? No/denies                        OT Treatments/Exercises (OP) - 08/23/20 1136      Functional Reaching Activities   High Level reaching to antenna with RUE and resistance clothespins 1-8# with good shoulder movement and tolerance - no reports of pain or fatigue. Pt completed in  standing      Fine Motor Coordination (Hand/Wrist)   Fine Motor Coordination In hand manipuation training    In Hand Manipulation Training in hand manipulation with small beads in RUE with min drops and min difficulty                    OT Short Term Goals - 08/23/20 1104      OT SHORT TERM GOAL #1   Title Pt will be independent with HEP 06/21/20    Time 4    Period Weeks    Status Achieved    Target Date 06/21/20      OT SHORT TERM GOAL #2   Title Pt will demonstrate improved fine motor coordination by demonstrating decrease in 9 hole peg test score by 6 seconds with RUE.    Baseline RUE 71.06    Time 4    Period Weeks    Status Achieved      OT SHORT TERM GOAL #3   Title Pt will perform simple warm meal prep or home management task with supervision and good safety awareness.    Time 4    Period Weeks    Status Achieved      OT SHORT TERM GOAL #4   Title Pt will write 2-3 sentences with 75% legibility and increased size of letters (mild micrographia).    Baseline severe micrographia    Time 4    Period Weeks    Status Achieved   3 sentences with good letter size and legibility     OT SHORT TERM GOAL #5   Title Pt will demonstrate increase in grip strength by 5 lbs in RUE for increased ease with ADLs and clothing management, etc.    Baseline RUE 45.4 (LUE 65.6)    Time 4    Period Weeks    Status Achieved   RUE 55.7 lbs     OT SHORT TERM GOAL #6   Title Pt will report using RUE (dominant hand) for 25% or more of functional tasks.    Time 4    Period Weeks    Status Achieved             OT Long Term Goals - 08/23/20 1104      OT LONG TERM GOAL #1   Title Pt will be independent with updated HEP 08/27/20    Time 4    Period Weeks    Status Achieved      OT LONG TERM GOAL #2   Title *Pt will demonstrate improved fine motor coordination by demonstrating decrease in 9 hole peg test score to 26 seconds or less with RUE.    Baseline at renewal RUE  30.94 seconds    Time 4    Period Weeks    Status On-going   08/23/2020 renewal - 30.94  OT LONG TERM GOAL #3   Title *Pt will perform work simulated tasks (typing, using mouse, etc) with RUE as dominant hand with 90% accuracy.    Time 4    Period Weeks    Status On-going   pt reports approx 70% to baseline.     OT LONG TERM GOAL #4   Title Pt will report completing at least 1 home management task per day with mod I in order to decrease caregiver burden.    Time 8    Period Weeks    Status Achieved      OT LONG TERM GOAL #5   Title Pt will improve grip strength by at least 10 lbs in RUE for strengthening dominant hand for functional use and tasks.    Baseline RUE 45.4 (LUE 65.6)    Time 8    Period Weeks    Status Achieved   61 RUE at renewal 08/23/20     OT LONG TERM GOAL #6   Title Pt will report using RUE (dominant hand) for at least 75% of functional tasks per day.    Time 8    Period Weeks    Status Achieved      OT LONG TERM GOAL #7   Title Pt will perform physical and cognitive tasks simultaneously with 95% accuracy for returning to prior tasks.    Time 8    Period Weeks    Status Achieved   100%     OT LONG TERM GOAL #8   Title Pt will write 3-5 sentences with 90% legibility and appropriate sizing.    Time 8    Period Weeks    Status Achieved   still has fatigue but much improved and takes increased time.     OT LONG TERM GOAL  #9   TITLE Pt will be independent with upgraded HEP for targeting proximal strength in RUE.    Time 4    Period Weeks    Status New      OT LONG TERM GOAL  #10   TITLE *Pt will improve grip strength by at least 5 lbs in RUE for strengthening dominant hand for functional use and tasks.    Baseline renewal 61 lbs RUE    Time 4    Period Weeks    Status New                 Plan - 08/23/20 1112    Clinical Impression Statement Pt is due for renewal at this time. Pt has met all STGs and has met 5/8 LTGs. Upgraded grip  strength goal and added goal to target proximal strength in RUE with HEP and continuing to work on fine motor coordination with RUE. Pt has 2 on-going goals and 2 new goals. Pt continues to benefit from skilled OT to target goals.    OT Occupational Profile and History Detailed Assessment- Review of Records and additional review of physical, cognitive, psychosocial history related to current functional performance    Occupational performance deficits (Please refer to evaluation for details): IADL's;ADL's;Social Participation;Leisure;Work;Rest and Sleep    Body Structure / Function / Physical Skills ADL;Dexterity;Decreased knowledge of use of DME;Strength;UE functional use;Endurance;IADL;Coordination;FMC    Rehab Potential Good    Clinical Decision Making Limited treatment options, no task modification necessary    Comorbidities Affecting Occupational Performance: None    Modification or Assistance to Complete Evaluation  No modification of tasks or assist necessary to complete eval    OT  Frequency 2x / week    OT Duration 4 weeks   or 8 visits from renewal 07/30/20   OT Treatment/Interventions Self-care/ADL training;DME and/or AE instruction;Gait Training;Therapeutic activities;Balance training;Cognitive remediation/compensation;Therapeutic exercise;Neuromuscular education;Passive range of motion;Visual/perceptual remediation/compensation;Patient/family education;Energy conservation    Plan coordination, proximal strength, upgrade HEP for proximal strength    OT Home Exercise Plan coordination HEP    Consulted and Agree with Plan of Care Patient;Family member/caregiver   spouse   Family Member Consulted spouse           Patient will benefit from skilled therapeutic intervention in order to improve the following deficits and impairments:   Body Structure / Function / Physical Skills: ADL,Dexterity,Decreased knowledge of use of DME,Strength,UE functional use,Endurance,IADL,Coordination,FMC        Visit Diagnosis: Other lack of coordination  Hemiplegia and hemiparesis following cerebral infarction affecting right dominant side (HCC)  Muscle weakness (generalized)    Problem List Patient Active Problem List   Diagnosis Date Noted  . Stroke ([place]) 05/16/2020  . Acute focal neurological deficit 05/15/2020  . BPH (benign prostatic hyperplasia) 02/02/2016  . Fractured atrial pacemaker lead wire 07/18/2015  . 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 Chest pain 08/02/2013  . SOB (shortness of breath) 08/02/2013  . PVC (premature ventricular contraction) 02/13/2013  . Chest pain on exertion 02/25/2012  . Pacemaker-dual-chamber Medtronic 04/29/2011  . Change in bowel habits 01/22/2011  . Weight loss 01/22/2011  . COUGH 09/01/2010  . Multiple sclerosis ([place])   . HOCM (hypertrophic obstructive cardiomyopathy) ([place])   . Right bundle branch block   . Complete heart block ([place]) 12/05/2007    Zachery Conch MOT, OTR/L  08/23/2020, 11:40 AM  [facility] [address] [place], [place], [address] Phone: [phone]   Fax:  [phone]  Name: Rodney Adkins MRN: [id number] Date of Birth: [birth year]

## 2020-08-23 NOTE — Therapy (Signed)
[facility] [address] [place], [place], [address] Phone: [phone]   Fax:  [phone]  Physical Therapy Treatment/Recert  Patient Details  Name: Rodney Adkins MRN: [id number] Date of Birth: January 24, 1956 Referring Provider (PT): Otto Herb, MD   Encounter Date: 08/23/2020   PT End of Session - 08/23/20 1020    Visit Number 25    Number of Visits 33    Authorization Type BCBS PPO - FOTO patient    PT Start Time 0947    PT Stop Time 1058    PT Time Calculation (min) 40 min    Equipment Utilized During Treatment Gait belt    Activity Tolerance Patient tolerated treatment well    Behavior During Therapy WFL for tasks assessed/performed           Past Medical History:  Diagnosis Date  . Complete heart block [facility]) June 2009   a. Presented with hypotension/pulm edema 12/2007 when HOCM was also discovered. s/p dual chamber MDT pacemaker. Thallium stest 7/09 negative for infiltrative disease or ischemia.  Followed Dr [name]  Marland Kitchen Heart murmur   . HOCM (hypertrophic obstructive cardiomyopathy) ([place]) 2009   Discovered by ECHO when he developed CHB. Mgmt Dr Caryl Comes  and Dr [name] at [facility]. Has outflow obstruction on ECHO 2011  . Multiple sclerosis ([place]) 1998   Sxs started 1998 with L body numbness. MRI c/w MS. Started Avonex. Saw Dr [name] at [facility] and switched to Betaseron and was on for 5 yrs but developed depression and site rxns and D/C'd 2006. While on Betaseron, occassionally required solumedrol for flares.  . Pacemaker 2017  . Prostate enlargement   . Right bundle branch block   . SOB (shortness of breath)    a. PFTs 09/2010 essentially normal per pulm note (mild obst defect). b. 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  . Wenckebach    a. Decreased ex tolerance 2010/2012 - underwent stress echo to look @ effects of MV/LVOT - nothing noted; although pt had high rate Wenckebach behavior with associated ex  intol. Pacer reprogrammed with improvement in sx.    Past Surgical History:  Procedure Laterality Date  . LEFT HEART CATHETERIZATION WITH CORONARY ANGIOGRAM N/A 08/22/2013   Procedure: LEFT HEART CATHETERIZATION WITH CORONARY ANGIOGRAM;  Surgeon: [name], MD;  Location: [facility] CATH LAB;  Service: Cardiovascular;  Laterality: N/A;  . LEG TENDON SURGERY  2008   Os peroneus resection and peroneal tendon repair  . pace Agricultural engineer    . PACEMAKER INSERTION  12/2007   Complete heart block  . PACEMAKER LEAD REMOVAL Left 07/18/2015   Procedure: ATRIAL PACEMAKER LEAD EXTRACTION; ATRIAL PACEMAKER LEAD INSERTION; GENERATOR REPLACEMENT.;  Surgeon: [name], MD;  Location: [place];  Service: Cardiovascular;  Laterality: Left;  [name] to back up  . PERIPHERAL VASCULAR CATHETERIZATION N/A 02/12/2015   Procedure: Venogram;  Surgeon: [name], MD;  Location: [place] CV LAB;  Service: Cardiovascular;  Laterality: N/A;  . TEE WITHOUT CARDIOVERSION N/A 07/18/2015   Procedure: TRANSESOPHAGEAL ECHOCARDIOGRAM (TEE);  Surgeon: [name], MD;  Location: [facility] OR;  Service: Cardiovascular;  Laterality: N/A;    There were no vitals filed for this visit.   Subjective Assessment - 08/23/20 1020    Subjective Pt reports that he is doing ok. Slow progress but in the right direction. Is finding that he has been getting some pain in right posterior heel at night. No redness or tenderness to palpation.  Pertinent History MS, CHB s/p PPM, HOCM    Limitations Walking;Writing;Standing;Lifting;House hold activities    Patient Stated Goals wants to be able to walk better    Currently in Pain? Yes    Pain Score 0-No pain    Pain Location Heel    Pain Orientation Right;Posterior    Pain Onset More than a month ago    Pain Frequency Intermittent    Aggravating Factors  feels most of time when he is sleeping.                             [place] Adult PT Treatment/Exercise - 08/23/20 1026       Transfers   Transfers Sit to Stand;Stand to Sit    Sit to Stand 7: Independent    Stand to Sit 7: Independent    Comments 30 sec sit to stand=10 from chair without hands.      Ambulation/Gait   Ambulation/Gait Yes    Ambulation/Gait Assistance 5: Supervision    Ambulation/Gait Assistance Details Pt was cued to try to relax right arm for more arm swing as flexor tone kicks in. Also to work on increased right hip flexion. Pt had 2 bouts of catching right foot but able to regain balance remaining supervision.    Ambulation Distance (Feet) 850 Feet    Assistive device --   walking stick outside   Gait Pattern Step-through pattern;Decreased hip/knee flexion - right;Decreased dorsiflexion - right;Decreased stance time - right    Ambulation Surface Unlevel;Level;Outdoor;Paved;Grass    Gait velocity 10.38 sec= 0.55ms with walking stick and 11.85 sec=0.872m without AD      Exercises   Exercises Other Exercises    Other Exercises  Verbally reviewed HEP listed below. Performed tapping 2nd step with RLE x 10 with verbal cues to prevent circumduction then repeated with bringing back in to hip extension on right with return down x 10. Advised pt not to add resistance at this point as still fatigues with trying to perform correct form. If gets easier instructed to increase reps instead.            Exercises- verbally reviewed HEP below Mini Squat with Counter Support - 2 x daily - 5 x weekly - 2 sets - 10 reps Standing Marching - 2 x daily - 5 x weekly - 3 sets Tandem Stance - 2 x daily - 5 x weekly - 1 sets - 3 reps - 20 sec hold Alternating tapping cup at counter - 1 x daily - 5 x weekly - 2 sets - 10 reps Standing Knee Flexion AROM with Chair Support - 2 x daily - 5 x weekly - 1 sets - 10 reps Prone Knee Flexion - 1 x daily - 5 x weekly - 2 sets - 10 reps Standing Balance with Eyes Closed on Foam - 1 x daily - 5 x weekly - 3 sets - 30 hold       PT Education - 08/23/20 1132     Education Details Discussed results of testing. Reviewed HEP and instructed to not add resistance to exercises yet instead increase reps if finds easier to not sacrifice quality of movement. Discussed recert plan.    Person(s) Educated Patient    Methods Explanation    Comprehension Verbalized understanding            PT Short Term Goals - 08/06/20 1028      PT SHORT TERM GOAL #1  Title Pt will improve DGI score to at least a 16/24 in order to demo decr fall risk. ALL STGS DUE 08/02/20    Baseline 13/24, 20/24    Time 3    Period Weeks    Status Achieved    Target Date 08/02/20      PT SHORT TERM GOAL #2   Title Pt will decrease 5xSTS to </= 15 seconds for decreased fall risk.    Baseline 07/09/20 16.62 sec. 11.71 sec from chair without hands    Time 3    Period Weeks    Status Achieved      PT SHORT TERM GOAL #3   Title Pt will increase gait speed to >/= 0.8 m/s with single walking stick for increased access to community and decreased fall risk.    Baseline .69 m/s with walking stick, 11.93 seconds = .83 ft/sec no AD on 08/06/20    Time 3    Period Weeks    Status Achieved      PT SHORT TERM GOAL #4   Title Pt will decr TUG to 13 seconds or less with no AD in order to demo decr fall risk.    Baseline 14.19 seconds with no AD, 10.19 seconds no AD    Time 3    Period Weeks    Status Achieved             PT Long Term Goals - 08/23/20 1136      PT LONG TERM GOAL #1   Title Pt will be independent with progressive HEP. ALL LTGS DUE 08/23/20    Baseline added exercises with ankle weights to HEP, pt will benefit from on-going additions    Time 6    Period Weeks    Status On-going      PT LONG TERM GOAL #2   Title Pt will decrease 5xSTS to </= 12 seconds to no longer be classified as fall risk and to be WNL for age and gender.    Baseline 07/09/20 16.62 sec, 11.4 seconds on 08/20/20    Time 6    Period Weeks    Status Achieved      PT LONG TERM GOAL #3   Title Pt will  improve score on DGI to at least a 19/24 in order to demo decr fall risk.    Baseline 13/24, 21/24 on 08/20/20    Time 6    Period Weeks    Status Achieved      PT LONG TERM GOAL #4   Title Pt will reach .90 m/s gait speed for community ambulation and decreased fall risk with LRAD vs. no AD    Baseline 0.65 m/s. 06/19/20 0.32ms with walker and 0.736m with cane, .69 m/s with walking stick. 08/23/20 0.9639mwith walking stick, 0.48m16mithout    Time 6    Period Weeks    Status Achieved      PT LONG TERM GOAL #5   Title Pt will ambulated 800+ feet on outdoor/unlevel surfaces with supervision and LRAD vs. no AD.    Baseline NT, 400' with supervision and walking stick (limited distance due to cold weather conditions). 08/23/20 850' with walking stick outside supervision    Time 6    Period Weeks    Status Achieved      PT LONG TERM GOAL #6   Title Pt will increase FOTO from 46 to >/= 70 for increased confidence in abilities.    Baseline 46 05/21/20, 52 on 08/20/20  Time 6    Period Weeks    Status Not Met            Updated goals:  PT Short Term Goals - 08/23/20 1154      PT SHORT TERM GOAL #1   Title STGs=LTGs           PT Long Term Goals - 08/23/20 1150      PT LONG TERM GOAL #1   Title Pt will be independent with progressive HEP. ALL LTGS DUE 08/23/20    Baseline added exercises with ankle weights to HEP, pt will benefit from on-going additions    Time 4    Period Weeks    Status On-going    Target Date 09/22/20      PT LONG TERM GOAL #2   Title Pt will increase 30 sec sit to stand from 10 to 12 or more for improved functional strength.    Baseline 08/23/20 10 from chair without hands    Time 4    Period Weeks    Status New    Target Date 09/22/20      PT LONG TERM GOAL #3   Title Pt will increase FGA from 22/30 to >24/30 for improved balance and gait safety.    Baseline 08/20/20 22/30    Time 4    Period Weeks    Status New    Target Date 09/22/20       PT LONG TERM GOAL #4   Title Pt will ambulate on outdoor/nonlevel surfaces with walking stick versus no AD mod I for improved safety with community mobility and walking in garden.    Time 4    Period Weeks    Status New    Target Date 09/22/20               Plan - 08/23/20 1140    Clinical Impression Statement PT assessed remaining goals for recert today. Pt continues to show progress. He met gait speed goal with walking stick ambulating at 0.64ms. Without AD speed was 0.825m. These both indicate increased safety with community ambulation. Pt was able to increase gait distance outside on varied surfaces but does still need use of walking stick at supervision level. His FGA score from last session indicates moderate fall risk. He had improved on DGI indicating his balance is improving.  Pt will benefit from continued PT to continue to work on balance, strength and gait in order to maximize independence with mobility and further decrease fall risk.    Personal Factors and Comorbidities Comorbidity 3+;Age    Comorbidities MS, CHB s/p PPM, HOCM    Examination-Activity Limitations Squat;Locomotion Level;Transfers;Stairs;Stand    Examination-Participation Restrictions Yard Work;Occupation;Driving;Community Activity    Stability/Clinical Decision Making Evolving/Moderate complexity    Rehab Potential Good    PT Frequency 2x / week    PT Duration 4 weeks    PT Treatment/Interventions Gait training;Stair training;Functional mobility training;Therapeutic activities;Therapeutic exercise;Balance training;Neuromuscular re-education;Patient/family education;Orthotic Fit/Training;DME Instruction;ADLs/Self Care Home Management;Energy conservation;Manual techniques;Vestibular    PT Next Visit Plan Continue with dynamic gait activities with and without walking stick on varied surfaces, high level balance and right hip strengthening.    Consulted and Agree with Plan of Care Patient           Patient  will benefit from skilled therapeutic intervention in order to improve the following deficits and impairments:  Abnormal gait,Decreased coordination,Difficulty walking,Decreased endurance,Decreased activity tolerance,Impaired perceived functional ability,Decreased balance,Decreased mobility,Decreased strength,Improper body mechanics  Visit Diagnosis:  Other abnormalities of gait and mobility  Muscle weakness (generalized)  Hemiplegia and hemiparesis following cerebral infarction affecting right dominant side [facility])     Problem List Patient Active Problem List   Diagnosis Date Noted  . Stroke ([place]) 05/16/2020  . Acute focal neurological deficit 05/15/2020  . BPH (benign prostatic hyperplasia) 02/02/2016  . Fractured atrial pacemaker lead wire 07/18/2015  . 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 Chest pain 08/02/2013  . SOB (shortness of breath) 08/02/2013  . PVC (premature ventricular contraction) 02/13/2013  . Chest pain on exertion 02/25/2012  . Pacemaker-dual-chamber Medtronic 04/29/2011  . Change in bowel habits 01/22/2011  . Weight loss 01/22/2011  . COUGH 09/01/2010  . Multiple sclerosis ([place])   . HOCM (hypertrophic obstructive cardiomyopathy) ([place])   . Right bundle branch block   . Complete heart block ([place]) 12/05/2007    Electa Sniff, PT, DPT, NCS 08/23/2020, 11:47 AM  [facility] [address] [place], [place], [address] Phone: [phone]   Fax:  [phone]  Name: Rodney Adkins MRN: [id number] Date of Birth: March 02, 1956

## 2020-08-23 NOTE — Patient Instructions (Signed)
Access Code: [email or web address] URL: [email or web address] Date: 08/23/2020 Prepared by: Cherly Anderson  Exercises Mini Squat with Counter Support - 2 x daily - 5 x weekly - 2 sets - 10 reps Standing Marching - 2 x daily - 5 x weekly - 3 sets Tandem Stance - 2 x daily - 5 x weekly - 1 sets - 3 reps - 20 sec hold Alternating tapping cup at counter - 1 x daily - 5 x weekly - 2 sets - 10 reps Standing Knee Flexion AROM with Chair Support - 2 x daily - 5 x weekly - 1 sets - 10 reps Prone Knee Flexion - 1 x daily - 5 x weekly - 2 sets - 10 reps Standing Balance with Eyes Closed on Foam - 1 x daily - 5 x weekly - 3 sets - 30 hold

## 2020-08-27 DIAGNOSIS — J383 Other diseases of vocal cords: Secondary | ICD-10-CM | POA: Insufficient documentation

## 2020-08-27 DIAGNOSIS — J385 Laryngeal spasm: Secondary | ICD-10-CM | POA: Insufficient documentation

## 2020-08-27 DIAGNOSIS — R490 Dysphonia: Secondary | ICD-10-CM | POA: Insufficient documentation

## 2020-08-28 ENCOUNTER — Ambulatory Visit: Payer: BC Managed Care – PPO

## 2020-08-28 ENCOUNTER — Other Ambulatory Visit: Payer: Self-pay

## 2020-08-28 DIAGNOSIS — I69351 Hemiplegia and hemiparesis following cerebral infarction affecting right dominant side: Secondary | ICD-10-CM

## 2020-08-28 DIAGNOSIS — R2689 Other abnormalities of gait and mobility: Secondary | ICD-10-CM

## 2020-08-28 DIAGNOSIS — R471 Dysarthria and anarthria: Secondary | ICD-10-CM | POA: Diagnosis not present

## 2020-08-28 DIAGNOSIS — M6281 Muscle weakness (generalized): Secondary | ICD-10-CM

## 2020-08-28 NOTE — Therapy (Signed)
[facility] [address] [place], [place], [address] Phone: [phone]   Fax:  [phone]  Physical Therapy Treatment  Patient Details  Name: CODA MATHEY MRN: [id number] Date of Birth: 06-05-1956 Referring Provider (PT): Otto Herb, MD   Encounter Date: 08/28/2020   PT End of Session - 08/28/20 1020    Visit Number 26    Number of Visits 33    Authorization Type BCBS PPO - FOTO patient    PT Start Time 0998    PT Stop Time 1056    PT Time Calculation (min) 38 min    Equipment Utilized During Treatment Gait belt    Activity Tolerance Patient tolerated treatment well    Behavior During Therapy WFL for tasks assessed/performed           Past Medical History:  Diagnosis Date  . Complete heart block [facility]) June 2009   a. Presented with hypotension/pulm edema 12/2007 when HOCM was also discovered. s/p dual chamber MDT pacemaker. Thallium stest 7/09 negative for infiltrative disease or ischemia.  Followed Dr [name]  Marland Kitchen Heart murmur   . HOCM (hypertrophic obstructive cardiomyopathy) ([place]) 2009   Discovered by ECHO when he developed CHB. Mgmt Dr Caryl Comes  and Dr [name] at [facility]. Has outflow obstruction on ECHO 2011  . Multiple sclerosis ([place]) 1998   Sxs started 1998 with L body numbness. MRI c/w MS. Started Avonex. Saw Dr [name] at [facility] and switched to Betaseron and was on for 5 yrs but developed depression and site rxns and D/C'd 2006. While on Betaseron, occassionally required solumedrol for flares.  . Pacemaker 2017  . Prostate enlargement   . Right bundle branch block   . SOB (shortness of breath)    a. PFTs 09/2010 essentially normal per pulm note (mild obst defect). b. 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  . 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  Pacer reprogrammed with improvement in sx.    Past Surgical History:  Procedure Laterality Date  . LEFT HEART CATHETERIZATION WITH CORONARY ANGIOGRAM N/A 08/22/2013   Procedure: LEFT HEART CATHETERIZATION WITH CORONARY ANGIOGRAM;  Surgeon: [name], MD;  Location: [facility] CATH LAB;  Service: Cardiovascular;  Laterality: N/A;  . LEG TENDON SURGERY  2008   Os peroneus resection and peroneal tendon repair  . pace Agricultural engineer    . PACEMAKER INSERTION  12/2007   Complete heart block  . PACEMAKER LEAD REMOVAL Left 07/18/2015   Procedure: ATRIAL PACEMAKER LEAD EXTRACTION; ATRIAL PACEMAKER LEAD INSERTION; GENERATOR REPLACEMENT.;  Surgeon: [name], MD;  Location: [place];  Service: Cardiovascular;  Laterality: Left;  [name] to back up  . PERIPHERAL VASCULAR CATHETERIZATION N/A 02/12/2015   Procedure: Venogram;  Surgeon: [name], MD;  Location: [place] CV LAB;  Service: Cardiovascular;  Laterality: N/A;  . TEE WITHOUT CARDIOVERSION N/A 07/18/2015   Procedure: TRANSESOPHAGEAL ECHOCARDIOGRAM (TEE);  Surgeon: [name], MD;  Location: [facility] OR;  Service: Cardiovascular;  Laterality: N/A;    There were no vitals filed for this visit.   Subjective Assessment - 08/28/20 1020    Subjective Pt reports no changes.    Pertinent History MS, CHB s/p PPM, HOCM    Limitations Walking;Writing;Standing;Lifting;House hold activities    Patient Stated Goals wants to be able to walk better  Currently in Pain? No/denies    Pain Onset More than a month ago                             [facility] Adult PT Treatment/Exercise - 08/28/20 1021      Ambulation/Gait   Ambulation/Gait Yes    Ambulation/Gait Assistance 5: Supervision    Ambulation/Gait Assistance Details Pt was cued to increase step length. Noted better right foot clearance with improved hip flexion after Sci-Fit.    Ambulation Distance (Feet) 345 Feet    Assistive device None    Gait Pattern Step-through pattern;Decreased  hip/knee flexion - right    Ambulation Surface Level;Indoor      Neuro Re-ed    Neuro Re-ed Details  Pt ambulated over obstacle course: reciprocal steps over 4 low hurdles, stepping over 5 cones with tapping with each foot first then marching over mat x 3 bouts of each. Pt challenged with SLS on RLE on compliant surface. In // bars: step-ups on 4" step with airex on top 10 x 2 with verbal cues to weight shift over right leg and not power up pushing with LLE. Standing on airex alternating toe taps on cone x 10 each leg. In hallway: backwards gait 40' x 2 CGA with less stability with weight shift to right but good hip extension.      Knee/Hip Exercises: Aerobic   Other Aerobic Sci Fit x 6 min level 3 legs only for strengthening and improving activity tolerance.                    PT Short Term Goals - 08/23/20 1154      PT SHORT TERM GOAL #1   Title STGs=LTGs             PT Long Term Goals - 08/23/20 1150      PT LONG TERM GOAL #1   Title Pt will be independent with progressive HEP. ALL LTGS DUE 08/23/20    Baseline added exercises with ankle weights to HEP, pt will benefit from on-going additions    Time 4    Period Weeks    Status On-going    Target Date 09/22/20      PT LONG TERM GOAL #2   Title Pt will increase 30 sec sit to stand from 10 to 12 or more for improved functional strength.    Baseline 08/23/20 10 from chair without hands    Time 4    Period Weeks    Status New    Target Date 09/22/20      PT LONG TERM GOAL #3   Title Pt will increase FGA from 22/30 to >24/30 for improved balance and gait safety.    Baseline 08/20/20 22/30    Time 4    Period Weeks    Status New    Target Date 09/22/20      PT LONG TERM GOAL #4   Title Pt will ambulate on outdoor/nonlevel surfaces with walking stick versus no AD mod I for improved safety with community mobility and walking in garden.    Time 4    Period Weeks    Status New    Target Date 09/22/20                  Plan - 08/28/20 1158    Clinical Impression Statement Pt able to demonstrate better right foot clearance after time on Sci-Fit today. Continued  to work on increasing right SLS time and on compliant surfaces which are challenging to pt.    Personal Factors and Comorbidities Comorbidity 3+;Age    Comorbidities MS, CHB s/p PPM, HOCM    Examination-Activity Limitations Squat;Locomotion Level;Transfers;Stairs;Stand    Examination-Participation Restrictions Yard Work;Occupation;Driving;Community Activity    Stability/Clinical Decision Making Evolving/Moderate complexity    Rehab Potential Good    PT Frequency 2x / week    PT Duration 4 weeks    PT Treatment/Interventions Gait training;Stair training;Functional mobility training;Therapeutic activities;Therapeutic exercise;Balance training;Neuromuscular re-education;Patient/family education;Orthotic Fit/Training;DME Instruction;ADLs/Self Care Home Management;Energy conservation;Manual techniques;Vestibular    PT Next Visit Plan Warm up on SciFit legs only for 6 min level 3. Continue with dynamic gait activities with and without walking stick on varied surfaces, high level balance and right hip strengthening.    Consulted and Agree with Plan of Care Patient           Patient will benefit from skilled therapeutic intervention in order to improve the following deficits and impairments:  Abnormal gait,Decreased coordination,Difficulty walking,Decreased endurance,Decreased activity tolerance,Impaired perceived functional ability,Decreased balance,Decreased mobility,Decreased strength,Improper body mechanics  Visit Diagnosis: Other abnormalities of gait and mobility  Muscle weakness (generalized)  Hemiplegia and hemiparesis following cerebral infarction affecting right dominant side [facility])     Problem List Patient Active Problem List   Diagnosis Date Noted  . Stroke ([place]) 05/16/2020  . Acute focal neurological deficit 05/15/2020   . BPH (benign prostatic hyperplasia) 02/02/2016  . Fractured atrial pacemaker lead wire 07/18/2015  . 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 Chest pain 08/02/2013  . SOB (shortness of breath) 08/02/2013  . PVC (premature ventricular contraction) 02/13/2013  . Chest pain on exertion 02/25/2012  . Pacemaker-dual-chamber Medtronic 04/29/2011  . Change in bowel habits 01/22/2011  . Weight loss 01/22/2011  . COUGH 09/01/2010  . Multiple sclerosis ([place])   . HOCM (hypertrophic obstructive cardiomyopathy) ([place])   . Right bundle branch block   . Complete heart block ([place]) 12/05/2007    Electa Sniff, PT, DPT, NCS 08/28/2020, 12:02 PM  [facility] [address] [place], [place], [address] Phone: [phone]   Fax:  [phone]  Name: ATHA MCBAIN MRN: [id number] Date of Birth: [birth year]

## 2020-08-30 ENCOUNTER — Other Ambulatory Visit: Payer: Self-pay

## 2020-08-30 ENCOUNTER — Ambulatory Visit: Payer: BC Managed Care – PPO

## 2020-08-30 DIAGNOSIS — I69351 Hemiplegia and hemiparesis following cerebral infarction affecting right dominant side: Secondary | ICD-10-CM

## 2020-08-30 DIAGNOSIS — M6281 Muscle weakness (generalized): Secondary | ICD-10-CM

## 2020-08-30 DIAGNOSIS — R2689 Other abnormalities of gait and mobility: Secondary | ICD-10-CM

## 2020-08-30 DIAGNOSIS — R471 Dysarthria and anarthria: Secondary | ICD-10-CM | POA: Diagnosis not present

## 2020-08-30 NOTE — Therapy (Signed)
[facility] [address] [place], [place], [address] Phone: [phone]   Fax:  [phone]  Physical Therapy Treatment  Patient Details  Name: Rodney Adkins MRN: [id number] Date of Birth: 1955/07/17 Referring Provider (PT): Otto Herb, MD   Encounter Date: 08/30/2020   PT End of Session - 08/30/20 1019    Visit Number 27    Number of Visits 33    Authorization Type BCBS PPO - FOTO patient    PT Start Time 8101    PT Stop Time 1058    PT Time Calculation (min) 40 min    Equipment Utilized During Treatment Gait belt    Activity Tolerance Patient tolerated treatment well    Behavior During Therapy WFL for tasks assessed/performed           Past Medical History:  Diagnosis Date  . Complete heart block [facility]) June 2009   a. Presented with hypotension/pulm edema 12/2007 when HOCM was also discovered. s/p dual chamber MDT pacemaker. Thallium stest 7/09 negative for infiltrative disease or ischemia.  Followed Dr [name]  Marland Kitchen Heart murmur   . HOCM (hypertrophic obstructive cardiomyopathy) ([place]) 2009   Discovered by ECHO when he developed CHB. Mgmt Dr Caryl Comes  and Dr [name] at [facility]. Has outflow obstruction on ECHO 2011  . Multiple sclerosis ([place]) 1998   Sxs started 1998 with L body numbness. MRI c/w MS. Started Avonex. Saw Dr [name] at [facility] and switched to Betaseron and was on for 5 yrs but developed depression and site rxns and D/C'd 2006. While on Betaseron, occassionally required solumedrol for flares.  . Pacemaker 2017  . Prostate enlargement   . Right bundle branch block   . SOB (shortness of breath)    a. PFTs 09/2010 essentially normal per pulm note (mild obst defect). b. 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  . 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  Pacer reprogrammed with improvement in sx.    Past Surgical History:  Procedure Laterality Date  . LEFT HEART CATHETERIZATION WITH CORONARY ANGIOGRAM N/A 08/22/2013   Procedure: LEFT HEART CATHETERIZATION WITH CORONARY ANGIOGRAM;  Surgeon: [name], MD;  Location: [facility] CATH LAB;  Service: Cardiovascular;  Laterality: N/A;  . LEG TENDON SURGERY  2008   Os peroneus resection and peroneal tendon repair  . pace Agricultural engineer    . PACEMAKER INSERTION  12/2007   Complete heart block  . PACEMAKER LEAD REMOVAL Left 07/18/2015   Procedure: ATRIAL PACEMAKER LEAD EXTRACTION; ATRIAL PACEMAKER LEAD INSERTION; GENERATOR REPLACEMENT.;  Surgeon: [name], MD;  Location: [facility];  Service: Cardiovascular;  Laterality: Left;  [name] to back up  . PERIPHERAL VASCULAR CATHETERIZATION N/A 02/12/2015   Procedure: Venogram;  Surgeon: [name], MD;  Location: [place] [facility] CV LAB;  Service: Cardiovascular;  Laterality: N/A;  . TEE WITHOUT CARDIOVERSION N/A 07/18/2015   Procedure: TRANSESOPHAGEAL ECHOCARDIOGRAM (TEE);  Surgeon: [name], MD;  Location: [facility] OR;  Service: Cardiovascular;  Laterality: N/A;    There were no vitals filed for this visit.   Subjective Assessment - 08/30/20 1019    Subjective Pt reports he is doing ok. No changes.    Pertinent History MS, CHB s/p PPM, HOCM    Limitations Walking;Writing;Standing;Lifting;House hold activities    Patient Stated Goals wants to be able to  walk better    Currently in Pain? No/denies    Pain Onset More than a month ago                             [facility] Adult PT Treatment/Exercise - 08/30/20 1022      Ambulation/Gait   Ambulation/Gait Yes    Ambulation/Gait Assistance 5: Supervision    Ambulation/Gait Assistance Details around in clinic between activities. Ambulated in/out with walking stick    Assistive device None    Gait Pattern Step-through pattern;Decreased hip/knee flexion - right    Ambulation Surface Level;Indoor     Curb 5: Supervision    Curb Details (indicate cue type and reason) x 3 without AD. Pt leading up with RLE so can be sure he clears foot.      Neuro Re-ed    Neuro Re-ed Details  Gait 345 with obstacle course each time weaving in and out of 4 cones, over blue mat and reciprocal steps over 3 2x4" bolsters x 3 bouts CGA. Pt only caught right toe 1 time causing him to take large step and reach for something to regain balance. Step-ups on rockerboard with RLE positioned ant/post x 10 then stepping up on rockerboard with RLE and tapping cone in front with left before stepping back to further increase SLS time CGA. Dynamic gait activities in hallway: stop/go on command 40' x 2, marching gait with opposite arm swing holding a couple seconds each step 40' x 2, tandem gait 40' x 2. CGA and occasional min assist especially with tandem touching wall at times as well.      Exercises   Exercises Other Exercises      Knee/Hip Exercises: Aerobic   Other Aerobic SciFit x 6 min level 3 to warm up at beginning for strengthening and improving activity tolerance.                  PT Education - 08/30/20 1300    Education Details Pt to continue with current HEP.    Person(s) Educated Patient    Methods Explanation    Comprehension Verbalized understanding            PT Short Term Goals - 08/23/20 1154      PT SHORT TERM GOAL #1   Title STGs=LTGs             PT Long Term Goals - 08/23/20 1150      PT LONG TERM GOAL #1   Title Pt will be independent with progressive HEP. ALL LTGS DUE 08/23/20    Baseline added exercises with ankle weights to HEP, pt will benefit from on-going additions    Time 4    Period Weeks    Status On-going    Target Date 09/22/20      PT LONG TERM GOAL #2   Title Pt will increase 30 sec sit to stand from 10 to 12 or more for improved functional strength.    Baseline 08/23/20 10 from chair without hands    Time 4    Period Weeks    Status New    Target Date  09/22/20      PT LONG TERM GOAL #3   Title Pt will increase FGA from 22/30 to >24/30 for improved balance and gait safety.    Baseline 08/20/20 22/30    Time 4    Period Weeks    Status New    Target  Date 09/22/20      PT LONG TERM GOAL #4   Title Pt will ambulate on outdoor/nonlevel surfaces with walking stick versus no AD mod I for improved safety with community mobility and walking in garden.    Time 4    Period Weeks    Status New    Target Date 09/22/20                 Plan - 08/30/20 1301    Clinical Impression Statement Pt continues to show improving functional strength with increased SLS time on RLE with activities. Does better on curb stepping up with RLE so can be sure he clears it.    Personal Factors and Comorbidities Comorbidity 3+;Age    Comorbidities MS, CHB s/p PPM, HOCM    Examination-Activity Limitations Squat;Locomotion Level;Transfers;Stairs;Stand    Examination-Participation Restrictions Yard Work;Occupation;Driving;Community Activity    Stability/Clinical Decision Making Evolving/Moderate complexity    Rehab Potential Good    PT Frequency 2x / week    PT Duration 4 weeks    PT Treatment/Interventions Gait training;Stair training;Functional mobility training;Therapeutic activities;Therapeutic exercise;Balance training;Neuromuscular re-education;Patient/family education;Orthotic Fit/Training;DME Instruction;ADLs/Self Care Home Management;Energy conservation;Manual techniques;Vestibular    PT Next Visit Plan Warm up on SciFit legs only for 6 min level 3. Continue with dynamic gait activities with and without walking stick on varied surfaces, high level balance and right hip strengthening.    Consulted and Agree with Plan of Care Patient           Patient will benefit from skilled therapeutic intervention in order to improve the following deficits and impairments:  Abnormal gait,Decreased coordination,Difficulty walking,Decreased endurance,Decreased  activity tolerance,Impaired perceived functional ability,Decreased balance,Decreased mobility,Decreased strength,Improper body mechanics  Visit Diagnosis: Other abnormalities of gait and mobility  Muscle weakness (generalized)  Hemiplegia and hemiparesis following cerebral infarction affecting right dominant side [facility])     Problem List Patient Active Problem List   Diagnosis Date Noted  . Stroke ([place]) 05/16/2020  . Acute focal neurological deficit 05/15/2020  . BPH (benign prostatic hyperplasia) 02/02/2016  . Fractured atrial pacemaker lead wire 07/18/2015  . 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 Chest pain 08/02/2013  . SOB (shortness of breath) 08/02/2013  . PVC (premature ventricular contraction) 02/13/2013  . Chest pain on exertion 02/25/2012  . Pacemaker-dual-chamber Medtronic 04/29/2011  . Change in bowel habits 01/22/2011  . Weight loss 01/22/2011  . COUGH 09/01/2010  . Multiple sclerosis ([place])   . HOCM (hypertrophic obstructive cardiomyopathy) ([place])   . Right bundle branch block   . Complete heart block ([place]) 12/05/2007    Electa Sniff, PT, DPT, NCS 08/30/2020, 1:04 PM  [facility] [address] [place], [place], [address] Phone: [phone]   Fax:  [phone]  Name: Rodney Adkins MRN: [id number] Date of Birth: 04/13/1956

## 2020-09-03 ENCOUNTER — Encounter: Payer: Self-pay | Admitting: Physical Therapy

## 2020-09-03 ENCOUNTER — Other Ambulatory Visit: Payer: Self-pay

## 2020-09-03 ENCOUNTER — Ambulatory Visit: Payer: BC Managed Care – PPO | Admitting: Occupational Therapy

## 2020-09-03 ENCOUNTER — Encounter: Payer: Self-pay | Admitting: Occupational Therapy

## 2020-09-03 ENCOUNTER — Ambulatory Visit: Payer: BC Managed Care – PPO | Attending: Family Medicine | Admitting: Physical Therapy

## 2020-09-03 DIAGNOSIS — I69351 Hemiplegia and hemiparesis following cerebral infarction affecting right dominant side: Secondary | ICD-10-CM

## 2020-09-03 DIAGNOSIS — R41840 Attention and concentration deficit: Secondary | ICD-10-CM | POA: Insufficient documentation

## 2020-09-03 DIAGNOSIS — M6281 Muscle weakness (generalized): Secondary | ICD-10-CM

## 2020-09-03 DIAGNOSIS — R2689 Other abnormalities of gait and mobility: Secondary | ICD-10-CM | POA: Diagnosis present

## 2020-09-03 DIAGNOSIS — R278 Other lack of coordination: Secondary | ICD-10-CM

## 2020-09-03 NOTE — Therapy (Signed)
[facility] [address] [place], [place], [address] Phone: [phone]   Fax:  [phone]  Occupational Therapy Treatment  Patient Details  Name: Rodney Adkins MRN: [id number] Date of Birth: [birth year] Referring Provider (OT): Darliss Cheney, MD   Encounter Date: 09/03/2020   OT End of Session - 09/03/20 1151    Visit Number 22    Number of Visits 30   updated at renewal   Date for OT Re-Evaluation 09/27/20   at renewal   [place] Time Period VL:MN renewal 08/23/2020    OT Start Time 1150    OT Stop Time 1230    OT Time Calculation (min) 40 min    Activity Tolerance Patient tolerated treatment well    Behavior During Therapy [facility] for tasks assessed/performed           Past Medical History:  Diagnosis Date  . Complete heart block [facility]) June 2009   a. Presented with hypotension/pulm edema 12/2007 when HOCM was also discovered. s/p dual chamber MDT pacemaker. Thallium stest 7/09 negative for infiltrative disease or ischemia.  Followed Dr [name]  Marland Kitchen Heart murmur   . HOCM (hypertrophic obstructive cardiomyopathy) ([place]) 2009   Discovered by ECHO when he developed CHB. Mgmt Dr Caryl Comes  and Dr [name] at [facility]. Has outflow obstruction on ECHO 2011  . Multiple sclerosis ([place]) 1998   Sxs started 1998 with L body numbness. MRI c/w MS. Started Avonex. Saw Dr [name] at [facility] and switched to Betaseron and was on for 5 yrs but developed depression and site rxns and D/C'd 2006. While on Betaseron, occassionally required solumedrol for flares.  . Pacemaker 2017  . Prostate enlargement   . Right bundle branch block   . SOB (shortness of breath)    a. PFTs 09/2010 essentially normal per pulm note (mild obst defect). b. 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  . Wenckebach    a. Decreased ex tolerance 2010/2012 - underwent stress echo to look @ effects of MV/LVOT - nothing noted; although pt  had high rate Wenckebach behavior with associated ex intol. Pacer reprogrammed with improvement in sx.    Past Surgical History:  Procedure Laterality Date  . LEFT HEART CATHETERIZATION WITH CORONARY ANGIOGRAM N/A 08/22/2013   Procedure: LEFT HEART CATHETERIZATION WITH CORONARY ANGIOGRAM;  Surgeon: [name], MD;  Location[facility] CATH LAB;  Service: Cardiovascular;  Laterality: N/A;  . LEG TENDON SURGERY  2008   Os peroneus resection and peroneal tendon repair  . pace Agricultural engineer    . PACEMAKER INSERTION  12/2007   Complete heart block  . PACEMAKER LEAD REMOVAL Left 07/18/2015   Procedure: ATRIAL PACEMAKER LEAD EXTRACTION; ATRIAL PACEMAKER LEAD INSERTION; GENERATOR REPLACEMENT.;  Surgeon: [name], MD;  Location: [facility];  Service: Cardiovascular;  Laterality: Left;  [name] to back up  . PERIPHERAL VASCULAR CATHETERIZATION N/A 02/12/2015   Procedure: Venogram;  Surgeon: [name], MD;  Location: [facility] CV LAB;  Service: Cardiovascular;  Laterality: N/A;  . TEE WITHOUT CARDIOVERSION N/A 07/18/2015   Procedure: TRANSESOPHAGEAL ECHOCARDIOGRAM (TEE);  Surgeon: [name], MD;  Location: [facility] OR;  Service: Cardiovascular;  Laterality: N/A;    There were no vitals filed for this visit.   Subjective Assessment - 09/03/20 1152    Subjective  Pt denies any pain today.    Patient is accompanied by: Family member    Pertinent History Fall Risk. Speech Deficits.  Limitations MS, CHB s/p PPM, HOCM    Patient Stated Goals right hand. 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 Return to driving recommendations.    Currently in Pain? 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  Simulated with ipad and tablet pencil for increase in working on legibility and accuracy. Pt with approx 60% legiblity with tablet      Neurological Re-education Exercises   Other Exercises 1 4.4 lb medball -both hands 10 x chest press, forward raise, horiz abd (bilaterally), diagonals (bilaterally)     Fine Motor Coordination (Hand/Wrist)   Fine Motor Coordination Purdue Pegboard    Purdue Pegboard Purdue pegboard with dice in 4th and 5th fingers in palm of hand to encourage pincer/tip pinch grip with RUE for placing items on board. Pt with min drops and min difficulty with task. Pt with increased time to complete task                    OT Short Term Goals - 08/23/20 1104      OT SHORT TERM GOAL #1   Title Pt will be independent with HEP 06/21/20    Time 4    Period Weeks    Status Achieved    Target Date 06/21/20      OT SHORT TERM GOAL #2   Title Pt will demonstrate improved fine motor coordination by demonstrating decrease in 9 hole peg test score by 6 seconds with RUE.    Baseline RUE 71.06    Time 4    Period Weeks    Status Achieved      OT SHORT TERM GOAL #3   Title Pt will perform simple warm meal prep or home management task with supervision and good safety awareness.    Time 4    Period Weeks    Status Achieved      OT SHORT TERM GOAL #4   Title Pt will write 2-3 sentences with 75% legibility and increased size of letters (mild micrographia).    Baseline severe micrographia    Time 4    Period Weeks    Status Achieved   3 sentences with good letter size and legibility     OT SHORT TERM GOAL #5   Title Pt will demonstrate increase in grip strength by 5 lbs in RUE for increased ease with ADLs and clothing management, etc.    Baseline RUE 45.4 (LUE 65.6)    Time 4    Period Weeks    Status Achieved   RUE 55.7 lbs     OT SHORT TERM GOAL #6   Title Pt will report using RUE (dominant hand) for 25% or more of functional tasks.    Time 4    Period Weeks     Status Achieved             OT Long Term Goals - 09/03/20 1218      OT LONG TERM GOAL #1   Title Pt will be independent with updated HEP 08/27/20    Time 4  Period Weeks    Status Achieved      OT LONG TERM GOAL #2   Title *Pt will demonstrate improved fine motor coordination by demonstrating decrease in 9 hole peg test score to 26 seconds or less with RUE.    Baseline at renewal RUE 30.94 seconds    Time 4    Period Weeks    Status On-going   08/23/2020 renewal - 30.94     OT LONG TERM GOAL #3   Title *Pt will perform work simulated tasks (typing, using mouse, etc) with RUE as dominant hand with 90% accuracy.    Time 4    Period Weeks    Status On-going   pt reports approx 70% to baseline.     OT LONG TERM GOAL #4   Title Pt will report completing at least 1 home management task per day with mod I in order to decrease caregiver burden.    Time 8    Period Weeks    Status Achieved      OT LONG TERM GOAL #5   Title Pt will improve grip strength by at least 10 lbs in RUE for strengthening dominant hand for functional use and tasks.    Baseline RUE 45.4 (LUE 65.6)    Time 8    Period Weeks    Status Achieved   61 RUE at renewal 08/23/20     OT LONG TERM GOAL #6   Title Pt will report using RUE (dominant hand) for at least 75% of functional tasks per day.    Time 8    Period Weeks    Status Achieved      OT LONG TERM GOAL #7   Title Pt will perform physical and cognitive tasks simultaneously with 95% accuracy for returning to prior tasks.    Time 8    Period Weeks    Status Achieved   100%     OT LONG TERM GOAL #8   Title Pt will write 3-5 sentences with 90% legibility and appropriate sizing.    Time 8    Period Weeks    Status Achieved   still has fatigue but much improved and takes increased time.     OT LONG TERM GOAL  #9   TITLE Pt will be independent with upgraded HEP for targeting proximal strength in RUE.    Time 4    Period Weeks    Status New       OT LONG TERM GOAL  #10   TITLE *Pt will improve grip strength by at least 5 lbs in RUE for strengthening dominant hand for functional use and tasks.    Baseline renewal 61 lbs RUE    Time 4    Period Weeks    Status New                 Plan - 09/03/20 1152    Clinical Impression Statement Pt is due for renewal at this time. Pt has met all STGs and has met 5/8 LTGs. Upgraded grip strength goal and added goal to target proximal strength in RUE with HEP and continuing to work on fine motor coordination with RUE. Pt has 2 on-going goals and 2 new goals. Pt continues to benefit from skilled OT to target goals.    OT Occupational Profile and History Detailed Assessment- Review of Records and additional review of physical, cognitive, psychosocial history related to current functional performance    Occupational performance deficits (Please refer  to evaluation for details): IADL's;ADL's;Social Participation;Leisure;Work;Rest and Sleep    Body Structure / Function / Physical Skills ADL;Dexterity;Decreased knowledge of use of DME;Strength;UE functional use;Endurance;IADL;Coordination;FMC    Rehab Potential Good    Clinical Decision Making Limited treatment options, no task modification necessary    Comorbidities Affecting Occupational Performance: None    Modification or Assistance to Complete Evaluation  No modification of tasks or assist necessary to complete eval    OT Frequency 2x / week    OT Duration 4 weeks   or 8 visits from renewal 07/30/20   OT Treatment/Interventions Self-care/ADL training;DME and/or AE instruction;Gait Training;Therapeutic activities;Balance training;Cognitive remediation/compensation;Therapeutic exercise;Neuromuscular education;Passive range of motion;Visual/perceptual remediation/compensation;Patient/family education;Energy conservation    Plan coordination, proximal strength, upgrade HEP for proximal strength    OT Home Exercise Plan coordination HEP    Consulted  and Agree with Plan of Care Patient;Family member/caregiver   spouse   Family Member Consulted spouse           Patient will benefit from skilled therapeutic intervention in order to improve the following deficits and impairments:   Body Structure / Function / Physical Skills: ADL,Dexterity,Decreased knowledge of use of DME,Strength,UE functional use,Endurance,IADL,Coordination,FMC       Visit Diagnosis: Other abnormalities of gait and mobility  Muscle weakness (generalized)  Hemiplegia and hemiparesis following cerebral infarction affecting right dominant side (HCC)  Other lack of coordination  Attention and concentration deficit    Problem List Patient Active Problem List   Diagnosis Date Noted  . Stroke ([place]) 05/16/2020  . Acute focal neurological deficit 05/15/2020  . BPH (benign prostatic hyperplasia) 02/02/2016  . Fractured atrial pacemaker lead wire 07/18/2015  . 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 Chest pain 08/02/2013  . SOB (shortness of breath) 08/02/2013  . PVC (premature ventricular contraction) 02/13/2013  . Chest pain on exertion 02/25/2012  . Pacemaker-dual-chamber Medtronic 04/29/2011  . Change in bowel habits 01/22/2011  . Weight loss 01/22/2011  . COUGH 09/01/2010  . Multiple sclerosis ([place])   . HOCM (hypertrophic obstructive cardiomyopathy) ([place])   . Right bundle branch block   . Complete heart block ([place]) 12/05/2007    Zachery Conch MOT, OTR/L  09/03/2020, 12:23 PM  [facility] [address] [place], [place], [address] Phone: [phone]   Fax:  [phone]  Name: DAY DEERY MRN: [id number] Date of Birth: Jan 13, 1956

## 2020-09-03 NOTE — Therapy (Signed)
[facility] [address] [place], [place], [address] Phone: [phone]   Fax:  [phone]  Physical Therapy Treatment  Patient Details  Name: Rodney Adkins MRN: [id number] Date of Birth: [birth year] Referring Provider (PT): Otto Herb, MD   Encounter Date: 09/03/2020   PT End of Session - 09/03/20 1630    Visit Number 28    Number of Visits 33    Authorization Type BCBS PPO - FOTO patient    PT Start Time 1232    PT Stop Time 1312    PT Time Calculation (min) 40 min    Equipment Utilized During Treatment Gait belt    Activity Tolerance Patient tolerated treatment well    Behavior During Therapy WFL for tasks assessed/performed           Past Medical History:  Diagnosis Date  . Complete heart block [facility]) June 2009   a. Presented with hypotension/pulm edema 12/2007 when HOCM was also discovered. s/p dual chamber MDT pacemaker. Thallium stest 7/09 negative for infiltrative disease or ischemia.  Followed Dr [name]  Marland Kitchen Heart murmur   . HOCM (hypertrophic obstructive cardiomyopathy) ([place]) 2009   Discovered by ECHO when he developed CHB. Mgmt Dr Caryl Comes  and Dr [name] at [facility] Has outflow obstruction on ECHO 2011  . Multiple sclerosis ([place]) 1998   Sxs started 1998 with L body numbness. MRI c/w MS. Started Avonex. Saw Dr [name] at [facility] and switched to Betaseron and was on for 5 yrs but developed depression and site rxns and D/C'd 2006. While on Betaseron, occassionally required solumedrol for flares.  . Pacemaker 2017  . Prostate enlargement   . Right bundle branch block   . SOB (shortness of breath)    a. PFTs 09/2010 essentially normal per pulm note (mild obst defect). b. 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  . 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 Pacer  reprogrammed with improvement in sx.    Past Surgical History:  Procedure Laterality Date  . LEFT HEART CATHETERIZATION WITH CORONARY ANGIOGRAM N/A 08/22/2013   Procedure: LEFT HEART CATHETERIZATION WITH CORONARY ANGIOGRAM;  Surgeon: [name], MD;  Location: [facility] CATH LAB;  Service: Cardiovascular;  Laterality: N/A;  . LEG TENDON SURGERY  2008   Os peroneus resection and peroneal tendon repair  . pace Agricultural engineer    . PACEMAKER INSERTION  12/2007   Complete heart block  . PACEMAKER LEAD REMOVAL Left 07/18/2015   Procedure: ATRIAL PACEMAKER LEAD EXTRACTION; ATRIAL PACEMAKER LEAD INSERTION; GENERATOR REPLACEMENT.;  Surgeon: [name], MD;  Location: [place];  Service: Cardiovascular;  Laterality: Left;  [name] to back up  . PERIPHERAL VASCULAR CATHETERIZATION N/A 02/12/2015   Procedure: Venogram;  Surgeon: [name], MD;  Location: [place] CV LAB;  Service: Cardiovascular;  Laterality: N/A;  . TEE WITHOUT CARDIOVERSION N/A 07/18/2015   Procedure: TRANSESOPHAGEAL ECHOCARDIOGRAM (TEE);  Surgeon: [name], MD;  Location: [facility] OR;  Service: Cardiovascular;  Laterality: N/A;    There were no vitals filed for this visit.   Subjective Assessment - 09/03/20 1234    Subjective No changes since he was last here.    Pertinent History MS, CHB s/p PPM, HOCM    Limitations Walking;Writing;Standing;Lifting;House hold activities    Patient Stated Goals wants to be able to walk  better    Currently in Pain? No/denies    Pain Onset More than a month ago                             [facility] Adult PT Treatment/Exercise - 09/03/20 0001      Ambulation/Gait   Ambulation/Gait Yes    Ambulation/Gait Assistance 5: Supervision;4: Min guard    Ambulation/Gait Assistance Details gait over pavement and grass with no AD, cues for incr R hip flexion and to relax arms for arm swing, pt with slower gait speed when ambulating over grass.    Ambulation Distance (Feet) 150 Feet   x1, 300  x1   Assistive device None    Gait Pattern Step-through pattern;Decreased hip/knee flexion - right    Ambulation Surface Level;Unlevel;Indoor;Paved;Outdoor;Grass    Curb 5: Supervision    Curb Details (indicate cue type and reason) x3 reps outdoors with no AD, 2 reps leading with LLE (did not catch R foot), 1 rep leading with RLE      Exercises   Exercises Other Exercises      Knee/Hip Exercises: Aerobic   Other Aerobic SciFit x 6 min level 3 to warm up at beginning for strengthening and improving activity tolerance. BLE only               Balance Exercises - 09/03/20 0001      Balance Exercises: Standing   SLS with Vectors Foam/compliant surface;Intermittent upper extremity assist    SLS with Vectors Limitations with RLE on blue air ex and LLE resting gently on 4" step for modified SLS, 2 x 5 reps head turns, initially needing intermittent UE support    Step Ups Forward;Limitations;UE support 1    Step Ups Limitations with blue air ex on 4" step, x10 reps with RLE and floating LLE in the air for SLS, cues for incr hip/knee flexion with RLE when stepping on step    Marching Solid surface    Marching Limitations with reciprocal arm raise down and back at countertop x2 reps, cues to stand tall through RLE for glute/quad activation    Other Standing Exercises stepping on 6 stepping stones next to countertop down and back x4 reps, beginning with UE support and progressing to none, trying to add in reciprocal arm swing during last couple of reps               PT Short Term Goals - 08/23/20 1154      PT SHORT TERM GOAL #1   Title STGs=LTGs             PT Long Term Goals - 08/23/20 1150      PT LONG TERM GOAL #1   Title Pt will be independent with progressive HEP. ALL LTGS DUE 08/23/20    Baseline added exercises with ankle weights to HEP, pt will benefit from on-going additions    Time 4    Period Weeks    Status On-going    Target Date 09/22/20      PT LONG TERM  GOAL #2   Title Pt will increase 30 sec sit to stand from 10 to 12 or more for improved functional strength.    Baseline 08/23/20 10 from chair without hands    Time 4    Period Weeks    Status New    Target Date 09/22/20      PT LONG TERM GOAL #3   Title  Pt will increase FGA from 22/30 to >24/30 for improved balance and gait safety.    Baseline 08/20/20 22/30    Time 4    Period Weeks    Status New    Target Date 09/22/20      PT LONG TERM GOAL #4   Title Pt will ambulate on outdoor/nonlevel surfaces with walking stick versus no AD mod I for improved safety with community mobility and walking in garden.    Time 4    Period Weeks    Status New    Target Date 09/22/20                 Plan - 09/03/20 1632    Clinical Impression Statement Performed gait training outside with no AD over grass and pavement, with pt needing min guard during gait, esp over grass with pt having a tendency to slow down gait speed. One episode of mild LOB, but pt able to correct. Remainder of session focused on SLS activities on RLE, pt improving with stability with incr reps. Will continue to progress towards LTGs.    Personal Factors and Comorbidities Comorbidity 3+;Age    Comorbidities MS, CHB s/p PPM, HOCM    Examination-Activity Limitations Squat;Locomotion Level;Transfers;Stairs;Stand    Examination-Participation Restrictions Yard Work;Occupation;Driving;Community Activity    Stability/Clinical Decision Making Evolving/Moderate complexity    Rehab Potential Good    PT Frequency 2x / week    PT Duration 4 weeks    PT Treatment/Interventions Gait training;Stair training;Functional mobility training;Therapeutic activities;Therapeutic exercise;Balance training;Neuromuscular re-education;Patient/family education;Orthotic Fit/Training;DME Instruction;ADLs/Self Care Home Management;Energy conservation;Manual techniques;Vestibular    PT Next Visit Plan Warm up on SciFit legs only for 6 min level 3.  Continue with dynamic gait activities with and without walking stick on varied surfaces, high level balance and right hip strengthening.    Consulted and Agree with Plan of Care Patient           Patient will benefit from skilled therapeutic intervention in order to improve the following deficits and impairments:  Abnormal gait,Decreased coordination,Difficulty walking,Decreased endurance,Decreased activity tolerance,Impaired perceived functional ability,Decreased balance,Decreased mobility,Decreased strength,Improper body mechanics  Visit Diagnosis: Other abnormalities of gait and mobility  Muscle weakness (generalized)  Other lack of coordination  Hemiplegia and hemiparesis following cerebral infarction affecting right dominant side [facility])     Problem List Patient Active Problem List   Diagnosis Date Noted  . Stroke ([place]) 05/16/2020  . Acute focal neurological deficit 05/15/2020  . BPH (benign prostatic hyperplasia) 02/02/2016  . Fractured atrial pacemaker lead wire 07/18/2015  . 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 Chest pain 08/02/2013  . SOB (shortness of breath) 08/02/2013  . PVC (premature ventricular contraction) 02/13/2013  . Chest pain on exertion 02/25/2012  . Pacemaker-dual-chamber Medtronic 04/29/2011  . Change in bowel habits 01/22/2011  . Weight loss 01/22/2011  . COUGH 09/01/2010  . Multiple sclerosis ([place])   . HOCM (hypertrophic obstructive cardiomyopathy) ([place])   . Right bundle branch block   . Complete heart block ([place]) 12/05/2007    Lillia Pauls, DPT  09/03/2020, 4:34 PM  [facility] [address] [place], [place], [address] Phone: [phone]   Fax:  [phone]  Name: Rodney Adkins MRN: [id number] Date of Birth: [birth year]

## 2020-09-05 ENCOUNTER — Encounter: Payer: Self-pay | Admitting: Occupational Therapy

## 2020-09-05 ENCOUNTER — Ambulatory Visit: Payer: BC Managed Care – PPO | Admitting: Occupational Therapy

## 2020-09-05 ENCOUNTER — Encounter: Payer: Self-pay | Admitting: Physical Therapy

## 2020-09-05 ENCOUNTER — Other Ambulatory Visit: Payer: Self-pay

## 2020-09-05 ENCOUNTER — Ambulatory Visit: Payer: BC Managed Care – PPO | Admitting: Physical Therapy

## 2020-09-05 DIAGNOSIS — R278 Other lack of coordination: Secondary | ICD-10-CM

## 2020-09-05 DIAGNOSIS — R41840 Attention and concentration deficit: Secondary | ICD-10-CM

## 2020-09-05 DIAGNOSIS — I69351 Hemiplegia and hemiparesis following cerebral infarction affecting right dominant side: Secondary | ICD-10-CM

## 2020-09-05 DIAGNOSIS — R2689 Other abnormalities of gait and mobility: Secondary | ICD-10-CM

## 2020-09-05 DIAGNOSIS — M6281 Muscle weakness (generalized): Secondary | ICD-10-CM

## 2020-09-05 NOTE — Therapy (Signed)
[facility] [address] [place], [place], [address] Phone: [phone]   Fax:  [phone]  Occupational Therapy Treatment  Patient Details  Name: Rodney Adkins MRN: [id number] Date of Birth: Feb 18, 1956 Referring Provider (OT): Darliss Cheney, MD   Encounter Date: 09/05/2020   OT End of Session - 09/05/20 1101    Visit Number 23    Number of Visits 30   updated at renewal   Date for OT Re-Evaluation 09/27/20   at renewal   Authorization Type [place] Time Period VL:MN renewal    OT Start Time 1100    OT Stop Time 1145    OT Time Calculation (min) 45 min    Activity Tolerance Patient tolerated treatment well    Behavior During Therapy [facility] for tasks assessed/performed           Past Medical History:  Diagnosis Date  . Complete heart block [facility]) June 2009   a. Presented with hypotension/pulm edema 12/2007 when HOCM was also discovered. s/p dual chamber MDT pacemaker. Thallium stest 7/09 negative for infiltrative disease or ischemia.  Followed Dr [name]  Marland Kitchen Heart murmur   . HOCM (hypertrophic obstructive cardiomyopathy) ([place]) 2009   Discovered by ECHO when he developed CHB. Mgmt Dr Caryl Comes  and Dr [name] at [facility]. Has outflow obstruction on ECHO 2011  . Multiple sclerosis ([place]) 1998   Sxs started 1998 with L body numbness. MRI c/w MS. Started Avonex. Saw Dr [name] at [facility] and switched to Betaseron and was on for 5 yrs but developed depression and site rxns and D/C'd 2006. While on Betaseron, occassionally required solumedrol for flares.  . Pacemaker 2017  . Prostate enlargement   . Right bundle branch block   . SOB (shortness of breath)    a. PFTs 09/2010 essentially normal per pulm note (mild obst defect). b. 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  . Wenckebach    a. Decreased ex tolerance 2010/2012 - underwent stress echo to look @ effects of MV/LVOT - nothing noted; although pt had high  rate Wenckebach behavior with associated ex intol. Pacer reprogrammed with improvement in sx.    Past Surgical History:  Procedure Laterality Date  . LEFT HEART CATHETERIZATION WITH CORONARY ANGIOGRAM N/A 08/22/2013   Procedure: LEFT HEART CATHETERIZATION WITH CORONARY ANGIOGRAM;  Surgeon: [name], MD;  Location: [facility] CATH LAB;  Service: Cardiovascular;  Laterality: N/A;  . LEG TENDON SURGERY  2008   Os peroneus resection and peroneal tendon repair  . pace Agricultural engineer    . PACEMAKER INSERTION  12/2007   Complete heart block  . PACEMAKER LEAD REMOVAL Left 07/18/2015   Procedure: ATRIAL PACEMAKER LEAD EXTRACTION; ATRIAL PACEMAKER LEAD INSERTION; GENERATOR REPLACEMENT.;  Surgeon: [name], MD;  Location: [place];  Service: Cardiovascular;  Laterality: Left;  [name] to back up  . PERIPHERAL VASCULAR CATHETERIZATION N/A 02/12/2015   Procedure: Venogram;  Surgeon: [name], MD;  Location: [place] CV LAB;  Service: Cardiovascular;  Laterality: N/A;  . TEE WITHOUT CARDIOVERSION N/A 07/18/2015   Procedure: TRANSESOPHAGEAL ECHOCARDIOGRAM (TEE);  Surgeon: [name], MD;  Location: [facility] OR;  Service: Cardiovascular;  Laterality: N/A;    There were no vitals filed for this visit.   Subjective Assessment - 09/05/20 1102    Subjective  Pt denies pain. Pt reports still having some trouble with writing with getting smaller as he goes and tiring.    Patient is  accompanied by: Family member    Pertinent History Fall Risk. Speech Deficits.    Limitations MS, CHB s/p PPM, HOCM    Patient Stated Goals right hand. 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 Return to driving recommendations.    Currently in Pain? No/denies                        OT Treatments/Exercises (OP) - 09/05/20 1104      Hand Exercises   Other Hand Exercises Resistance clothespins 1-8# with RUE for grip strengthening and coordination and proximal conditioning at  shoulder      Work Hardening Exercises   Stationary Bike Arm Bike 6 minutes with BUE for conditioning on level 3      Fine Motor Coordination (Hand/Wrist)   Fine Motor Coordination Small Pegboard    Small Pegboard Small Pegs with RUE with holding paperclip in palm of hand to encourage tip pinch grasp                  OT Education - 09/05/20 1125    Education Details shoulder/elbow dumbbell exercises - see pt instructions. Pt used 1lb dumbbell.   Person(s) Educated Patient    Methods Explanation;Demonstration;Handout    Comprehension Verbalized understanding;Returned demonstration            OT Short Term Goals - 08/23/20 1104      OT SHORT TERM GOAL #1   Title Pt will be independent with HEP 06/21/20    Time 4    Period Weeks    Status Achieved    Target Date 06/21/20      OT SHORT TERM GOAL #2   Title Pt will demonstrate improved fine motor coordination by demonstrating decrease in 9 hole peg test score by 6 seconds with RUE.    Baseline RUE 71.06    Time 4    Period Weeks    Status Achieved      OT SHORT TERM GOAL #3   Title Pt will perform simple warm meal prep or home management task with supervision and good safety awareness.    Time 4    Period Weeks    Status Achieved      OT SHORT TERM GOAL #4   Title Pt will write 2-3 sentences with 75% legibility and increased size of letters (mild micrographia).    Baseline severe micrographia    Time 4    Period Weeks    Status Achieved   3 sentences with good letter size and legibility     OT SHORT TERM GOAL #5   Title Pt will demonstrate increase in grip strength by 5 lbs in RUE for increased ease with ADLs and clothing management, etc.    Baseline RUE 45.4 (LUE 65.6)    Time 4    Period Weeks    Status Achieved   RUE 55.7 lbs     OT SHORT TERM GOAL #6   Title Pt will report using RUE (dominant hand) for 25% or more of functional tasks.    Time 4    Period Weeks    Status Achieved              OT Long Term Goals - 09/05/20 1128      OT LONG TERM GOAL #1   Title Pt will be independent with updated HEP 08/27/20    Time 4    Period Weeks    Status Achieved  OT LONG TERM GOAL #2   Title *Pt will demonstrate improved fine motor coordination by demonstrating decrease in 9 hole peg test score to 26 seconds or less with RUE.    Baseline at renewal RUE 30.94 seconds    Time 4    Period Weeks    Status On-going   08/23/2020 renewal - 30.94     OT LONG TERM GOAL #3   Title *Pt will perform work simulated tasks (typing, using mouse, etc) with RUE as dominant hand with 90% accuracy.    Time 4    Period Weeks    Status On-going   pt reports approx 70% to baseline.     OT LONG TERM GOAL #4   Title Pt will report completing at least 1 home management task per day with mod I in order to decrease caregiver burden.    Time 8    Period Weeks    Status Achieved      OT LONG TERM GOAL #5   Title Pt will improve grip strength by at least 10 lbs in RUE for strengthening dominant hand for functional use and tasks.    Baseline RUE 45.4 (LUE 65.6)    Time 8    Period Weeks    Status Achieved   61 RUE at renewal 08/23/20     OT LONG TERM GOAL #6   Title Pt will report using RUE (dominant hand) for at least 75% of functional tasks per day.    Time 8    Period Weeks    Status Achieved      OT LONG TERM GOAL #7   Title Pt will perform physical and cognitive tasks simultaneously with 95% accuracy for returning to prior tasks.    Time 8    Period Weeks    Status Achieved   100%     OT LONG TERM GOAL #8   Title Pt will write 3-5 sentences with 90% legibility and appropriate sizing.    Time 8    Period Weeks    Status Achieved   still has fatigue but much improved and takes increased time.     OT LONG TERM GOAL  #9   TITLE Pt will be independent with upgraded HEP for targeting proximal strength in RUE.    Time 4    Period Weeks    Status On-going      OT LONG TERM GOAL  #10    TITLE *Pt will improve grip strength by at least 5 lbs in RUE for strengthening dominant hand for functional use and tasks.    Baseline renewal 61 lbs RUE    Time 4    Period Weeks    Status On-going                 Plan - 09/05/20 1108    Clinical Impression Statement Pt is progressing towards remaining goals post renewal. Pt continues to have deficits with fine motor coordination and proximal strength in RUE.    OT Occupational Profile and History Detailed Assessment- Review of Records and additional review of physical, cognitive, psychosocial history related to current functional performance    Occupational performance deficits (Please refer to evaluation for details): IADL's;ADL's;Social Participation;Leisure;Work;Rest and Sleep    Body Structure / Function / Physical Skills ADL;Dexterity;Decreased knowledge of use of DME;Strength;UE functional use;Endurance;IADL;Coordination;FMC    Rehab Potential Good    Clinical Decision Making Limited treatment options, no task modification necessary    Comorbidities Affecting Occupational Performance:  None    Modification or Assistance to Complete Evaluation  No modification of tasks or assist necessary to complete eval    OT Frequency 2x / week    OT Duration 4 weeks   or 8 visits from renewal 07/30/20   OT Treatment/Interventions Self-care/ADL training;DME and/or AE instruction;Gait Training;Therapeutic activities;Balance training;Cognitive remediation/compensation;Therapeutic exercise;Neuromuscular education;Passive range of motion;Visual/perceptual remediation/compensation;Patient/family education;Energy conservation    Plan coordination, proximal strength, upgrade HEP for proximal strength    OT Home Exercise Plan coordination HEP    Consulted and Agree with Plan of Care Patient;Family member/caregiver   spouse   Family Member Consulted spouse           Patient will benefit from skilled therapeutic intervention in order to improve the  following deficits and impairments:   Body Structure / Function / Physical Skills: ADL,Dexterity,Decreased knowledge of use of DME,Strength,UE functional use,Endurance,IADL,Coordination,FMC       Visit Diagnosis: Other abnormalities of gait and mobility  Hemiplegia and hemiparesis following cerebral infarction affecting right dominant side (HCC)  Muscle weakness (generalized)  Other lack of coordination  Attention and concentration deficit    Problem List Patient Active Problem List   Diagnosis Date Noted  . Stroke ([place]) 05/16/2020  . Acute focal neurological deficit 05/15/2020  . BPH (benign prostatic hyperplasia) 02/02/2016  . Fractured atrial pacemaker lead wire 07/18/2015  . 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 Chest pain 08/02/2013  . SOB (shortness of breath) 08/02/2013  . PVC (premature ventricular contraction) 02/13/2013  . Chest pain on exertion 02/25/2012  . Pacemaker-dual-chamber Medtronic 04/29/2011  . Change in bowel habits 01/22/2011  . Weight loss 01/22/2011  . COUGH 09/01/2010  . Multiple sclerosis ([place])   . HOCM (hypertrophic obstructive cardiomyopathy) ([place])   . Right bundle branch block   . Complete heart block ([place]) 12/05/2007    Zachery Conch MOT, OTR/L  09/05/2020, 11:38 AM  [facility] [address] [place], [place], [address] Phone: [phone]   Fax:  [phone]  Name: YU PEGGS MRN: [id number] Date of Birth: February 04, 1956

## 2020-09-05 NOTE — Patient Instructions (Signed)
  Access Code: [email or web address] URL: [email or web address] Date: 09/05/2020 Prepared by: Waldo Laine  Exercises Seated Chest Press with Dumbbells - 1 x daily - 7 x weekly - 3 sets - 10 reps Seated Overhead Press with Dumbbells - 1 x daily - 7 x weekly - 3 sets - 10 reps Seated Shoulder Abduction with Dumbbells - Thumbs Up - 1 x daily - 7 x weekly - 3 sets - 10 reps Seated Shoulder Flexion with Dumbbells - 1 x daily - 7 x weekly - 3 sets - 10 reps Seated Shoulder Horizontal Abduction with Dumbbells - Thumbs Up - 1 x daily - 7 x weekly - 3 sets - 10 reps Seated Single Arm Bicep Curls with Rotation and Dumbbell - 1 x daily - 7 x weekly - 3 sets - 10 reps Seated Triceps Extension with Dumbbell Single Arm - 1 x daily - 7 x weekly - 3 sets - 10 reps

## 2020-09-05 NOTE — Therapy (Addendum)
[facility] [address] [place], [place], [address] Phone: [phone]   Fax:  [phone]  Physical Therapy Treatment  Patient Details  Name: Rodney Adkins MRN: [id number] Date of Birth: [birth year] Referring Provider (PT): Otto Herb, MD   Encounter Date: 09/05/2020   PT End of Session - 09/05/20 1112    Visit Number 29    Number of Visits 33    Authorization Type BCBS PPO - FOTO patient    PT Start Time 4627    PT Stop Time 1059    PT Time Calculation (min) 41 min    Equipment Utilized During Treatment Gait belt    Activity Tolerance Patient tolerated treatment well    Behavior During Therapy WFL for tasks assessed/performed           Past Medical History:  Diagnosis Date  . Complete heart block [facility]) June 2009   a. Presented with hypotension/pulm edema 12/2007 when HOCM was also discovered. s/p dual chamber MDT pacemaker. Thallium stest 7/09 negative for infiltrative disease or ischemia.  Followed Dr [name]  Marland Kitchen Heart murmur   . HOCM (hypertrophic obstructive cardiomyopathy) ([place]) 2009   Discovered by ECHO when he developed CHB. Mgmt Dr Caryl Comes  and Dr [name] at [facility]. Has outflow obstruction on ECHO 2011  . Multiple sclerosis ([place]) 1998   Sxs started 1998 with L body numbness. MRI c/w MS. Started Avonex. Saw Dr [name] at [facility] and switched to Betaseron and was on for 5 yrs but developed depression and site rxns and D/C'd 2006. While on Betaseron, occassionally required solumedrol for flares.  . Pacemaker 2017  . Prostate enlargement   . Right bundle branch block   . SOB (shortness of breath)    a. PFTs 09/2010 essentially normal per pulm note (mild obst defect). b. 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  . 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 Pacer  reprogrammed with improvement in sx.    Past Surgical History:  Procedure Laterality Date  . LEFT HEART CATHETERIZATION WITH CORONARY ANGIOGRAM N/A 08/22/2013   Procedure: LEFT HEART CATHETERIZATION WITH CORONARY ANGIOGRAM;  Surgeon: [name], MD;  Location: [facility] CATH LAB;  Service: Cardiovascular;  Laterality: N/A;  . LEG TENDON SURGERY  2008   Os peroneus resection and peroneal tendon repair  . pace Agricultural engineer    . PACEMAKER INSERTION  12/2007   Complete heart block  . PACEMAKER LEAD REMOVAL Left 07/18/2015   Procedure: ATRIAL PACEMAKER LEAD EXTRACTION; ATRIAL PACEMAKER LEAD INSERTION; GENERATOR REPLACEMENT.;  Surgeon: [name], MD;  Location: [facility];  Service: Cardiovascular;  Laterality: Left;  [name] to back up  . PERIPHERAL VASCULAR CATHETERIZATION N/A 02/12/2015   Procedure: Venogram;  Surgeon: [name], MD;  Location: [facility] CV LAB;  Service: Cardiovascular;  Laterality: N/A;  . TEE WITHOUT CARDIOVERSION N/A 07/18/2015   Procedure: TRANSESOPHAGEAL ECHOCARDIOGRAM (TEE);  Surgeon: [name], MD;  Location: [facility] OR;  Service: Cardiovascular;  Laterality: N/A;    There were no vitals filed for this visit.   Subjective Assessment - 09/05/20 1021    Subjective Nothing new, same old stuff.    Pertinent History MS, CHB s/p PPM, HOCM    Limitations Walking;Writing;Standing;Lifting;House hold activities    Patient Stated Goals wants to be able to walk better  Currently in Pain? No/denies    Pain Onset More than a month ago                             [facility] Adult PT Treatment/Exercise - 09/05/20 1023      Ambulation/Gait   Ambulation/Gait Yes    Ambulation/Gait Assistance 5: Supervision;4: Min guard    Ambulation/Gait Assistance Details no AD between activities in session, outdoors with single walking pole with scanning environment and performing head motions, pt with incr difficulty with looking up, slowing down gait speed at times.     Ambulation Distance (Feet) 300 Feet    Assistive device --   walking stick   Gait Pattern Step-through pattern;Decreased hip/knee flexion - right    Ambulation Surface Level;Unlevel;Outdoor;Grass      High Level Balance   High Level Balance Activities Side stepping;Negotiating over obstacles    High Level Balance Comments on blue mat side stepping and alternating foot taps to 4 cones for SLS on both legs, then reciprocal stepping over 2 4" obstacles on red mat with no AD, min guard/min A for balance down and back 3 reps      Knee/Hip Exercises: Aerobic   Other Aerobic SciFit x 6 min level 3 to warm up at beginning for strengthening and improving activity tolerance. BLE only               Balance Exercises - 09/05/20 0001      Balance Exercises: Standing   SLS with Vectors Foam/compliant surface    SLS with Vectors Limitations on air ex: standing on RLE rolling soccer ball with LLE forwards/backwards 2 x 5 reps B, alternating toe taps to soccer ball x8 reps B    Stepping Strategy Posterior;10 reps    Stepping Strategy Limitations x10 reps on rockerboard in A/P direction, stepping LLE back only for SLS on RLE, no UE support   Rockerboard Anterior/posterior;EO    Rockerboard Limitations x10 reps lifting LLE in a gentle march for a couple seconds for SLS on RLE no UE support    Tandem Gait Forward;3 reps;Limitations    Tandem Gait Limitations red mat, down and back, intermittent UE support               PT Short Term Goals - 08/23/20 1154      PT SHORT TERM GOAL #1   Title STGs=LTGs             PT Long Term Goals - 08/23/20 1150      PT LONG TERM GOAL #1   Title Pt will be independent with progressive HEP. ALL LTGS DUE 08/23/20    Baseline added exercises with ankle weights to HEP, pt will benefit from on-going additions    Time 4    Period Weeks    Status On-going    Target Date 09/22/20      PT LONG TERM GOAL #2   Title Pt will increase 30 sec sit to stand from  10 to 12 or more for improved functional strength.    Baseline 08/23/20 10 from chair without hands    Time 4    Period Weeks    Status New    Target Date 09/22/20      PT LONG TERM GOAL #3   Title Pt will increase FGA from 22/30 to >24/30 for improved balance and gait safety.    Baseline 08/20/20 22/30    Time 4  Period Weeks    Status New    Target Date 09/22/20      PT LONG TERM GOAL #4   Title Pt will ambulate on outdoor/nonlevel surfaces with walking stick versus no AD mod I for improved safety with community mobility and walking in garden.    Time 4    Period Weeks    Status New    Target Date 09/22/20                 Plan - 09/05/20 1148    Clinical Impression Statement Focused on balance strategies on compliant surfaces for SLS on RLE and gait outdoors on grass with walking stick. Pt with improvement of SLS activities on RLE on different surfaces today. Pt most challenged with gait outdoors when scanning environment (esp when looking up). Will continue to progress towards LTGs.    Personal Factors and Comorbidities Comorbidity 3+;Age    Comorbidities MS, CHB s/p PPM, HOCM    Examination-Activity Limitations Squat;Locomotion Level;Transfers;Stairs;Stand    Examination-Participation Restrictions Yard Work;Occupation;Driving;Community Activity    Stability/Clinical Decision Making Evolving/Moderate complexity    Rehab Potential Good    PT Frequency 2x / week    PT Duration 4 weeks    PT Treatment/Interventions Gait training;Stair training;Functional mobility training;Therapeutic activities;Therapeutic exercise;Balance training;Neuromuscular re-education;Patient/family education;Orthotic Fit/Training;DME Instruction;ADLs/Self Care Home Management;Energy conservation;Manual techniques;Vestibular    PT Next Visit Plan Warm up on SciFit legs only for 6 min level 3. Continue with dynamic gait activities with and without walking stick on varied surfaces, high level balance  and right hip strengthening. gait outdoors if weather permits.    Consulted and Agree with Plan of Care Patient           Patient will benefit from skilled therapeutic intervention in order to improve the following deficits and impairments:  Abnormal gait,Decreased coordination,Difficulty walking,Decreased endurance,Decreased activity tolerance,Impaired perceived functional ability,Decreased balance,Decreased mobility,Decreased strength,Improper body mechanics  Visit Diagnosis: Other abnormalities of gait and mobility  Muscle weakness (generalized)  Hemiplegia and hemiparesis following cerebral infarction affecting right dominant side ([place])  Other lack of coordination     Problem List Patient Active Problem List   Diagnosis Date Noted  . Stroke ([place]) 05/16/2020  . Acute focal neurological deficit 05/15/2020  . BPH (benign prostatic hyperplasia) 02/02/2016  . Fractured atrial pacemaker lead wire 07/18/2015  . 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 Chest pain 08/02/2013  . SOB (shortness of breath) 08/02/2013  . PVC (premature ventricular contraction) 02/13/2013  . Chest pain on exertion 02/25/2012  . Pacemaker-dual-chamber Medtronic 04/29/2011  . Change in bowel habits 01/22/2011  . Weight loss 01/22/2011  . COUGH 09/01/2010  . Multiple sclerosis ([place])   . HOCM (hypertrophic obstructive cardiomyopathy) ([place])   . Right bundle branch block   . Complete heart block ([place]) 12/05/2007    Arliss Journey, PT, DPT  09/05/2020, 11:54 AM  [facility] [address] [place], [place], [address] Phone: [phone]   Fax:  [phone]  Name: Rodney Adkins MRN: [id number] Date of Birth: 1955/11/01

## 2020-09-10 ENCOUNTER — Encounter: Payer: BC Managed Care – PPO | Admitting: Occupational Therapy

## 2020-09-10 ENCOUNTER — Ambulatory Visit: Payer: BC Managed Care – PPO | Admitting: Physical Therapy

## 2020-09-12 ENCOUNTER — Encounter: Payer: Self-pay | Admitting: Occupational Therapy

## 2020-09-12 ENCOUNTER — Ambulatory Visit: Payer: BC Managed Care – PPO

## 2020-09-12 ENCOUNTER — Ambulatory Visit (INDEPENDENT_AMBULATORY_CARE_PROVIDER_SITE_OTHER): Payer: BC Managed Care – PPO

## 2020-09-12 ENCOUNTER — Other Ambulatory Visit: Payer: Self-pay

## 2020-09-12 ENCOUNTER — Ambulatory Visit: Payer: BC Managed Care – PPO | Admitting: Occupational Therapy

## 2020-09-12 DIAGNOSIS — M6281 Muscle weakness (generalized): Secondary | ICD-10-CM

## 2020-09-12 DIAGNOSIS — R2689 Other abnormalities of gait and mobility: Secondary | ICD-10-CM

## 2020-09-12 DIAGNOSIS — I442 Atrioventricular block, complete: Secondary | ICD-10-CM | POA: Diagnosis not present

## 2020-09-12 DIAGNOSIS — I69351 Hemiplegia and hemiparesis following cerebral infarction affecting right dominant side: Secondary | ICD-10-CM

## 2020-09-12 DIAGNOSIS — R278 Other lack of coordination: Secondary | ICD-10-CM

## 2020-09-12 NOTE — Therapy (Signed)
[facility] [address] [place], [place], [address] Phone: [phone]   Fax:  [phone]  Occupational Therapy Treatment  Patient Details  Name: Rodney Adkins MRN: [id number] Date of Birth: 1955-11-23 Referring Provider (OT): Darliss Cheney, MD   Encounter Date: 09/12/2020   OT End of Session - 09/12/20 1103    Visit Number 24    Number of Visits 30   updated at renewal   Date for OT Re-Evaluation 09/27/20   at renewal   Authorization Type [place] Time Period VL:MN renewal    OT Start Time 1102    OT Stop Time 1142    OT Time Calculation (min) 40 min    Activity Tolerance Patient tolerated treatment well    Behavior During Therapy [facility] for tasks assessed/performed           Past Medical History:  Diagnosis Date  . Complete heart block [facility]) June 2009   a. Presented with hypotension/pulm edema 12/2007 when HOCM was also discovered. s/p dual chamber MDT pacemaker. Thallium stest 7/09 negative for infiltrative disease or ischemia.  Followed Dr [name]  Marland Kitchen Heart murmur   . HOCM (hypertrophic obstructive cardiomyopathy) ([place]) 2009   Discovered by ECHO when he developed CHB. Mgmt Dr Caryl Comes  and Dr [name] at [facility]. Has outflow obstruction on ECHO 2011  . Multiple sclerosis ([place]) 1998   Sxs started 1998 with L body numbness. MRI c/w MS. Started Avonex. Saw Dr [name] at [facility] and switched to Betaseron and was on for 5 yrs but developed depression and site rxns and D/C'd 2006. While on Betaseron, occassionally required solumedrol for flares.  . Pacemaker 2017  . Prostate enlargement   . Right bundle branch block   . SOB (shortness of breath)    a. PFTs 09/2010 essentially normal per pulm note (mild obst defect). b. 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  . Wenckebach    a. Decreased ex tolerance 2010/2012 - underwent stress echo to look @ effects of MV/LVOT - nothing noted; although pt had high  rate Wenckebach behavior with associated ex intol. Pacer reprogrammed with improvement in sx.    Past Surgical History:  Procedure Laterality Date  . LEFT HEART CATHETERIZATION WITH CORONARY ANGIOGRAM N/A 08/22/2013   Procedure: LEFT HEART CATHETERIZATION WITH CORONARY ANGIOGRAM;  Surgeon: [name], MD;  Location: [facility] CATH LAB;  Service: Cardiovascular;  Laterality: N/A;  . LEG TENDON SURGERY  2008   Os peroneus resection and peroneal tendon repair  . pace Agricultural engineer    . PACEMAKER INSERTION  12/2007   Complete heart block  . PACEMAKER LEAD REMOVAL Left 07/18/2015   Procedure: ATRIAL PACEMAKER LEAD EXTRACTION; ATRIAL PACEMAKER LEAD INSERTION; GENERATOR REPLACEMENT.;  Surgeon: [name], MD;  Location: [facility];  Service: Cardiovascular;  Laterality: Left;  [name] to back up  . PERIPHERAL VASCULAR CATHETERIZATION N/A 02/12/2015   Procedure: Venogram;  Surgeon: [name], MD;  Location: [facility] CV LAB;  Service: Cardiovascular;  Laterality: N/A;  . TEE WITHOUT CARDIOVERSION N/A 07/18/2015   Procedure: TRANSESOPHAGEAL ECHOCARDIOGRAM (TEE);  Surgeon: [name], MD;  Location: [facility] OR;  Service: Cardiovascular;  Laterality: N/A;    There were no vitals filed for this visit.   Subjective Assessment - 09/12/20 1103    Subjective  Pt denies any pain. Pt reports no changes    Pertinent History Fall Risk. Speech Deficits.    Limitations MS, CHB s/p  PPM, HOCM    Patient Stated Goals right hand. 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 Return to driving recommendations.    Currently in Pain? No/denies                        OT Treatments/Exercises (OP) - 09/12/20 1105      Functional Reaching Activities   High Level pt placed resistance clothespins 1-8# on elevated surface at top of mirror on large pegboard and removed with RUE for shoulder strengthening an dcondition and dynamic standing balance. Pt did well with no reports of  pain and no LOB      Fine Motor Coordination (Hand/Wrist)   Fine Motor Coordination Grooved pegs;Manipulation of small objects    Manipulation of small objects using black resistance clothespin to pick up jacks with emphasis on tripod pinch strengthening (3-jaw chuck)    Grooved pegs placing and removing with tweezers with RUE requiring increased time with moderate drops and difficulty - pt started off well but as fatigue set in had increased drops                    OT Short Term Goals - 08/23/20 1104      OT SHORT TERM GOAL #1   Title Pt will be independent with HEP 06/21/20    Time 4    Period Weeks    Status Achieved    Target Date 06/21/20      OT SHORT TERM GOAL #2   Title Pt will demonstrate improved fine motor coordination by demonstrating decrease in 9 hole peg test score by 6 seconds with RUE.    Baseline RUE 71.06    Time 4    Period Weeks    Status Achieved      OT SHORT TERM GOAL #3   Title Pt will perform simple warm meal prep or home management task with supervision and good safety awareness.    Time 4    Period Weeks    Status Achieved      OT SHORT TERM GOAL #4   Title Pt will write 2-3 sentences with 75% legibility and increased size of letters (mild micrographia).    Baseline severe micrographia    Time 4    Period Weeks    Status Achieved   3 sentences with good letter size and legibility     OT SHORT TERM GOAL #5   Title Pt will demonstrate increase in grip strength by 5 lbs in RUE for increased ease with ADLs and clothing management, etc.    Baseline RUE 45.4 (LUE 65.6)    Time 4    Period Weeks    Status Achieved   RUE 55.7 lbs     OT SHORT TERM GOAL #6   Title Pt will report using RUE (dominant hand) for 25% or more of functional tasks.    Time 4    Period Weeks    Status Achieved             OT Long Term Goals - 09/12/20 1140      OT LONG TERM GOAL #1   Title Pt will be independent with updated HEP 08/27/20    Time 4     Period Weeks    Status Achieved      OT LONG TERM GOAL #2   Title *Pt will demonstrate improved fine motor coordination by demonstrating decrease in 9 hole peg test  score to 26 seconds or less with RUE.    Baseline at renewal RUE 30.94 seconds    Time 4    Period Weeks    Status On-going   08/23/2020 renewal - 30.94     OT LONG TERM GOAL #3   Title *Pt will perform work simulated tasks (typing, using mouse, etc) with RUE as dominant hand with 90% accuracy.    Time 4    Period Weeks    Status On-going   pt reports approx 70% to baseline.     OT LONG TERM GOAL #4   Title Pt will report completing at least 1 home management task per day with mod I in order to decrease caregiver burden.    Time 8    Period Weeks    Status Achieved      OT LONG TERM GOAL #5   Title Pt will improve grip strength by at least 10 lbs in RUE for strengthening dominant hand for functional use and tasks.    Baseline RUE 45.4 (LUE 65.6)    Time 8    Period Weeks    Status Achieved   61 RUE at renewal 08/23/20     OT LONG TERM GOAL #6   Title Pt will report using RUE (dominant hand) for at least 75% of functional tasks per day.    Time 8    Period Weeks    Status Achieved      OT LONG TERM GOAL #7   Title Pt will perform physical and cognitive tasks simultaneously with 95% accuracy for returning to prior tasks.    Time 8    Period Weeks    Status Achieved   100%     OT LONG TERM GOAL #8   Title Pt will write 3-5 sentences with 90% legibility and appropriate sizing.    Time 8    Period Weeks    Status Achieved   still has fatigue but much improved and takes increased time.     OT LONG TERM GOAL  #9   TITLE Pt will be independent with upgraded HEP for targeting proximal strength in RUE.    Time 4    Period Weeks    Status On-going      OT LONG TERM GOAL  #10   TITLE *Pt will improve grip strength by at least 5 lbs in RUE for strengthening dominant hand for functional use and tasks.    Baseline  renewal 61 lbs RUE    Time 4    Period Weeks    Status Achieved   69.8lbs 09/12/20                Plan - 09/12/20 1125    Clinical Impression Statement Pt progressing towards goals. Pt continues to have deficits with coordination and grip strength but making progress.    OT Occupational Profile and History Detailed Assessment- Review of Records and additional review of physical, cognitive, psychosocial history related to current functional performance    Occupational performance deficits (Please refer to evaluation for details): IADL's;ADL's;Social Participation;Leisure;Work;Rest and Sleep    Body Structure / Function / Physical Skills ADL;Dexterity;Decreased knowledge of use of DME;Strength;UE functional use;Endurance;IADL;Coordination;FMC    Rehab Potential Good    Clinical Decision Making Limited treatment options, no task modification necessary    Comorbidities Affecting Occupational Performance: None    Modification or Assistance to Complete Evaluation  No modification of tasks or assist necessary to complete eval    OT  Frequency 2x / week    OT Duration 4 weeks   or 8 visits from renewal 07/30/20   OT Treatment/Interventions Self-care/ADL training;DME and/or AE instruction;Gait Training;Therapeutic activities;Balance training;Cognitive remediation/compensation;Therapeutic exercise;Neuromuscular education;Passive range of motion;Visual/perceptual remediation/compensation;Patient/family education;Energy conservation    Plan coordination, proximal strength, upgrade HEP for proximal strength    OT Home Exercise Plan coordination HEP    Consulted and Agree with Plan of Care Patient;Family member/caregiver   spouse   Family Member Consulted spouse           Patient will benefit from skilled therapeutic intervention in order to improve the following deficits and impairments:   Body Structure / Function / Physical Skills: ADL,Dexterity,Decreased knowledge of use of DME,Strength,UE  functional use,Endurance,IADL,Coordination,FMC       Visit Diagnosis: Muscle weakness (generalized)  Other lack of coordination  Hemiplegia and hemiparesis following cerebral infarction affecting right dominant side ([facility])  Other abnormalities of gait and mobility    Problem List Patient Active Problem List   Diagnosis Date Noted  . Stroke ([place]) 05/16/2020  . Acute focal neurological deficit 05/15/2020  . BPH (benign prostatic hyperplasia) 02/02/2016  . Fractured atrial pacemaker lead wire 07/18/2015  . 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 Chest pain 08/02/2013  . SOB (shortness of breath) 08/02/2013  . PVC (premature ventricular contraction) 02/13/2013  . Chest pain on exertion 02/25/2012  . Pacemaker-dual-chamber Medtronic 04/29/2011  . Change in bowel habits 01/22/2011  . Weight loss 01/22/2011  . COUGH 09/01/2010  . Multiple sclerosis ([place])   . HOCM (hypertrophic obstructive cardiomyopathy) ([place])   . Right bundle branch block   . Complete heart block ([place]) 12/05/2007    Zachery Conch MOT, OTR/L  09/12/2020, 1:11 PM  [facility] [address] [place], [place], [address] Phone: [phone]   Fax:  [phone]  Name: Rodney Adkins MRN: [id number] Date of Birth: 1956/03/03

## 2020-09-12 NOTE — Patient Instructions (Signed)
Access Code: [email or web address] URL: [email or web address] Date: 09/12/2020 Prepared by: Cherly Anderson  Exercises Mini Squat with Counter Support - 2 x daily - 5 x weekly - 2 sets - 10 reps Standing Marching - 2 x daily - 5 x weekly - 3 sets Tandem Stance - 2 x daily - 5 x weekly - 1 sets - 3 reps - 20 sec hold Alternating tapping cup at counter - 1 x daily - 5 x weekly - 2 sets - 10 reps Standing Knee Flexion AROM with Chair Support - 2 x daily - 5 x weekly - 1 sets - 10 reps Prone Knee Flexion - 1 x daily - 5 x weekly - 2 sets - 10 reps Standing Balance with Eyes Closed on Foam - 1 x daily - 5 x weekly - 3 sets - 30 hold Scapular Retraction with Resistance - 2 x daily - 5 x weekly - 2 sets - 10 reps

## 2020-09-12 NOTE — Therapy (Signed)
[facility] [address] [place], [place], [address] Phone: [phone]   Fax:  [phone]  Physical Therapy Treatment  Patient Details  Name: Rodney Adkins MRN: [id number] Date of Birth: [birth year] Referring Provider (PT): Otto Herb, MD   Encounter Date: 09/12/2020   PT End of Session - 09/12/20 1021    Visit Number 30    Number of Visits 33    Authorization Type BCBS PPO - FOTO patient    PT Start Time 9767    PT Stop Time 1058    PT Time Calculation (min) 40 min    Equipment Utilized During Treatment Gait belt    Activity Tolerance Patient tolerated treatment well    Behavior During Therapy WFL for tasks assessed/performed           Past Medical History:  Diagnosis Date  . Complete heart block [facility]) June 2009   a. Presented with hypotension/pulm edema 12/2007 when HOCM was also discovered. s/p dual chamber MDT pacemaker. Thallium stest 7/09 negative for infiltrative disease or ischemia.  Followed Dr [name]  Marland Kitchen Heart murmur   . HOCM (hypertrophic obstructive cardiomyopathy) ([place]) 2009   Discovered by ECHO when he developed CHB. Mgmt Dr Caryl Comes  and Dr [name] at [facility]. Has outflow obstruction on ECHO 2011  . Multiple sclerosis ([place]) 1998   Sxs started 1998 with L body numbness. MRI c/w MS. Started Avonex. Saw Dr [name] at [facility] and switched to Betaseron and was on for 5 yrs but developed depression and site rxns and D/C'd 2006. While on Betaseron, occassionally required solumedrol for flares.  . Pacemaker 2017  . Prostate enlargement   . Right bundle branch block   . SOB (shortness of breath)    a. PFTs 09/2010 essentially normal per pulm note (mild obst defect). b. 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  . 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  Pacer reprogrammed with improvement in sx.    Past Surgical History:  Procedure Laterality Date  . LEFT HEART CATHETERIZATION WITH CORONARY ANGIOGRAM N/A 08/22/2013   Procedure: LEFT HEART CATHETERIZATION WITH CORONARY ANGIOGRAM;  Surgeon: [name], MD;  Location: [facility] CATH LAB;  Service: Cardiovascular;  Laterality: N/A;  . LEG TENDON SURGERY  2008   Os peroneus resection and peroneal tendon repair  . pace Agricultural engineer    . PACEMAKER INSERTION  12/2007   Complete heart block  . PACEMAKER LEAD REMOVAL Left 07/18/2015   Procedure: ATRIAL PACEMAKER LEAD EXTRACTION; ATRIAL PACEMAKER LEAD INSERTION; GENERATOR REPLACEMENT.;  Surgeon: [name], MD;  Location: [facility];  Service: Cardiovascular;  Laterality: Left;  [name] to back up  . PERIPHERAL VASCULAR CATHETERIZATION N/A 02/12/2015   Procedure: Venogram;  Surgeon: [name], MD;  Location: [facility] CV LAB;  Service: Cardiovascular;  Laterality: N/A;  . TEE WITHOUT CARDIOVERSION N/A 07/18/2015   Procedure: TRANSESOPHAGEAL ECHOCARDIOGRAM (TEE);  Surgeon: [name], MD;  Location: [facility] OR;  Service: Cardiovascular;  Laterality: N/A;    There were no vitals filed for this visit.   Subjective Assessment - 09/12/20 1021    Subjective Pt reports that he is doing well. Is having neck pain and noticed more since hospitalization. Reports they did an MRI on neck when was there and did notice some degeneration. PT suggesting that we finish  out course of neuro rehab and then have pt reach out to MD to get referral for neck pain to follow with ortho PT clinic.    Pertinent History MS, CHB s/p PPM, HOCM    Limitations Walking;Writing;Standing;Lifting;House hold activities    Patient Stated Goals wants to be able to walk better    Currently in Pain? Yes    Pain Score 3     Pain Location Neck    Pain Orientation Posterior    Pain Descriptors / Indicators Aching    Pain Type Chronic pain    Pain Onset More than a month ago    Pain Frequency  Intermittent    Aggravating Factors  worse with laying down                             [facility] Adult PT Treatment/Exercise - 09/12/20 1025      Ambulation/Gait   Ambulation/Gait Yes    Ambulation/Gait Assistance 5: Supervision    Ambulation/Gait Assistance Details around in clinic during session. Pt walks in/out of session with walking stick.    Assistive device None    Gait Pattern Step-through pattern;Decreased arm swing - right;Decreased hip/knee flexion - right    Ambulation Surface Level;Indoor    Stairs Yes    Stairs Assistance 4: Min guard    Stairs Assistance Details (indicate cue type and reason) Pt slower with descent    Stair Management Technique No rails;Alternating pattern    Number of Stairs 8    Height of Stairs 6    Curb 5: Supervision    Curb Details (indicate cue type and reason) x 3 without AD. Pt able to perform up leading with either foot. Cues to be sure he clears right toes on step when it is trailing.      Neuro Re-ed    Neuro Re-ed Details  Gait over obstacles: reciprocal steps over 6 varied height hurdles with  x 4 bouts CGA, floor ladder reciprocal steps trying to incresae cadence throughout with verbal cues to relax arms for more arm swing then repeated with side stepping x 2 laps each direction again with cues to try to increase cadence. In // bars: rockerboard positioned ant/post: stepping up with RLE x 10 and getting balance then performing with stepping up with RLE and tapping cone in from the LLE to increase right SLS time x 10 CGA. Standing on rockerboard holding 1.1# med ball in front raising it overhead x 10 with cues to keep core tight then performed bilateral scapular retraction x 10 with red theraband with cues to keep core tight to help with balance.                  PT Education - 09/12/20 1354    Education Details Added scapular retraction for posture. Discussed anchoring red theraband in door jam.    Person(s) Educated  Patient    Methods Explanation;Demonstration;Handout    Comprehension Verbalized understanding;Returned demonstration            PT Short Term Goals - 08/23/20 1154      PT SHORT TERM GOAL #1   Title STGs=LTGs             PT Long Term Goals - 08/23/20 1150      PT LONG TERM GOAL #1   Title Pt will be independent with progressive HEP. ALL LTGS DUE 08/23/20    Baseline added exercises with ankle  weights to HEP, pt will benefit from on-going additions    Time 4    Period Weeks    Status On-going    Target Date 09/22/20      PT LONG TERM GOAL #2   Title Pt will increase 30 sec sit to stand from 10 to 12 or more for improved functional strength.    Baseline 08/23/20 10 from chair without hands    Time 4    Period Weeks    Status New    Target Date 09/22/20      PT LONG TERM GOAL #3   Title Pt will increase FGA from 22/30 to >24/30 for improved balance and gait safety.    Baseline 08/20/20 22/30    Time 4    Period Weeks    Status New    Target Date 09/22/20      PT LONG TERM GOAL #4   Title Pt will ambulate on outdoor/nonlevel surfaces with walking stick versus no AD mod I for improved safety with community mobility and walking in garden.    Time 4    Period Weeks    Status New    Target Date 09/22/20                 Plan - 09/12/20 1021    Clinical Impression Statement PT continued to work on reciprocal stepping over obstacles and negotiating steps. Pt doing better with right hip flexion during activities. Added in postural exercise for scapular retraction but recommending pt get order for ortho PT to address neck pain when finishes next week in neuro.    Personal Factors and Comorbidities Comorbidity 3+;Age    Comorbidities MS, CHB s/p PPM, HOCM    Examination-Activity Limitations Squat;Locomotion Level;Transfers;Stairs;Stand    Examination-Participation Restrictions Yard Work;Occupation;Driving;Community Activity    Stability/Clinical Decision Making  Evolving/Moderate complexity    Rehab Potential Good    PT Frequency 2x / week    PT Duration 4 weeks    PT Treatment/Interventions Gait training;Stair training;Functional mobility training;Therapeutic activities;Therapeutic exercise;Balance training;Neuromuscular re-education;Patient/family education;Orthotic Fit/Training;DME Instruction;ADLs/Self Care Home Management;Energy conservation;Manual techniques;Vestibular    PT Next Visit Plan Continue with dynamic gait activities with and without walking stick on varied surfaces, high level balance and right hip strengthening. gait outdoors if weather permits. Start checking LTGs. Plan to d/c end of next week.    Consulted and Agree with Plan of Care Patient           Patient will benefit from skilled therapeutic intervention in order to improve the following deficits and impairments:  Abnormal gait,Decreased coordination,Difficulty walking,Decreased endurance,Decreased activity tolerance,Impaired perceived functional ability,Decreased balance,Decreased mobility,Decreased strength,Improper body mechanics  Visit Diagnosis: Other abnormalities of gait and mobility  Muscle weakness (generalized)  Hemiplegia and hemiparesis following cerebral infarction affecting right dominant side [facility])     Problem List Patient Active Problem List   Diagnosis Date Noted  . Stroke ([place]) 05/16/2020  . Acute focal neurological deficit 05/15/2020  . BPH (benign prostatic hyperplasia) 02/02/2016  . Fractured atrial pacemaker lead wire 07/18/2015  . 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 Chest pain 08/02/2013  . SOB (shortness of breath) 08/02/2013  . PVC (premature ventricular contraction) 02/13/2013  . Chest pain on exertion 02/25/2012  . Pacemaker-dual-chamber Medtronic 04/29/2011  . Change in bowel habits 01/22/2011  . Weight loss 01/22/2011  . COUGH 09/01/2010  . Multiple sclerosis ([place])   . HOCM (hypertrophic obstructive cardiomyopathy) ([place])   .  Right bundle branch block   .  Complete heart block ([place]) 12/05/2007    Electa Sniff, PT, DPT, NCS 09/12/2020, 1:58 PM  [facility] [address] [place], [place], [address] Phone: [phone]   Fax:  [phone]  Name: Rodney Adkins MRN: [id number] Date of Birth: 1955/12/23

## 2020-09-13 ENCOUNTER — Ambulatory Visit: Payer: BC Managed Care – PPO

## 2020-09-15 LAB — CUP PACEART REMOTE DEVICE CHECK
Battery Remaining Longevity: 50 mo
Battery Voltage: 2.99 V
Brady Statistic AP VP Percent: 12.64 %
Brady Statistic AP VS Percent: 0 %
Brady Statistic AS VP Percent: 87.35 %
Brady Statistic AS VS Percent: 0.01 %
Brady Statistic RA Percent Paced: 12.64 %
Brady Statistic RV Percent Paced: 99.98 %
Date Time Interrogation Session: 20220311153700
Implantable Lead Implant Date: 20090608
Implantable Lead Implant Date: 20170112
Implantable Lead Location: 753859
Implantable Lead Location: 753860
Implantable Lead Model: 5076
Implantable Lead Model: 5076
Implantable Pulse Generator Implant Date: 20170112
Lead Channel Impedance Value: 323 Ohm
Lead Channel Impedance Value: 380 Ohm
Lead Channel Impedance Value: 418 Ohm
Lead Channel Impedance Value: 513 Ohm
Lead Channel Pacing Threshold Amplitude: 0.625 V
Lead Channel Pacing Threshold Amplitude: 0.875 V
Lead Channel Pacing Threshold Pulse Width: 0.4 ms
Lead Channel Pacing Threshold Pulse Width: 0.4 ms
Lead Channel Sensing Intrinsic Amplitude: 19.375 mV
Lead Channel Sensing Intrinsic Amplitude: 19.375 mV
Lead Channel Sensing Intrinsic Amplitude: 2.5 mV
Lead Channel Sensing Intrinsic Amplitude: 2.5 mV
Lead Channel Setting Pacing Amplitude: 2 V
Lead Channel Setting Pacing Amplitude: 2.5 V
Lead Channel Setting Pacing Pulse Width: 0.4 ms
Lead Channel Setting Sensing Sensitivity: 5.6 mV

## 2020-09-17 ENCOUNTER — Encounter: Payer: Self-pay | Admitting: Occupational Therapy

## 2020-09-17 ENCOUNTER — Ambulatory Visit: Payer: BC Managed Care – PPO | Admitting: Physical Therapy

## 2020-09-17 ENCOUNTER — Ambulatory Visit: Payer: BC Managed Care – PPO | Admitting: Occupational Therapy

## 2020-09-17 ENCOUNTER — Encounter: Payer: Self-pay | Admitting: Physical Therapy

## 2020-09-17 ENCOUNTER — Other Ambulatory Visit: Payer: Self-pay

## 2020-09-17 ENCOUNTER — Ambulatory Visit: Payer: BC Managed Care – PPO

## 2020-09-17 DIAGNOSIS — R41840 Attention and concentration deficit: Secondary | ICD-10-CM

## 2020-09-17 DIAGNOSIS — M6281 Muscle weakness (generalized): Secondary | ICD-10-CM

## 2020-09-17 DIAGNOSIS — R2689 Other abnormalities of gait and mobility: Secondary | ICD-10-CM

## 2020-09-17 DIAGNOSIS — R278 Other lack of coordination: Secondary | ICD-10-CM

## 2020-09-17 DIAGNOSIS — I69351 Hemiplegia and hemiparesis following cerebral infarction affecting right dominant side: Secondary | ICD-10-CM

## 2020-09-17 NOTE — Therapy (Signed)
[facility] [address] [place], [place], [address] Phone: [phone]   Fax:  [phone]  Physical Therapy Treatment  Patient Details  Name: Rodney Adkins MRN: [id number] Date of Birth: 04-20-1956 Referring Provider (PT): Otto Herb, MD   Encounter Date: 09/17/2020   PT End of Session - 09/17/20 1410    Visit Number 31    Number of Visits 33    Authorization Type BCBS PPO - FOTO patient    PT Start Time 9326   PT running late from previous pt   PT Stop Time 1315    PT Time Calculation (min) 39 min    Equipment Utilized During Treatment Gait belt    Activity Tolerance Patient tolerated treatment well    Behavior During Therapy WFL for tasks assessed/performed           Past Medical History:  Diagnosis Date  . Complete heart block [facility]) June 2009   a. Presented with hypotension/pulm edema 12/2007 when HOCM was also discovered. s/p dual chamber MDT pacemaker. Thallium stest 7/09 negative for infiltrative disease or ischemia.  Followed Dr [name]  Marland Kitchen Heart murmur   . HOCM (hypertrophic obstructive cardiomyopathy) ([place]) 2009   Discovered by ECHO when he developed CHB. Mgmt Dr Caryl Comes  and Dr [name] at [facility]. Has outflow obstruction on ECHO 2011  . Multiple sclerosis ([place]) 1998   Sxs started 1998 with L body numbness. MRI c/w MS. Started Avonex. Saw Dr [name] at [facility] and switched to Betaseron and was on for 5 yrs but developed depression and site rxns and D/C'd 2006. While on Betaseron, occassionally required solumedrol for flares.  . Pacemaker 2017  . Prostate enlargement   . Right bundle branch block   . SOB (shortness of breath)    a. PFTs 09/2010 essentially normal per pulm note (mild obst defect). b. 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  . Wenckebach    a. Decreased ex tolerance 2010/2012 - underwent stress echo to look @ effects of MV/LVOT - nothing noted; although pt had high rate Wenckebach  behavior with associated ex intol. Pacer reprogrammed with improvement in sx.    Past Surgical History:  Procedure Laterality Date  . LEFT HEART CATHETERIZATION WITH CORONARY ANGIOGRAM N/A 08/22/2013   Procedure: LEFT HEART CATHETERIZATION WITH CORONARY ANGIOGRAM;  Surgeon: [name], MD;  Location: [facility] CATH LAB;  Service: Cardiovascular;  Laterality: N/A;  . LEG TENDON SURGERY  2008   Os peroneus resection and peroneal tendon repair  . pace Agricultural engineer    . PACEMAKER INSERTION  12/2007   Complete heart block  . PACEMAKER LEAD REMOVAL Left 07/18/2015   Procedure: ATRIAL PACEMAKER LEAD EXTRACTION; ATRIAL PACEMAKER LEAD INSERTION; GENERATOR REPLACEMENT.;  Surgeon: [name], MD;  Location: [facility];  Service: Cardiovascular;  Laterality: Left;  [name] to back up  . PERIPHERAL VASCULAR CATHETERIZATION N/A 02/12/2015   Procedure: Venogram;  Surgeon: [name], MD;  Location: [facility] CV LAB;  Service: Cardiovascular;  Laterality: N/A;  . TEE WITHOUT CARDIOVERSION N/A 07/18/2015   Procedure: TRANSESOPHAGEAL ECHOCARDIOGRAM (TEE);  Surgeon: [name], MD;  Location: [facility] OR;  Service: Cardiovascular;  Laterality: N/A;    There were no vitals filed for this visit.   Subjective Assessment - 09/17/20 1242    Subjective Still feels like his walking isn't as fluid. Getting in and out of the tub shower is getting better. Pt asking about info for ortho clinic.  Pertinent History MS, CHB s/p PPM, HOCM    Limitations Walking;Writing;Standing;Lifting;House hold activities    Patient Stated Goals wants to be able to walk better    Currently in Pain? No/denies    Pain Onset More than a month ago              [facility] PT Assessment - 09/17/20 1249      Functional Gait  Assessment   Gait assessed  Yes    Gait Level Surface Walks 20 ft in less than 7 sec but greater than 5.5 sec, uses assistive device, slower speed, mild gait deviations, or deviates 6-10 in outside of the 12 in walkway  width.    Change in Gait Speed Able to change speed, demonstrates mild gait deviations, deviates 6-10 in outside of the 12 in walkway width, or no gait deviations, unable to achieve a major change in velocity, or uses a change in velocity, or uses an assistive device.    Gait with Horizontal Head Turns Performs head turns smoothly with slight change in gait velocity (eg, minor disruption to smooth gait path), deviates 6-10 in outside 12 in walkway width, or uses an assistive device.    Gait with Vertical Head Turns Performs head turns with no change in gait. Deviates no more than 6 in outside 12 in walkway width.    Gait and Pivot Turn Pivot turns safely within 3 sec and stops quickly with no loss of balance.    Step Over Obstacle Is able to step over 2 stacked shoe boxes taped together (9 in total height) without changing gait speed. No evidence of imbalance.    Gait with Narrow Base of Support Is able to ambulate for 10 steps heel to toe with no staggering.    Gait with Eyes Closed Walks 20 ft, slow speed, abnormal gait pattern, evidence for imbalance, deviates 10-15 in outside 12 in walkway width. Requires more than 9 sec to ambulate 20 ft.    Ambulating Backwards Walks 20 ft, uses assistive device, slower speed, mild gait deviations, deviates 6-10 in outside 12 in walkway width.    Steps Alternating feet, must use rail.    Total Score 23    FGA comment: 23/30 = medium fall risk                         [facility] Adult PT Treatment/Exercise - 09/17/20 1248      Transfers   Transfers Sit to Stand;Stand to Sit    Sit to Stand 7: Independent    Stand to Sit 7: Independent    Comments 30 second chair stand: 13 sit <> stands without UE support      Ambulation/Gait   Ambulation/Gait Yes    Ambulation/Gait Assistance 6: Modified independent (Device/Increase time);5: Supervision;4: Min guard    Ambulation/Gait Assistance Details ambulated with walking stick over grass, pt able to  perform with mod I, with no AD pt needing supervision/min guard for safety over grass    Ambulation Distance (Feet) 500 Feet    Assistive device None   single walking stick   Gait Pattern Step-through pattern;Decreased arm swing - right;Decreased hip/knee flexion - right    Ambulation Surface Level;Indoor;Unlevel;Outdoor;Paved;Grass    Stairs Yes    Stairs Assistance 4: Min guard    Stairs Assistance Details (indicate cue type and reason) pt with slower descent, needing min guard for balance    Stair Management Technique No rails;Alternating pattern  Number of Stairs 8    Height of Stairs 6                  PT Education - 09/17/20 1410    Education Details progress towards goals, D/C at next session, pt to then get a referral to ortho PT for neck pain (gave pt address and phone number of clinic [address]).discussed making sure pt has adeuqate night lights/light when getting up to use the bathroom at night with walking stick    Person(s) Educated Patient    Methods Explanation;Handout    Comprehension Verbalized understanding            PT Short Term Goals - 08/23/20 1154      PT SHORT TERM GOAL #1   Title STGs=LTGs             PT Long Term Goals - 09/17/20 1248      PT LONG TERM GOAL #1   Title Pt will be independent with progressive HEP. ALL LTGS DUE 08/23/20    Baseline added exercises with ankle weights to HEP, pt will benefit from on-going additions    Time 4    Period Weeks    Status On-going      PT LONG TERM GOAL #2   Title Pt will increase 30 sec sit to stand from 10 to 12 or more for improved functional strength.    Baseline 08/23/20 10 from chair without hands, 09/17/20: 13 without UE support    Time 4    Period Weeks    Status Achieved      PT LONG TERM GOAL #3   Title Pt will increase FGA from 22/30 to >24/30 for improved balance and gait safety.    Baseline 08/20/20 22/30, 23/30 (improved by 1 point) on 09/17/20    Time 4    Period Weeks     Status Not Met      PT LONG TERM GOAL #4   Title Pt will ambulate on outdoor/nonlevel surfaces with walking stick versus no AD mod I for improved safety with community mobility and walking in garden.    Baseline uses walking stick on pavement/grass with mod I, needs supervision/intermittent min guard with no AD on grass, supervision on pavement.    Time 4    Period Weeks    Status Partially Met                 Plan - 09/17/20 1419    Clinical Impression Statement Began to assess LTGs today for anticipated D/C this week. Pt improved FGA score to 23/30 (previously 22/30), but not to goal level, putting pt at a moderate risk of falls without AD. Pt able to ambulate over unlevel surfaces outdoors with walking stick with mod I, but needs supervision/occassional min guard for balance with no AD. Pt improved 30 second chair stand with no UE support to 13 (previously was 10). Will review and finalize HEP at next visit.    Personal Factors and Comorbidities Comorbidity 3+;Age    Comorbidities MS, CHB s/p PPM, HOCM    Examination-Activity Limitations Squat;Locomotion Level;Transfers;Stairs;Stand    Examination-Participation Restrictions Yard Work;Occupation;Driving;Community Activity    Stability/Clinical Decision Making Evolving/Moderate complexity    Rehab Potential Good    PT Frequency 2x / week    PT Duration 4 weeks    PT Treatment/Interventions Gait training;Stair training;Functional mobility training;Therapeutic activities;Therapeutic exercise;Balance training;Neuromuscular re-education;Patient/family education;Orthotic Fit/Training;DME Instruction;ADLs/Self Care Home Management;Energy conservation;Manual techniques;Vestibular    PT  Next Visit Plan review and finalize HEP. D/C. pt asking about another exercise with eyes closed (may see if safe to do walking with EC at countertop)    Consulted and Agree with Plan of Care Patient           Patient will benefit from skilled  therapeutic intervention in order to improve the following deficits and impairments:  Abnormal gait,Decreased coordination,Difficulty walking,Decreased endurance,Decreased activity tolerance,Impaired perceived functional ability,Decreased balance,Decreased mobility,Decreased strength,Improper body mechanics  Visit Diagnosis: Muscle weakness (generalized)  Other lack of coordination  Hemiplegia and hemiparesis following cerebral infarction affecting right dominant side (HCC)  Other abnormalities of gait and mobility     Problem List Patient Active Problem List   Diagnosis Date Noted  . Stroke ([place]) 05/16/2020  . Acute focal neurological deficit 05/15/2020  . BPH (benign prostatic hyperplasia) 02/02/2016  . Fractured atrial pacemaker lead wire 07/18/2015  . 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 Chest pain 08/02/2013  . SOB (shortness of breath) 08/02/2013  . PVC (premature ventricular contraction) 02/13/2013  . Chest pain on exertion 02/25/2012  . Pacemaker-dual-chamber Medtronic 04/29/2011  . Change in bowel habits 01/22/2011  . Weight loss 01/22/2011  . COUGH 09/01/2010  . Multiple sclerosis ([place])   . HOCM (hypertrophic obstructive cardiomyopathy) ([place])   . Right bundle branch block   . Complete heart block ([place]) 12/05/2007    Arliss Journey, PT, DPT  09/17/2020, 2:25 PM  [facility] [address] [place], [place], [address] Phone: [phone]   Fax:  [phone]  Name: Rodney Adkins MRN: [id number] Date of Birth: 11/14/1955

## 2020-09-17 NOTE — Therapy (Signed)
[facility] [address] [place], [place], [address] Phone: [phone]   Fax:  [phone]  Occupational Therapy Treatment  Patient Details  Name: Rodney Adkins MRN: [id number] Date of Birth: Nov 18, 1955 Referring Provider (OT): Darliss Cheney, MD   Encounter Date: 09/17/2020   OT End of Session - 09/17/20 1150    Visit Number 25    Number of Visits 30   updated at renewal   Date for OT Re-Evaluation 09/27/20   at renewal   Authorization Type [place] Time Period VL:MN renewal    OT Start Time 1148    OT Stop Time 1230    OT Time Calculation (min) 42 min    Activity Tolerance Patient tolerated treatment well    Behavior During Therapy [facility] for tasks assessed/performed           Past Medical History:  Diagnosis Date  . Complete heart block [facility]) June 2009   a. Presented with hypotension/pulm edema 12/2007 when HOCM was also discovered. s/p dual chamber MDT pacemaker. Thallium stest 7/09 negative for infiltrative disease or ischemia.  Followed Dr [name]  Marland Kitchen Heart murmur   . HOCM (hypertrophic obstructive cardiomyopathy) ([place]) 2009   Discovered by ECHO when he developed CHB. Mgmt Dr Caryl Comes  and Dr [name] at [facility]. Has outflow obstruction on ECHO 2011  . Multiple sclerosis ([place]) 1998   Sxs started 1998 with L body numbness. MRI c/w MS. Started Avonex. Saw Dr [name] at [facility] and switched to Betaseron and was on for 5 yrs but developed depression and site rxns and D/C'd 2006. While on Betaseron, occassionally required solumedrol for flares.  . Pacemaker 2017  . Prostate enlargement   . Right bundle branch block   . SOB (shortness of breath)    a. PFTs 09/2010 essentially normal per pulm note (mild obst defect). b. 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  . Wenckebach    a. Decreased ex tolerance 2010/2012 - underwent stress echo to look @ effects of MV/LVOT - nothing noted; although pt had high  rate Wenckebach behavior with associated ex intol. Pacer reprogrammed with improvement in sx.    Past Surgical History:  Procedure Laterality Date  . LEFT HEART CATHETERIZATION WITH CORONARY ANGIOGRAM N/A 08/22/2013   Procedure: LEFT HEART CATHETERIZATION WITH CORONARY ANGIOGRAM;  Surgeon: [name], MD;  Location: [facility] CATH LAB;  Service: Cardiovascular;  Laterality: N/A;  . LEG TENDON SURGERY  2008   Os peroneus resection and peroneal tendon repair  . pace Agricultural engineer    . PACEMAKER INSERTION  12/2007   Complete heart block  . PACEMAKER LEAD REMOVAL Left 07/18/2015   Procedure: ATRIAL PACEMAKER LEAD EXTRACTION; ATRIAL PACEMAKER LEAD INSERTION; GENERATOR REPLACEMENT.;  Surgeon: [name], MD;  Location: [facility];  Service: Cardiovascular;  Laterality: Left;  [name] to back up  . PERIPHERAL VASCULAR CATHETERIZATION N/A 02/12/2015   Procedure: Venogram;  Surgeon: [name], MD;  Location: [facility] CV LAB;  Service: Cardiovascular;  Laterality: N/A;  . TEE WITHOUT CARDIOVERSION N/A 07/18/2015   Procedure: TRANSESOPHAGEAL ECHOCARDIOGRAM (TEE);  Surgeon: [name], MD;  Location: [facility] OR;  Service: Cardiovascular;  Laterality: N/A;    There were no vitals filed for this visit.   Subjective Assessment - 09/17/20 1152    Subjective  "the handwriting is still not great"    Pertinent History Fall Risk. Speech Deficits.    Limitations MS, CHB s/p PPM, HOCM  Patient Stated Goals right hand. 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 Return to driving recommendations.    Currently in Pain? No/denies               Treatment:  Handwriting: word finding for foods for every letter of the alphabet and working on increasing legibility with handwriting. Pt required verbal cues for word finding for some common words/foods. Pt then picked 5 words and wrote individual sentences for each word. Pt with increased legibility with handwriting this day but  it does continue to get smaller as he fatigues and handwriting goes on.  Medicine Ball Ex: shoulder flexion, chest press, circumduction, bicep curls, horizontal abduction with 3.3 lb medicine ball  Grooved Pegs: with RUE with min difficulty and no drops.  Resistance Clothespins  1-8# with RUE and placing on antenna for increase in shoulder conditioning and pinch strength with RUE.                OT Short Term Goals - 08/23/20 1104      OT SHORT TERM GOAL #1   Title Pt will be independent with HEP 06/21/20    Time 4    Period Weeks    Status Achieved    Target Date 06/21/20      OT SHORT TERM GOAL #2   Title Pt will demonstrate improved fine motor coordination by demonstrating decrease in 9 hole peg test score by 6 seconds with RUE.    Baseline RUE 71.06    Time 4    Period Weeks    Status Achieved      OT SHORT TERM GOAL #3   Title Pt will perform simple warm meal prep or home management task with supervision and good safety awareness.    Time 4    Period Weeks    Status Achieved      OT SHORT TERM GOAL #4   Title Pt will write 2-3 sentences with 75% legibility and increased size of letters (mild micrographia).    Baseline severe micrographia    Time 4    Period Weeks    Status Achieved   3 sentences with good letter size and legibility     OT SHORT TERM GOAL #5   Title Pt will demonstrate increase in grip strength by 5 lbs in RUE for increased ease with ADLs and clothing management, etc.    Baseline RUE 45.4 (LUE 65.6)    Time 4    Period Weeks    Status Achieved   RUE 55.7 lbs     OT SHORT TERM GOAL #6   Title Pt will report using RUE (dominant hand) for 25% or more of functional tasks.    Time 4    Period Weeks    Status Achieved             OT Long Term Goals - 09/12/20 1140      OT LONG TERM GOAL #1   Title Pt will be independent with updated HEP 08/27/20    Time 4    Period Weeks    Status Achieved      OT LONG TERM GOAL #2   Title *Pt  will demonstrate improved fine motor coordination by demonstrating decrease in 9 hole peg test score to 26 seconds or less with RUE.    Baseline at renewal RUE 30.94 seconds    Time 4    Period Weeks    Status On-going   08/23/2020  renewal - 30.94     OT LONG TERM GOAL #3   Title *Pt will perform work simulated tasks (typing, using mouse, etc) with RUE as dominant hand with 90% accuracy.    Time 4    Period Weeks    Status On-going   pt reports approx 70% to baseline.     OT LONG TERM GOAL #4   Title Pt will report completing at least 1 home management task per day with mod I in order to decrease caregiver burden.    Time 8    Period Weeks    Status Achieved      OT LONG TERM GOAL #5   Title Pt will improve grip strength by at least 10 lbs in RUE for strengthening dominant hand for functional use and tasks.    Baseline RUE 45.4 (LUE 65.6)    Time 8    Period Weeks    Status Achieved   61 RUE at renewal 08/23/20     OT LONG TERM GOAL #6   Title Pt will report using RUE (dominant hand) for at least 75% of functional tasks per day.    Time 8    Period Weeks    Status Achieved      OT LONG TERM GOAL #7   Title Pt will perform physical and cognitive tasks simultaneously with 95% accuracy for returning to prior tasks.    Time 8    Period Weeks    Status Achieved   100%     OT LONG TERM GOAL #8   Title Pt will write 3-5 sentences with 90% legibility and appropriate sizing.    Time 8    Period Weeks    Status Achieved   still has fatigue but much improved and takes increased time.     OT LONG TERM GOAL  #9   TITLE Pt will be independent with upgraded HEP for targeting proximal strength in RUE.    Time 4    Period Weeks    Status On-going      OT LONG TERM GOAL  #10   TITLE *Pt will improve grip strength by at least 5 lbs in RUE for strengthening dominant hand for functional use and tasks.    Baseline renewal 61 lbs RUE    Time 4    Period Weeks    Status Achieved   69.8lbs  09/12/20                Plan - 09/17/20 1219    Clinical Impression Statement Pt continues to progress with increased legibility and cooridnation with RUE    OT Occupational Profile and History Detailed Assessment- Review of Records and additional review of physical, cognitive, psychosocial history related to current functional performance    Occupational performance deficits (Please refer to evaluation for details): IADL's;ADL's;Social Participation;Leisure;Work;Rest and Sleep    Body Structure / Function / Physical Skills ADL;Dexterity;Decreased knowledge of use of DME;Strength;UE functional use;Endurance;IADL;Coordination;FMC    Rehab Potential Good    Clinical Decision Making Limited treatment options, no task modification necessary    Comorbidities Affecting Occupational Performance: None    Modification or Assistance to Complete Evaluation  No modification of tasks or assist necessary to complete eval    OT Frequency 2x / week    OT Duration 4 weeks   or 8 visits from renewal 07/30/20   OT Treatment/Interventions Self-care/ADL training;DME and/or AE instruction;Gait Training;Therapeutic activities;Balance training;Cognitive remediation/compensation;Therapeutic exercise;Neuromuscular education;Passive range of motion;Visual/perceptual remediation/compensation;Patient/family education;Energy conservation  Plan coordination, proximal strength    OT Home Exercise Plan coordination HEP    Consulted and Agree with Plan of Care Patient;Family member/caregiver   spouse   Family Member Consulted spouse           Patient will benefit from skilled therapeutic intervention in order to improve the following deficits and impairments:   Body Structure / Function / Physical Skills: ADL,Dexterity,Decreased knowledge of use of DME,Strength,UE functional use,Endurance,IADL,Coordination,FMC       Visit Diagnosis: Muscle weakness (generalized)  Other lack of coordination  Hemiplegia and  hemiparesis following cerebral infarction affecting right dominant side (HCC)  Other abnormalities of gait and mobility  Attention and concentration deficit    Problem List Patient Active Problem List   Diagnosis Date Noted  . Stroke ([place]) 05/16/2020  . Acute focal neurological deficit 05/15/2020  . BPH (benign prostatic hyperplasia) 02/02/2016  . Fractured atrial pacemaker lead wire 07/18/2015  . 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 Chest pain 08/02/2013  . SOB (shortness of breath) 08/02/2013  . PVC (premature ventricular contraction) 02/13/2013  . Chest pain on exertion 02/25/2012  . Pacemaker-dual-chamber Medtronic 04/29/2011  . Change in bowel habits 01/22/2011  . Weight loss 01/22/2011  . COUGH 09/01/2010  . Multiple sclerosis ([place])   . HOCM (hypertrophic obstructive cardiomyopathy) ([place])   . Right bundle branch block   . Complete heart block ([place]) 12/05/2007    Zachery Conch MOT, OTR/L  09/17/2020, 2:15 PM  [facility] [address] [place], [place], [address] Phone: [phone]   Fax:  [phone]  Name: Rodney Adkins MRN: [id number] Date of Birth: [birth year]

## 2020-09-19 ENCOUNTER — Encounter: Payer: Self-pay | Admitting: Occupational Therapy

## 2020-09-19 ENCOUNTER — Other Ambulatory Visit: Payer: Self-pay

## 2020-09-19 ENCOUNTER — Ambulatory Visit: Payer: BC Managed Care – PPO | Admitting: Occupational Therapy

## 2020-09-19 ENCOUNTER — Ambulatory Visit: Payer: BC Managed Care – PPO

## 2020-09-19 DIAGNOSIS — R41840 Attention and concentration deficit: Secondary | ICD-10-CM

## 2020-09-19 DIAGNOSIS — I69351 Hemiplegia and hemiparesis following cerebral infarction affecting right dominant side: Secondary | ICD-10-CM

## 2020-09-19 DIAGNOSIS — R2689 Other abnormalities of gait and mobility: Secondary | ICD-10-CM | POA: Diagnosis not present

## 2020-09-19 DIAGNOSIS — M6281 Muscle weakness (generalized): Secondary | ICD-10-CM

## 2020-09-19 DIAGNOSIS — R278 Other lack of coordination: Secondary | ICD-10-CM

## 2020-09-19 NOTE — Therapy (Signed)
[facility] [address] [place], [place], [address] Phone: [phone]   Fax:  [phone]  Physical Therapy Treatment/ Discharge visit  Patient Details  Name: Rodney Adkins MRN: [id number] Date of Birth: 1955-09-23 Referring Provider (PT): Otto Herb, MD  PHYSICAL THERAPY DISCHARGE SUMMARY  Visits from Start of Care: 32  Current functional level related to goals / functional outcomes: See clinical impression and goals for more information.   Remaining deficits: Moderate fall risk, right hemiparesis.   Education / Equipment: HEP, walking stick  Plan: Patient agrees to discharge.  Patient goals were partially met. Patient is being discharged due to meeting the stated rehab goals.  ?????       Encounter Date: 09/19/2020   PT End of Session - 09/19/20 1023    Visit Number 32    Number of Visits 33    Authorization Type [employer] PPO - FOTO patient    PT Start Time 1020    PT Stop Time 1100    PT Time Calculation (min) 40 min    Equipment Utilized During Treatment Gait belt    Activity Tolerance Patient tolerated treatment well    Behavior During Therapy WFL for tasks assessed/performed           Past Medical History:  Diagnosis Date  . Complete heart block [facility]) June 2009   a. Presented with hypotension/pulm edema 12/2007 when HOCM was also discovered. s/p dual chamber MDT pacemaker. Thallium stest 7/09 negative for infiltrative disease or ischemia.  Followed Dr [name]  Marland Kitchen Heart murmur   . HOCM (hypertrophic obstructive cardiomyopathy) ([place]) 2009   Discovered by ECHO when he developed CHB. Mgmt Dr Caryl Comes  and Dr [name] at [facility]. Has outflow obstruction on ECHO 2011  . Multiple sclerosis ([place]) 1998   Sxs started 1998 with L body numbness. MRI c/w MS. Started Avonex. Saw Dr [name] at [facility] and switched to Betaseron and was on for 5 yrs but developed depression and site rxns and D/C'd 2006. While on Betaseron,  occassionally required solumedrol for flares.  . Pacemaker 2017  . Prostate enlargement   . Right bundle branch block   . SOB (shortness of breath)    a. PFTs 09/2010 essentially normal per pulm note (mild obst defect). b. 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  . 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 Pacer reprogrammed with improvement in sx.    Past Surgical History:  Procedure Laterality Date  . LEFT HEART CATHETERIZATION WITH CORONARY ANGIOGRAM N/A 08/22/2013   Procedure: LEFT HEART CATHETERIZATION WITH CORONARY ANGIOGRAM;  Surgeon: [name], MD;  Location: [facility] CATH LAB;  Service: Cardiovascular;  Laterality: N/A;  . LEG TENDON SURGERY  2008   Os peroneus resection and peroneal tendon repair  . pace Agricultural engineer    . PACEMAKER INSERTION  12/2007   Complete heart block  . PACEMAKER LEAD REMOVAL Left 07/18/2015   Procedure: ATRIAL PACEMAKER LEAD EXTRACTION; ATRIAL PACEMAKER LEAD INSERTION; GENERATOR REPLACEMENT.;  Surgeon: [name], MD;  Location: [facility];  Service: Cardiovascular;  Laterality: Left;  [name] to back up  . PERIPHERAL VASCULAR CATHETERIZATION N/A 02/12/2015   Procedure: Venogram;  Surgeon: [name], MD;  Location: [facility] CV LAB;  Service: Cardiovascular;  Laterality: N/A;  . TEE WITHOUT CARDIOVERSION N/A 07/18/2015  Procedure: TRANSESOPHAGEAL ECHOCARDIOGRAM (TEE);  Surgeon: [name], MD;  Location: [facility] OR;  Service: Cardiovascular;  Laterality: N/A;    There were no vitals filed for this visit.   Subjective Assessment - 09/19/20 1023    Subjective Pt reports he is doing ok.    Pertinent History MS, CHB s/p PPM, HOCM    Limitations Walking;Writing;Standing;Lifting;House hold activities    Patient Stated Goals wants to be able to walk better    Currently in Pain? No/denies    Pain Onset  More than a month ago              [facility] PT Assessment - 09/19/20 1025      Observation/Other Assessments   Focus on Therapeutic Outcomes (FOTO)  57\                         [facility] Adult PT Treatment/Exercise - 09/19/20 1025      Ambulation/Gait   Ambulation/Gait Yes    Ambulation/Gait Assistance 6: Modified independent (Device/Increase time)    Ambulation/Gait Assistance Details around clinic without device. In and out of clinic with walking stick    Assistive device None   walking stick   Gait Pattern Step-through pattern;Decreased hip/knee flexion - right;Decreased arm swing - right    Ambulation Surface Level;Indoor      Exercises   Exercises Other Exercises    Other Exercises  PT reviewed medbridge HEP as noted below and discussed progression.           Exercises-  Mini Squat with Counter Support - 2 x daily - 5 x weekly - 2 sets - 10 reps- pt able to perform without UE support Standing Marching - 2 x daily - 5 x weekly - 3 sets- performed as walking march along counter with fingertip support with red theraband around thighs for progression with backwards walking at end with band as well. Verbal cues to bring RLE back further with backwards steps. Tandem Stance - 2 x daily - 5 x weekly - 1 sets - 3 reps - 20 sec hold Alternating tapping cup at counter - 1 x daily - 5 x weekly - 2 sets - 10 reps Standing Knee Flexion AROM with Chair Support - 2 x daily - 5 x weekly - 1 sets - 10 reps- performed with red theraband Prone Knee Flexion - 1 x daily - 5 x weekly - 2 sets - 10 reps Standing Balance with Eyes Closed on Foam - 1 x daily - 5 x weekly - 3 sets - 30 hold Scapular Retraction with Resistance - 2 x daily - 5 x weekly - 2 sets - 10 reps- performed with red theraband Walking with Eyes Closed and Counter Support - 2 x daily - 5 x weekly - 1 sets - 3-4 reps- added to HEP        PT Education - 09/19/20 1249    Education Details Discussed and reviewed HEP.  Educated on progression. Discharge today as planned and pt will follow-up with MD to get order for neck for ortho clinic.    Person(s) Educated Patient    Methods Explanation;Demonstration;Handout    Comprehension Verbalized understanding;Returned demonstration            PT Short Term Goals - 08/23/20 1154      PT SHORT TERM GOAL #1   Title STGs=LTGs             PT Long Term Goals -  09/19/20 1250      PT LONG TERM GOAL #1   Title Pt will be independent with progressive HEP. ALL LTGS DUE 08/23/20    Baseline Pt is able to perform HEP on own. Revised at discharge visit and discussed progression    Time 4    Period Weeks    Status Achieved      PT LONG TERM GOAL #2   Title Pt will increase 30 sec sit to stand from 10 to 12 or more for improved functional strength.    Baseline 08/23/20 10 from chair without hands, 09/17/20: 13 without UE support    Time 4    Period Weeks    Status Achieved      PT LONG TERM GOAL #3   Title Pt will increase FGA from 22/30 to >24/30 for improved balance and gait safety.    Baseline 08/20/20 22/30, 23/30 (improved by 1 point) on 09/17/20    Time 4    Period Weeks    Status Not Met      PT LONG TERM GOAL #4   Title Pt will ambulate on outdoor/nonlevel surfaces with walking stick versus no AD mod I for improved safety with community mobility and walking in garden.    Baseline uses walking stick on pavement/grass with mod I, needs supervision/intermittent min guard with no AD on grass, supervision on pavement.    Time 4    Period Weeks    Status Partially Met                 Plan - 09/19/20 1251    Clinical Impression Statement Pt has progressed well towards goals. He is independent with HEP and has been educated on how to progress. Met 30 sec sit to stand last session showing improving functional strength. FGA score with minimal change to 23/20 indicating moderate fall risk. Pt uses walking stick for gait currently and is mod I with this  on varied surfaces. PT will be discharging today as planned. Pt will be following up for referral for neck pain at ortho clinic.    Personal Factors and Comorbidities Comorbidity 3+;Age    Comorbidities MS, CHB s/p PPM, HOCM    Examination-Activity Limitations Squat;Locomotion Level;Transfers;Stairs;Stand    Examination-Participation Restrictions Yard Work;Occupation;Driving;Community Activity    Stability/Clinical Decision Making Evolving/Moderate complexity    Rehab Potential Good    PT Frequency 2x / week    PT Duration 4 weeks    PT Treatment/Interventions Gait training;Stair training;Functional mobility training;Therapeutic activities;Therapeutic exercise;Balance training;Neuromuscular re-education;Patient/family education;Orthotic Fit/Training;DME Instruction;ADLs/Self Care Home Management;Energy conservation;Manual techniques;Vestibular    PT Next Visit Plan Discharge today    Consulted and Agree with Plan of Care Patient           Patient will benefit from skilled therapeutic intervention in order to improve the following deficits and impairments:  Abnormal gait,Decreased coordination,Difficulty walking,Decreased endurance,Decreased activity tolerance,Impaired perceived functional ability,Decreased balance,Decreased mobility,Decreased strength,Improper body mechanics  Visit Diagnosis: Other abnormalities of gait and mobility  Muscle weakness (generalized)     Problem List Patient Active Problem List   Diagnosis Date Noted  . Stroke ([place]) 05/16/2020  . Acute focal neurological deficit 05/15/2020  . BPH (benign prostatic hyperplasia) 02/02/2016  . Fractured atrial pacemaker lead wire 07/18/2015  . 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 Chest pain 08/02/2013  . SOB (shortness of breath) 08/02/2013  . PVC (premature ventricular contraction) 02/13/2013  . Chest pain on exertion 02/25/2012  . Pacemaker-dual-chamber Medtronic 04/29/2011  . Change  in bowel habits 01/22/2011  .  Weight loss 01/22/2011  . COUGH 09/01/2010  . Multiple sclerosis ([place])   . HOCM (hypertrophic obstructive cardiomyopathy) ([place])   . Right bundle branch block   . Complete heart block ([place]) 12/05/2007    Electa Sniff, PT, DPT, NCS 09/19/2020, 12:53 PM  [facility] [address] [place], [place], [address] Phone: [phone]   Fax:  [phone]  Name: Rodney Adkins MRN: [id number] Date of Birth: 10-Jun-1956

## 2020-09-19 NOTE — Patient Instructions (Addendum)
Access Code: [email or web address] URL: [email or web address] Date: 09/19/2020 Prepared by: Cherly Anderson  Exercises Mini Squat with Counter Support - 2 x daily - 5 x weekly - 2 sets - 10 reps Standing Marching - 2 x daily - 5 x weekly - 3 sets Tandem Stance - 2 x daily - 5 x weekly - 1 sets - 3 reps - 20 sec hold Alternating tapping cup at counter - 1 x daily - 5 x weekly - 2 sets - 10 reps Standing Knee Flexion AROM with Chair Support - 2 x daily - 5 x weekly - 1 sets - 10 reps Prone Knee Flexion - 1 x daily - 5 x weekly - 2 sets - 10 reps Standing Balance with Eyes Closed on Foam - 1 x daily - 5 x weekly - 3 sets - 30 hold Scapular Retraction with Resistance - 2 x daily - 5 x weekly - 2 sets - 10 reps Walking with Eyes Closed and Counter Support - 2 x daily - 5 x weekly - 1 sets - 3-4 reps

## 2020-09-19 NOTE — Therapy (Signed)
[facility] [address] [place], [place], [address] Phone: [phone]   Fax:  [phone]  Occupational Therapy Treatment  Patient Details  Name: Rodney Adkins MRN: [id number] Date of Birth: [birth year] Referring Provider (OT): Darliss Cheney, MD   Encounter Date: 09/19/2020   OT End of Session - 09/19/20 1102    Visit Number 26    Number of Visits 30   updated at renewal   Date for OT Re-Evaluation 09/27/20   at renewal   Authorization Type [place] Time Period VL:MN renewal    OT Start Time 1102    OT Stop Time 1145    OT Time Calculation (min) 43 min    Activity Tolerance Patient tolerated treatment well    Behavior During Therapy [facility] for tasks assessed/performed           Past Medical History:  Diagnosis Date  . Complete heart block [facility]) June 2009   a. Presented with hypotension/pulm edema 12/2007 when HOCM was also discovered. s/p dual chamber MDT pacemaker. Thallium stest 7/09 negative for infiltrative disease or ischemia.  Followed Dr [name]  Marland Kitchen Heart murmur   . HOCM (hypertrophic obstructive cardiomyopathy) ([place]) 2009   Discovered by ECHO when he developed CHB. Mgmt Dr Caryl Comes  and Dr [name] at [facility]. Has outflow obstruction on ECHO 2011  . Multiple sclerosis ([place]) 1998   Sxs started 1998 with L body numbness. MRI c/w MS. Started Avonex. Saw Dr [name] at [facility] and switched to Betaseron and was on for 5 yrs but developed depression and site rxns and D/C'd 2006. While on Betaseron, occassionally required solumedrol for flares.  . Pacemaker 2017  . Prostate enlargement   . Right bundle branch block   . SOB (shortness of breath)    a. PFTs 09/2010 essentially normal per pulm note (mild obst defect). b. 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  . Wenckebach    a. Decreased ex tolerance 2010/2012 - underwent stress echo to look @ effects of MV/LVOT - nothing noted; although pt had high  rate Wenckebach behavior with associated ex intol. Pacer reprogrammed with improvement in sx.    Past Surgical History:  Procedure Laterality Date  . LEFT HEART CATHETERIZATION WITH CORONARY ANGIOGRAM N/A 08/22/2013   Procedure: LEFT HEART CATHETERIZATION WITH CORONARY ANGIOGRAM;  Surgeon: [name], MD;  Location: [facility] CATH LAB;  Service: Cardiovascular;  Laterality: N/A;  . LEG TENDON SURGERY  2008   Os peroneus resection and peroneal tendon repair  . pace Agricultural engineer    . PACEMAKER INSERTION  12/2007   Complete heart block  . PACEMAKER LEAD REMOVAL Left 07/18/2015   Procedure: ATRIAL PACEMAKER LEAD EXTRACTION; ATRIAL PACEMAKER LEAD INSERTION; GENERATOR REPLACEMENT.;  Surgeon: [name], MD;  Location: [place];  Service: Cardiovascular;  Laterality: Left;  [name] to back up  . PERIPHERAL VASCULAR CATHETERIZATION N/A 02/12/2015   Procedure: Venogram;  Surgeon: [name], MD;  Location: [place]  CV LAB;  Service: Cardiovascular;  Laterality: N/A;  . TEE WITHOUT CARDIOVERSION N/A 07/18/2015   Procedure: TRANSESOPHAGEAL ECHOCARDIOGRAM (TEE);  Surgeon: [name], MD;  Location: [facility] OR;  Service: Cardiovascular;  Laterality: N/A;    There were no vitals filed for this visit.   Subjective Assessment - 09/19/20 1103    Subjective  pt denies any pain.    Pertinent History Fall Risk. Speech Deficits.    Limitations MS, CHB s/p PPM, HOCM  Patient Stated Goals right hand. 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 Return to driving recommendations.    Currently in Pain? No/denies              Treatment:  Typing Master - typing test with 18 wpm, 86% accuracy and 16 wpm net speed.   Strengthening:  blue theraband with dowel with retraction and forward raise x 12   3 lb dumbbell - shoulder flexion, chest press, bicep curl, circumduction, hammer curl x 10 reps  Fine Motor Coordination - RUE with object in palm for encouraging tip  pinch  Putting pennies into piggy bank  Stacking in stacks of 5  In hand manipulation to place one at a time (5 in palm) into piggy bank  Conditioning - Arm Bike x 6 minutes level 2 for conditioning and BUE strengthening. (backward/forward)          OT Short Term Goals - 08/23/20 1104      OT SHORT TERM GOAL #1   Title Pt will be independent with HEP 06/21/20    Time 4    Period Weeks    Status Achieved    Target Date 06/21/20      OT SHORT TERM GOAL #2   Title Pt will demonstrate improved fine motor coordination by demonstrating decrease in 9 hole peg test score by 6 seconds with RUE.    Baseline RUE 71.06    Time 4    Period Weeks    Status Achieved      OT SHORT TERM GOAL #3   Title Pt will perform simple warm meal prep or home management task with supervision and good safety awareness.    Time 4    Period Weeks    Status Achieved      OT SHORT TERM GOAL #4   Title Pt will write 2-3 sentences with 75% legibility and increased size of letters (mild micrographia).    Baseline severe micrographia    Time 4    Period Weeks    Status Achieved   3 sentences with good letter size and legibility     OT SHORT TERM GOAL #5   Title Pt will demonstrate increase in grip strength by 5 lbs in RUE for increased ease with ADLs and clothing management, etc.    Baseline RUE 45.4 (LUE 65.6)    Time 4    Period Weeks    Status Achieved   RUE 55.7 lbs     OT SHORT TERM GOAL #6   Title Pt will report using RUE (dominant hand) for 25% or more of functional tasks.    Time 4    Period Weeks    Status Achieved             OT Long Term Goals - 09/12/20 1140      OT LONG TERM GOAL #1   Title Pt will be independent with updated HEP 08/27/20    Time 4    Period Weeks    Status Achieved      OT LONG TERM GOAL #2   Title *Pt will demonstrate improved fine motor coordination by demonstrating decrease in 9 hole peg test score to 26 seconds or less with RUE.    Baseline at  renewal RUE 30.94 seconds    Time 4    Period Weeks    Status On-going   08/23/2020 renewal - 30.94     OT LONG TERM GOAL #3  Title *Pt will perform work simulated tasks (typing, using mouse, etc) with RUE as dominant hand with 90% accuracy.    Time 4    Period Weeks    Status On-going   pt reports approx 70% to baseline.     OT LONG TERM GOAL #4   Title Pt will report completing at least 1 home management task per day with mod I in order to decrease caregiver burden.    Time 8    Period Weeks    Status Achieved      OT LONG TERM GOAL #5   Title Pt will improve grip strength by at least 10 lbs in RUE for strengthening dominant hand for functional use and tasks.    Baseline RUE 45.4 (LUE 65.6)    Time 8    Period Weeks    Status Achieved   61 RUE at renewal 08/23/20     OT LONG TERM GOAL #6   Title Pt will report using RUE (dominant hand) for at least 75% of functional tasks per day.    Time 8    Period Weeks    Status Achieved      OT LONG TERM GOAL #7   Title Pt will perform physical and cognitive tasks simultaneously with 95% accuracy for returning to prior tasks.    Time 8    Period Weeks    Status Achieved   100%     OT LONG TERM GOAL #8   Title Pt will write 3-5 sentences with 90% legibility and appropriate sizing.    Time 8    Period Weeks    Status Achieved   still has fatigue but much improved and takes increased time.     OT LONG TERM GOAL  #9   TITLE Pt will be independent with upgraded HEP for targeting proximal strength in RUE.    Time 4    Period Weeks    Status On-going      OT LONG TERM GOAL  #10   TITLE *Pt will improve grip strength by at least 5 lbs in RUE for strengthening dominant hand for functional use and tasks.    Baseline renewal 61 lbs RUE    Time 4    Period Weeks    Status Achieved   69.8lbs 09/12/20                Plan - 09/19/20 1122    Clinical Impression Statement Pt continues to progress towards goals.    OT Occupational  Profile and History Detailed Assessment- Review of Records and additional review of physical, cognitive, psychosocial history related to current functional performance    Occupational performance deficits (Please refer to evaluation for details): IADL's;ADL's;Social Participation;Leisure;Work;Rest and Sleep    Body Structure / Function / Physical Skills ADL;Dexterity;Decreased knowledge of use of DME;Strength;UE functional use;Endurance;IADL;Coordination;FMC    Rehab Potential Good    Clinical Decision Making Limited treatment options, no task modification necessary    Comorbidities Affecting Occupational Performance: None    Modification or Assistance to Complete Evaluation  No modification of tasks or assist necessary to complete eval    OT Frequency 2x / week    OT Duration 4 weeks   or 8 visits from renewal 07/30/20   OT Treatment/Interventions Self-care/ADL training;DME and/or AE instruction;Gait Training;Therapeutic activities;Balance training;Cognitive remediation/compensation;Therapeutic exercise;Neuromuscular education;Passive range of motion;Visual/perceptual remediation/compensation;Patient/family education;Energy conservation    Plan coordination, proximal strength    OT Home Exercise Plan coordination HEP    Consulted  and Agree with Plan of Care Patient;Family member/caregiver   spouse   Family Member Consulted spouse           Patient will benefit from skilled therapeutic intervention in order to improve the following deficits and impairments:   Body Structure / Function / Physical Skills: ADL,Dexterity,Decreased knowledge of use of DME,Strength,UE functional use,Endurance,IADL,Coordination,FMC       Visit Diagnosis: Muscle weakness (generalized)  Other lack of coordination  Hemiplegia and hemiparesis following cerebral infarction affecting right dominant side (HCC)  Other abnormalities of gait and mobility  Attention and concentration deficit    Problem  List Patient Active Problem List   Diagnosis Date Noted  . Stroke ([place]) 05/16/2020  . Acute focal neurological deficit 05/15/2020  . BPH (benign prostatic hyperplasia) 02/02/2016  . Fractured atrial pacemaker lead wire 07/18/2015  . 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 Chest pain 08/02/2013  . SOB (shortness of breath) 08/02/2013  . PVC (premature ventricular contraction) 02/13/2013  . Chest pain on exertion 02/25/2012  . Pacemaker-dual-chamber Medtronic 04/29/2011  . Change in bowel habits 01/22/2011  . Weight loss 01/22/2011  . COUGH 09/01/2010  . Multiple sclerosis ([place])   . HOCM (hypertrophic obstructive cardiomyopathy) ([place])   . Right bundle branch block   . Complete heart block ([place]) 12/05/2007    Zachery Conch MOT, OTR/L  09/19/2020, 11:23 AM  [facility] [address] [place], [place], [address] Phone: [phone]   Fax:  [phone]  Name: Rodney Adkins MRN: [id number] Date of Birth: 1955/07/07

## 2020-09-20 NOTE — Progress Notes (Signed)
Remote pacemaker transmission.   

## 2020-09-25 ENCOUNTER — Ambulatory Visit: Payer: BC Managed Care – PPO | Admitting: Occupational Therapy

## 2020-09-25 ENCOUNTER — Other Ambulatory Visit: Payer: Self-pay

## 2020-09-25 ENCOUNTER — Encounter: Payer: Self-pay | Admitting: Occupational Therapy

## 2020-09-25 DIAGNOSIS — R278 Other lack of coordination: Secondary | ICD-10-CM

## 2020-09-25 DIAGNOSIS — R41840 Attention and concentration deficit: Secondary | ICD-10-CM

## 2020-09-25 DIAGNOSIS — I69351 Hemiplegia and hemiparesis following cerebral infarction affecting right dominant side: Secondary | ICD-10-CM

## 2020-09-25 DIAGNOSIS — R2689 Other abnormalities of gait and mobility: Secondary | ICD-10-CM

## 2020-09-25 DIAGNOSIS — M6281 Muscle weakness (generalized): Secondary | ICD-10-CM

## 2020-09-25 NOTE — Therapy (Signed)
[facility] [address] [place], [place], [address] Phone: [phone]   Fax:  [phone]  Occupational Therapy Treatment  Patient Details  Name: Rodney Adkins MRN: [id number] Date of Birth: 1956-06-09 Referring Provider (OT): Darliss Cheney, MD   Encounter Date: 09/25/2020   OT End of Session - 09/25/20 1235    Visit Number 27    Number of Visits 30   updated at renewal   Date for OT Re-Evaluation 09/27/20   at renewal   Authorization Type [place] Time Period VL:MN renewal    OT Start Time 1234    OT Stop Time 1315    OT Time Calculation (min) 41 min    Activity Tolerance Patient tolerated treatment well    Behavior During Therapy [facility] for tasks assessed/performed           Past Medical History:  Diagnosis Date  . Complete heart block [facility]) June 2009   a. Presented with hypotension/pulm edema 12/2007 when HOCM was also discovered. s/p dual chamber MDT pacemaker. Thallium stest 7/09 negative for infiltrative disease or ischemia.  Followed Dr [name]  Marland Kitchen Heart murmur   . HOCM (hypertrophic obstructive cardiomyopathy) ([place]) 2009   Discovered by ECHO when he developed CHB. Mgmt Dr Caryl Comes  and Dr [name] at [facility]. Has outflow obstruction on ECHO 2011  . Multiple sclerosis ([place]) 1998   Sxs started 1998 with L body numbness. MRI c/w MS. Started Avonex. Saw Dr [name] at [facility] and switched to Betaseron and was on for 5 yrs but developed depression and site rxns and D/C'd 2006. While on Betaseron, occassionally required solumedrol for flares.  . Pacemaker 2017  . Prostate enlargement   . Right bundle branch block   . SOB (shortness of breath)    a. PFTs 09/2010 essentially normal per pulm note (mild obst defect). b. 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  . Wenckebach    a. Decreased ex tolerance 2010/2012 - underwent stress echo to look @ effects of MV/LVOT - nothing noted; although pt had high  rate Wenckebach behavior with associated ex intol. Pacer reprogrammed with improvement in sx.    Past Surgical History:  Procedure Laterality Date  . LEFT HEART CATHETERIZATION WITH CORONARY ANGIOGRAM N/A 08/22/2013   Procedure: LEFT HEART CATHETERIZATION WITH CORONARY ANGIOGRAM;  Surgeon: [name], MD;  Location: [facility] CATH LAB;  Service: Cardiovascular;  Laterality: N/A;  . LEG TENDON SURGERY  2008   Os peroneus resection and peroneal tendon repair  . pace Agricultural engineer    . PACEMAKER INSERTION  12/2007   Complete heart block  . PACEMAKER LEAD REMOVAL Left 07/18/2015   Procedure: ATRIAL PACEMAKER LEAD EXTRACTION; ATRIAL PACEMAKER LEAD INSERTION; GENERATOR REPLACEMENT.;  Surgeon: [name], MD;  Location: [place];  Service: Cardiovascular;  Laterality: Left;  [name] to back up  . PERIPHERAL VASCULAR CATHETERIZATION N/A 02/12/2015   Procedure: Venogram;  Surgeon: [name], MD;  Location: [place] CV LAB;  Service: Cardiovascular;  Laterality: N/A;  . TEE WITHOUT CARDIOVERSION N/A 07/18/2015   Procedure: TRANSESOPHAGEAL ECHOCARDIOGRAM (TEE);  Surgeon: [name], MD;  Location: [facility] OR;  Service: Cardiovascular;  Laterality: N/A;    There were no vitals filed for this visit.   Subjective Assessment - 09/25/20 1235    Subjective  Pt denies any pain.    Pertinent History Fall Risk. Speech Deficits.    Limitations MS, CHB s/p PPM, HOCM  Patient Stated Goals right hand. 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 Return to driving recommendations.    Currently in Pain? No/denies            TREATMENT:  Handwriting: working on speed and accuracy with writing/word finding for letters of the alphabet to simulate work-type notes. Worked on Occupational hygienist with emphasis on big letters and legibility. Encouraged to fill up entire line on wide ruled paper.  Pt continues to fatigue after handwriting activities and handwriting gets  smaller. Encouraged to use ballpoint or rollerball pen and not gel pens.                       OT Short Term Goals - 08/23/20 1104      OT SHORT TERM GOAL #1   Title Pt will be independent with HEP 06/21/20    Time 4    Period Weeks    Status Achieved    Target Date 06/21/20      OT SHORT TERM GOAL #2   Title Pt will demonstrate improved fine motor coordination by demonstrating decrease in 9 hole peg test score by 6 seconds with RUE.    Baseline RUE 71.06    Time 4    Period Weeks    Status Achieved      OT SHORT TERM GOAL #3   Title Pt will perform simple warm meal prep or home management task with supervision and good safety awareness.    Time 4    Period Weeks    Status Achieved      OT SHORT TERM GOAL #4   Title Pt will write 2-3 sentences with 75% legibility and increased size of letters (mild micrographia).    Baseline severe micrographia    Time 4    Period Weeks    Status Achieved   3 sentences with good letter size and legibility     OT SHORT TERM GOAL #5   Title Pt will demonstrate increase in grip strength by 5 lbs in RUE for increased ease with ADLs and clothing management, etc.    Baseline RUE 45.4 (LUE 65.6)    Time 4    Period Weeks    Status Achieved   RUE 55.7 lbs     OT SHORT TERM GOAL #6   Title Pt will report using RUE (dominant hand) for 25% or more of functional tasks.    Time 4    Period Weeks    Status Achieved             OT Long Term Goals - 09/12/20 1140      OT LONG TERM GOAL #1   Title Pt will be independent with updated HEP 08/27/20    Time 4    Period Weeks    Status Achieved      OT LONG TERM GOAL #2   Title *Pt will demonstrate improved fine motor coordination by demonstrating decrease in 9 hole peg test score to 26 seconds or less with RUE.    Baseline at renewal RUE 30.94 seconds    Time 4    Period Weeks    Status On-going   08/23/2020 renewal - 30.94     OT LONG TERM GOAL #3   Title *Pt will perform  work simulated tasks (typing, using mouse, etc) with RUE as dominant hand with 90% accuracy.    Time 4    Period Weeks  Status On-going   pt reports approx 70% to baseline.     OT LONG TERM GOAL #4   Title Pt will report completing at least 1 home management task per day with mod I in order to decrease caregiver burden.    Time 8    Period Weeks    Status Achieved      OT LONG TERM GOAL #5   Title Pt will improve grip strength by at least 10 lbs in RUE for strengthening dominant hand for functional use and tasks.    Baseline RUE 45.4 (LUE 65.6)    Time 8    Period Weeks    Status Achieved   61 RUE at renewal 08/23/20     OT LONG TERM GOAL #6   Title Pt will report using RUE (dominant hand) for at least 75% of functional tasks per day.    Time 8    Period Weeks    Status Achieved      OT LONG TERM GOAL #7   Title Pt will perform physical and cognitive tasks simultaneously with 95% accuracy for returning to prior tasks.    Time 8    Period Weeks    Status Achieved   100%     OT LONG TERM GOAL #8   Title Pt will write 3-5 sentences with 90% legibility and appropriate sizing.    Time 8    Period Weeks    Status Achieved   still has fatigue but much improved and takes increased time.     OT LONG TERM GOAL  #9   TITLE Pt will be independent with upgraded HEP for targeting proximal strength in RUE.    Time 4    Period Weeks    Status On-going      OT LONG TERM GOAL  #10   TITLE *Pt will improve grip strength by at least 5 lbs in RUE for strengthening dominant hand for functional use and tasks.    Baseline renewal 61 lbs RUE    Time 4    Period Weeks    Status Achieved   69.8lbs 09/12/20                Plan - 09/25/20 1237    Clinical Impression Statement Pt is ready for discharge next session.    OT Occupational Profile and History Detailed Assessment- Review of Records and additional review of physical, cognitive, psychosocial history related to current  functional performance    Occupational performance deficits (Please refer to evaluation for details): IADL's;ADL's;Social Participation;Leisure;Work;Rest and Sleep    Body Structure / Function / Physical Skills ADL;Dexterity;Decreased knowledge of use of DME;Strength;UE functional use;Endurance;IADL;Coordination;FMC    Rehab Potential Good    Clinical Decision Making Limited treatment options, no task modification necessary    Comorbidities Affecting Occupational Performance: None    Modification or Assistance to Complete Evaluation  No modification of tasks or assist necessary to complete eval    OT Frequency 2x / week    OT Duration 4 weeks   or 8 visits from renewal 07/30/20   OT Treatment/Interventions Self-care/ADL training;DME and/or AE instruction;Gait Training;Therapeutic activities;Balance training;Cognitive remediation/compensation;Therapeutic exercise;Neuromuscular education;Passive range of motion;Visual/perceptual remediation/compensation;Patient/family education;Energy conservation    Plan coordination, handwriitng, discharge next session    OT Home Exercise Plan coordination HEP    Consulted and Agree with Plan of Care Patient;Family member/caregiver   spouse   Family Member Consulted spouse           Patient will  benefit from skilled therapeutic intervention in order to improve the following deficits and impairments:   Body Structure / Function / Physical Skills: ADL,Dexterity,Decreased knowledge of use of DME,Strength,UE functional use,Endurance,IADL,Coordination,FMC       Visit Diagnosis: Muscle weakness (generalized)  Other lack of coordination  Hemiplegia and hemiparesis following cerebral infarction affecting right dominant side (HCC)  Other abnormalities of gait and mobility  Attention and concentration deficit    Problem List Patient Active Problem List   Diagnosis Date Noted  . Stroke ([place]) 05/16/2020  . Acute focal neurological deficit 05/15/2020  .  BPH (benign prostatic hyperplasia) 02/02/2016  . Fractured atrial pacemaker lead wire 07/18/2015  . 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 Chest pain 08/02/2013  . SOB (shortness of breath) 08/02/2013  . PVC (premature ventricular contraction) 02/13/2013  . Chest pain on exertion 02/25/2012  . Pacemaker-dual-chamber Medtronic 04/29/2011  . Change in bowel habits 01/22/2011  . Weight loss 01/22/2011  . COUGH 09/01/2010  . Multiple sclerosis ([place])   . HOCM (hypertrophic obstructive cardiomyopathy) ([place])   . Right bundle branch block   . Complete heart block ([place]) 12/05/2007    Zachery Conch MOT, OTR/L  09/25/2020, 1:26 PM  [facility] [address] [place], [place], [address] Phone: [phone]   Fax:  [phone]  Name: Rodney Adkins MRN: [id number] Date of Birth: [birth year]

## 2020-09-27 ENCOUNTER — Other Ambulatory Visit: Payer: Self-pay

## 2020-09-27 ENCOUNTER — Ambulatory Visit: Payer: BC Managed Care – PPO | Admitting: Occupational Therapy

## 2020-09-27 ENCOUNTER — Encounter: Payer: Self-pay | Admitting: Occupational Therapy

## 2020-09-27 DIAGNOSIS — R41840 Attention and concentration deficit: Secondary | ICD-10-CM

## 2020-09-27 DIAGNOSIS — I69351 Hemiplegia and hemiparesis following cerebral infarction affecting right dominant side: Secondary | ICD-10-CM

## 2020-09-27 DIAGNOSIS — R2689 Other abnormalities of gait and mobility: Secondary | ICD-10-CM

## 2020-09-27 DIAGNOSIS — R278 Other lack of coordination: Secondary | ICD-10-CM

## 2020-09-27 DIAGNOSIS — M6281 Muscle weakness (generalized): Secondary | ICD-10-CM

## 2020-09-27 NOTE — Therapy (Addendum)
[facility] [address] [place], [place], [address] Phone: [phone]   Fax:  [phone]  Occupational Therapy Treatment & Discharge Note  Patient Details  Name: Rodney Adkins MRN: [id number] Date of Birth: [birth year] Referring Provider (OT): Darliss Cheney, MD   Encounter Date: 09/27/2020   OT End of Session - 09/27/20 1105    Visit Number 28    Number of Visits 30   updated at renewal   Date for OT Re-Evaluation 09/27/20   at renewal   [place] Time Period VL:MN renewal    OT Start Time 1106    OT Stop Time 1138   discharge session   OT Time Calculation (min) 32 min    Activity Tolerance Patient tolerated treatment well    Behavior During Therapy [facility] for tasks assessed/performed           Past Medical History:  Diagnosis Date  . Complete heart block [facility]) June 2009   a. Presented with hypotension/pulm edema 12/2007 when HOCM was also discovered. s/p dual chamber MDT pacemaker. Thallium stest 7/09 negative for infiltrative disease or ischemia.  Followed Dr [name]  Marland Kitchen Heart murmur   . HOCM (hypertrophic obstructive cardiomyopathy) ([place]) 2009   Discovered by ECHO when he developed CHB. Mgmt Dr Caryl Comes  and Dr [name] at [facility]. Has outflow obstruction on ECHO 2011  . Multiple sclerosis ([place]) 1998   Sxs started 1998 with L body numbness. MRI c/w MS. Started Avonex. Saw Dr [name] at [facility] and switched to Betaseron and was on for 5 yrs but developed depression and site rxns and D/C'd 2006. While on Betaseron, occassionally required solumedrol for flares.  . Pacemaker 2017  . Prostate enlargement   . Right bundle branch block   . SOB (shortness of breath)    a. PFTs 09/2010 essentially normal per pulm note (mild obst defect). b. 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  . Wenckebach    a. Decreased ex tolerance 2010/2012 - underwent stress echo to look @ effects of MV/LVOT -  nothing noted; although pt had high rate Wenckebach behavior with associated ex intol. Pacer reprogrammed with improvement in sx.    Past Surgical History:  Procedure Laterality Date  . LEFT HEART CATHETERIZATION WITH CORONARY ANGIOGRAM N/A 08/22/2013   Procedure: LEFT HEART CATHETERIZATION WITH CORONARY ANGIOGRAM;  Surgeon: [name], MD;  Location: [facility] CATH LAB;  Service: Cardiovascular;  Laterality: N/A;  . LEG TENDON SURGERY  2008   Os peroneus resection and peroneal tendon repair  . pace Agricultural engineer    . PACEMAKER INSERTION  12/2007   Complete heart block  . PACEMAKER LEAD REMOVAL Left 07/18/2015   Procedure: ATRIAL PACEMAKER LEAD EXTRACTION; ATRIAL PACEMAKER LEAD INSERTION; GENERATOR REPLACEMENT.;  Surgeon: [name], MD;  Location: [place];  Service: Cardiovascular;  Laterality: Left;  [name] to back up  . PERIPHERAL VASCULAR CATHETERIZATION N/A 02/12/2015   Procedure: Venogram;  Surgeon: [name], MD;  Location: [place] CV LAB;  Service: Cardiovascular;  Laterality: N/A;  . TEE WITHOUT CARDIOVERSION N/A 07/18/2015   Procedure: TRANSESOPHAGEAL ECHOCARDIOGRAM (TEE);  Surgeon: [name], MD;  Location: [facility] OR;  Service: Cardiovascular;  Laterality: N/A;    There were no vitals filed for this visit.   Subjective Assessment - 09/27/20 1106    Subjective  Pt denies any pain.    Pertinent History Fall Risk. Speech Deficits.    Limitations MS,  CHB s/p PPM, HOCM    Patient Stated Goals right hand. 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 Return to driving recommendations.    Currently in Pain? No/denies          TREATMENT:  Distal Finger Control: tracing around various sized circles for increase in finger control and fluidity with strokes of pen. Pt continues to have deficits with fluidity and smooth strokes.   Mouse Accuracy: 51% of targets available and with 35% click accuracy. Much improved.  Typing Master: 23 wpm 100%  accuracy and 23 wpm net speed. Improved in one week from 18 wpm and 86% accuracy.  Hand Gripper: with RUE on level 2 with silver spring and picking up 1 inch blocks. Moderate drops.     OCCUPATIONAL THERAPY DISCHARGE SUMMARY  Visits from Start of Care: 28  Current functional level related to goals / functional outcomes: Pt has met all STGs and 9/10 LTGs. Pt continues to present with deficits with coordination in RUE and handwriting and typing speed.    Remaining deficits: Continued deficits with coordination, grip strength and handwriting and typing speed   Education / Equipment: HEPs  Plan: Patient agrees to discharge.  Patient goals were partially met. Patient is being discharged due to meeting the stated rehab goals.  ?????               OT Short Term Goals - 08/23/20 1104      OT SHORT TERM GOAL #1   Title Pt will be independent with HEP 06/21/20    Time 4    Period Weeks    Status Achieved    Target Date 06/21/20      OT SHORT TERM GOAL #2   Title Pt will demonstrate improved fine motor coordination by demonstrating decrease in 9 hole peg test score by 6 seconds with RUE.    Baseline RUE 71.06    Time 4    Period Weeks    Status Achieved      OT SHORT TERM GOAL #3   Title Pt will perform simple warm meal prep or home management task with supervision and good safety awareness.    Time 4    Period Weeks    Status Achieved      OT SHORT TERM GOAL #4   Title Pt will write 2-3 sentences with 75% legibility and increased size of letters (mild micrographia).    Baseline severe micrographia    Time 4    Period Weeks    Status Achieved   3 sentences with good letter size and legibility     OT SHORT TERM GOAL #5   Title Pt will demonstrate increase in grip strength by 5 lbs in RUE for increased ease with ADLs and clothing management, etc.    Baseline RUE 45.4 (LUE 65.6)    Time 4    Period Weeks    Status Achieved   RUE 55.7 lbs     OT SHORT TERM GOAL #6    Title Pt will report using RUE (dominant hand) for 25% or more of functional tasks.    Time 4    Period Weeks    Status Achieved             OT Long Term Goals - 09/27/20 1108      OT LONG TERM GOAL #1   Title Pt will be independent with updated HEP 08/27/20    Time 4    Period Weeks  Status Achieved      OT LONG TERM GOAL #2   Title *Pt will demonstrate improved fine motor coordination by demonstrating decrease in 9 hole peg test score to 26 seconds or less with RUE.    Baseline at renewal RUE 30.94 seconds    Time 4    Period Weeks    Status Not Met   09/27/2020 30.44s     OT LONG TERM GOAL #3   Title *Pt will perform work simulated tasks (typing, using mouse, etc) with RUE as dominant hand with 90% accuracy.    Time 4    Period Weeks    Status Achieved   still has some deficits but has made progress with handwriting, typing and mouse accuracy     OT LONG TERM GOAL #4   Title Pt will report completing at least 1 home management task per day with mod I in order to decrease caregiver burden.    Time 8    Period Weeks    Status Achieved      OT LONG TERM GOAL #5   Title Pt will improve grip strength by at least 10 lbs in RUE for strengthening dominant hand for functional use and tasks.    Baseline RUE 45.4 (LUE 65.6)    Time 8    Period Weeks    Status Achieved   61 RUE at renewal 08/23/20     OT LONG TERM GOAL #6   Title Pt will report using RUE (dominant hand) for at least 75% of functional tasks per day.    Time 8    Period Weeks    Status Achieved      OT LONG TERM GOAL #7   Title Pt will perform physical and cognitive tasks simultaneously with 95% accuracy for returning to prior tasks.    Time 8    Period Weeks    Status Achieved   100%     OT LONG TERM GOAL #8   Title Pt will write 3-5 sentences with 90% legibility and appropriate sizing.    Time 8    Period Weeks    Status Achieved   still has fatigue but much improved and takes increased time.      OT LONG TERM GOAL  #9   TITLE Pt will be independent with upgraded HEP for targeting proximal strength in RUE.    Time 4    Period Weeks    Status Achieved      OT LONG TERM GOAL  #10   TITLE *Pt will improve grip strength by at least 5 lbs in RUE for strengthening dominant hand for functional use and tasks.    Baseline renewal 61 lbs RUE    Time 4    Period Weeks    Status Achieved   69.8lbs 09/12/20                Plan - 09/27/20 1128    Clinical Impression Statement Pt has met all STGs and has met 9/10 LTGs and is ready for discharge at this time. Pt has made progress with overall functional use of RUE, strength and coordination.    OT Occupational Profile and History Detailed Assessment- Review of Records and additional review of physical, cognitive, psychosocial history related to current functional performance    Occupational performance deficits (Please refer to evaluation for details): IADL's;ADL's;Social Participation;Leisure;Work;Rest and Sleep    Body Structure / Function / Physical Skills ADL;Dexterity;Decreased knowledge of use of DME;Strength;UE functional use;Endurance;IADL;Coordination;FMC  Rehab Potential Good    Clinical Decision Making Limited treatment options, no task modification necessary    Comorbidities Affecting Occupational Performance: None    Modification or Assistance to Complete Evaluation  No modification of tasks or assist necessary to complete eval    OT Frequency 2x / week    OT Duration 4 weeks   or 8 visits from renewal 07/30/20   OT Treatment/Interventions Self-care/ADL training;DME and/or AE instruction;Gait Training;Therapeutic activities;Balance training;Cognitive remediation/compensation;Therapeutic exercise;Neuromuscular education;Passive range of motion;Visual/perceptual remediation/compensation;Patient/family education;Energy conservation    Plan OT discharge    OT Home Exercise Plan coordination HEP    Consulted and Agree with Plan  of Care Patient;Family member/caregiver   spouse   Family Member Consulted spouse           Patient will benefit from skilled therapeutic intervention in order to improve the following deficits and impairments:   Body Structure / Function / Physical Skills: ADL,Dexterity,Decreased knowledge of use of DME,Strength,UE functional use,Endurance,IADL,Coordination,FMC       Visit Diagnosis: Muscle weakness (generalized)  Other lack of coordination  Hemiplegia and hemiparesis following cerebral infarction affecting right dominant side (HCC)  Other abnormalities of gait and mobility  Attention and concentration deficit    Problem List Patient Active Problem List   Diagnosis Date Noted  . Stroke ([place]) 05/16/2020  . Acute focal neurological deficit 05/15/2020  . BPH (benign prostatic hyperplasia) 02/02/2016  . Fractured atrial pacemaker lead wire 07/18/2015  . 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 Chest pain 08/02/2013  . SOB (shortness of breath) 08/02/2013  . PVC (premature ventricular contraction) 02/13/2013  . Chest pain on exertion 02/25/2012  . Pacemaker-dual-chamber Medtronic 04/29/2011  . Change in bowel habits 01/22/2011  . Weight loss 01/22/2011  . COUGH 09/01/2010  . Multiple sclerosis ([place])   . HOCM (hypertrophic obstructive cardiomyopathy) ([place])   . Right bundle branch block   . Complete heart block ([place]) 12/05/2007    Zachery Conch MOT, OTR/L  09/27/2020, 11:34 AM  [facility] [address] [place], [place], [address] Phone: [phone]   Fax:  [phone]  Name: NORTH ESTERLINE MRN: [id number] Date of Birth: 04-Oct-1955

## 2020-09-27 NOTE — Patient Instructions (Signed)
Typing Websites:  Typing.com Typingclub.com Typinggames.zone TypingTest.com   Mouse Accuracy:  Mouseaccuracy.com  

## 2020-12-11 DIAGNOSIS — R1310 Dysphagia, unspecified: Secondary | ICD-10-CM | POA: Insufficient documentation

## 2020-12-12 ENCOUNTER — Ambulatory Visit (INDEPENDENT_AMBULATORY_CARE_PROVIDER_SITE_OTHER): Payer: BC Managed Care – PPO

## 2020-12-12 DIAGNOSIS — I421 Obstructive hypertrophic cardiomyopathy: Secondary | ICD-10-CM

## 2020-12-13 LAB — CUP PACEART REMOTE DEVICE CHECK
Battery Remaining Longevity: 43 mo
Battery Voltage: 2.98 V
Brady Statistic AP VP Percent: 2.11 %
Brady Statistic AP VS Percent: 0 %
Brady Statistic AS VP Percent: 97.89 %
Brady Statistic AS VS Percent: 0 %
Brady Statistic RA Percent Paced: 2.11 %
Brady Statistic RV Percent Paced: 99.99 %
Date Time Interrogation Session: 20220610132706
Implantable Lead Implant Date: 20090608
Implantable Lead Implant Date: 20170112
Implantable Lead Location: 753859
Implantable Lead Location: 753860
Implantable Lead Model: 5076
Implantable Lead Model: 5076
Implantable Pulse Generator Implant Date: 20170112
Lead Channel Impedance Value: 342 Ohm
Lead Channel Impedance Value: 380 Ohm
Lead Channel Impedance Value: 456 Ohm
Lead Channel Impedance Value: 551 Ohm
Lead Channel Pacing Threshold Amplitude: 0.625 V
Lead Channel Pacing Threshold Amplitude: 0.875 V
Lead Channel Pacing Threshold Pulse Width: 0.4 ms
Lead Channel Pacing Threshold Pulse Width: 0.4 ms
Lead Channel Sensing Intrinsic Amplitude: 19.375 mV
Lead Channel Sensing Intrinsic Amplitude: 19.375 mV
Lead Channel Sensing Intrinsic Amplitude: 2.75 mV
Lead Channel Sensing Intrinsic Amplitude: 2.75 mV
Lead Channel Setting Pacing Amplitude: 2 V
Lead Channel Setting Pacing Amplitude: 2.5 V
Lead Channel Setting Pacing Pulse Width: 0.4 ms
Lead Channel Setting Sensing Sensitivity: 5.6 mV

## 2021-01-02 ENCOUNTER — Encounter: Payer: Self-pay | Admitting: Internal Medicine

## 2021-01-02 ENCOUNTER — Other Ambulatory Visit: Payer: Self-pay

## 2021-01-02 ENCOUNTER — Ambulatory Visit (INDEPENDENT_AMBULATORY_CARE_PROVIDER_SITE_OTHER): Payer: BC Managed Care – PPO | Admitting: Internal Medicine

## 2021-01-02 VITALS — BP 122/52 | HR 80 | Ht 70.0 in | Wt 127.8 lb

## 2021-01-02 DIAGNOSIS — I442 Atrioventricular block, complete: Secondary | ICD-10-CM

## 2021-01-02 DIAGNOSIS — Z950 Presence of cardiac pacemaker: Secondary | ICD-10-CM

## 2021-01-02 DIAGNOSIS — I421 Obstructive hypertrophic cardiomyopathy: Secondary | ICD-10-CM

## 2021-01-02 NOTE — Patient Instructions (Signed)

## 2021-01-02 NOTE — Progress Notes (Signed)
Patient ID: Rodney Adkins, male   DOB: August 27, 1955, 65 y.o.   MRN: [id number]        Patient Care Team: [name], MD as PCP - General [name], MD as Consulting Physician (Gastroenterology) [name], MD as Consulting Physician (Cardiology)   HPI  Rodney Adkins is a 65 y.o. male Seen in followup for HTN with complete heart block status post pacing. He has been seen again recently because of exercise intolerance previously had been associated with upper rate Wenckebach behavior. Because of episodes of chest pain he underwent catheterization demonstrating no obstructive coronary disease but there were gradients.LVEDP>>5  He had a broken atrial lead. He saw both Dr. Elliot Cousin and [facility] and elected to have his extraction done here. It was done by Dr. Lovena Le 1/17   4/17 Echo LVEF 60-65% with gradeint resting 29  Not  Exercised.  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   Interval "stroke"11/21 presenting with slurred speech.  MRI imaging demonstrated "acute 12 mm diffusion restricted lesion of the left corona radiata "radiological impression was demyelination over stroke.  Reviewing the discharge summary ultimately the feeling it was related to stroke and not an MS flare.    He underwent extensive therapy including speech without significant improvement   there is  associated depression for which  he was started on Wellbutrin this was stopped also without improvement  Today, the patient denies chest pain, nocturnal dyspnea, orthopnea or peripheral edema.  There have been no palpitations, lightheadedness or syncope.  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 2 yearly    (7/18) 15.0   11/21   (05/16/20)3.7    (05/16/20 )  14.9   11/ 21 (CE) 0.81 4.4        15.2      Past Medical History:  Diagnosis Date   Complete heart block [facility]) June 2009   a. Presented with hypotension/pulm edema 12/2007 when HOCM was also discovered. s/p dual chamber MDT pacemaker. Thallium stest 7/09 negative for infiltrative disease or ischemia.  Followed Dr [name]   Heart murmur    HOCM (hypertrophic obstructive cardiomyopathy) ([place]) 2009   Discovered by ECHO when he developed CHB. Mgmt Dr Caryl Comes  and Dr [name] at [facility]. Has outflow obstruction on ECHO 2011   Multiple sclerosis ([place]) 1998   Sxs started 1998 with L body numbness. MRI c/w MS. Started Avonex. Saw Dr [name] at [facility] and switched to Betaseron and was on for 5 yrs but developed depression and site rxns and D/C'd 2006. While on Betaseron, occassionally required solumedrol for flares.   Pacemaker 2017   Prostate enlargement    Right bundle branch block    SOB (  shortness of breath)    a. PFTs 09/2010 essentially normal per pulm note (mild obst defect). b. 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 Pacer reprogrammed with improvement in sx.    Past Surgical History:  Procedure Laterality Date   LEFT HEART CATHETERIZATION WITH CORONARY ANGIOGRAM N/A 08/22/2013   Procedure: LEFT HEART CATHETERIZATION WITH CORONARY ANGIOGRAM;  Surgeon: [name], MD;  Location: [facility] CATH LAB;  Service: Cardiovascular;  Laterality: N/A;   LEG TENDON SURGERY  2008   Os peroneus resection and  peroneal tendon repair   pace maker     PACEMAKER INSERTION  12/2007   Complete heart block   PACEMAKER LEAD REMOVAL Left 07/18/2015   Procedure: ATRIAL PACEMAKER LEAD EXTRACTION; ATRIAL PACEMAKER LEAD INSERTION; GENERATOR REPLACEMENT.;  Surgeon: [name], MD;  Location: [place];  Service: Cardiovascular;  Laterality: Left;  [name] to back up   PERIPHERAL VASCULAR CATHETERIZATION N/A 02/12/2015   Procedure: Venogram;  Surgeon: [name], MD;  Location: [place] CV LAB;  Service: Cardiovascular;  Laterality: N/A;   TEE WITHOUT CARDIOVERSION N/A 07/18/2015   Procedure: TRANSESOPHAGEAL ECHOCARDIOGRAM (TEE);  Surgeon: [name], MD;  Location: [facility] OR;  Service: Cardiovascular;  Laterality: N/A;    Current Outpatient Medications  Medication Sig Dispense Refill   atorvastatin (LIPITOR) 40 MG tablet Take 1 tablet (40 mg total) by mouth daily at 6 PM. 30 tablet 0   BAFIERTAM 95 MG CPDR Take 95 mg by mouth in the morning and at bedtime.      Multiple Vitamin (MULTIVITAMIN) tablet Take 1 tablet by mouth daily.      No current facility-administered medications for this visit.    No Known Allergies  Review of Systems negative except from HPI and PMH  Physical Exam: BP (!) 122/52   Pulse 80   Ht 5\' 10"  (1.778 m)   Wt 127 lb 12.8 oz (58 kg)   SpO2 97%   BMI 18.34 kg/m  Well developed and well nourished in no acute distress HENT normal Neck supple with JVP-flat Lungs Clear Device pocket well healed; without hematoma or erythema.  There is no tethering  Regular rate and rhythm, No gallops, No rubs and No murmur Abd-soft with active BS No Clubbing cyanosis No edema Skin-warm and dry A & Oriented  Grossly normal sensory and motor function Dysarthric and walking with a stick  ECG: P synchronous pacing at 80 Interval 16/17/44  Assessment and  Plan  HCM  Complete Heart Block  Pacer Medtronic     Dyspnea on exertion  Multiple sclerosis   Extensive discussions  regarding the interval medical history with his stroke for which she is now on antiplatelet therapy and statins.  Reviewed some of the data regarding concerns of increased bleeding with atorvastatin and the potential relative benefits of rosuvastatin.  Importantly there is no evidence of atrial fibrillation on his device  Functional status is now largely limited by neurological issues and less by his dyspnea.  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      Brooks Sailors as a scribe for Virl Axe, MD.,have documented all relevant documentation on the behalf of Virl Axe, MD,as directed by  Virl Axe, MD while in the presence of Virl Axe, MD.  I, Virl Axe, MD, have reviewed all documentation for this visit. The documentation on 01/02/21 for the exam, diagnosis, procedures, and orders are all accurate and complete.

## 2021-01-03 NOTE — Progress Notes (Signed)
Remote pacemaker transmission.   

## 2021-01-15 ENCOUNTER — Other Ambulatory Visit (HOSPITAL_COMMUNITY): Payer: Self-pay | Admitting: Neurology

## 2021-01-15 DIAGNOSIS — G35 Multiple sclerosis: Secondary | ICD-10-CM

## 2021-01-15 DIAGNOSIS — I63512 Cerebral infarction due to unspecified occlusion or stenosis of left middle cerebral artery: Secondary | ICD-10-CM

## 2021-02-12 ENCOUNTER — Other Ambulatory Visit: Payer: Self-pay

## 2021-02-12 ENCOUNTER — Ambulatory Visit (HOSPITAL_COMMUNITY)
Admission: RE | Admit: 2021-02-12 | Discharge: 2021-02-12 | Disposition: A | Discharge status: Home / Self Care | Payer: BC Managed Care – PPO | Source: Ambulatory Visit | Attending: Neurology | Admitting: Neurology

## 2021-02-12 DIAGNOSIS — I63512 Cerebral infarction due to unspecified occlusion or stenosis of left middle cerebral artery: Secondary | ICD-10-CM | POA: Diagnosis present

## 2021-02-12 DIAGNOSIS — G35 Multiple sclerosis: Secondary | ICD-10-CM | POA: Diagnosis present

## 2021-02-12 MED ORDER — GADOBUTROL 1 MMOL/ML IV SOLN
7.0000 mL | Freq: Once | INTRAVENOUS | Status: AC | PRN
  Administered 2021-02-12: 7 mL via INTRAVENOUS

## 2021-02-12 NOTE — Progress Notes (Signed)
[facility] here today at [facility] for MRI brain W/WO. Patient has a Patent examiner. [facility] express sent to [facility] and Renee-cardiology PA. Orders received for DOO 95

## 2021-03-13 ENCOUNTER — Ambulatory Visit (INDEPENDENT_AMBULATORY_CARE_PROVIDER_SITE_OTHER): Payer: BC Managed Care – PPO

## 2021-03-13 DIAGNOSIS — I442 Atrioventricular block, complete: Secondary | ICD-10-CM | POA: Diagnosis not present

## 2021-03-17 LAB — CUP PACEART REMOTE DEVICE CHECK
Battery Remaining Longevity: 47 mo
Battery Voltage: 2.98 V
Brady Statistic AP VP Percent: 1.65 %
Brady Statistic AP VS Percent: 0 %
Brady Statistic AS VP Percent: 98.34 %
Brady Statistic AS VS Percent: 0 %
Brady Statistic RA Percent Paced: 1.65 %
Brady Statistic RV Percent Paced: 99.99 %
Date Time Interrogation Session: 20220909215125
Implantable Lead Implant Date: 20090608
Implantable Lead Implant Date: 20170112
Implantable Lead Location: 753859
Implantable Lead Location: 753860
Implantable Lead Model: 5076
Implantable Lead Model: 5076
Implantable Pulse Generator Implant Date: 20170112
Lead Channel Impedance Value: 380 Ohm
Lead Channel Impedance Value: 437 Ohm
Lead Channel Impedance Value: 456 Ohm
Lead Channel Impedance Value: 551 Ohm
Lead Channel Pacing Threshold Amplitude: 0.625 V
Lead Channel Pacing Threshold Amplitude: 0.875 V
Lead Channel Pacing Threshold Pulse Width: 0.4 ms
Lead Channel Pacing Threshold Pulse Width: 0.4 ms
Lead Channel Sensing Intrinsic Amplitude: 10.5 mV
Lead Channel Sensing Intrinsic Amplitude: 10.5 mV
Lead Channel Sensing Intrinsic Amplitude: 4 mV
Lead Channel Sensing Intrinsic Amplitude: 4 mV
Lead Channel Setting Pacing Amplitude: 2 V
Lead Channel Setting Pacing Amplitude: 2.5 V
Lead Channel Setting Pacing Pulse Width: 0.4 ms
Lead Channel Setting Sensing Sensitivity: 5.6 mV

## 2021-03-21 NOTE — Progress Notes (Signed)
Remote pacemaker transmission.   

## 2021-06-06 ENCOUNTER — Encounter: Payer: Self-pay | Admitting: Internal Medicine

## 2021-06-12 ENCOUNTER — Ambulatory Visit (INDEPENDENT_AMBULATORY_CARE_PROVIDER_SITE_OTHER): Payer: BC Managed Care – PPO

## 2021-06-12 DIAGNOSIS — I442 Atrioventricular block, complete: Secondary | ICD-10-CM | POA: Diagnosis not present

## 2021-06-15 LAB — CUP PACEART REMOTE DEVICE CHECK
Battery Remaining Longevity: 42 mo
Battery Voltage: 2.97 V
Brady Statistic AP VP Percent: 5.69 %
Brady Statistic AP VS Percent: 0 %
Brady Statistic AS VP Percent: 94.31 %
Brady Statistic AS VS Percent: 0.01 %
Brady Statistic RA Percent Paced: 5.69 %
Brady Statistic RV Percent Paced: 99.99 %
Date Time Interrogation Session: 20221209215903
Implantable Lead Implant Date: 20090608
Implantable Lead Implant Date: 20170112
Implantable Lead Location: 753859
Implantable Lead Location: 753860
Implantable Lead Model: 5076
Implantable Lead Model: 5076
Implantable Pulse Generator Implant Date: 20170112
Lead Channel Impedance Value: 342 Ohm
Lead Channel Impedance Value: 361 Ohm
Lead Channel Impedance Value: 418 Ohm
Lead Channel Impedance Value: 513 Ohm
Lead Channel Pacing Threshold Amplitude: 0.5 V
Lead Channel Pacing Threshold Amplitude: 1 V
Lead Channel Pacing Threshold Pulse Width: 0.4 ms
Lead Channel Pacing Threshold Pulse Width: 0.4 ms
Lead Channel Sensing Intrinsic Amplitude: 17.75 mV
Lead Channel Sensing Intrinsic Amplitude: 17.75 mV
Lead Channel Sensing Intrinsic Amplitude: 2.5 mV
Lead Channel Sensing Intrinsic Amplitude: 2.5 mV
Lead Channel Setting Pacing Amplitude: 2 V
Lead Channel Setting Pacing Amplitude: 2.5 V
Lead Channel Setting Pacing Pulse Width: 0.4 ms
Lead Channel Setting Sensing Sensitivity: 5.6 mV

## 2021-06-20 NOTE — Progress Notes (Signed)
Remote pacemaker transmission.   

## 2021-07-04 ENCOUNTER — Encounter (HOSPITAL_BASED_OUTPATIENT_CLINIC_OR_DEPARTMENT_OTHER): Payer: Self-pay

## 2021-07-04 DIAGNOSIS — R0683 Snoring: Secondary | ICD-10-CM

## 2021-07-04 DIAGNOSIS — G4733 Obstructive sleep apnea (adult) (pediatric): Secondary | ICD-10-CM

## 2021-07-31 ENCOUNTER — Other Ambulatory Visit: Payer: Self-pay

## 2021-07-31 ENCOUNTER — Ambulatory Visit (HOSPITAL_BASED_OUTPATIENT_CLINIC_OR_DEPARTMENT_OTHER): Payer: BC Managed Care – PPO | Attending: Internal Medicine | Admitting: Internal Medicine

## 2021-07-31 VITALS — Ht 70.0 in | Wt 134.0 lb

## 2021-07-31 DIAGNOSIS — R0683 Snoring: Secondary | ICD-10-CM | POA: Diagnosis present

## 2021-07-31 DIAGNOSIS — G4733 Obstructive sleep apnea (adult) (pediatric): Secondary | ICD-10-CM

## 2021-08-09 DIAGNOSIS — G4733 Obstructive sleep apnea (adult) (pediatric): Secondary | ICD-10-CM

## 2021-08-09 NOTE — Procedures (Signed)
° ° ° °  Patient Name: Rodney Adkins, Rodney Adkins Date: 07/31/2021 Gender: Male D.O.B: 05-30-1956 Age (years): 41 Referring Provider: Not Available Height (inches): 70 Interpreting Physician: Baird Lyons MD, ABSM Weight (lbs): 134 RPSGT: Zadie Rhine BMI: 19 MRN: [id number] Neck Size: 13.00  CLINICAL INFORMATION Sleep Study Type: NPSG Indication for sleep study: Snoring Epworth Sleepiness Score: 10  SLEEP STUDY TECHNIQUE As per the AASM Manual for the Scoring of Sleep and Associated Events v2.3 (April 2016) with a hypopnea requiring 4% desaturations.  The channels recorded and monitored were frontal, central and occipital EEG, electrooculogram (EOG), submentalis EMG (chin), nasal and oral airflow, thoracic and abdominal wall motion, anterior tibialis EMG, snore microphone, electrocardiogram, and pulse oximetry.  MEDICATIONS Medications self-administered by patient taken the night of the study : none reported  SLEEP ARCHITECTURE The study was initiated at 10:21:18 PM and ended at 4:31:59 AM.  Sleep onset time was 2.1 minutes and the sleep efficiency was 81.5%%. The total sleep time was 302 minutes.  Stage REM latency was 195.5 minutes.  The patient spent 6.0%% of the night in stage N1 sleep, 79.3%% in stage N2 sleep, 1.2%% in stage N3 and 13.6% in REM.  Alpha intrusion was absent.  Supine sleep was 100.00%.  RESPIRATORY PARAMETERS The overall apnea/hypopnea index (AHI) was 2.0 per hour. There were 7 total apneas, including 7 obstructive, 0 central and 0 mixed apneas. There were 3 hypopneas and 31 RERAs.  The AHI during Stage REM sleep was 5.9 per hour.  AHI while supine was 2.0 per hour.  The mean oxygen saturation was 95.3%. The minimum SpO2 during sleep was 92.0%.  soft snoring was noted during this study.  CARDIAC DATA The 2 lead EKG demonstrated sinus rhythm, pacemaker generated. The mean heart rate was 67.8 beats per minute. Other EKG findings include: None.  LEG  MOVEMENT DATA The total PLMS were 0 with a resulting PLMS index of 0.0. Associated arousal with leg movement index was 0.0 .  IMPRESSIONS - No significant obstructive sleep apnea occurred during this study (AHI = 2.0/h). - The patient had minimal or no oxygen desaturation during the study (Min O2 = 92.0%) - The patient snored with soft snoring volume. - No cardiac abnormalities were noted during this study. Paced rhythm - Clinically significant periodic limb movements did not occur during sleep. No significant associated arousals.  DIAGNOSIS - Normal study  RECOMMENDATIONS - Manage for symptoms based on clinical judgment - Sleep hygiene should be reviewed to assess factors that may improve sleep quality. - Weight management and regular exercise should be initiated or continued if appropriate.  [Electronically signed] 08/09/2021 12:10 PM  Baird Lyons MD, [place], American Board of Sleep Medicine   NPI: [id number]                        [place], [place] of Sleep [facility]  ELECTRONICALLY SIGNED ON:  08/09/2021, 12:08 PM [place] PH: [phone]   FX: [phone] [place]

## 2021-08-28 DIAGNOSIS — Z808 Family history of malignant neoplasm of other organs or systems: Secondary | ICD-10-CM | POA: Insufficient documentation

## 2021-09-04 ENCOUNTER — Encounter: Payer: Self-pay | Admitting: Internal Medicine

## 2021-09-10 LAB — CUP PACEART REMOTE DEVICE CHECK
Battery Remaining Longevity: 37 mo
Battery Voltage: 2.97 V
Brady Statistic AP VP Percent: 6.4 %
Brady Statistic AP VS Percent: 0 %
Brady Statistic AS VP Percent: 93.59 %
Brady Statistic AS VS Percent: 0.01 %
Brady Statistic RA Percent Paced: 6.4 %
Brady Statistic RV Percent Paced: 99.99 %
Date Time Interrogation Session: 20230308114833
Implantable Lead Implant Date: 20090608
Implantable Lead Implant Date: 20170112
Implantable Lead Location: 753859
Implantable Lead Location: 753860
Implantable Lead Model: 5076
Implantable Lead Model: 5076
Implantable Pulse Generator Implant Date: 20170112
Lead Channel Impedance Value: 342 Ohm
Lead Channel Impedance Value: 361 Ohm
Lead Channel Impedance Value: 418 Ohm
Lead Channel Impedance Value: 513 Ohm
Lead Channel Pacing Threshold Amplitude: 0.625 V
Lead Channel Pacing Threshold Amplitude: 1 V
Lead Channel Pacing Threshold Pulse Width: 0.4 ms
Lead Channel Pacing Threshold Pulse Width: 0.4 ms
Lead Channel Sensing Intrinsic Amplitude: 17.5 mV
Lead Channel Sensing Intrinsic Amplitude: 17.5 mV
Lead Channel Sensing Intrinsic Amplitude: 3 mV
Lead Channel Sensing Intrinsic Amplitude: 3 mV
Lead Channel Setting Pacing Amplitude: 2 V
Lead Channel Setting Pacing Amplitude: 2.5 V
Lead Channel Setting Pacing Pulse Width: 0.4 ms
Lead Channel Setting Sensing Sensitivity: 5.6 mV

## 2021-09-11 ENCOUNTER — Ambulatory Visit (INDEPENDENT_AMBULATORY_CARE_PROVIDER_SITE_OTHER): Payer: BC Managed Care – PPO

## 2021-09-11 DIAGNOSIS — I442 Atrioventricular block, complete: Secondary | ICD-10-CM | POA: Diagnosis not present

## 2021-09-22 ENCOUNTER — Telehealth: Payer: Self-pay | Admitting: *Deleted

## 2021-09-22 NOTE — Telephone Encounter (Signed)
Office visit please ? ?THANKS ?

## 2021-09-22 NOTE — Telephone Encounter (Signed)
Dr Carlean Purl, ? ?Rodney Adkins is scheduled for a screening colon with you on 11-18-21.  Since his last colonoscopy in 2012, he has an extensive cardiac history and had a stroke in 2021. Please review his chart and let me know if you want him to have an OV prior to a colon due to this history or if direct is okay for you.  ? ?Thanks for your time,  Marijean Niemann  ? ? ?

## 2021-09-22 NOTE — Telephone Encounter (Signed)
SCh OV with Dr Carlean Purl 4-13 at 930 am, left 5-16 colon on the  books until OV  ?Lelan Pons PV  ?

## 2021-09-22 NOTE — Telephone Encounter (Signed)
Attempted to call pt to make ov- no answer- lm on machine to call back to make an OV with Dr Carlean Purl per Dr Carlean Purl  ?

## 2021-09-25 NOTE — Progress Notes (Signed)
Remote pacemaker transmission.   

## 2021-10-16 ENCOUNTER — Ambulatory Visit: Payer: BC Managed Care – PPO | Admitting: Internal Medicine

## 2021-10-16 ENCOUNTER — Encounter: Payer: Self-pay | Admitting: Internal Medicine

## 2021-10-16 VITALS — BP 116/82 | HR 69 | Ht 70.0 in | Wt 134.2 lb

## 2021-10-16 DIAGNOSIS — Z8673 Personal history of transient ischemic attack (TIA), and cerebral infarction without residual deficits: Secondary | ICD-10-CM

## 2021-10-16 DIAGNOSIS — Z1211 Encounter for screening for malignant neoplasm of colon: Secondary | ICD-10-CM | POA: Diagnosis not present

## 2021-10-16 DIAGNOSIS — G35 Multiple sclerosis: Secondary | ICD-10-CM

## 2021-10-16 DIAGNOSIS — Z950 Presence of cardiac pacemaker: Secondary | ICD-10-CM | POA: Diagnosis not present

## 2021-10-16 NOTE — Patient Instructions (Signed)
If you are age 67 or older, your body mass index should be between 23-30. Your Body mass index is 19.26 kg/m?Marland Kitchen If this is out of the aforementioned range listed, please consider follow up with your Primary Care Provider. ? ?If you are age 61 or younger, your body mass index should be between 19-25. Your Body mass index is 19.26 kg/m?Marland Kitchen If this is out of the aformentioned range listed, please consider follow up with your Primary Care Provider.  ? ?________________________________________________________ ? ?The [facility] providers would like to encourage you to use [facility] to communicate with providers for non-urgent requests or questions.  Due to long hold times on the telephone, sending your provider a message by [facility] may be a faster and more efficient way to get a response.  Please allow 48 business hours for a response.  Please remember that this is for non-urgent requests.  ?_______________________________________________________ ? ?You have been scheduled for a colonoscopy. Please follow written instructions given to you at your visit today.  ?Please pick up your prep supplies at the pharmacy within the next 1-3 days. ?If you use inhalers (even only as needed), please bring them with you on the day of your procedure. ? ?I appreciate the opportunity to care for you. ?Silvano Rusk, MD, [facility] ?

## 2021-10-16 NOTE — Progress Notes (Signed)
? ?Rodney Adkins 66 y.o. May 11, 1956 [id number] ? ?Assessment & Plan:  ? ?Encounter Diagnoses  ?Name Primary?  ? Colon cancer screening Yes  ? Multiple sclerosis ([place])   ? Pacemaker-dual-chamber Medtronic   ? History of stroke   ? ? ?The patient is an appropriate candidate for screening colonoscopy in our ambulatory endoscopy center.  He has an appointment and prep instructions were given today.  The risks and benefits as well as alternatives of endoscopic procedure(s) have been discussed and reviewed. All questions answered. The patient agrees to proceed. ? ? ? ?Subjective:  ? ?Chief Complaint: Screening colonoscopy in the setting of neurologic disease ? ?HPI ?66 year old white man with a past medical history that includes multiple sclerosis and prior stroke (2021) who is here for evaluation prior to scheduling screening colonoscopy given his comorbidities.  The patient is functionally intact he continues to teach economics at [employer], he does ambulate with the help of walking sticks.  He has had no deterioration lately his neurologic illness is stable overall.  He is followed by a neurologist in [place] who is a multiple sclerosis expert.  He is not on any anticoagulants and there are no anesthesia issues that I am aware of.  He is moving his bowels without difficulty.  He had a negative colonoscopy in 2012.  He does have a pacemaker for complete heart block. ?No Known Allergies ?Current Meds  ?Medication Sig  ? aspirin 81 MG EC tablet Take 1 tablet by mouth daily.  ? BAFIERTAM 95 MG CPDR Take 95 mg by mouth in the morning and at bedtime.   ? Multiple Vitamin (MULTIVITAMIN) tablet Take 1 tablet by mouth daily.  ? rosuvastatin (CRESTOR) 20 MG tablet Take 20 mg by mouth at bedtime.  ? ?Past Medical History:  ?Diagnosis Date  ? Complete heart block [facility]) June 2009  ? a. Presented with hypotension/pulm edema 12/2007 when HOCM was also discovered. s/p dual chamber MDT pacemaker. Thallium stest 7/09  negative for infiltrative disease or ischemia.  Followed Dr [name]  ? Heart murmur   ? HOCM (hypertrophic obstructive cardiomyopathy) ([place]) 2009  ? Discovered by ECHO when he developed CHB. Mgmt Dr Caryl Comes  and Dr [name] at [facility]. Has outflow obstruction on ECHO 2011  ? Multiple sclerosis ([place]) 1998  ? Sxs started 1998 with L body numbness. MRI c/w MS. Started Avonex. Saw Dr [name] at [facility] and switched to Betaseron and was on for 5 yrs but developed depression and site rxns and D/C'd 2006. While on Betaseron, occassionally required solumedrol for flares.  ? Pacemaker 2017  ? Prostate enlargement   ? Right bundle branch block   ? SOB (shortness of breath)   ? a. PFTs 09/2010 essentially normal per pulm note (mild obst defect). b. 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  ? Wenckebach   ? a. Decreased ex tolerance 2010/2012 - underwent stress echo to look @ effects of MV/LVOT - nothing noted; although pt had high rate Wenckebach behavior with associated ex intol. Pacer reprogrammed with improvement in sx.  ? ?Past Surgical History:  ?Procedure Laterality Date  ? LEFT HEART CATHETERIZATION WITH CORONARY ANGIOGRAM N/A 08/22/2013  ? Procedure: LEFT HEART CATHETERIZATION WITH CORONARY ANGIOGRAM;  Surgeon: [name], MD;  Location: [facility] CATH LAB;  Service: Cardiovascular;  Laterality: N/A;  ? LEG TENDON SURGERY  2008  ? Os peroneus resection and peroneal tendon repair  ? pace maker    ? PACEMAKER INSERTION  12/2007  ?  Complete heart block  ? PACEMAKER LEAD REMOVAL Left 07/18/2015  ? Procedure: ATRIAL PACEMAKER LEAD EXTRACTION; ATRIAL PACEMAKER LEAD INSERTION; GENERATOR REPLACEMENT.;  Surgeon: [name], MD;  Location: [place];  Service: Cardiovascular;  Laterality: Left;  [name] to back up  ? PERIPHERAL VASCULAR CATHETERIZATION N/A 02/12/2015  ? Procedure: Venogram;  Surgeon: [name], MD;  Location: [place] CV LAB;  Service: Cardiovascular;  Laterality: N/A;  ? TEE WITHOUT CARDIOVERSION N/A 07/18/2015   ? Procedure: TRANSESOPHAGEAL ECHOCARDIOGRAM (TEE);  Surgeon: [name], MD;  Location: [place];  Service: Cardiovascular;  Laterality: N/A;  ? ?Social History  ? ?Social History Narrative  ? 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  ? ?family history includes Colon polyps in his father; Diabetes in his paternal grandmother; Hypertension in his father. ? ? ?Review of Systems ?As per HPI otherwise negative ? ?Objective:  ? Physical Exam ?BP 116/82   Pulse 69   Ht '5\' 10"'$  (1.778 m)   Wt 134 lb 3.2 oz (60.9 kg)   SpO2 99%   BMI 19.26 kg/m?  ?NAD ?Lungs cta ?Cor NL s1s2 no rmg ?Abd thin soft and NT ?He is alert and oriented x3 and ambulates to the exam table without difficulty ? ?

## 2021-11-12 ENCOUNTER — Ambulatory Visit: Payer: BC Managed Care – PPO | Admitting: Internal Medicine

## 2021-11-18 ENCOUNTER — Ambulatory Visit (AMBULATORY_SURGERY_CENTER): Payer: BC Managed Care – PPO | Admitting: Internal Medicine

## 2021-11-18 ENCOUNTER — Encounter: Payer: Self-pay | Admitting: Internal Medicine

## 2021-11-18 VITALS — BP 116/66 | HR 69 | Temp 98.2°F | Resp 11 | Ht 70.0 in | Wt 127.0 lb

## 2021-11-18 DIAGNOSIS — Z1211 Encounter for screening for malignant neoplasm of colon: Secondary | ICD-10-CM | POA: Diagnosis present

## 2021-11-18 DIAGNOSIS — D123 Benign neoplasm of transverse colon: Secondary | ICD-10-CM

## 2021-11-18 DIAGNOSIS — D128 Benign neoplasm of rectum: Secondary | ICD-10-CM

## 2021-11-18 MED ORDER — SODIUM CHLORIDE 0.9 % IV SOLN: 500.0000 mL | Freq: Once | INTRAVENOUS | Status: DC

## 2021-11-18 NOTE — Op Note (Signed)
[place] ?Patient Name: Rodney Adkins ?Procedure Date: 11/18/2021 9:58 AM ?MRN: [id number] ?Endoscopist: Gatha Mayer , MD ?Age: 66 ?Referring MD:  ?Date of Birth: Mar 20, 1956 ?Gender: Male ?Account #: [id number] ?Procedure:                Colonoscopy ?Indications:              Screening for colorectal malignant neoplasm, Last  ?                          colonoscopy: 2012 ?Medicines:                Monitored Anesthesia Care ?Procedure:                Pre-Anesthesia Assessment: ?                          - Prior to the procedure, a History and Physical  ?                          was performed, and patient medications and  ?                          allergies were reviewed. The patient's tolerance of  ?                          previous anesthesia was also reviewed. The risks  ?                          and benefits of the procedure and the sedation  ?                          options and risks were discussed with the patient.  ?                          All questions were answered, and informed consent  ?                          was obtained. Prior Anticoagulants: The patient has  ?                          taken no previous anticoagulant or antiplatelet  ?                          agents. ASA Grade Assessment: III - A patient with  ?                          severe systemic disease. After reviewing the risks  ?                          and benefits, the patient was deemed in  ?                          satisfactory condition to undergo the procedure. ?  After obtaining informed consent, the colonoscope  ?                          was passed under direct vision. Throughout the  ?                          procedure, the patient's blood pressure, pulse, and  ?                          oxygen saturations were monitored continuously. The  ?                          Olympus CF-HQ190L ([id number]) Colonoscope was  ?                          introduced through the anus and advanced to  the the  ?                          cecum, identified by appendiceal orifice and  ?                          ileocecal valve. The colonoscopy was performed  ?                          without difficulty. The patient tolerated the  ?                          procedure well. The quality of the bowel  ?                          preparation was good. The bowel preparation used  ?                          was Miralax via split dose instruction. The  ?                          ileocecal valve, appendiceal orifice, and rectum  ?                          were photographed. ?Scope In: 10:02:53 AM ?Scope Out: 10:24:10 AM ?Scope Withdrawal Time: 0 hours 15 minutes 28 seconds  ?Total Procedure Duration: 0 hours 21 minutes 17 seconds  ?Findings:                 The perianal and digital rectal examinations were  ?                          normal. ?                          Two sessile and semi-pedunculated polyps were found  ?                          in the proximal rectum and transverse colon. The  ?  polyps were 3 to 7 mm in size. These polyps were  ?                          removed with a cold snare. Resection and retrieval  ?                          were complete. Verification of patient  ?                          identification for the specimen was done. Estimated  ?                          blood loss was minimal. ?                          The exam was otherwise without abnormality on  ?                          direct and retroflexion views. ?Complications:            No immediate complications. ?Estimated Blood Loss:     Estimated blood loss was minimal. ?Impression:               - Two 3 to 7 mm polyps in the proximal rectum and  ?                          in the transverse colon, removed with a cold snare.  ?                          Resected and retrieved. ?                          - The examination was otherwise normal on direct  ?                          and retroflexion  views. ?Recommendation:           - Patient has a contact number available for  ?                          emergencies. The signs and symptoms of potential  ?                          delayed complications were discussed with the  ?                          patient. Return to normal activities tomorrow.  ?                          Written discharge instructions were provided to the  ?                          patient. ?                          -  Resume previous diet. ?                          - Continue present medications. ?                          - Repeat colonoscopy is recommended. The  ?                          colonoscopy date will be determined after pathology  ?                          results from today's exam become available for  ?                          review. ?Gatha Mayer, MD ?11/18/2021 10:30:28 AM ?This report has been signed electronically. ?

## 2021-11-18 NOTE — Progress Notes (Signed)
Called to room to assist during endoscopic procedure.  Patient ID and intended procedure confirmed with present staff. Received instructions for my participation in the procedure from the performing physician.  

## 2021-11-18 NOTE — Progress Notes (Signed)
PT taken to PACU. Monitors in place. VSS. Report given to RN. 

## 2021-11-18 NOTE — Patient Instructions (Addendum)
I found and removed two small polyps. ?Both look benign. ? ?I will let you know pathology results and when to have another routine colonoscopy by mail and/or My Chart. ? ?I appreciate the opportunity to care for you. ?Gatha Mayer, MD, [facility] ? ?YOU HAD AN ENDOSCOPIC PROCEDURE TODAY: Refer to the procedure report and other information in the discharge instructions given to you for any specific questions about what was found during the examination. If this information does not answer your questions, please call [place] office at [phone] to clarify.  ? ?YOU SHOULD EXPECT: Some feelings of bloating in the abdomen. Passage of more gas than usual. Walking can help get rid of the air that was put into your GI tract during the procedure and reduce the bloating. If you had a lower endoscopy (such as a colonoscopy or flexible sigmoidoscopy) you may notice spotting of blood in your stool or on the toilet paper. Some abdominal soreness may be present for a day or two, also. ? ?DIET: Your first meal following the procedure should be a light meal and then it is ok to progress to your normal diet. A half-sandwich or bowl of soup is an example of a good first meal. Heavy or fried foods are harder to digest and may make you feel nauseous or bloated. Drink plenty of fluids but you should avoid alcoholic beverages for 24 hours. If you had a esophageal dilation, please see attached instructions for diet.   ? ?ACTIVITY: Your care partner should take you home directly after the procedure. You should plan to take it easy, moving slowly for the rest of the day. You can resume normal activity the day after the procedure however YOU SHOULD NOT DRIVE, use power tools, machinery or perform tasks that involve climbing or major physical exertion for 24 hours (because of the sedation medicines used during the test).  ? ?SYMPTOMS TO REPORT IMMEDIATELY: ?A gastroenterologist can be reached at any hour. Please call [phone]  for any of  the following symptoms:  ?Following lower endoscopy (colonoscopy, flexible sigmoidoscopy) ?Excessive amounts of blood in the stool  ?Significant tenderness, worsening of abdominal pains  ?Swelling of the abdomen that is new, acute  ?Fever of 100? or higher  ?FOLLOW UP:  ?If any biopsies were taken you will be contacted by phone or by letter within the next 1-3 weeks. Call [phone]  if you have not heard about the biopsies in 3 weeks.  ?Please also call with any specific questions about appointments or follow up tests.  ?

## 2021-11-18 NOTE — Progress Notes (Signed)
[facility] History and Physical ? ? ?Primary Care Physician:  [name], MD ? ? ?Reason for Procedure:   CRCA screening ? ?Plan:    colonoscopy ? ? ? ? ?HPI: Rodney Adkins is a 66 y.o. male with a past medical history that includes multiple sclerosis and prior stroke (2021) who is here for evaluation prior to scheduling screening colonoscopy given his comorbidities. The patient is functionally intact he continues to teach economics at [employer], he does ambulate with the help of walking sticks. He has had no deterioration lately his neurologic illness is stable overall. He is followed by a neurologist in [place] who is a multiple sclerosis expert. He is not on any anticoagulants and there are no anesthesia issues that I am aware of. He is moving his bowels without difficulty. He had a negative colonoscopy in 2012. He does have a pacemaker for complete heart block.  ? ? ? ?Past Medical History:  ?Diagnosis Date  ? Complete heart block [facility]) June 2009  ? a. Presented with hypotension/pulm edema 12/2007 when HOCM was also discovered. s/p dual chamber MDT pacemaker. Thallium stest 7/09 negative for infiltrative disease or ischemia.  Followed Dr [name]  ? Heart murmur   ? HOCM (hypertrophic obstructive cardiomyopathy) ([place]) 2009  ? Discovered by ECHO when he developed CHB. Mgmt Dr Caryl Comes  and Dr [name] at [facility]. Has outflow obstruction on ECHO 2011  ? Multiple sclerosis ([place]) 1998  ? Sxs started 1998 with L body numbness. MRI c/w MS. Started Avonex. Saw Dr [name] at [facility] and switched to Betaseron and was on for 5 yrs but developed depression and site rxns and D/C'd 2006. While on Betaseron, occassionally required solumedrol for flares.  ? Pacemaker 2017  ? Prostate enlargement   ? Right bundle branch block   ? SOB (shortness of breath)   ? a. PFTs 09/2010 essentially normal per pulm note (mild obst defect). b. 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  ? Wenckebach   ? a.  Decreased ex tolerance 2010/2012 - underwent stress echo to look @ effects of MV/LVOT - nothing noted; although pt had high rate Wenckebach behavior with associated ex intol. Pacer reprogrammed with improvement in sx.  ? ? ?Past Surgical History:  ?Procedure Laterality Date  ? LEFT HEART CATHETERIZATION WITH CORONARY ANGIOGRAM N/A 08/22/2013  ? Procedure: LEFT HEART CATHETERIZATION WITH CORONARY ANGIOGRAM;  Surgeon: [name], MD;  Location: [facility] CATH LAB;  Service: Cardiovascular;  Laterality: N/A;  ? LEG TENDON SURGERY  2008  ? Os peroneus resection and peroneal tendon repair  ? pace maker    ? PACEMAKER INSERTION  12/2007  ? Complete heart block  ? PACEMAKER LEAD REMOVAL Left 07/18/2015  ? Procedure: ATRIAL PACEMAKER LEAD EXTRACTION; ATRIAL PACEMAKER LEAD INSERTION; GENERATOR REPLACEMENT.;  Surgeon: [name], MD;  Location: [place];  Service: Cardiovascular;  Laterality: Left;  [name] to back up  ? PERIPHERAL VASCULAR CATHETERIZATION N/A 02/12/2015  ? Procedure: Venogram;  Surgeon: [name], MD;  Location: [place] CV LAB;  Service: Cardiovascular;  Laterality: N/A;  ? TEE WITHOUT CARDIOVERSION N/A 07/18/2015  ? Procedure: TRANSESOPHAGEAL ECHOCARDIOGRAM (TEE);  Surgeon: [name], MD;  Location: [place];  Service: Cardiovascular;  Laterality: N/A;  ? ? ?Prior to Admission medications   ?Medication Sig Start Date End Date Taking? Authorizing Provider  ?aspirin 81 MG EC tablet Take 1 tablet by mouth daily.   Yes [provider]  ?Juleen Starr  95 MG CPDR Take 95 mg by mouth in the morning and at bedtime.  04/24/20  Yes [provider]  ?Multiple Vitamin (MULTIVITAMIN) tablet Take 1 tablet by mouth daily.   Yes [provider]  ?rosuvastatin (CRESTOR) 20 MG tablet Take 20 mg by mouth at bedtime. 09/16/21  Yes [provider]  ? ? ?Current Outpatient Medications  ?Medication Sig Dispense Refill  ? aspirin 81 MG EC tablet Take 1 tablet by mouth daily.    ? BAFIERTAM 95  MG CPDR Take 95 mg by mouth in the morning and at bedtime.     ? Multiple Vitamin (MULTIVITAMIN) tablet Take 1 tablet by mouth daily.    ? rosuvastatin (CRESTOR) 20 MG tablet Take 20 mg by mouth at bedtime.    ? ?Current Facility-Administered Medications  ?Medication Dose Route Frequency Provider Last Rate Last Admin  ? 0.9 %  sodium chloride infusion  500 mL Intravenous Once Gatha Mayer, MD      ? ? ?Allergies as of 11/18/2021  ? (No Known Allergies)  ? ? ?Family History  ?Problem Relation Age of Onset  ? Colon polyps Father   ? Hypertension Father   ? Diabetes Paternal Grandmother   ? Heart disease Neg Hx   ? Colon cancer Neg Hx   ? Stomach cancer Neg Hx   ? Esophageal cancer Neg Hx   ? Rectal cancer Neg Hx   ? ? ?Social History  ? ?Socioeconomic History  ? Marital status: Married  ?  Spouse name: Not on file  ? Number of children: 0  ? Years of education: Not on file  ? Highest education level: Not on file  ?Occupational History  ? Occupation: professor  ?  Employer: [employer]  ?  Comment: Statistics  ?Tobacco Use  ? Smoking status: Never  ? Smokeless tobacco: Never  ?Vaping Use  ? Vaping Use: Never used  ?Substance and Sexual Activity  ? Alcohol use: No  ?  Alcohol/week: 0.0 standard drinks  ? Drug use: No  ? Sexual activity: Not Currently  ?Other Topics Concern  ? Not on file  ?Social History Narrative  ? 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  ? ?Social Determinants of Health  ? ?Financial Resource Strain: Not on file  ?Food Insecurity: Not on file  ?Transportation Needs: Not on file  ?Physical Activity: Not on file  ?Stress: Not on file  ?Social Connections: Not on file  ?Intimate Partner Violence: Not on file  ? ? ?Review of Systems: ? ?All other review of systems negative except as mentioned in the HPI. ? ?Physical Exam: ?Vital signs ?BP (!) 108/59   Pulse 70   Temp 98.2 ?F (36.8 ?C)   Ht '5\' 10"'$  (1.778 m)   Wt 127 lb (57.6 kg)   SpO2 100%   BMI 18.22 kg/m?  ? ?General:   Alert,   Well-developed, well-nourished, pleasant and cooperative in NAD ?Lungs:  Clear throughout to auscultation.   ?Heart:  Regular rate and rhythm; no murmurs, clicks, rubs,  or gallops. ?Abdomen:  Soft, nontender and nondistended. Normal bowel sounds.   ?Neuro/Psych:  Alert and cooperative. Normal mood and affect. A and O x 3 ? ? ?'@Fritz Cauthon'$  Simonne Maffucci, MD, [facility] ?[facility] ?[phone] (pager) ?11/18/2021 9:53 AM@ ? ?

## 2021-11-20 ENCOUNTER — Telehealth: Payer: Self-pay

## 2021-11-20 NOTE — Telephone Encounter (Signed)
  Follow up Call-     11/18/2021    9:12 AM  Call back number  Post procedure Call Back phone  # [phone]  Permission to leave phone message Yes     Patient questions:  Do you have a fever, pain , or abdominal swelling? No. Pain Score  0 *  Have you tolerated food without any problems? Yes.    Have you been able to return to your normal activities? Yes.    Do you have any questions about your discharge instructions: Diet   No. Medications  No. Follow up visit  No.  Do you have questions or concerns about your Care? No.  Actions: * If pain score is 4 or above: No action needed, pain <4.

## 2021-11-27 ENCOUNTER — Encounter: Payer: Self-pay | Admitting: Internal Medicine

## 2021-11-27 DIAGNOSIS — Z86010 Personal history of colonic polyps: Secondary | ICD-10-CM

## 2021-11-27 DIAGNOSIS — Z860101 Personal history of adenomatous and serrated colon polyps: Secondary | ICD-10-CM

## 2021-11-27 HISTORY — DX: Personal history of colonic polyps: Z86.010

## 2021-11-27 HISTORY — DX: Personal history of adenomatous and serrated colon polyps: Z86.0101

## 2021-12-10 ENCOUNTER — Encounter: Payer: Self-pay | Admitting: *Deleted

## 2021-12-11 ENCOUNTER — Ambulatory Visit (INDEPENDENT_AMBULATORY_CARE_PROVIDER_SITE_OTHER): Payer: BC Managed Care – PPO

## 2021-12-11 DIAGNOSIS — I442 Atrioventricular block, complete: Secondary | ICD-10-CM

## 2021-12-14 LAB — CUP PACEART REMOTE DEVICE CHECK
Battery Remaining Longevity: 35 mo
Battery Voltage: 2.96 V
Brady Statistic AP VP Percent: 13.11 %
Brady Statistic AP VS Percent: 0 %
Brady Statistic AS VP Percent: 86.89 %
Brady Statistic AS VS Percent: 0 %
Brady Statistic RA Percent Paced: 13.11 %
Brady Statistic RV Percent Paced: 99.99 %
Date Time Interrogation Session: 20230609172559
Implantable Lead Implant Date: 20090608
Implantable Lead Implant Date: 20170112
Implantable Lead Location: 753859
Implantable Lead Location: 753860
Implantable Lead Model: 5076
Implantable Lead Model: 5076
Implantable Pulse Generator Implant Date: 20170112
Lead Channel Impedance Value: 342 Ohm
Lead Channel Impedance Value: 380 Ohm
Lead Channel Impedance Value: 437 Ohm
Lead Channel Impedance Value: 551 Ohm
Lead Channel Pacing Threshold Amplitude: 0.5 V
Lead Channel Pacing Threshold Amplitude: 0.875 V
Lead Channel Pacing Threshold Pulse Width: 0.4 ms
Lead Channel Pacing Threshold Pulse Width: 0.4 ms
Lead Channel Sensing Intrinsic Amplitude: 18.125 mV
Lead Channel Sensing Intrinsic Amplitude: 18.125 mV
Lead Channel Sensing Intrinsic Amplitude: 2.875 mV
Lead Channel Sensing Intrinsic Amplitude: 2.875 mV
Lead Channel Setting Pacing Amplitude: 2 V
Lead Channel Setting Pacing Amplitude: 2.5 V
Lead Channel Setting Pacing Pulse Width: 0.4 ms
Lead Channel Setting Sensing Sensitivity: 5.6 mV

## 2021-12-19 NOTE — Progress Notes (Signed)
Remote pacemaker transmission.   

## 2022-03-12 ENCOUNTER — Ambulatory Visit (INDEPENDENT_AMBULATORY_CARE_PROVIDER_SITE_OTHER): Payer: BC Managed Care – PPO

## 2022-03-12 DIAGNOSIS — I442 Atrioventricular block, complete: Secondary | ICD-10-CM | POA: Diagnosis not present

## 2022-03-17 LAB — CUP PACEART REMOTE DEVICE CHECK
Battery Remaining Longevity: 35 mo
Battery Voltage: 2.96 V
Brady Statistic AP VP Percent: 10.19 %
Brady Statistic AP VS Percent: 0 %
Brady Statistic AS VP Percent: 89.81 %
Brady Statistic AS VS Percent: 0 %
Brady Statistic RA Percent Paced: 10.19 %
Brady Statistic RV Percent Paced: 99.99 %
Date Time Interrogation Session: 20230907231818
Implantable Lead Implant Date: 20090608
Implantable Lead Implant Date: 20170112
Implantable Lead Location: 753859
Implantable Lead Location: 753860
Implantable Lead Model: 5076
Implantable Lead Model: 5076
Implantable Pulse Generator Implant Date: 20170112
Lead Channel Impedance Value: 323 Ohm
Lead Channel Impedance Value: 361 Ohm
Lead Channel Impedance Value: 437 Ohm
Lead Channel Impedance Value: 513 Ohm
Lead Channel Pacing Threshold Amplitude: 0.625 V
Lead Channel Pacing Threshold Amplitude: 1.125 V
Lead Channel Pacing Threshold Pulse Width: 0.4 ms
Lead Channel Pacing Threshold Pulse Width: 0.4 ms
Lead Channel Sensing Intrinsic Amplitude: 19.125 mV
Lead Channel Sensing Intrinsic Amplitude: 19.125 mV
Lead Channel Sensing Intrinsic Amplitude: 2.625 mV
Lead Channel Sensing Intrinsic Amplitude: 2.625 mV
Lead Channel Setting Pacing Amplitude: 2 V
Lead Channel Setting Pacing Amplitude: 2.5 V
Lead Channel Setting Pacing Pulse Width: 0.4 ms
Lead Channel Setting Sensing Sensitivity: 5.6 mV

## 2022-03-28 NOTE — Progress Notes (Signed)
Remote pacemaker transmission.   

## 2022-04-29 ENCOUNTER — Ambulatory Visit: Payer: BC Managed Care – PPO | Admitting: Internal Medicine

## 2022-04-29 DIAGNOSIS — Z950 Presence of cardiac pacemaker: Secondary | ICD-10-CM

## 2022-04-29 DIAGNOSIS — I421 Obstructive hypertrophic cardiomyopathy: Secondary | ICD-10-CM

## 2022-04-29 DIAGNOSIS — I442 Atrioventricular block, complete: Secondary | ICD-10-CM

## 2022-06-03 ENCOUNTER — Encounter: Payer: BC Managed Care – PPO | Admitting: Internal Medicine

## 2022-06-11 ENCOUNTER — Ambulatory Visit (INDEPENDENT_AMBULATORY_CARE_PROVIDER_SITE_OTHER): Payer: BC Managed Care – PPO

## 2022-06-11 DIAGNOSIS — I442 Atrioventricular block, complete: Secondary | ICD-10-CM

## 2022-06-12 LAB — CUP PACEART REMOTE DEVICE CHECK
Battery Remaining Longevity: 33 mo
Battery Voltage: 2.95 V
Brady Statistic AP VP Percent: 9.11 %
Brady Statistic AP VS Percent: 0 %
Brady Statistic AS VP Percent: 90.88 %
Brady Statistic AS VS Percent: 0.01 %
Brady Statistic RA Percent Paced: 9.11 %
Brady Statistic RV Percent Paced: 99.98 %
Date Time Interrogation Session: 20231208142135
Implantable Lead Connection Status: 753985
Implantable Lead Connection Status: 753985
Implantable Lead Implant Date: 20090608
Implantable Lead Implant Date: 20170112
Implantable Lead Location: 753859
Implantable Lead Location: 753860
Implantable Lead Model: 5076
Implantable Lead Model: 5076
Implantable Pulse Generator Implant Date: 20170112
Lead Channel Impedance Value: 323 Ohm
Lead Channel Impedance Value: 380 Ohm
Lead Channel Impedance Value: 456 Ohm
Lead Channel Impedance Value: 551 Ohm
Lead Channel Pacing Threshold Amplitude: 0.625 V
Lead Channel Pacing Threshold Amplitude: 0.875 V
Lead Channel Pacing Threshold Pulse Width: 0.4 ms
Lead Channel Pacing Threshold Pulse Width: 0.4 ms
Lead Channel Sensing Intrinsic Amplitude: 2.875 mV
Lead Channel Sensing Intrinsic Amplitude: 2.875 mV
Lead Channel Sensing Intrinsic Amplitude: 24.625 mV
Lead Channel Sensing Intrinsic Amplitude: 24.625 mV
Lead Channel Setting Pacing Amplitude: 2 V
Lead Channel Setting Pacing Amplitude: 2.5 V
Lead Channel Setting Pacing Pulse Width: 0.4 ms
Lead Channel Setting Sensing Sensitivity: 5.6 mV
Zone Setting Status: 755011
Zone Setting Status: 755011

## 2022-06-24 ENCOUNTER — Encounter: Payer: Self-pay | Admitting: Internal Medicine

## 2022-06-24 ENCOUNTER — Ambulatory Visit: Payer: BC Managed Care – PPO | Attending: Internal Medicine | Admitting: Internal Medicine

## 2022-06-24 VITALS — BP 110/64 | HR 83 | Ht 70.0 in | Wt 132.4 lb

## 2022-06-24 DIAGNOSIS — I421 Obstructive hypertrophic cardiomyopathy: Secondary | ICD-10-CM

## 2022-06-24 NOTE — Patient Instructions (Signed)
Medication Instructions:  Your physician recommends that you continue on your current medications as directed. Please refer to the Current Medication list given to you today.  *If you need a refill on your cardiac medications before your next appointment, please call your pharmacy*   Lab Work: None ordered.  If you have labs (blood work) drawn today and your tests are completely normal, you will receive your results only by: MyChart Message (if you have MyChart) OR A paper copy in the mail If you have any lab test that is abnormal or we need to change your treatment, we will call you to review the results.   Testing/Procedures: None ordered.    Follow-Up: At [facility], you and your health needs are our priority.  As part of our continuing mission to provide you with exceptional heart care, we have created designated Provider Care Teams.  These Care Teams include your primary Cardiologist (physician) and Advanced Practice Providers (APPs -  Physician Assistants and Nurse Practitioners) who all work together to provide you with the care you need, when you need it.  We recommend signing up for the patient portal called "MyChart".  Sign up information is provided on this After Visit Summary.  MyChart is used to connect with patients for Virtual Visits (Telemedicine).  Patients are able to view lab/test results, encounter notes, upcoming appointments, etc.  Non-urgent messages can be sent to your provider as well.   To learn more about what you can do with MyChart, go to [email or web address].    Your next appointment:   12 months with Dr Klein  Important Information About Sugar       

## 2022-06-24 NOTE — Progress Notes (Signed)
Patient ID: Rodney Adkins, male   DOB: [birth year], 66 y.o.   MRN: [id number]        Patient Care Team: [name], MD as PCP - General [name], MD as Consulting Physician (Gastroenterology) [name], MD as Consulting Physician (Cardiology)   HPI  Rodney Adkins is a 66 y.o. male Seen in followup for HTN with complete heart block status post pacing in the setting of hypertrophic obstructive cardiomyopathy.  He has been seen again recently because of exercise intolerance previously had been associated with upper rate Wenckebach behavior. Because of episodes of chest pain he underwent catheterization demonstrating no obstructive coronary disease but there were gradients.LVEDP>>5  He had a broken atrial lead. He saw both Dr. Elliot Cousin and [facility] and elected to have his extraction done here. It was done by Dr. Lovena Le 1/17   4/17 Echo LVEF 60-65% with gradeint resting 29  Not  Exercised.  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   Interval "stroke"11/21 presenting with slurred speech.  MRI imaging demonstrated "acute 12 mm diffusion restricted lesion of the left corona radiata "radiological impression was demyelination over stroke.  Reviewing the discharge summary ultimately the feeling it was related to stroke and not an MS flare.    He underwent extensive therapy including speech without significant improvement He have some concerns of dysphasia  He is concerned with his speech articulation and pronunciation    there is  associated depression for which  he was started on Wellbutrin this was stopped also without improvement    Denies chest pain or edema.  Still shortness of breath with activity.  No palpitations.  Speech has improved some.  He returned to teaching in the fall.     DATE TEST EF   4/17 Echo   60-65 %   4/18 Echo    Grad >>50 mm  11/21 Echo 60-65%    Date Cr             K        Hgb   1/20 0. 2 yearly    15.0 (7/18)   11/21   3.7   14.9 (  )     11/ 21 (CE) 0.81 4.4 15.2    5/23 0.8 4.9 14.7     Past Medical History:  Diagnosis Date   Complete heart block [facility]) June 2009   a. Presented with hypotension/pulm edema 12/2007 when HOCM was also discovered. s/p dual chamber MDT pacemaker. Thallium stest 7/09 negative for infiltrative disease or ischemia.  Followed Dr [name]   Heart murmur    HOCM (hypertrophic obstructive cardiomyopathy) ([place]) 2009   Discovered by ECHO when he developed CHB. Mgmt Dr Caryl Comes  and Dr [name] at [facility]. Has outflow obstruction on ECHO 2011   Hx of adenomatous colonic polyps 11/27/2021   Multiple sclerosis ([place]) 1998   Sxs started 1998 with L body numbness. MRI c/w MS. Started Avonex. Saw Dr [name] at [facility] and switched to Betaseron and was on for 5 yrs but developed depression and site rxns and D/C'd 2006. While on Betaseron, occassionally required solumedrol for flares.   Pacemaker 2017   Prostate enlargement    Right bundle branch block    SOB (shortness of breath)    a. PFTs 09/2010 essentially normal per pulm note (mild obst defect). b. 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   Wenckebach  a. Decreased ex tolerance 2010/2012 - underwent stress echo to look @ effects of MV/LVOT - nothing noted; although pt had high rate Wenckebach behavior with associated ex intol. Pacer reprogrammed with improvement in sx.    Past Surgical History:  Procedure Laterality Date   LEFT HEART CATHETERIZATION WITH CORONARY ANGIOGRAM N/A 08/22/2013   Procedure: LEFT HEART CATHETERIZATION WITH CORONARY ANGIOGRAM;  Surgeon: [name], MD;  Location: [facility] CATH LAB;  Service: Cardiovascular;  Laterality: N/A;   LEG TENDON SURGERY  2008   Os peroneus resection and peroneal tendon repair   pace maker     PACEMAKER INSERTION  12/2007   Complete heart block   PACEMAKER LEAD REMOVAL Left 07/18/2015   Procedure: ATRIAL PACEMAKER LEAD  EXTRACTION; ATRIAL PACEMAKER LEAD INSERTION; GENERATOR REPLACEMENT.;  Surgeon: [name], MD;  Location: [place];  Service: Cardiovascular;  Laterality: Left;  [name] to back up   PERIPHERAL VASCULAR CATHETERIZATION N/A 02/12/2015   Procedure: Venogram;  Surgeon: [name], MD;  Location: [place] CV LAB;  Service: Cardiovascular;  Laterality: N/A;   TEE WITHOUT CARDIOVERSION N/A 07/18/2015   Procedure: TRANSESOPHAGEAL ECHOCARDIOGRAM (TEE);  Surgeon: [name], MD;  Location: [facility] OR;  Service: Cardiovascular;  Laterality: N/A;    Current Outpatient Medications  Medication Sig Dispense Refill   aspirin 81 MG EC tablet Take 1 tablet by mouth daily.     BAFIERTAM 95 MG CPDR Take 95 mg by mouth in the morning and at bedtime.      Multiple Vitamin (MULTIVITAMIN) tablet Take 1 tablet by mouth daily.     rosuvastatin (CRESTOR) 20 MG tablet Take 20 mg by mouth at bedtime.     No current facility-administered medications for this visit.    No Known Allergies  Review of Systems negative except from HPI and PMH  Physical Exam: BP 110/64   Pulse 83   Ht '5\' 10"'$  (1.778 m)   Wt 132 lb 6.4 oz (60.1 kg)   SpO2 99%   BMI 19.00 kg/m  Well developed and well nourished in no acute distress HENT normal Neck supple with JVP-flat Clear Device pocket well healed; without hematoma or erythema.  There is no tethering  Regular rate and rhythm, no murmur Abd-soft with active BS No Clubbing cyanosis  edema Skin-warm and dry A & Oriented  Grossly normal sensory and motor function  ECG sinus with P synchronous pacing at 80  Device function is  normal. Programming changes   See Paceart for details    Assessment and  Plan  HCM  Complete Heart Block  Pacer Medtronic     Dyspnea on exertion  Multiple sclerosis   Continue on antiplatelet therapy and statins.  No evidence of atrial fibrillation on his device.  Will reach out to Dr. Ezra Sites to ask the question as to whether  a stress echo might be of value in looking at his dyspnea and whether he might be a candidate for mevacamten  Long discussion about his debility and the implication of his not having AF

## 2022-06-27 IMAGING — MR MR HEAD WO/W CM
10 of 21 series · 23 of 48 positions shown · IV contrast (Yes   MULTIHANCE)
Comparison: 05/15/2020.  07/11/2018.

CLINICAL DATA: Acute presentation with slurred speech and
right-sided weakness yesterday. History of multiple sclerosis.
Question multiple sclerosis versus stroke.



[Series 6: DWI · axial · 3.0mm · 1.09mm/px · z∈[-80,+72]mm · 3 of 104 slices shown (1 of 4)]
[im 1/104]
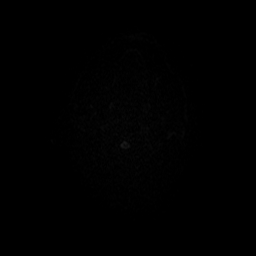
[im 52/104]
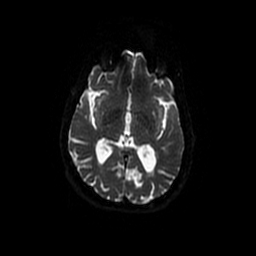
[im 104/104]
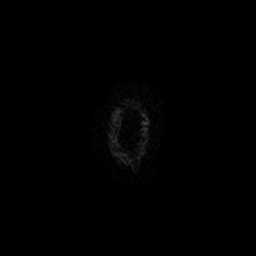

[Series 8: DWI · coronal · 5.0mm · 1.09mm/px · 3 of 80 slices shown (2 of 4)]
[im 1/80]
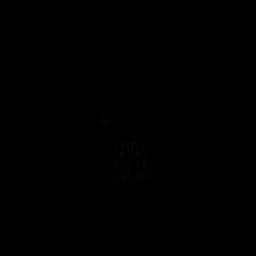
[im 40/80]
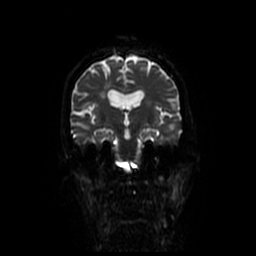
[im 80/80]
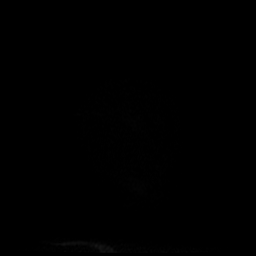

[Series 9: T2 · axial · 5.0mm · 0.43mm/px · 1 of 25 slices shown]
[im 1/25]
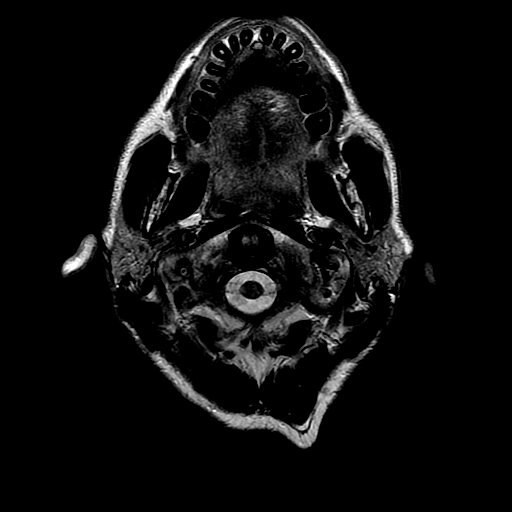

[Series 10: FLAIR · axial · 3.0mm · 0.43mm/px · 1 of 25 slices shown (1 of 2)]
[im 1/25]
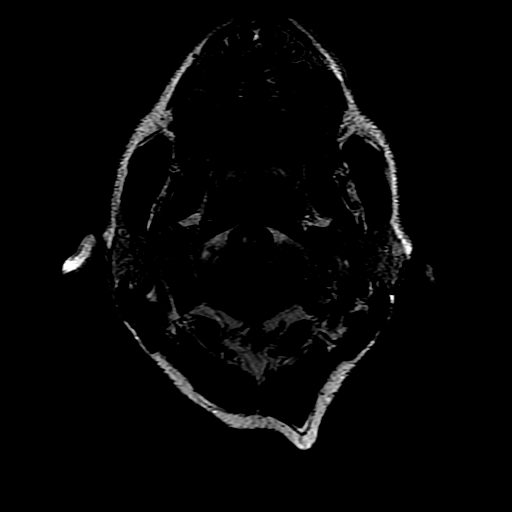

[Series 24: FLAIR · sagittal · 1.2mm · 0.49mm/px · 8 of 264 slices shown (2 of 2)]
[im 1/264]
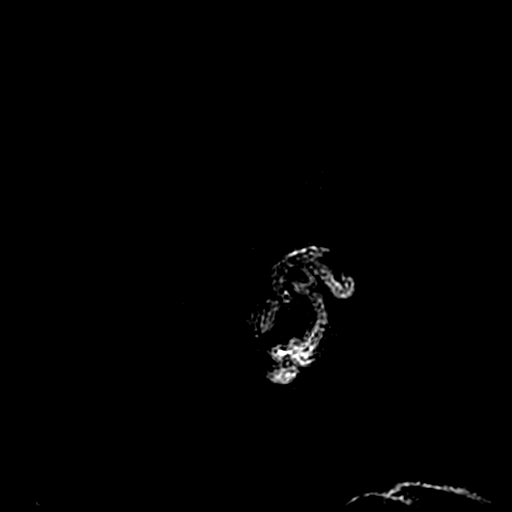
[im 30/264]
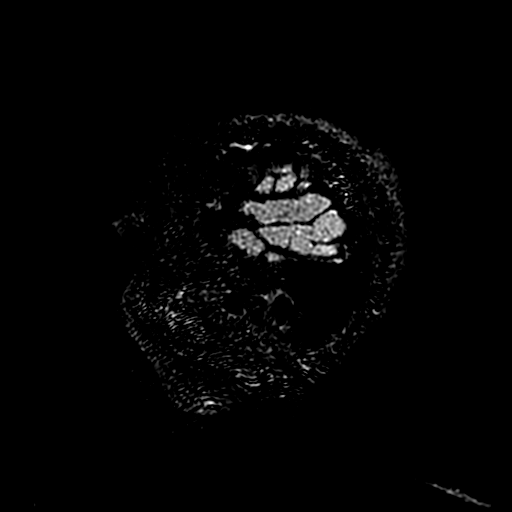
[im 88/264]
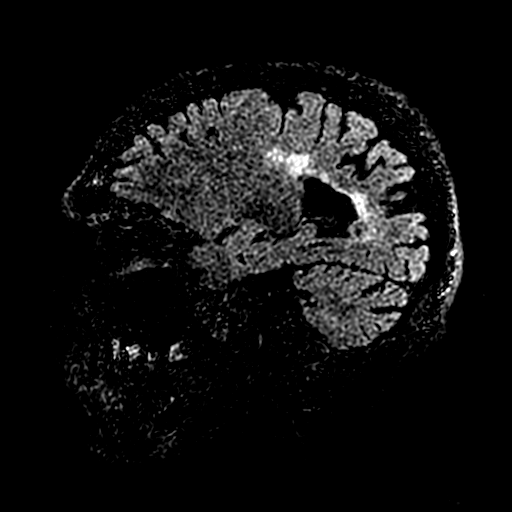
[im 117/264]
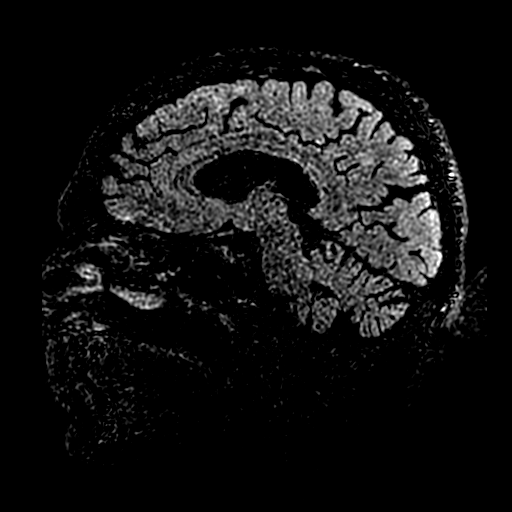
[im 147/264]
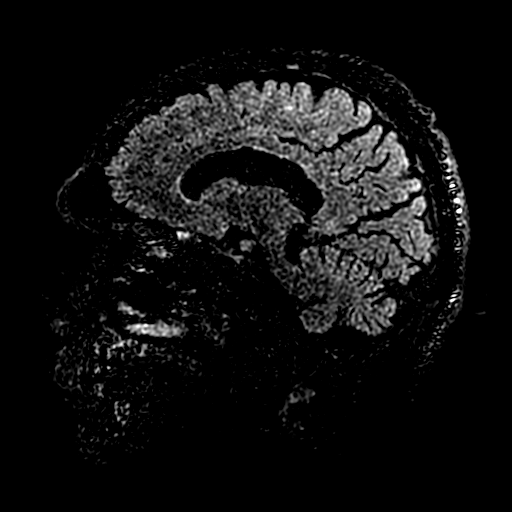
[im 176/264]
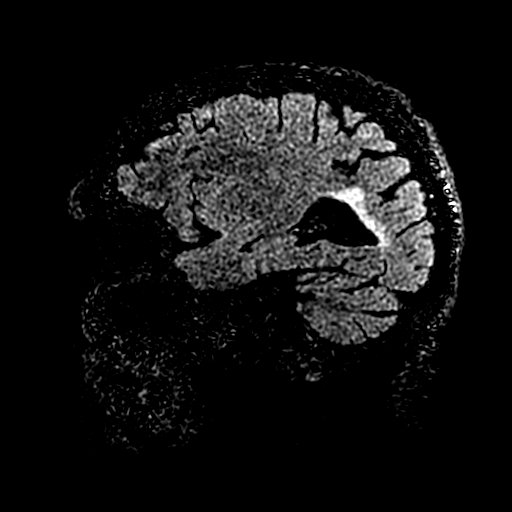
[im 234/264]
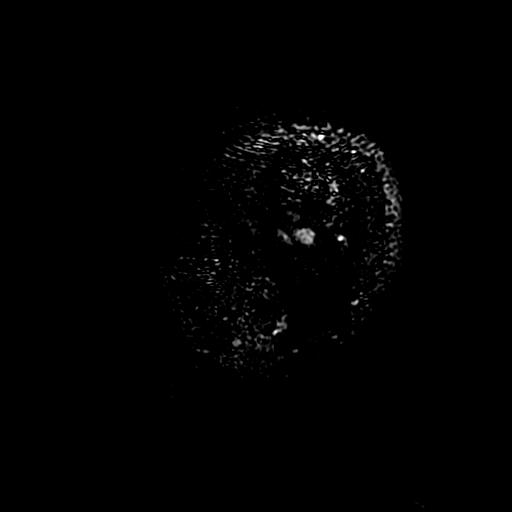
[im 264/264]
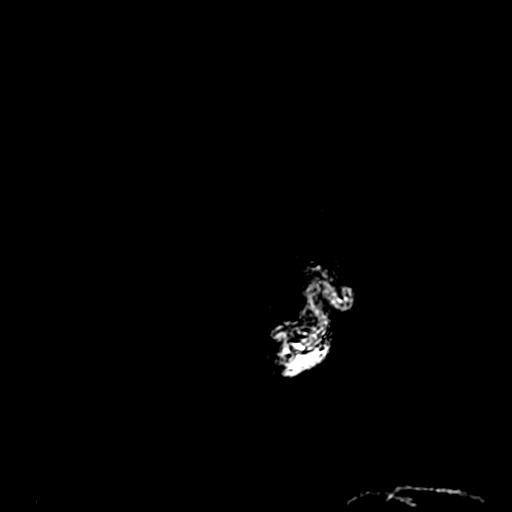

[Series 32: T1 post-contrast · coronal · 5.0mm · 0.39mm/px · 1 of 29 slices shown (1 of 2)]
[im 1/29]
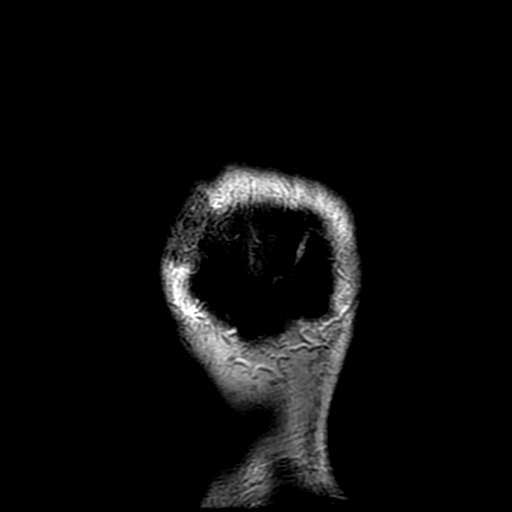

[Series 33: T1 post-contrast · axial · 3.0mm · 0.47mm/px · z∈[-47,+99]mm · 2 of 50 slices shown (2 of 2)]
[im 1/50]
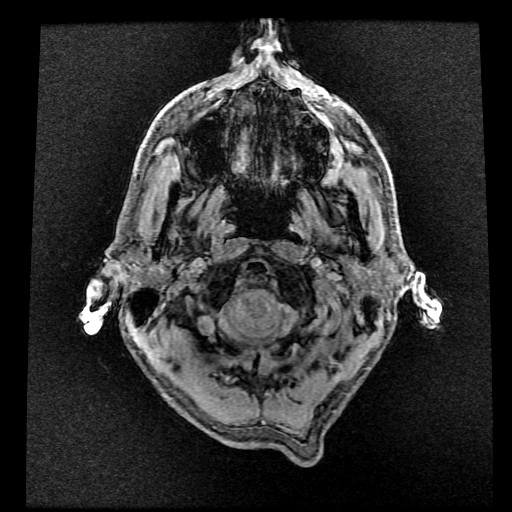
[im 50/50]
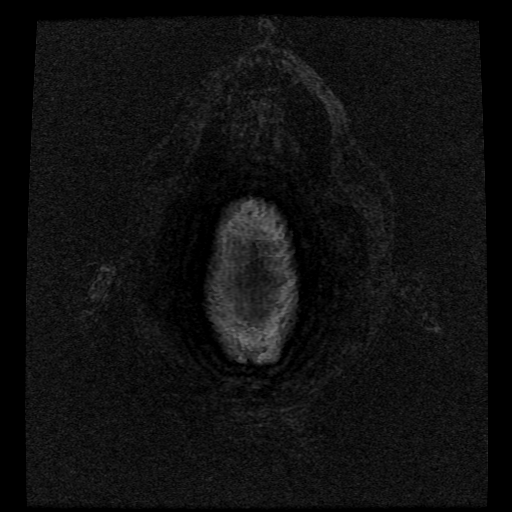

[Series 34: T2 post-contrast · coronal · 5.0mm · 0.39mm/px · 1 of 29 slices shown]
[im 1/29]
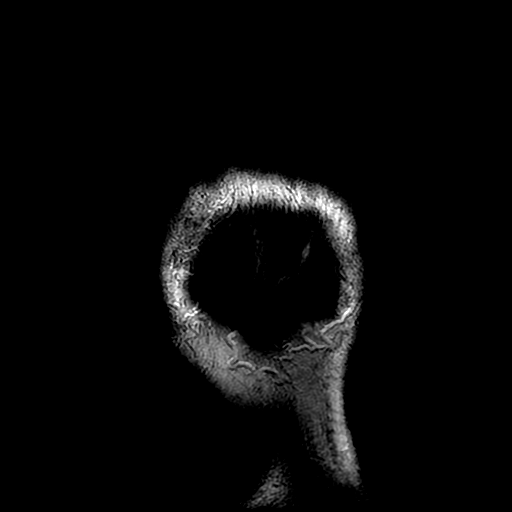

[Series 600: DWI · axial · 3.0mm · 1.09mm/px · z∈[-80,+72]mm · 2 of 52 slices shown (3 of 4)]
[im 1/52]
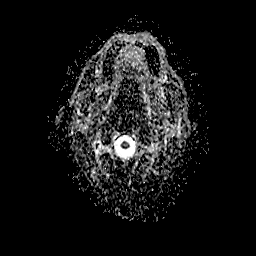
[im 52/52]
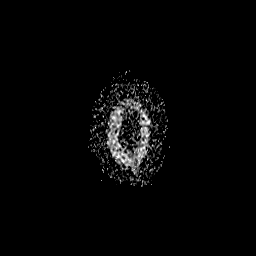

[Series 800: DWI · coronal · 5.0mm · 1.09mm/px · 1 of 39 slices shown (4 of 4)]
[im 1/39]
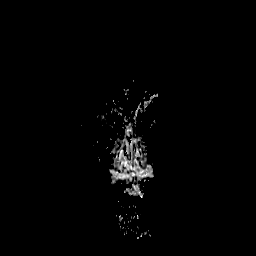

[23 of 48 positions shown; findings below may reference images not displayed]

FINDINGS: MRI HEAD FINDINGS

Brain: Previously seen demyelinating focus within the right side of
the pons is stable. Cerebellar atrophy without focal cerebellar
finding. Cerebral hemispheres show bilateral foci of T2 and FLAIR
signal within the deep and subcortical white matter with a small
focus of cortical involvement at the left parietal vertex. These are
all unchanged since yesterday. The single exception to this is the
focus of restricted diffusion in the deep white matter adjacent to
the posterior body of the left lateral ventricle. This looks the
same on diffusion imaging, measuring 12 mm, but appears larger on
the T2 and FLAIR images. Because of that rapid change, ischemic
infarction is favored. Demyelinating disease in an extremely active
phase could possibly change this quickly. Low level contrast
enhancement occurs in that location, similar to yesterday's study,
possibly slightly more pronounced.

Vascular: Major vessels at the base of the brain show flow.

Skull and upper cervical spine: Negative

Sinuses/Orbits: Clear/normal

Other: None

MRA HEAD FINDINGS

Both internal carotid arteries are widely patent through the siphon
region. The anterior and middle cerebral vessels are normal without
proximal stenosis, aneurysm or vascular malformation. Both vertebral
arteries are patent. The right is a small vessel that terminates in
PICA. The left vertebral artery supplies the basilar. No basilar
stenosis. Superior cerebellar and posterior cerebral arteries appear
normal.

MRA NECK FINDINGS

Aortic arch not included in the study. Both common carotid arteries
widely patent to the bifurcation. Both carotid bifurcations appear
normal. Both cervical internal carotid arteries are normal.

Both vertebral artery origins are widely patent. Left vertebral
artery is dominant. The right vertebral artery is a small vessel
that terminates in PICA. Left vertebral artery appears normal
through the cervical region to the foramen magnum.
IMPRESSION: 1. Multiple foci of T2 and FLAIR signal within the brain as outlined
above are stable since yesterday's exam. The single exception to
this is the focus of restricted diffusion in the deep white matter
adjacent to the posterior body of the left lateral ventricle. This
looks the same on diffusion imaging, but appears slightly larger on
the T2 and FLAIR images. Low level contrast enhancement occurs in
that location, similar to yesterday's study, possibly slightly more
pronounced. Given the rapid change on FLAIR and T2 imaging, ischemic
infarction would seem more likely. However, demyelinating disease in
an extremely active phase could give this appearance rapid change as
well.
2. Intracranial MR angiography  is normal.
3. MR angiography of the neck vessels is normal.

## 2022-07-03 NOTE — Progress Notes (Signed)
Remote pacemaker transmission.   

## 2022-07-08 ENCOUNTER — Encounter: Payer: BC Managed Care – PPO | Admitting: Internal Medicine

## 2022-09-10 ENCOUNTER — Ambulatory Visit (INDEPENDENT_AMBULATORY_CARE_PROVIDER_SITE_OTHER): Payer: BC Managed Care – PPO

## 2022-09-10 DIAGNOSIS — I421 Obstructive hypertrophic cardiomyopathy: Secondary | ICD-10-CM | POA: Diagnosis not present

## 2022-09-12 LAB — CUP PACEART REMOTE DEVICE CHECK
Battery Remaining Longevity: 30 mo
Battery Voltage: 2.95 V
Brady Statistic AP VP Percent: 6.25 %
Brady Statistic AP VS Percent: 0 %
Brady Statistic AS VP Percent: 93.74 %
Brady Statistic AS VS Percent: 0.01 %
Brady Statistic RA Percent Paced: 6.25 %
Brady Statistic RV Percent Paced: 99.99 %
Date Time Interrogation Session: 20240308201304
Implantable Lead Connection Status: 753985
Implantable Lead Connection Status: 753985
Implantable Lead Implant Date: 20090608
Implantable Lead Implant Date: 20170112
Implantable Lead Location: 753859
Implantable Lead Location: 753860
Implantable Lead Model: 5076
Implantable Lead Model: 5076
Implantable Pulse Generator Implant Date: 20170112
Lead Channel Impedance Value: 323 Ohm
Lead Channel Impedance Value: 380 Ohm
Lead Channel Impedance Value: 456 Ohm
Lead Channel Impedance Value: 551 Ohm
Lead Channel Pacing Threshold Amplitude: 0.625 V
Lead Channel Pacing Threshold Amplitude: 0.875 V
Lead Channel Pacing Threshold Pulse Width: 0.4 ms
Lead Channel Pacing Threshold Pulse Width: 0.4 ms
Lead Channel Sensing Intrinsic Amplitude: 22.875 mV
Lead Channel Sensing Intrinsic Amplitude: 22.875 mV
Lead Channel Sensing Intrinsic Amplitude: 3 mV
Lead Channel Sensing Intrinsic Amplitude: 3 mV
Lead Channel Setting Pacing Amplitude: 2 V
Lead Channel Setting Pacing Amplitude: 2.5 V
Lead Channel Setting Pacing Pulse Width: 0.4 ms
Lead Channel Setting Sensing Sensitivity: 5.6 mV
Zone Setting Status: 755011
Zone Setting Status: 755011

## 2022-10-12 NOTE — Progress Notes (Signed)
Remote pacemaker transmission.   

## 2022-11-10 DIAGNOSIS — D849 Immunodeficiency, unspecified: Secondary | ICD-10-CM | POA: Insufficient documentation

## 2022-11-18 ENCOUNTER — Other Ambulatory Visit: Payer: Self-pay | Admitting: *Deleted

## 2022-11-18 ENCOUNTER — Other Ambulatory Visit (HOSPITAL_COMMUNITY): Payer: Self-pay | Admitting: Neurology

## 2022-11-18 DIAGNOSIS — G35 Multiple sclerosis: Secondary | ICD-10-CM

## 2022-12-10 ENCOUNTER — Ambulatory Visit (INDEPENDENT_AMBULATORY_CARE_PROVIDER_SITE_OTHER): Payer: BC Managed Care – PPO

## 2022-12-10 DIAGNOSIS — I421 Obstructive hypertrophic cardiomyopathy: Secondary | ICD-10-CM | POA: Diagnosis not present

## 2022-12-14 LAB — CUP PACEART REMOTE DEVICE CHECK
Battery Remaining Longevity: 27 mo
Battery Voltage: 2.94 V
Brady Statistic AP VP Percent: 8.95 %
Brady Statistic AP VS Percent: 0 %
Brady Statistic AS VP Percent: 91.05 %
Brady Statistic AS VS Percent: 0 %
Brady Statistic RA Percent Paced: 8.95 %
Brady Statistic RV Percent Paced: 99.99 %
Date Time Interrogation Session: 20240609180555
Implantable Lead Connection Status: 753985
Implantable Lead Connection Status: 753985
Implantable Lead Implant Date: 20090608
Implantable Lead Implant Date: 20170112
Implantable Lead Location: 753859
Implantable Lead Location: 753860
Implantable Lead Model: 5076
Implantable Lead Model: 5076
Implantable Pulse Generator Implant Date: 20170112
Lead Channel Impedance Value: 342 Ohm
Lead Channel Impedance Value: 380 Ohm
Lead Channel Impedance Value: 418 Ohm
Lead Channel Impedance Value: 513 Ohm
Lead Channel Pacing Threshold Amplitude: 0.625 V
Lead Channel Pacing Threshold Amplitude: 1.125 V
Lead Channel Pacing Threshold Pulse Width: 0.4 ms
Lead Channel Pacing Threshold Pulse Width: 0.4 ms
Lead Channel Sensing Intrinsic Amplitude: 14.625 mV
Lead Channel Sensing Intrinsic Amplitude: 14.625 mV
Lead Channel Sensing Intrinsic Amplitude: 2.625 mV
Lead Channel Sensing Intrinsic Amplitude: 2.625 mV
Lead Channel Setting Pacing Amplitude: 2 V
Lead Channel Setting Pacing Amplitude: 2.5 V
Lead Channel Setting Pacing Pulse Width: 0.4 ms
Lead Channel Setting Sensing Sensitivity: 5.6 mV
Zone Setting Status: 755011
Zone Setting Status: 755011

## 2022-12-31 NOTE — Progress Notes (Signed)
Remote pacemaker transmission.   

## 2023-01-28 DIAGNOSIS — R2689 Other abnormalities of gait and mobility: Secondary | ICD-10-CM | POA: Insufficient documentation

## 2023-01-28 DIAGNOSIS — R269 Unspecified abnormalities of gait and mobility: Secondary | ICD-10-CM | POA: Insufficient documentation

## 2023-03-15 ENCOUNTER — Ambulatory Visit (INDEPENDENT_AMBULATORY_CARE_PROVIDER_SITE_OTHER): Payer: PRIVATE HEALTH INSURANCE

## 2023-03-15 DIAGNOSIS — I421 Obstructive hypertrophic cardiomyopathy: Secondary | ICD-10-CM

## 2023-03-15 DIAGNOSIS — I442 Atrioventricular block, complete: Secondary | ICD-10-CM | POA: Diagnosis not present

## 2023-03-18 LAB — CUP PACEART REMOTE DEVICE CHECK
Battery Remaining Longevity: 25 mo
Battery Voltage: 2.93 V
Brady Statistic AP VP Percent: 11.72 %
Brady Statistic AP VS Percent: 0 %
Brady Statistic AS VP Percent: 87.69 %
Brady Statistic AS VS Percent: 0.59 %
Brady Statistic RA Percent Paced: 11.57 %
Brady Statistic RV Percent Paced: 98.11 %
Date Time Interrogation Session: 20240912134859
Implantable Lead Connection Status: 753985
Implantable Lead Connection Status: 753985
Implantable Lead Implant Date: 20090608
Implantable Lead Implant Date: 20170112
Implantable Lead Location: 753859
Implantable Lead Location: 753860
Implantable Lead Model: 5076
Implantable Lead Model: 5076
Implantable Pulse Generator Implant Date: 20170112
Lead Channel Impedance Value: 342 Ohm
Lead Channel Impedance Value: 399 Ohm
Lead Channel Impedance Value: 437 Ohm
Lead Channel Impedance Value: 532 Ohm
Lead Channel Pacing Threshold Amplitude: 0.625 V
Lead Channel Pacing Threshold Amplitude: 1 V
Lead Channel Pacing Threshold Pulse Width: 0.4 ms
Lead Channel Pacing Threshold Pulse Width: 0.4 ms
Lead Channel Sensing Intrinsic Amplitude: 3.125 mV
Lead Channel Sensing Intrinsic Amplitude: 3.125 mV
Lead Channel Sensing Intrinsic Amplitude: 9.875 mV
Lead Channel Sensing Intrinsic Amplitude: 9.875 mV
Lead Channel Setting Pacing Amplitude: 2 V
Lead Channel Setting Pacing Amplitude: 2.5 V
Lead Channel Setting Pacing Pulse Width: 0.4 ms
Lead Channel Setting Sensing Sensitivity: 5.6 mV
Zone Setting Status: 755011
Zone Setting Status: 755011

## 2023-04-01 NOTE — Progress Notes (Signed)
Remote pacemaker transmission.   

## 2023-06-14 ENCOUNTER — Ambulatory Visit (INDEPENDENT_AMBULATORY_CARE_PROVIDER_SITE_OTHER): Payer: PRIVATE HEALTH INSURANCE

## 2023-06-14 DIAGNOSIS — I442 Atrioventricular block, complete: Secondary | ICD-10-CM | POA: Diagnosis not present

## 2023-06-16 LAB — CUP PACEART REMOTE DEVICE CHECK
Battery Remaining Longevity: 23 mo
Battery Voltage: 2.92 V
Brady Statistic AP VP Percent: 11.67 %
Brady Statistic AP VS Percent: 0 %
Brady Statistic AS VP Percent: 87.82 %
Brady Statistic AS VS Percent: 0.5 %
Brady Statistic RA Percent Paced: 11.52 %
Brady Statistic RV Percent Paced: 98.05 %
Date Time Interrogation Session: 20241211001308
Implantable Lead Connection Status: 753985
Implantable Lead Connection Status: 753985
Implantable Lead Implant Date: 20090608
Implantable Lead Implant Date: 20170112
Implantable Lead Location: 753859
Implantable Lead Location: 753860
Implantable Lead Model: 5076
Implantable Lead Model: 5076
Implantable Pulse Generator Implant Date: 20170112
Lead Channel Impedance Value: 342 Ohm
Lead Channel Impedance Value: 380 Ohm
Lead Channel Impedance Value: 437 Ohm
Lead Channel Impedance Value: 513 Ohm
Lead Channel Pacing Threshold Amplitude: 0.625 V
Lead Channel Pacing Threshold Amplitude: 1 V
Lead Channel Pacing Threshold Pulse Width: 0.4 ms
Lead Channel Pacing Threshold Pulse Width: 0.4 ms
Lead Channel Sensing Intrinsic Amplitude: 3 mV
Lead Channel Sensing Intrinsic Amplitude: 3 mV
Lead Channel Sensing Intrinsic Amplitude: 9.375 mV
Lead Channel Sensing Intrinsic Amplitude: 9.375 mV
Lead Channel Setting Pacing Amplitude: 2 V
Lead Channel Setting Pacing Amplitude: 2.5 V
Lead Channel Setting Pacing Pulse Width: 0.4 ms
Lead Channel Setting Sensing Sensitivity: 5.6 mV
Zone Setting Status: 755011
Zone Setting Status: 755011

## 2023-07-16 ENCOUNTER — Ambulatory Visit: Payer: PRIVATE HEALTH INSURANCE | Attending: Internal Medicine | Admitting: Internal Medicine

## 2023-07-16 ENCOUNTER — Encounter: Payer: Self-pay | Admitting: Internal Medicine

## 2023-07-16 VITALS — BP 102/58 | HR 57 | Ht 70.0 in | Wt 133.4 lb

## 2023-07-16 DIAGNOSIS — I421 Obstructive hypertrophic cardiomyopathy: Secondary | ICD-10-CM | POA: Diagnosis not present

## 2023-07-16 DIAGNOSIS — I442 Atrioventricular block, complete: Secondary | ICD-10-CM | POA: Diagnosis not present

## 2023-07-16 DIAGNOSIS — Z950 Presence of cardiac pacemaker: Secondary | ICD-10-CM

## 2023-07-16 NOTE — Progress Notes (Signed)
 Patient ID: Rodney Adkins, male   DOB: Jul 03, 1956, 68 y.o.   MRN: [id number]        Patient Care Team: Lisa Lynwood Redbird, MD as PCP - General (Internal Medicine) Avram Lupita BRAVO, MD as Consulting Physician (Gastroenterology) Fernande Elspeth BROCKS, MD as Consulting Physician (Cardiology)   HPI  Rodney Adkins is a 68 y.o. male Seen in followup for HTN with complete heart block status post pacing in the setting of hypertrophic obstructive cardiomyopathy.  He has been seen again recently because of exercise intolerance previously had been associated with upper rate Wenckebach behavior. Because of episodes of chest pain he underwent catheterization demonstrating no obstructive coronary disease but there were gradients.LVEDP>>5  He had a broken atrial lead. He saw both Dr. CLEMETINE and [facility] and elected to have his extraction done here. It was done by Dr. Waddell 1/17   4/17 Echo LVEF 60-65% with gradeint resting 29  Not  Exercised.  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   Interval stroke11/21 presenting with slurred speech.  MRI imaging demonstrated acute 12 mm diffusion restricted lesion of the left corona radiata radiological impression was demyelination over stroke.  Reviewing the discharge summary ultimately the feeling it was related to stroke and not an MS flare.    He underwent extensive therapy including speech without significant improvement He have some concerns of dysphasia  He is concerned with his speech articulation and pronunciation    there is  associated depression for which  he was started on Wellbutrin  this was stopped also without improvement  Denies chest pain.  Moderate shortness of breath with activity.  No palpitations.  Voice continues to weaken.  Balance is more of an issue.  Question related to MS or prior stroke.   Has retired from work     DATE TEST EF   4/17 Echo   60-65 %    4/18 Echo    Grad >>50 mm  11/21 Echo 60-65%    Date Cr K  Hgb   1/20 0. 2 yearly    15.0 (7/18)   11/21   3.7   14.9 (  )     11/ 21 (CE) 0.81 4.4 15.2    5/23 0.8 4.9 14.7   7/24 0.85 4.4 14.9     Past Medical History:  Diagnosis Date   Complete heart block [facility]) June 2009   a. Presented with hypotension/pulm edema 12/2007 when HOCM was also discovered. s/p dual chamber MDT pacemaker. Thallium stest 7/09 negative for infiltrative disease or ischemia.  Followed Dr Fernande.   Heart murmur    HOCM (hypertrophic obstructive cardiomyopathy) ([facility]) 2009   Discovered by ECHO when he developed CHB. Mgmt Dr Fernande  and Dr Cesario at [facility]. Has outflow obstruction on ECHO 2011   Hx of adenomatous colonic polyps 11/27/2021   Multiple sclerosis ([facility]) 1998   Sxs started 1998 with L body numbness. MRI c/w MS. Started Avonex. Saw Dr Juliane at [facility] and switched to Betaseron and was on for 5 yrs but developed depression and site rxns and D/C'd 2006. While on Betaseron, occassionally required solumedrol for flares.   Pacemaker 2017   Prostate enlargement    Right bundle branch block    SOB (shortness of breath)    a. PFTs 09/2010 essentially normal per pulm note (mild obst defect). b. 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   Wenckebach  a. Decreased ex tolerance 2010/2012 - underwent stress echo to look @ effects of MV/LVOT - nothing noted; although pt had high rate Wenckebach behavior with associated ex intol. Pacer reprogrammed with improvement in sx.    Past Surgical History:  Procedure Laterality Date   LEFT HEART CATHETERIZATION WITH CORONARY ANGIOGRAM N/A 08/22/2013   Procedure: LEFT HEART CATHETERIZATION WITH CORONARY ANGIOGRAM;  Surgeon: Victory LELON Claudene Theseus, MD;  Location: [facility] CATH LAB;  Service: Cardiovascular;  Laterality: N/A;   LEG TENDON SURGERY  2008   Os peroneus resection and peroneal tendon repair   pace maker     PACEMAKER INSERTION  12/2007   Complete heart block   PACEMAKER  LEAD REMOVAL Left 07/18/2015   Procedure: ATRIAL PACEMAKER LEAD EXTRACTION; ATRIAL PACEMAKER LEAD INSERTION; GENERATOR REPLACEMENT.;  Surgeon: Danelle LELON Birmingham, MD;  Location: [facility] OR;  Service: Cardiovascular;  Laterality: Left;  [name] to back up   PERIPHERAL VASCULAR CATHETERIZATION N/A 02/12/2015   Procedure: Venogram;  Surgeon: Elspeth JAYSON Sage, MD;  Location: [facility] INVASIVE CV LAB;  Service: Cardiovascular;  Laterality: N/A;   TEE WITHOUT CARDIOVERSION N/A 07/18/2015   Procedure: TRANSESOPHAGEAL ECHOCARDIOGRAM (TEE);  Surgeon: Danelle LELON Birmingham, MD;  Location: [facility] OR;  Service: Cardiovascular;  Laterality: N/A;    Current Outpatient Medications  Medication Sig Dispense Refill   aspirin  81 MG EC tablet Take 1 tablet by mouth daily.     BAFIERTAM 95 MG CPDR Take 95 mg by mouth in the morning and at bedtime.      Multiple Vitamin (MULTIVITAMIN) tablet Take 1 tablet by mouth daily.     rosuvastatin (CRESTOR) 20 MG tablet Take 20 mg by mouth at bedtime.     No current facility-administered medications for this visit.    No Known Allergies  Review of Systems negative except from HPI and PMH  Physical Exam: BP (!) 102/58   Pulse (!) 57   Ht 5' 10 (1.778 m)   Wt 133 lb 6.4 oz (60.5 kg)   SpO2 99%   BMI 19.14 kg/m  Well developed and well nourished in no acute distress HENT normal Neck supple with JVP-flat Clear Device pocket well healed; without hematoma or erythema.  There is no tethering  Regular rate and rhythm, no  gallop No murmur Abd-soft with active BS No Clubbing cyanosis  edema Skin-warm and dry A & Oriented  Grossly normal sensory and motor function  ECG sinus with P synchronous pacing  Device function is normal. Programming changes none  See Paceart for details    Assessment and  Plan  HCM  Complete Heart Block  Pacer Medtronic     Dyspnea on exertion  Multiple sclerosis   No atrial fibrillation.  Will continue him on aspirin  for stroke prophylaxis as well as  rosuvastatin.  Device function is normal.  Encouraged him to think about perhaps a another opinion about his MS.

## 2023-07-16 NOTE — Patient Instructions (Signed)

## 2023-07-22 NOTE — Progress Notes (Signed)
Remote pacemaker transmission.   

## 2023-07-22 NOTE — Addendum Note (Signed)
Addended by: Geralyn Flash D on: 07/22/2023 10:27 AM   Modules accepted: Orders

## 2023-08-16 ENCOUNTER — Other Ambulatory Visit (HOSPITAL_COMMUNITY): Payer: Self-pay | Admitting: Neurology

## 2023-08-16 DIAGNOSIS — G35 Multiple sclerosis: Secondary | ICD-10-CM

## 2023-09-13 ENCOUNTER — Ambulatory Visit (INDEPENDENT_AMBULATORY_CARE_PROVIDER_SITE_OTHER): Payer: PRIVATE HEALTH INSURANCE

## 2023-09-13 DIAGNOSIS — I442 Atrioventricular block, complete: Secondary | ICD-10-CM

## 2023-09-15 LAB — CUP PACEART REMOTE DEVICE CHECK
Battery Remaining Longevity: 18 mo
Battery Voltage: 2.91 V
Brady Statistic AP VP Percent: 13.16 %
Brady Statistic AP VS Percent: 0 %
Brady Statistic AS VP Percent: 86.02 %
Brady Statistic AS VS Percent: 0.81 %
Brady Statistic RA Percent Paced: 12.97 %
Brady Statistic RV Percent Paced: 97.68 %
Date Time Interrogation Session: 20250312112046
Implantable Lead Connection Status: 753985
Implantable Lead Connection Status: 753985
Implantable Lead Implant Date: 20090608
Implantable Lead Implant Date: 20170112
Implantable Lead Location: 753859
Implantable Lead Location: 753860
Implantable Lead Model: 5076
Implantable Lead Model: 5076
Implantable Pulse Generator Implant Date: 20170112
Lead Channel Impedance Value: 342 Ohm
Lead Channel Impedance Value: 380 Ohm
Lead Channel Impedance Value: 418 Ohm
Lead Channel Impedance Value: 513 Ohm
Lead Channel Pacing Threshold Amplitude: 0.625 V
Lead Channel Pacing Threshold Amplitude: 1.125 V
Lead Channel Pacing Threshold Pulse Width: 0.4 ms
Lead Channel Pacing Threshold Pulse Width: 0.4 ms
Lead Channel Sensing Intrinsic Amplitude: 11.25 mV
Lead Channel Sensing Intrinsic Amplitude: 11.25 mV
Lead Channel Sensing Intrinsic Amplitude: 2.75 mV
Lead Channel Sensing Intrinsic Amplitude: 2.75 mV
Lead Channel Setting Pacing Amplitude: 2 V
Lead Channel Setting Pacing Amplitude: 2.5 V
Lead Channel Setting Pacing Pulse Width: 0.4 ms
Lead Channel Setting Sensing Sensitivity: 5.6 mV
Zone Setting Status: 755011
Zone Setting Status: 755011

## 2023-09-27 ENCOUNTER — Encounter (HOSPITAL_COMMUNITY): Payer: Self-pay

## 2023-09-27 ENCOUNTER — Ambulatory Visit (HOSPITAL_COMMUNITY): Payer: PRIVATE HEALTH INSURANCE

## 2023-10-18 ENCOUNTER — Encounter: Payer: Self-pay | Admitting: Internal Medicine

## 2023-10-18 NOTE — Addendum Note (Signed)
 Addended by: Edra Govern D on: 10/18/2023 12:59 PM   Modules accepted: Orders

## 2023-10-18 NOTE — Progress Notes (Signed)
 Remote pacemaker transmission.

## 2023-10-27 NOTE — CV Procedure (Signed)
  Device system confirmed to be MRI conditional, with implant date > 6 weeks ago, and no evidence of abandoned or epicardial leads in review of most recent CXR  Device last cleared by EP Provider: Valeri Gate 10/27/23  Clearance is good through for 1 year as long as parameters remain stable at time of check. If pt undergoes a cardiac device procedure during that time, they should be re-cleared.   Tachy-therapies to be programmed off if applicable with device back to pre-MRI settings after completion of exam.  Medtronic - Programming recommendation received through Medtronic App/Tablet  Arlys Lamer, RT  10/27/2023 2:08 PM

## 2023-11-02 ENCOUNTER — Ambulatory Visit (HOSPITAL_COMMUNITY)
Admission: RE | Admit: 2023-11-02 | Discharge: 2023-11-02 | Disposition: A | Discharge status: Home / Self Care | Payer: PRIVATE HEALTH INSURANCE | Source: Ambulatory Visit | Attending: Neurology | Admitting: Neurology

## 2023-11-16 ENCOUNTER — Ambulatory Visit (HOSPITAL_COMMUNITY)
Admission: RE | Admit: 2023-11-16 | Discharge: 2023-11-16 | Disposition: A | Discharge status: Home / Self Care | Payer: PRIVATE HEALTH INSURANCE | Source: Ambulatory Visit | Attending: Neurology | Admitting: Neurology

## 2023-11-16 ENCOUNTER — Encounter (HOSPITAL_COMMUNITY): Payer: Self-pay

## 2023-11-16 DIAGNOSIS — G35 Multiple sclerosis: Secondary | ICD-10-CM

## 2023-11-16 NOTE — Progress Notes (Signed)
 Patient here accompanied by his wife for out patient MRI When placed into MRI mode he developed palpitations that could be visually appreciated He was taken out of mode and I was called Formal device check noted stable battery and lead measurements AS/VP Device dependent with known CHB Given normal lead measurements and battery status Decided to try MRI mode again via the MRI app HR went 85 by telemetry monitor (baseline was 71) but developed pocket /chest wall stimulation with the higher output. Quickly returned to normal programming with resolution  His wife mentioned he has lost some weight, perhaps why he is getting chest wall/stim this MRI mode and not with priors Device is functioning normal with stable measurements  Discussed with EP MD on the phone With stable device measurements agreed OK to leave and consider rescheduling with industry support. D/w industry Weight loss would explain findings  Mertha Abrahams, PA-C

## 2023-11-22 ENCOUNTER — Encounter: Payer: Self-pay | Admitting: Internal Medicine

## 2023-12-07 NOTE — Progress Notes (Unsigned)
 [facility]  PATIENT: Rodney Adkins DOB: 1956-03-16  REFERRING DOCTOR OR PCP: Gillie Lacy, MD SOURCE: Patient, notes from primary care, notes from [facility], imaging and lab reports, multiple MRI images personally reviewed and summarized below.  _________________________________   HISTORICAL  CHIEF COMPLAINT:  Chief Complaint  Patient presents with   RM11/MS    Pt is here with his Wife. Pt states that he has been pretty stable. Pt has a pacemaker and can't get MRI.      HISTORY OF PRESENT ILLNESS:  I had the pleasure of seeing your patient, Rodney Adkins, at the [facility] at [facility] for neurologic consultation regarding his multiple sclerosis.  He is a 68 year old man who was diagnosed with MS in 1998 (Dr. Susana Enter) after he developed numbness in the hands and face.  MRI of the brain showed abnormalities consistent with MS.  He was initially placed on Avonex but discontinued due to depression.  He had depression and skin reactions on Betaseron when tried a couple years later.  He stopped Betaseron 2006 and for a while was on intermittent IV Solu-Medrol.  He was briefly on Copaxone.   In 2014 he had an MS exacerbation with left-sided numbness.  In 2017 he had increased imbalance and and was started on Tecfidera.  He stopped Tecfidera in 2020 due to insurance issues..  At some point he was placed on Tecfidera.  He had a stroke November 2021 with right-sided symptoms and reduced speech.  More recently he was seen Dr. Wendy Hamel in [place] and was placed on Bafiertam and had a single visit with Dr. Abou Zeid earlier 2025.    His last exacerbation was 2011 with numbess/tingling on on side of his body and he received IV Solumedrol.        He was switched from Tecfidera to Aldrich 4 years ago due to insurance.     He had a stroke in 2021 causing right sided weakness and worsening of gait.  He did not need a cane before the stroke but has eeded  waking sticks since.   The stroke also affected speech and vision.  He was placed on a statin and ASA.  Currently, he has difficulty with gait, mostly due to right sided weakness and spasticity in the leg.   Additionally balance is poor and he uses walking sticks.   His right hand is often red since the stroke.  He has some left hand numbness,  No painful tingling.    He has had bladder issues since robotic prostatectomy and still has urinary hesitancy.  Flomax had not helped in past.      Vision worsened after his stroke and unable to read small print.  Speech is off with more difficulty pronouncing words since the stroke.   HE denies aphasia.   He denies other cognitive issues.   He has mild depression but refers not to take prescribed antidepressant.   Wellbutrin helped mood years ago.    He falls asleep well but has nocturia x 3 and sometimes has trouble falling back asleep.    He has fatigue.   He sleeps many hours and takes naps as well.   He snores loudly and has had HST showing only AHI=2.    He has a pacemaker for complete heart block.   He was going to have an MRI a couple weeks ago but they were unable to definitely place the pacemaker in safe mode for MRI and it was  cancelled.    Imaging: MRI of the brain 02/12/2021 showed T2/FLAIR hyperintense foci in the periventricular, juxtacortical deep white matter of the cerebral hemispheres.  There is wallerian degeneration noted along the left corticospinal tract entering the left cerebral peduncle.  A couple small foci noted in the pons.  None of the foci appear to be acute.  No changes compared to the MRI from 05/15/2020.  MRI of the brain 05/16/2020 showed a left posterior frontal infarction in the corona radiata.  MR angiogram 05/16/2020 was normal  MRI cervical spine 05/16/2020 showed a focus at C4 and possibly another focus adjacent to C2    REVIEW OF SYSTEMS: Constitutional: No fevers, chills, sweats, or change in appetite Eyes: No  visual changes, double vision, eye pain Ear, nose and throat: No hearing loss, ear pain, nasal congestion, sore throat Cardiovascular: No chest pain, palpitations Respiratory:  No shortness of breath at rest or with exertion.   No wheezes GastrointestinaI: No nausea, vomiting, diarrhea, abdominal pain, fecal incontinence Genitourinary:  No dysuria, urinary retention or frequency.  No nocturia. Musculoskeletal:  No neck pain, back pain Integumentary: No rash, pruritus, skin lesions Neurological: as above Psychiatric: No depression at this time.  No anxiety Endocrine: No palpitations, diaphoresis, change in appetite, change in weigh or increased thirst Hematologic/Lymphatic:  No anemia, purpura, petechiae. Allergic/Immunologic: No itchy/runny eyes, nasal congestion, recent allergic reactions, rashes  ALLERGIES: No Known Allergies  HOME MEDICATIONS:  Current Outpatient Medications:    aspirin  81 MG EC tablet, Take 1 tablet by mouth daily., Disp: , Rfl:    BAFIERTAM 95 MG CPDR, Take 95 mg by mouth in the morning and at bedtime. , Disp: , Rfl:    buPROPion (WELLBUTRIN XL) 150 MG 24 hr tablet, Take 1 tablet (150 mg total) by mouth daily., Disp: 90 tablet, Rfl: 3   Multiple Vitamin (MULTIVITAMIN) tablet, Take 1 tablet by mouth daily., Disp: , Rfl:    rosuvastatin (CRESTOR) 20 MG tablet, Take 20 mg by mouth at bedtime., Disp: , Rfl:   PAST MEDICAL HISTORY: Past Medical History:  Diagnosis Date   Complete heart block [facility]) June 2009   a. Presented with hypotension/pulm edema 12/2007 when HOCM was also discovered. s/p dual chamber MDT pacemaker. Thallium stest 7/09 negative for infiltrative disease or ischemia.  Followed Dr Rodolfo Clan.   Heart murmur    HOCM (hypertrophic obstructive cardiomyopathy) (HCC) 2009   Discovered by ECHO when he developed CHB. Mgmt Dr Rodolfo Clan  and Dr Donna Fus at [facility]. Has outflow obstruction on ECHO 2011   Hx of adenomatous colonic polyps 11/27/2021   Multiple sclerosis (HCC)  1998   Sxs started 1998 with L body numbness. MRI c/w MS. Started Avonex. Saw Dr Sulema Endo at [facility] and switched to Betaseron and was on for 5 yrs but developed depression and site rxns and D/C'd 2006. While on Betaseron, occassionally required solumedrol for flares.   Pacemaker 2017   Prostate enlargement    Right bundle branch block    SOB (shortness of breath)    a. PFTs 09/2010 essentially normal per pulm note (mild obst defect). b. 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 Pacer reprogrammed with improvement in sx.    PAST SURGICAL HISTORY: Past Surgical History:  Procedure Laterality Date   LEFT HEART CATHETERIZATION WITH CORONARY  ANGIOGRAM N/A 08/22/2013   Procedure: LEFT HEART CATHETERIZATION WITH CORONARY ANGIOGRAM;  Surgeon: Mickiel Albany, MD;  Location: [facility] CATH LAB;  Service: Cardiovascular;  Laterality: N/A;   LEG TENDON SURGERY  2008   Os peroneus resection and peroneal tendon repair   pace maker     PACEMAKER INSERTION  12/2007   Complete heart block   PACEMAKER LEAD REMOVAL Left 07/18/2015   Procedure: ATRIAL PACEMAKER LEAD EXTRACTION; ATRIAL PACEMAKER LEAD INSERTION; GENERATOR REPLACEMENT.;  Surgeon: Tammie Fall, MD;  Location: [facility] OR;  Service: Cardiovascular;  Laterality: Left;  [name] to back up   PERIPHERAL VASCULAR CATHETERIZATION N/A 02/12/2015   Procedure: Venogram;  Surgeon: Verona Goodwill, MD;  Location: [facility] INVASIVE CV LAB;  Service: Cardiovascular;  Laterality: N/A;   TEE WITHOUT CARDIOVERSION N/A 07/18/2015   Procedure: TRANSESOPHAGEAL ECHOCARDIOGRAM (TEE);  Surgeon: Tammie Fall, MD;  Location: [facility] OR;  Service: Cardiovascular;  Laterality: N/A;    FAMILY HISTORY: Family History  Problem Relation Age of Onset   Colon polyps Father    Hypertension Father     Diabetes Paternal Grandmother    Heart disease Neg Hx    Colon cancer Neg Hx    Stomach cancer Neg Hx    Esophageal cancer Neg Hx    Rectal cancer Neg Hx    Multiple sclerosis Neg Hx     SOCIAL HISTORY: Social History   Socioeconomic History   Marital status: Married    Spouse name: Not on file   Number of children: 0   Years of education: Not on file   Highest education level: Not on file  Occupational History   Occupation: professor    Employer: [employer]    Comment: Statistics  Tobacco Use   Smoking status: Never   Smokeless tobacco: Never  Vaping Use   Vaping status: Never Used  Substance and Sexual Activity   Alcohol use: No    Alcohol/week: 0.0 standard drinks of alcohol   Drug use: No   Sexual activity: Not Currently  Other Topics Concern   Not on file  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 Vegetarian.   Social Drivers of Corporate investment banker Strain: Not on file  Food Insecurity: Not on file  Transportation Needs: Not on file  Physical Activity: Not on file  Stress: Not on file  Social Connections: Not on file  Intimate Partner Violence: Not on file       PHYSICAL EXAM  Vitals:   12/09/23 0906  BP: 111/66  Pulse: 73  Weight: 132 lb (59.9 kg)  Height: 5\' 10"  (1.778 m)    Body mass index is 18.94 kg/m.   General: The patient is well-developed and well-nourished and in no acute distress  HEENT:  Head is Selz/AT.  Sclera are anicteric.    Neck: No carotid bruits are noted.  The neck is nontender.  Cardiovascular: The heart has a regular rate and rhythm with a normal S1 and S2. There were no murmurs, gallops or rubs.    Skin: Extremities are without rash or  edema.  Musculoskeletal:  Back is nontender  Neurologic Exam  Mental status: The patient is alert and oriented x 3 at the time of the examination. The patient has apparent normal recent and remote memory, with an apparently normal attention span and  concentration ability.   Speech is normal.  Cranial nerves: Extraocular movements are full. Pupils are equal, round, and reactive to light and  accomodation.  Visual fields are full.  Facial symmetry is present. There is good facial sensation to soft touch bilaterally.Facial strength is normal.  Trapezius and sternocleidomastoid strength is normal. His voice is hoarse.  The tongue is midline, and the patient has symmetric elevation of the soft palate. No obvious hearing deficits are noted.   Motor:  Muscle bulk is normal.   Tone is slightly increased I right leg. Strength is  4+/5 in right triceps and intrinsic hand muscles in right arm and 4+/5 right iliopsoas and toe extension in right leg.   5 / 5 in left extremities.   Sensory: Sensory testing is intact to pinprick, soft touch and vibration sensation in all 4 extremities.  Coordination: Cerebellar testing reveals good finger-nose-finger and heel-to-shin bilaterally.  Gait and station: Station is normal.   Gait is slightly right spastic.  Tandem is reduced . Rodney Adkins Romberg is negative.   Reflexes: Deep tendon reflexes are symmetric and normal in arms and increased in legs with spread at knees and 2 beats nonsustained clonus in right leg..   Plantar responses are flexor.    DIAGNOSTIC DATA (LABS, IMAGING, TESTING) - I reviewed patient records, labs, notes, testing and imaging myself where available.  Lab Results  Component Value Date   WBC 8.8 05/16/2020   HGB 14.9 05/16/2020   HCT 43.3 05/16/2020   MCV 88.5 05/16/2020   PLT 204 05/16/2020      Component Value Date/Time   NA 141 05/16/2020 0308   K 3.7 05/16/2020 0308   CL 106 05/16/2020 0308   CO2 24 05/16/2020 0308   GLUCOSE 90 05/16/2020 0308   BUN 14 05/16/2020 0308   CREATININE 0.96 05/16/2020 0308   CALCIUM  9.5 05/16/2020 0308   PROT 6.8 05/15/2020 1047   ALBUMIN 4.1 05/15/2020 1047   AST 24 05/15/2020 1047   ALT 21 05/15/2020 1047   ALKPHOS 76 05/15/2020 1047   BILITOT  0.9 05/15/2020 1047   GFRNONAA >60 05/16/2020 0308   GFRAA  12/14/2007 1100    >60        The eGFR has been calculated using the MDRD equation. This calculation has not been validated in all clinical   Lab Results  Component Value Date   CHOL 170 05/16/2020   HDL 49 05/16/2020   LDLCALC 103 (H) 05/16/2020   TRIG 92 05/16/2020   CHOLHDL 3.5 05/16/2020   Lab Results  Component Value Date   HGBA1C 5.7 (H) 05/16/2020   No results found for: "VITAMINB12" Lab Results  Component Value Date   TSH 1.81 08/07/2013       ASSESSMENT AND PLAN  Multiple sclerosis (HCC)  Cerebrovascular accident (CVA), unspecified mechanism (HCC)  Third degree heart block (HCC)  Gait disturbance  Depression, unspecified depression type   In summary, Rodney Adkins is a 68 year old man who was diagnosed with MS in 1998.  He is currently on Bafiertam and has been stable over the past few years.  We discussed the natural history of MS.  He seems to be stable on Bafiertam and tolerating it well and we will continue this for now.  Some patients are able to discontinue therapy with age and I would consider doing this if he continues to be stable for couple more years.  He has a pacemaker.  He was unable to be placed into safe mode when an MRI was recently attempted.  I asked him to discuss further with cardiology to see if something else can be done.  He would be unable to have an MRI without this mode.  Besides issues with his left-sided weakness and gait, he has some depression.  He had been on Wellbutrin in the past with benefit and I will prescribe this again.  He will return to see me in 6 months or sooner if there are new or worsening neurologic symptoms.  Thank you for asking me to see Rodney Adkins.  Please let me know if I can be of further assistance with him or other patients in the future.   This visit is part of a comprehensive longitudinal care medical relationship regarding the patients primary  diagnosis of multiple sclerosis and related concerns.    Ladarrell Cornwall A. Godwin Lat, MD, [facility] 12/09/2023, 5:38 PM Certified in Neurology, Clinical Neurophysiology, Sleep Medicine and Neuroimaging  [facility] [address], [address] [place], [place] [address] [phone]

## 2023-12-09 ENCOUNTER — Ambulatory Visit: Payer: PRIVATE HEALTH INSURANCE | Admitting: Neurology

## 2023-12-09 ENCOUNTER — Encounter: Payer: Self-pay | Admitting: Neurology

## 2023-12-09 VITALS — BP 111/66 | HR 73 | Ht 70.0 in | Wt 132.0 lb

## 2023-12-09 DIAGNOSIS — I442 Atrioventricular block, complete: Secondary | ICD-10-CM | POA: Diagnosis not present

## 2023-12-09 DIAGNOSIS — G35 Multiple sclerosis: Secondary | ICD-10-CM

## 2023-12-09 DIAGNOSIS — R269 Unspecified abnormalities of gait and mobility: Secondary | ICD-10-CM | POA: Diagnosis not present

## 2023-12-09 DIAGNOSIS — I639 Cerebral infarction, unspecified: Secondary | ICD-10-CM

## 2023-12-09 DIAGNOSIS — F32A Depression, unspecified: Secondary | ICD-10-CM

## 2023-12-09 MED ORDER — BUPROPION HCL ER (XL) 150 MG PO TB24: 150.0000 mg | ORAL_TABLET | Freq: Every day | ORAL | 3 refills | Status: AC

## 2023-12-12 ENCOUNTER — Encounter: Payer: Self-pay | Admitting: Neurology

## 2023-12-13 ENCOUNTER — Ambulatory Visit (INDEPENDENT_AMBULATORY_CARE_PROVIDER_SITE_OTHER): Payer: PRIVATE HEALTH INSURANCE

## 2023-12-13 ENCOUNTER — Encounter: Payer: Self-pay | Admitting: *Deleted

## 2023-12-13 DIAGNOSIS — I442 Atrioventricular block, complete: Secondary | ICD-10-CM | POA: Diagnosis not present

## 2023-12-13 NOTE — Telephone Encounter (Signed)
 Are okay with doing this?

## 2023-12-15 LAB — CUP PACEART REMOTE DEVICE CHECK
Battery Remaining Longevity: 16 mo
Battery Voltage: 2.9 V
Brady Statistic AP VP Percent: 7.48 %
Brady Statistic AP VS Percent: 0 %
Brady Statistic AS VP Percent: 92.41 %
Brady Statistic AS VS Percent: 0.11 %
Brady Statistic RA Percent Paced: 7.45 %
Brady Statistic RV Percent Paced: 99.55 %
Date Time Interrogation Session: 20250610154758
Implantable Lead Connection Status: 753985
Implantable Lead Connection Status: 753985
Implantable Lead Implant Date: 20090608
Implantable Lead Implant Date: 20170112
Implantable Lead Location: 753859
Implantable Lead Location: 753860
Implantable Lead Model: 5076
Implantable Lead Model: 5076
Implantable Pulse Generator Implant Date: 20170112
Lead Channel Impedance Value: 323 Ohm
Lead Channel Impedance Value: 380 Ohm
Lead Channel Impedance Value: 437 Ohm
Lead Channel Impedance Value: 513 Ohm
Lead Channel Pacing Threshold Amplitude: 0.625 V
Lead Channel Pacing Threshold Amplitude: 1 V
Lead Channel Pacing Threshold Pulse Width: 0.4 ms
Lead Channel Pacing Threshold Pulse Width: 0.4 ms
Lead Channel Sensing Intrinsic Amplitude: 2.5 mV
Lead Channel Sensing Intrinsic Amplitude: 2.5 mV
Lead Channel Sensing Intrinsic Amplitude: 8.875 mV
Lead Channel Sensing Intrinsic Amplitude: 8.875 mV
Lead Channel Setting Pacing Amplitude: 2 V
Lead Channel Setting Pacing Amplitude: 2.5 V
Lead Channel Setting Pacing Pulse Width: 0.4 ms
Lead Channel Setting Sensing Sensitivity: 5.6 mV
Zone Setting Status: 755011
Zone Setting Status: 755011

## 2023-12-31 ENCOUNTER — Ambulatory Visit: Payer: Self-pay | Admitting: Cardiology

## 2024-01-26 NOTE — Addendum Note (Signed)
 Addended by: TAWNI DRILLING D on: 01/26/2024 11:58 AM   Modules accepted: Orders

## 2024-01-26 NOTE — Progress Notes (Signed)
 Remote pacemaker transmission.

## 2024-03-13 ENCOUNTER — Ambulatory Visit (INDEPENDENT_AMBULATORY_CARE_PROVIDER_SITE_OTHER): Payer: PRIVATE HEALTH INSURANCE

## 2024-03-13 DIAGNOSIS — I442 Atrioventricular block, complete: Secondary | ICD-10-CM

## 2024-03-15 LAB — CUP PACEART REMOTE DEVICE CHECK
Battery Remaining Longevity: 12 mo
Battery Voltage: 2.89 V
Brady Statistic AP VP Percent: 1.46 %
Brady Statistic AP VS Percent: 0 %
Brady Statistic AS VP Percent: 98.47 %
Brady Statistic AS VS Percent: 0.08 %
Brady Statistic RA Percent Paced: 1.45 %
Brady Statistic RV Percent Paced: 99.68 %
Date Time Interrogation Session: 20250909152848
Implantable Lead Connection Status: 753985
Implantable Lead Connection Status: 753985
Implantable Lead Implant Date: 20090608
Implantable Lead Implant Date: 20170112
Implantable Lead Location: 753859
Implantable Lead Location: 753860
Implantable Lead Model: 5076
Implantable Lead Model: 5076
Implantable Pulse Generator Implant Date: 20170112
Lead Channel Impedance Value: 323 Ohm
Lead Channel Impedance Value: 361 Ohm
Lead Channel Impedance Value: 437 Ohm
Lead Channel Impedance Value: 513 Ohm
Lead Channel Pacing Threshold Amplitude: 0.625 V
Lead Channel Pacing Threshold Amplitude: 1.125 V
Lead Channel Pacing Threshold Pulse Width: 0.4 ms
Lead Channel Pacing Threshold Pulse Width: 0.4 ms
Lead Channel Sensing Intrinsic Amplitude: 2.75 mV
Lead Channel Sensing Intrinsic Amplitude: 2.75 mV
Lead Channel Sensing Intrinsic Amplitude: 22.375 mV
Lead Channel Sensing Intrinsic Amplitude: 22.375 mV
Lead Channel Setting Pacing Amplitude: 2 V
Lead Channel Setting Pacing Amplitude: 2.5 V
Lead Channel Setting Pacing Pulse Width: 0.4 ms
Lead Channel Setting Sensing Sensitivity: 5.6 mV
Zone Setting Status: 755011
Zone Setting Status: 755011

## 2024-03-16 ENCOUNTER — Encounter: Payer: Self-pay | Admitting: Internal Medicine

## 2024-03-23 NOTE — Progress Notes (Signed)
 Remote PPM Transmission

## 2024-05-14 ENCOUNTER — Ambulatory Visit: Payer: Self-pay | Admitting: Student in an Organized Health Care Education/Training Program

## 2024-07-05 ENCOUNTER — Ambulatory Visit: Payer: PRIVATE HEALTH INSURANCE | Admitting: Neurology

## 2024-07-05 ENCOUNTER — Encounter: Payer: Self-pay | Admitting: Neurology

## 2024-07-05 VITALS — BP 114/72 | HR 84 | Ht 70.5 in | Wt 129.5 lb

## 2024-07-05 DIAGNOSIS — F32A Depression, unspecified: Secondary | ICD-10-CM

## 2024-07-05 DIAGNOSIS — I639 Cerebral infarction, unspecified: Secondary | ICD-10-CM

## 2024-07-05 DIAGNOSIS — Z79899 Other long term (current) drug therapy: Secondary | ICD-10-CM | POA: Diagnosis not present

## 2024-07-05 DIAGNOSIS — R269 Unspecified abnormalities of gait and mobility: Secondary | ICD-10-CM | POA: Diagnosis not present

## 2024-07-05 DIAGNOSIS — G35C1 Active secondary progressive multiple sclerosis: Secondary | ICD-10-CM | POA: Diagnosis not present

## 2024-07-05 DIAGNOSIS — I442 Atrioventricular block, complete: Secondary | ICD-10-CM | POA: Diagnosis not present

## 2024-07-05 NOTE — Progress Notes (Signed)
 "  [facility]  PATIENT: Rodney Adkins DOB: 25-Dec-1955  REFERRING DOCTOR OR PCP: Lynwood Come, MD SOURCE: Patient, notes from primary care, notes from [facility], imaging and lab reports, multiple MRI images personally reviewed and summarized below.  _________________________________   HISTORICAL  CHIEF COMPLAINT:  Chief Complaint  Patient presents with   Multiple Sclerosis    Rm10, alone, Ms follow up     HISTORY OF PRESENT ILLNESS:  Rodney Adkins is a 68 y.o. man with a relapsing form of SPMS  Update 07/05/2024: Since earlier this year he notes more difficulty with his right hand and worsening balance.     He is on Bafiertam and tolerates it well.  Currently, he has difficulty with gait, mostly due to right sided weakness and spasticity in the leg.  Due to the right weakness and reduced balance he uses walking sticks.   He tries to exercise most days.  His right hand has poor fine motor use.  SABRA  He has some left hand numbness,  No painful tingling.      He has had bladder issues since robotic prostatectomy and still has urinary hesitancy.  Flomax had not helped in past.        Vision worsened after his stroke and unable to read small print.  Speech is mildly slurred and softer since the stroke.   He denies aphasia.   He denies other cognitive issues.   He has mild depression but refers not to take prescribed antidepressant.   Wellbutrin  helped mood years ago.    He falls asleep well but has nocturia x 3 and sometimes has trouble falling back asleep.    He has fatigue.   He sleeps many hours and takes naps as well.   He snores loudly and has had HST showing only AHI=2.    He has a pacemaker for complete heart block.   He was going to have an MRI a couple weeks ago but they were unable to definitely place the pacemaker in safe mode for MRI and it was cancelled.    He sees cardiology regularly and may need a new pacemaker   MS History He is a  68 year old man who was diagnosed with MS in 1998 (Dr. Reyes) after he developed numbness in the hands and face.  MRI of the brain showed abnormalities consistent with MS.  He was initially placed on Avonex but discontinued due to depression.  He had depression and skin reactions on Betaseron when tried a couple years later.  He stopped Betaseron 2006 and for a while was on intermittent IV Solu-Medrol.  He was briefly on Copaxone.   In 2014 he had an MS exacerbation with left-sided numbness.  In 2017 he had increased imbalance and and was started on Tecfidera.  He stopped Tecfidera in 2020 due to insurance issues..  At some point he was placed on Tecfidera.  He had a stroke November 2021 with right-sided symptoms and reduced speech.  More recently he was seen Dr. Rachelle in [place] and was placed on Bafiertam and had a single visit with Dr. Abou Zeid earlier 2025.    His last exacerbation was 2011 with numbess/tingling on on side of his body and he received IV Solumedrol.        He was switched from Tecfidera to [place] 4 years ago due to insurance.     Stroke History: He had a stroke in 2021 causing right sided weakness and worsening of gait.  He did not need a cane before the stroke but has eeded waking sticks since.   The stroke also affected speech and vision.  He was placed on a statin and ASA. He takes a Vit D supp (level was 34 July 2024)  Imaging: MRI of the brain 02/12/2021 showed T2/FLAIR hyperintense foci in the periventricular, juxtacortical deep white matter of the cerebral hemispheres.  There is wallerian degeneration noted along the left corticospinal tract entering the left cerebral peduncle.  A couple small foci noted in the pons.  None of the foci appear to be acute.  No changes compared to the MRI from 05/15/2020.  MRI of the brain 05/16/2020 showed a left posterior frontal infarction in the corona radiata.  MR angiogram 05/16/2020 was normal  MRI cervical spine 05/16/2020 showed  a focus at C4 and possibly another focus adjacent to C2    REVIEW OF SYSTEMS: Constitutional: No fevers, chills, sweats, or change in appetite Eyes: No visual changes, double vision, eye pain Ear, nose and throat: No hearing loss, ear pain, nasal congestion, sore throat Cardiovascular: No chest pain, palpitations Respiratory:  No shortness of breath at rest or with exertion.   No wheezes GastrointestinaI: No nausea, vomiting, diarrhea, abdominal pain, fecal incontinence Genitourinary:  No dysuria, urinary retention or frequency.  No nocturia. Musculoskeletal:  No neck pain, back pain Integumentary: No rash, pruritus, skin lesions Neurological: as above Psychiatric: No depression at this time.  No anxiety Endocrine: No palpitations, diaphoresis, change in appetite, change in weigh or increased thirst Hematologic/Lymphatic:  No anemia, purpura, petechiae. Allergic/Immunologic: No itchy/runny eyes, nasal congestion, recent allergic reactions, rashes  ALLERGIES: No Known Allergies  HOME MEDICATIONS:  Current Outpatient Medications:    aspirin  81 MG EC tablet, Take 1 tablet by mouth daily., Disp: , Rfl:    BAFIERTAM 95 MG CPDR, Take 95 mg by mouth in the morning and at bedtime. , Disp: , Rfl:    buPROPion  (WELLBUTRIN  XL) 150 MG 24 hr tablet, Take 1 tablet (150 mg total) by mouth daily., Disp: 90 tablet, Rfl: 3   Multiple Vitamin (MULTIVITAMIN) tablet, Take 1 tablet by mouth daily., Disp: , Rfl:    rosuvastatin (CRESTOR) 20 MG tablet, Take 20 mg by mouth at bedtime., Disp: , Rfl:   PAST MEDICAL HISTORY: Past Medical History:  Diagnosis Date   Complete heart block [facility]) June 2009   a. Presented with hypotension/pulm edema 12/2007 when HOCM was also discovered. s/p dual chamber MDT pacemaker. Thallium stest 7/09 negative for infiltrative disease or ischemia.  Followed Dr Fernande.   Heart murmur    HOCM (hypertrophic obstructive cardiomyopathy) (HCC) 2009   Discovered by ECHO when he  developed CHB. Mgmt Dr Fernande  and Dr Cesario at [facility]. Has outflow obstruction on ECHO 2011   Hx of adenomatous colonic polyps 11/27/2021   Multiple sclerosis 1998   Sxs started 1998 with L body numbness. MRI c/w MS. Started Avonex. Saw Dr Juliane at [facility] and switched to Betaseron and was on for 5 yrs but developed depression and site rxns and D/C'd 2006. While on Betaseron, occassionally required solumedrol for flares.   Pacemaker 2017   Prostate enlargement    Right bundle branch block    SOB (shortness of breath)    a. PFTs 09/2010 essentially normal per pulm note (mild obst defect). b. 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   Wenckebach    a. Decreased ex tolerance 2010/2012 - underwent stress echo to look @ effects of MV/LVOT -  nothing noted; although pt had high rate Wenckebach behavior with associated ex intol. Pacer reprogrammed with improvement in sx.    PAST SURGICAL HISTORY: Past Surgical History:  Procedure Laterality Date   LEFT HEART CATHETERIZATION WITH CORONARY ANGIOGRAM N/A 08/22/2013   Procedure: LEFT HEART CATHETERIZATION WITH CORONARY ANGIOGRAM;  Surgeon: Rodney LELON Claudene Jarrod, MD;  Location: [facility] CATH LAB;  Service: Cardiovascular;  Laterality: N/A;   LEG TENDON SURGERY  2008   Os peroneus resection and peroneal tendon repair   pace maker     PACEMAKER INSERTION  12/2007   Complete heart block   PACEMAKER LEAD REMOVAL Left 07/18/2015   Procedure: ATRIAL PACEMAKER LEAD EXTRACTION; ATRIAL PACEMAKER LEAD INSERTION; GENERATOR REPLACEMENT.;  Surgeon: Danelle LELON Birmingham, MD;  Location: [facility] OR;  Service: Cardiovascular;  Laterality: Left;  Rodney Adkins to back up   PERIPHERAL VASCULAR CATHETERIZATION N/A 02/12/2015   Procedure: Venogram;  Surgeon: Elspeth JAYSON Sage, MD;  Location: [facility] INVASIVE CV LAB;  Service: Cardiovascular;  Laterality: N/A;   TEE WITHOUT CARDIOVERSION N/A 07/18/2015   Procedure: TRANSESOPHAGEAL ECHOCARDIOGRAM (TEE);  Surgeon: Danelle LELON Birmingham, MD;  Location: [facility] OR;  Service:  Cardiovascular;  Laterality: N/A;    FAMILY HISTORY: Family History  Problem Relation Age of Onset   Colon polyps Father    Hypertension Father    Diabetes Paternal Grandmother    Heart disease Neg Hx    Colon cancer Neg Hx    Stomach cancer Neg Hx    Esophageal cancer Neg Hx    Rectal cancer Neg Hx    Multiple sclerosis Neg Hx     SOCIAL HISTORY: Social History   Socioeconomic History   Marital status: Married    Spouse name: Not on file   Number of children: 0   Years of education: Not on file   Highest education level: Not on file  Occupational History   Occupation: professor    Employer: [employer]    Comment: Statistics  Tobacco Use   Smoking status: Never   Smokeless tobacco: Never  Vaping Use   Vaping status: Never Used  Substance and Sexual Activity   Alcohol use: No    Alcohol/week: 0.0 standard drinks of alcohol   Drug use: No   Sexual activity: Not Currently  Other Topics Concern   Not on file  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   Social Drivers of Health   Tobacco Use: Low Risk (07/05/2024)   Patient History    Smoking Tobacco Use: Never    Smokeless Tobacco Use: Never    Passive Exposure: Not on file  Financial Resource Strain: Not on file  Food Insecurity: Not on file  Transportation Needs: Not on file  Physical Activity: Not on file  Stress: Not on file  Social Connections: Not on file  Intimate Partner Violence: Not on file  Depression (EYV7-0): Not on file  Alcohol Screen: Not on file  Housing: Not on file  Utilities: Not on file  Health Literacy: Not on file       PHYSICAL EXAM  Vitals:   07/05/24 1005  BP: 114/72  Pulse: 84  Weight: 129 lb 8 oz (58.7 kg)  Height: 5' 10.5 (1.791 m)     Body mass index is 18.32 kg/m.   General: The patient is well-developed and well-nourished and in no acute distress  HEENT:  Head is [place]/AT.  Sclera are anicteric.    Neck: good  ROM  Cardiovascular: The heart has  a regular rate and rhythm with a normal S1 and S2. There were no murmurs, gallops or rubs.    Skin: Extremities are without rash or  edema.   Neurologic Exam  Mental status: The patient is alert and oriented x 3 at the time of the examination. The patient has apparent normal recent and remote memory, with an apparently normal attention span and concentration ability.   Speech is normal.  Cranial nerves: Extraocular movements are full. Pupils are equal, round, and reactive to light and accomodation.  Visual fields are full.  Facial symmetry is present. There is good facial sensation to soft touch bilaterally.Facial strength is normal.  Trapezius and sternocleidomastoid strength is normal. His voice is hoarse.  The tongue is midline, and the patient has symmetric elevation of the soft palate. No obvious hearing deficits are noted.   Motor:  Muscle bulk is normal.   Tone is slightly increased I right leg. Strength is  4+/5 in right triceps and intrinsic hand muscles in right arm and 4+/5 right iliopsoas and toe extension in right leg.   5 / 5 in left extremities.    RAM is reduced worse than expected for strength  Sensory: Sensory testing is intact to pinprick, soft touch and vibration sensation in all 4 extremities.  Coordination: Cerebellar testing reveals good finger-nose-finger and heel-to-shin bilaterally.  Gait and station: Station is normal.   Gait is right spastic.  Tandem is poor.  He can walk without his sticks but safer with them.   . He c. Romberg is negative.   Reflexes: Deep tendon reflexes are symmetric and normal in arms and increased in legs with spread at knees and 2 beats nonsustained clonus in right leg..   Plantar responses are flexor.    DIAGNOSTIC DATA (LABS, IMAGING, TESTING) - I reviewed patient records, labs, notes, testing and imaging myself where available.  Lab Results  Component Value Date   WBC 8.8 05/16/2020   HGB 14.9  05/16/2020   HCT 43.3 05/16/2020   MCV 88.5 05/16/2020   PLT 204 05/16/2020      Component Value Date/Time   NA 141 05/16/2020 0308   K 3.7 05/16/2020 0308   CL 106 05/16/2020 0308   CO2 24 05/16/2020 0308   GLUCOSE 90 05/16/2020 0308   BUN 14 05/16/2020 0308   CREATININE 0.96 05/16/2020 0308   CALCIUM  9.5 05/16/2020 0308   PROT 6.8 05/15/2020 1047   ALBUMIN 4.1 05/15/2020 1047   AST 24 05/15/2020 1047   ALT 21 05/15/2020 1047   ALKPHOS 76 05/15/2020 1047   BILITOT 0.9 05/15/2020 1047   GFRNONAA >60 05/16/2020 0308   GFRAA  12/14/2007 1100    >60        The eGFR has been calculated using the MDRD equation. This calculation has not been validated in all clinical   Lab Results  Component Value Date   CHOL 170 05/16/2020   HDL 49 05/16/2020   LDLCALC 103 (H) 05/16/2020   TRIG 92 05/16/2020   CHOLHDL 3.5 05/16/2020   Lab Results  Component Value Date   HGBA1C 5.7 (H) 05/16/2020   No results found for: VITAMINB12 Lab Results  Component Value Date   TSH 1.81 08/07/2013       ASSESSMENT AND PLAN  Active secondary progressive multiple sclerosis - Plan: CBC with Differential/Platelet, CANCELED: CBC with Differential/Platelet  Cerebrovascular accident (CVA), unspecified mechanism (HCC)  Third degree heart block (HCC)  Gait disturbance  Depression, unspecified depression type  High risk medication use - Plan: CBC with Differential/Platelet   Continue Bafiertam and check CBC/D.   We discussed Mavenclad and some drugs in pipeline for SPMS If he gets a new pacemaker, we could check brain a MRI to assess for breakthrough. Stay active and exercise Take Vit D sup Rtc 6-7 moths, sooner if new or worsenig issues.  This visit is part of a comprehensive longitudinal care medical relationship regarding the patients primary diagnosis of multiple sclerosis and related concerns.    Perlie Stene A. Vear, MD, [facility] 07/05/2024, 10:29 AM Certified in Neurology,  Clinical Neurophysiology, Sleep Medicine and Neuroimaging  [facility] [address], [address] [place], [place] [address] [phone] "

## 2024-07-06 ENCOUNTER — Ambulatory Visit: Payer: Self-pay | Admitting: Neurology

## 2024-07-06 LAB — CBC WITH DIFFERENTIAL/PLATELET
Basophils Absolute: 0.1 x10E3/uL (ref 0.0–0.2)
Basos: 1 %
EOS (ABSOLUTE): 0.1 x10E3/uL (ref 0.0–0.4)
Eos: 2 %
Hematocrit: 44.3 % (ref 37.5–51.0)
Hemoglobin: 14.7 g/dL (ref 13.0–17.7)
Immature Grans (Abs): 0 x10E3/uL (ref 0.0–0.1)
Immature Granulocytes: 0 %
Lymphocytes Absolute: 0.8 x10E3/uL (ref 0.7–3.1)
Lymphs: 13 %
MCH: 32.6 pg (ref 26.6–33.0)
MCHC: 33.2 g/dL (ref 31.5–35.7)
MCV: 98 fL — ABNORMAL HIGH (ref 79–97)
Monocytes Absolute: 0.9 x10E3/uL (ref 0.1–0.9)
Monocytes: 14 %
Neutrophils Absolute: 4.5 x10E3/uL (ref 1.4–7.0)
Neutrophils: 70 %
Platelets: 205 x10E3/uL (ref 150–450)
RBC: 4.51 x10E6/uL (ref 4.14–5.80)
RDW: 11.9 % (ref 11.6–15.4)
WBC: 6.4 x10E3/uL (ref 3.4–10.8)

## 2024-07-11 ENCOUNTER — Encounter: Payer: Self-pay | Admitting: Cardiology

## 2024-07-11 ENCOUNTER — Ambulatory Visit: Payer: PRIVATE HEALTH INSURANCE | Admitting: Cardiology

## 2024-07-11 VITALS — BP 106/64 | HR 85 | Ht 70.0 in | Wt 128.0 lb

## 2024-07-11 DIAGNOSIS — Z950 Presence of cardiac pacemaker: Secondary | ICD-10-CM

## 2024-07-11 DIAGNOSIS — I442 Atrioventricular block, complete: Secondary | ICD-10-CM

## 2024-07-11 LAB — CUP PACEART INCLINIC DEVICE CHECK
Battery Remaining Longevity: 9 mo
Battery Voltage: 2.87 V
Brady Statistic AP VP Percent: 2.1 %
Brady Statistic AP VS Percent: 0 %
Brady Statistic AS VP Percent: 97.85 %
Brady Statistic AS VS Percent: 0.05 %
Brady Statistic RA Percent Paced: 2.1 %
Brady Statistic RV Percent Paced: 99.8 %
Date Time Interrogation Session: 20260106160738
Implantable Lead Connection Status: 753985
Implantable Lead Connection Status: 753985
Implantable Lead Implant Date: 20090608
Implantable Lead Implant Date: 20170112
Implantable Lead Location: 753859
Implantable Lead Location: 753860
Implantable Lead Model: 5076
Implantable Lead Model: 5076
Implantable Pulse Generator Implant Date: 20170112
Lead Channel Impedance Value: 342 Ohm
Lead Channel Impedance Value: 399 Ohm
Lead Channel Impedance Value: 456 Ohm
Lead Channel Impedance Value: 551 Ohm
Lead Channel Pacing Threshold Amplitude: 0.75 V
Lead Channel Pacing Threshold Amplitude: 1 V
Lead Channel Pacing Threshold Pulse Width: 0.4 ms
Lead Channel Pacing Threshold Pulse Width: 0.4 ms
Lead Channel Sensing Intrinsic Amplitude: 3.375 mV
Lead Channel Setting Pacing Amplitude: 2 V
Lead Channel Setting Pacing Amplitude: 2.5 V
Lead Channel Setting Pacing Pulse Width: 0.4 ms
Lead Channel Setting Sensing Sensitivity: 5.6 mV
Zone Setting Status: 755011
Zone Setting Status: 755011

## 2024-07-11 NOTE — Patient Instructions (Signed)
 Medication Instructions:  Your physician recommends that you continue on your current medications as directed. Please refer to the Current Medication list given to you today.  *If you need a refill on your cardiac medications before your next appointment, please call your pharmacy*  Follow-Up: At [facility], you and your health needs are our priority.  As part of our continuing mission to provide you with exceptional heart care, our providers are all part of one team.  This team includes your primary Cardiologist (physician) and Advanced Practice Providers or APPs (Physician Assistants and Nurse Practitioners) who all work together to provide you with the care you need, when you need it.  Your next appointment:   9 months  Provider:   You will see one of the following Advanced Practice Providers on your designated Care Team:   Charlies Arthur, [place] Jodie Passey, PA-C Suzann Riddle, NP Daphne Barrack, NP Artist Pouch, PA-C

## 2024-07-11 NOTE — Progress Notes (Signed)
" °  Electrophysiology Office Note:   Date:  07/11/2024  ID:  Rodney Adkins, Rodney Adkins 1956-05-05, MRN [id number]  Primary Cardiologist: None Electrophysiologist: Fonda Kitty, MD      History of Present Illness:    Discussed the use of AI scribe software for clinical note transcription with the patient, who gave verbal consent to proceed.  History of Present Illness Rodney Adkins is a 69 year old male with a pacemaker who presents for follow-up on his pacemaker and defibrillator. He was referred by Dr. Fernande for follow-up on his pacemaker and defibrillator.  He experienced a stroke in the left ventricular region around Thanksgiving 2021. During hospitalization, it was initially unclear whether it was a stroke or an MS exacerbation, requiring multiple MRIs to differentiate. Since the stroke, he has experienced significant weight loss, which may have contributed to the MRI-related issues with his pacemaker.  He reports feeling relatively well. No new or acute complaints today.    Review of systems complete and found to be negative unless listed in HPI.   EP Information / Studies Reviewed:        Echo 05/2020:  1. Left ventricular ejection fraction, by estimation, is 60 to 65%. The  left ventricle has normal function. The left ventricle has no regional  wall motion abnormalities. There is moderate focal basal septal left  ventricular hypertrophy. Peak LV outflow  tract gradient 39 mmHg with Valsalva. There was mitral valve systolic  anterior motion seen best in the apical views. Left ventricular diastolic  parameters are consistent with Grade I diastolic dysfunction (impaired  relaxation).   2. Right ventricular systolic function is normal. The right ventricular  size is normal. There is normal pulmonary artery systolic pressure. The  estimated right ventricular systolic pressure is 19.6 mmHg.   3. The aortic valve is tricuspid. Aortic valve regurgitation is not  visualized. No aortic  stenosis is present.   4. The mitral valve is abnormal with systolic anterior motion noted.  Trivial mitral valve regurgitation. No evidence of mitral stenosis.   5. The inferior vena cava is normal in size with greater than 50%  respiratory variability, suggesting right atrial pressure of 3 mmHg.   6. Findings consistent with hypertrophic obstructive cardiomyopathy.   Physical Exam:   VS:  BP 106/64   Pulse 85   Ht 5' 10 (1.778 m)   Wt 128 lb (58.1 kg)   SpO2 97%   BMI 18.37 kg/m    Wt Readings from Last 3 Encounters:  07/11/24 128 lb (58.1 kg)  07/05/24 129 lb 8 oz (58.7 kg)  12/09/23 132 lb (59.9 kg)     General: Well developed, in no acute distress.  Neck: No JVD.  Cardiac: Normal rate, regular rhythm. Well healed left chest pacer pocket. Resp: Normal work of breathing.  Ext: No edema.  Neuro: No gross focal deficits.  Psych: Normal affect.    ASSESSMENT AND PLAN:    Assessment & Plan Complete heart block s/p PPM: Pacemaker functioning well with no arrhythmias. Leads and histograms are normal. Battery life is approximately nine months. Patient reports history of MRI incompatibility. - Continue current pacemaker management. - Monitor pacemaker function every three months with remote checks. - Will plan for pacemaker battery replacement in approximately nine months. - Will ensure new pacemaker is Medtronic to maintain compatibility.   Follow up with Dr. Kitty in 9 months.   Signed, Fonda Kitty, MD  "

## 2024-07-12 ENCOUNTER — Ambulatory Visit: Attending: Cardiology

## 2024-07-12 DIAGNOSIS — I442 Atrioventricular block, complete: Secondary | ICD-10-CM | POA: Diagnosis not present

## 2024-07-13 LAB — CUP PACEART REMOTE DEVICE CHECK
Battery Remaining Longevity: 9 mo
Battery Voltage: 2.87 V
Brady Statistic AP VP Percent: 0.06 %
Brady Statistic AP VS Percent: 0 %
Brady Statistic AS VP Percent: 99.93 %
Brady Statistic AS VS Percent: 0.01 %
Brady Statistic RA Percent Paced: 0.06 %
Brady Statistic RV Percent Paced: 99.93 %
Date Time Interrogation Session: 20260107230022
Implantable Lead Connection Status: 753985
Implantable Lead Connection Status: 753985
Implantable Lead Implant Date: 20090608
Implantable Lead Implant Date: 20170112
Implantable Lead Location: 753859
Implantable Lead Location: 753860
Implantable Lead Model: 5076
Implantable Lead Model: 5076
Implantable Pulse Generator Implant Date: 20170112
Lead Channel Impedance Value: 323 Ohm
Lead Channel Impedance Value: 380 Ohm
Lead Channel Impedance Value: 437 Ohm
Lead Channel Impedance Value: 513 Ohm
Lead Channel Pacing Threshold Amplitude: 0.625 V
Lead Channel Pacing Threshold Amplitude: 1 V
Lead Channel Pacing Threshold Pulse Width: 0.4 ms
Lead Channel Pacing Threshold Pulse Width: 0.4 ms
Lead Channel Sensing Intrinsic Amplitude: 2.625 mV
Lead Channel Sensing Intrinsic Amplitude: 2.625 mV
Lead Channel Sensing Intrinsic Amplitude: 20.875 mV
Lead Channel Sensing Intrinsic Amplitude: 20.875 mV
Lead Channel Setting Pacing Amplitude: 2 V
Lead Channel Setting Pacing Amplitude: 2.5 V
Lead Channel Setting Pacing Pulse Width: 0.4 ms
Lead Channel Setting Sensing Sensitivity: 5.6 mV
Zone Setting Status: 755011
Zone Setting Status: 755011

## 2024-07-15 ENCOUNTER — Ambulatory Visit: Payer: Self-pay | Admitting: Cardiology

## 2024-07-17 NOTE — Progress Notes (Signed)
 Remote PPM Transmission

## 2024-10-11 ENCOUNTER — Ambulatory Visit

## 2025-01-10 ENCOUNTER — Ambulatory Visit

## 2025-03-08 ENCOUNTER — Ambulatory Visit: Payer: PRIVATE HEALTH INSURANCE | Admitting: Neurology

## 2025-04-11 ENCOUNTER — Ambulatory Visit
